# Patient Record
Sex: Male | Born: 1950 | Race: White | Hispanic: No | Smoking: Current every day smoker
Health system: Southern US, Community
[De-identification: ages and names within clinical notes are randomized; demographics above are authoritative.]

## PROBLEM LIST (undated history)

## (undated) DIAGNOSIS — D649 Anemia, unspecified: Secondary | ICD-10-CM

## (undated) DIAGNOSIS — J449 Chronic obstructive pulmonary disease, unspecified: Secondary | ICD-10-CM

## (undated) DIAGNOSIS — I313 Pericardial effusion (noninflammatory): Secondary | ICD-10-CM

## (undated) DIAGNOSIS — Z950 Presence of cardiac pacemaker: Secondary | ICD-10-CM

## (undated) DIAGNOSIS — I10 Essential (primary) hypertension: Secondary | ICD-10-CM

## (undated) DIAGNOSIS — I441 Atrioventricular block, second degree: Secondary | ICD-10-CM

## (undated) DIAGNOSIS — F1090 Alcohol use, unspecified, uncomplicated: Secondary | ICD-10-CM

## (undated) DIAGNOSIS — Z7289 Other problems related to lifestyle: Secondary | ICD-10-CM

## (undated) DIAGNOSIS — I4819 Other persistent atrial fibrillation: Secondary | ICD-10-CM

## (undated) DIAGNOSIS — R0600 Dyspnea, unspecified: Secondary | ICD-10-CM

## (undated) DIAGNOSIS — N182 Chronic kidney disease, stage 2 (mild): Secondary | ICD-10-CM

## (undated) DIAGNOSIS — Z720 Tobacco use: Secondary | ICD-10-CM

## (undated) DIAGNOSIS — I4892 Unspecified atrial flutter: Secondary | ICD-10-CM

## (undated) DIAGNOSIS — I3139 Other pericardial effusion (noninflammatory): Secondary | ICD-10-CM

## (undated) DIAGNOSIS — I5042 Chronic combined systolic (congestive) and diastolic (congestive) heart failure: Secondary | ICD-10-CM

## (undated) DIAGNOSIS — K259 Gastric ulcer, unspecified as acute or chronic, without hemorrhage or perforation: Secondary | ICD-10-CM

## (undated) DIAGNOSIS — K922 Gastrointestinal hemorrhage, unspecified: Secondary | ICD-10-CM

## (undated) DIAGNOSIS — R911 Solitary pulmonary nodule: Secondary | ICD-10-CM

## (undated) DIAGNOSIS — C801 Malignant (primary) neoplasm, unspecified: Secondary | ICD-10-CM

## (undated) DIAGNOSIS — Z789 Other specified health status: Secondary | ICD-10-CM

## (undated) DIAGNOSIS — R319 Hematuria, unspecified: Secondary | ICD-10-CM

## (undated) DIAGNOSIS — I428 Other cardiomyopathies: Secondary | ICD-10-CM

## (undated) HISTORY — DX: Tobacco use: Z72.0

## (undated) HISTORY — DX: Other problems related to lifestyle: Z72.89

## (undated) HISTORY — DX: Other specified health status: Z78.9

## (undated) HISTORY — DX: Other cardiomyopathies: I42.8

## (undated) HISTORY — DX: Other persistent atrial fibrillation: I48.19

## (undated) HISTORY — DX: Chronic kidney disease, stage 2 (mild): N18.2

## (undated) HISTORY — DX: Gastric ulcer, unspecified as acute or chronic, without hemorrhage or perforation: K25.9

## (undated) HISTORY — DX: Gastrointestinal hemorrhage, unspecified: K92.2

## (undated) HISTORY — DX: Hematuria, unspecified: R31.9

## (undated) HISTORY — DX: Unspecified atrial flutter: I48.92

## (undated) HISTORY — DX: Alcohol use, unspecified, uncomplicated: F10.90

## (undated) HISTORY — DX: Chronic combined systolic (congestive) and diastolic (congestive) heart failure: I50.42

## (undated) HISTORY — PX: TONSILLECTOMY: SUR1361

---

## 2002-08-10 ENCOUNTER — Encounter: Payer: Self-pay | Admitting: Family Medicine

## 2002-08-10 ENCOUNTER — Ambulatory Visit (HOSPITAL_COMMUNITY): Admission: RE | Admit: 2002-08-10 | Discharge: 2002-08-10 | Payer: Self-pay | Admitting: Family Medicine

## 2004-02-18 ENCOUNTER — Ambulatory Visit (HOSPITAL_COMMUNITY): Admission: RE | Admit: 2004-02-18 | Discharge: 2004-02-18 | Payer: Self-pay | Admitting: General Surgery

## 2004-02-20 ENCOUNTER — Ambulatory Visit (HOSPITAL_COMMUNITY): Admission: RE | Admit: 2004-02-20 | Discharge: 2004-02-20 | Payer: Self-pay | Admitting: Family Medicine

## 2005-06-16 ENCOUNTER — Inpatient Hospital Stay (HOSPITAL_COMMUNITY): Admission: EM | Admit: 2005-06-16 | Discharge: 2005-06-18 | Payer: Self-pay | Admitting: Emergency Medicine

## 2006-05-25 ENCOUNTER — Ambulatory Visit (HOSPITAL_COMMUNITY): Admission: RE | Admit: 2006-05-25 | Discharge: 2006-05-25 | Payer: Self-pay | Admitting: Family Medicine

## 2006-09-26 ENCOUNTER — Ambulatory Visit (HOSPITAL_COMMUNITY): Admission: RE | Admit: 2006-09-26 | Discharge: 2006-09-26 | Payer: Self-pay | Admitting: Family Medicine

## 2007-03-15 ENCOUNTER — Ambulatory Visit (HOSPITAL_COMMUNITY): Admission: RE | Admit: 2007-03-15 | Discharge: 2007-03-15 | Payer: Self-pay | Admitting: Family Medicine

## 2008-03-14 ENCOUNTER — Ambulatory Visit (HOSPITAL_COMMUNITY): Admission: RE | Admit: 2008-03-14 | Discharge: 2008-03-14 | Payer: Self-pay | Admitting: Family Medicine

## 2008-04-26 ENCOUNTER — Ambulatory Visit (HOSPITAL_COMMUNITY): Admission: RE | Admit: 2008-04-26 | Discharge: 2008-04-26 | Payer: Self-pay | Admitting: Family Medicine

## 2008-05-09 ENCOUNTER — Encounter: Admission: RE | Admit: 2008-05-09 | Discharge: 2008-05-09 | Payer: Self-pay | Admitting: Family Medicine

## 2008-05-23 ENCOUNTER — Encounter: Admission: RE | Admit: 2008-05-23 | Discharge: 2008-05-23 | Payer: Self-pay | Admitting: Family Medicine

## 2011-02-04 ENCOUNTER — Other Ambulatory Visit (HOSPITAL_COMMUNITY): Payer: Self-pay | Admitting: Urology

## 2011-02-04 ENCOUNTER — Ambulatory Visit (HOSPITAL_COMMUNITY)
Admission: RE | Admit: 2011-02-04 | Discharge: 2011-02-04 | Disposition: A | Discharge status: Home / Self Care | Payer: Self-pay | Source: Ambulatory Visit | Attending: Urology | Admitting: Urology

## 2011-02-04 DIAGNOSIS — Z01818 Encounter for other preprocedural examination: Secondary | ICD-10-CM

## 2011-04-23 NOTE — Consult Note (Signed)
NAME:  Shaun May, Shaun May NO.:  [id number]   MEDICAL RECORD NO.:  [id number]          PATIENT TYPE:  INP   LOCATION:  A213                          FACILITY:  [facility]   PHYSICIAN:  Jorja Loa, M.D.DATE OF BIRTH:  Apr 24, 1951   DATE OF CONSULTATION:  06/17/2005  DATE OF DISCHARGE:                                   CONSULTATION   REASON FOR CONSULTATION:  Renal insufficiency.   HISTORY OF PRESENT ILLNESS:  Mr. Upshaw is a 60 year old, Caucasian male who  has a history of hemochromatosis, history of hypertension and history of  abdominal pain who presented with complaints of nausea, vomiting, right-  sided abdominal pain and diarrhea.  During the admission, the patient's BUN  and creatinine has been elevated, hence, consult is called.  The patient  denies any previous history of renal insufficiency.  No history of kidney  stones.  As stated above, the patient with history of nausea, vomiting,  decreased appetite, some abdominal pain and diarrhea.   PAST MEDICAL HISTORY:  1.  History of hemochromatosis.  2.  History of hypertension.  3.  History of anxiety disorder.  4.  History of enlarged prostate.   SOCIAL HISTORY:  The patient smokes about two packs per day.  No history of  alcohol use.   ALLERGIES:  No known drug allergies.   MEDICATIONS:  1.  Levaquin 500 mg IV q.24h.  2.  Ativan 2 mg p.o. four times a day.  3.  Protonix 40 mg IV q.12h.  4.  IV fluid at 200 cc per hour.  5.  Dilaudid.   REVIEW OF SYSTEMS:  GASTROINTESTINAL:  This morning she feels okay.  The  patient with history of nausea, vomiting, but feels better now.  He also  complains of right-sided abdominal pain.  He has some constipation, but  starting yesterday, had some diarrhea.  Today, he has some loose stools.   PHYSICAL EXAMINATION:  GENERAL:  Alert and in no apparent distress.  VITAL SIGNS:  Temperature 98.1, pulse 66, blood pressure 86/46.  HEENT:  No conjunctivae or pallor.   Nonicteric.  Oral mucosa seems to be  dry.  NECK:  Supple.  No JVD.  CHEST:  Decreased breath sounds, otherwise seems to be clear.  No rales or  rhonchi.  No egophony.  HEART:  Regular rate and rhythm, no murmur.  ABDOMEN:  Soft, positive bowel sounds.  EXTREMITIES:  No edema.   LABORATORY DATA AND X-RAY FINDINGS:  White blood cell count 18.2, hemoglobin  14.8, hematocrit 42.4, platelets of 195.  Sodium 137, potassium 3.7, BUN 40,  creatinine 3.7.  Total bilirubin is 2.6, bilirubin 0.4, indirect bilirubin  2.2, SGOT 44, SGPT 45, albumin 4.1, calcium 8.1.  UA with specific gravity  less than 1.01.  At this moment, everything is negative.  Culture seems to  be pending.  Blood culture from yesterday with no growth.   ASSESSMENT:  1.  Renal insufficiency.  At this moment, it seems to be acute.  Etiology      for renal insufficiency could be secondary to prerenal  syndrome,      however, acute tubular necrosis and other etiologies cannot be ruled      out.  2.  History of hemochromatosis.  He has had this problem for some time.      Hemoglobin and hematocrit seems to be reasonable.  3.  Elevated liver function tests probably associated with hemochromatosis,      i.e. secondary to iron load, but he does not have any diabetes or any      other signs.  Other etiologies are still possible.  4.  History of abdominal side, right-sided, may be related to his liver.  5.  Hypotension.  At this moment, probably in view of high white blood cell      count, the patient may have infection.  The source is not clear, but      gastrointestinal source needs to probably entertained.   RECOMMENDATIONS:  I agree with hydration in this woman.  Will continue with  present management.  If patient does not have any ultrasounds, we probably  will need to do an ultrasound of the kidneys to rule out obstruction.  Will  continue other medications as before and will follow patient.       BB/MEDQ  D:  06/17/2005   T:  06/17/2005  Job:  [id number]

## 2011-04-23 NOTE — H&P (Signed)
NAME:  Shaun May, Shaun May NO.:  [id number]   MEDICAL RECORD NO.:  [id number]                  PATIENT TYPE:   LOCATION:                                       FACILITY:   PHYSICIAN:  Dalia Heading, M.D.               DATE OF BIRTH:  Oct 27, 1951   DATE OF ADMISSION:  DATE OF DISCHARGE:                                HISTORY & PHYSICAL   CHIEF COMPLAINT:  Right-sided abdominal pain, need for screening  colonoscopy.   HISTORY OF PRESENT ILLNESS:  The patient is a 60 year old white male who is  referred for endoscopic evaluation.  He needs colonoscopy for screening and  for right-side abdominal pain.  No weight loss, nausea, vomiting, diarrhea,  constipation, melena, or hematochezia have been noted.  He has never had a  colonoscopy.  There is no family history of colon carcinoma.   PAST MEDICAL HISTORY:  Includes hypertension.   PAST SURGICAL HISTORY:  Tonsillectomy, prostate biopsy.   CURRENT MEDICATIONS:  Ziac, Advair.   ALLERGIES:  No known drug allergies.   REVIEW OF SYSTEMS:  The patient smokes two packs of cigarettes a day.  He  drinks alcohol daily.  No other cardiopulmonary difficulties are noted.   PHYSICAL EXAMINATION:  GENERAL:  The patient is a well-developed, well-  nourished white male in no acute distress.  VITAL SIGNS:  He is afebrile and vital signs are stable.  LUNGS:  Clear to auscultation with equal breath sounds bilaterally.  HEART:  Reveals a respiratory rate without S3, S4, or murmurs.  ABDOMEN:  Soft, nontender, nondistended.  No hepatosplenomegaly, masses, or  hernias are identified.  RECTAL:  Deferred to the procedure.   IMPRESSION:  Abdominal pain, need for screening colonoscopy.   PLAN:  The patient is scheduled for a colonoscopy on February 18, 2004.  The  risks and benefits of the procedure including bleeding and perforation were  fully explained to the patient who gave informed consent.      ___________________________________________                                         Dalia Heading, M.D.   MAJ/MEDQ  D:  02/06/2004  T:  02/06/2004  Job:  [id number]   cc:   Patrica Duel, M.D.  [address]  [place]  [place] [address]  Fax: [phone]

## 2011-04-23 NOTE — Discharge Summary (Signed)
NAME:  TEMPLE, SPORER NO.:  [id number]   MEDICAL RECORD NO.:  [id number]          PATIENT TYPE:  INP   LOCATION:  A213                          FACILITY:  [facility]   PHYSICIAN:  Madelin Rear. Sherwood Gambler, MD  DATE OF BIRTH:  1951/09/18   DATE OF ADMISSION:  06/16/2005  DATE OF DISCHARGE:  07/14/2006LH                                 DISCHARGE SUMMARY   DISCHARGE DIAGNOSES:  1.  Gastroenteritis.  2.  Dehydration.  3.  Hemochromatosis.  4.  Chronic renal insufficiency.   DISCHARGE MEDICATIONS:  1.  Lopressor 25 mg p.o. b.i.d.  2.  Protonix 40 mg p.o. b.i.d.  3.  Ativan p.r.n. 2 mg t.i.d.   HOSPITAL COURSE:  The patient was admitted with intractable vomiting,  hypotension and dehydration.  He responded well clinically and by physical  examination with IV hydration.  He was seen in consultation by  gastroenterology as well as renal for mild renal insufficiency.  Workup  included a CT scan which showed no evidence of obstruction or biliary  obstruction.  Discharged asymptomatic for followup in office.       LJF/MEDQ  D:  07/10/2005  T:  07/10/2005  Job:  [id number]

## 2011-04-23 NOTE — H&P (Signed)
NAME:  Shaun May, BACHA NO.:  [id number]   MEDICAL RECORD NO.:  [id number]          PATIENT TYPE:  INP   LOCATION:  A213                          FACILITY:  [facility]   PHYSICIAN:  Madelin Rear. Sherwood Gambler, MD  DATE OF BIRTH:  August 05, 1951   DATE OF ADMISSION:  06/16/2005  DATE OF DISCHARGE:  LH                                HISTORY & PHYSICAL   CHIEF COMPLAINT:  Abdominal pain, diarrhea.   HISTORY OF PRESENT ILLNESS:  The patient had several days of progressively  increasing diarrhea, nagging abdominal pain, although not severe, and  emesis.  He denied any hematemesis, hematochezia or melena.  He had no fever  or regular chills.  He has had episodes of some orthostatic dizziness, and  flushed sensation, but no rigors and no fever.   PAST MEDICAL HISTORY:  1.  Hemachromatosis, chronically phlebotomized every two months.  2.  Status post colonoscopy recently with Dr. Lovell Sheehan that he reports to me      as negative.  He had colonoscopy for screening purposes.  3.  Hypertension, approximately June 03, 2005, he was started on Ace      inhibitor therapy with Enalapril.  He may have had some GI intolerance      to this.   SOCIAL HISTORY:  Married, lives with wife.  No alcohol or other drug use.   FAMILY HISTORY:  Noncontributory.   REVIEW OF SYSTEMS:  Under HPI.  All else negative.   PHYSICAL EXAMINATION:  GENERAL APPEARANCE:  He is noted to be hypotensive  without tachycardia.  He appears conversant and well perfusing, however.  HEENT:  No JVD or adenopathy.  NECK:  Supple.  CHEST:  Clear.  CARDIAC:  Regular rate and rhythm without murmurs, gallops, rubs.  ABDOMEN:  Positive bowel sounds.  No organomegaly or masses.  He does have  subjective tenderness in the right upper quadrant, right mesogastric area  and right lower quadrant.  There is no guarding or rebound, however.  EXTREMITIES:  Without cyanosis, clubbing or edema.  NEUROLOGICAL:  Nonfocal.   LABORATORY DATA:   Obtained upon presentation to the emergency department and  revealed a 25.2 white count with positive left shift.  He was also  polycythemic, presumably secondary to contraction/dehydration of 18.9 g  percent.  Platelet count pumps were noted and counted.  Electrolytes  revealed mild hyponatremia at 131 and mildly elevated BUN at 26 and a new  onset of increased creatinine at 2.6.  His bilirubin was elevated at 2.6,  predominantly and direct.  Alk-phos normal, mild elevation of O2 and PT at  44 and 45.  Serum albumin was normal.  Calcium was normal.  Amylase and  lipase were normal.  Urinalysis was not obtained.  Radiographs, i.e. and  obstruction series, revealed nonspecific gas pattern with possible low grade  early small bowel obstruction not excluded.  No pneumoperitoneum.   IMPRESSION:  1.  Abdominal pain with diarrhea and multiple metabolic abnormalities.  He      is going to require workup of his gallbladder due to the abnormal LFT's.  He has an indirect hyperbilirubinemia that is not explained.  Hepatitis      serology will be obtained.  This may be secondary to his      hemachromatosis.  GI consultation will be sought.  Due to his elevated      creatinine a CT with contrast cannot be obtained, but a plain one will      be.  Renal function will be monitored off ACE inhibitor as a possible      contributing factor.  Will rule out obstructive uropathy as well.  Due      to his elevated white count suggesting pending sepsis, an abdominal pain      will be covered with quinolone and anaerobic coverage with Flagyl      dependent of anatomic studies.  2.  Hypertension.  Obviously not a problem with his blood pressure at      present.  He will be monitored and acutely intervened as necessary.  3.  Dehydration with contraction/secondary polycythemia.  Will obtain an      overnight oximetry, aggressive IV hydration to be pursued.       LJF/MEDQ  D:  06/17/2005  T:  06/17/2005   Job:  [id number]

## 2014-07-03 ENCOUNTER — Ambulatory Visit (HOSPITAL_COMMUNITY)
Admission: RE | Admit: 2014-07-03 | Discharge: 2014-07-03 | Disposition: A | Discharge status: Home / Self Care | Payer: BC Managed Care – PPO | Source: Ambulatory Visit | Attending: Pulmonary Disease | Admitting: Pulmonary Disease

## 2014-07-03 ENCOUNTER — Other Ambulatory Visit (HOSPITAL_COMMUNITY): Payer: Self-pay | Admitting: Pulmonary Disease

## 2014-07-03 DIAGNOSIS — R05 Cough: Secondary | ICD-10-CM

## 2014-07-03 DIAGNOSIS — R059 Cough, unspecified: Secondary | ICD-10-CM

## 2014-09-26 ENCOUNTER — Other Ambulatory Visit (HOSPITAL_COMMUNITY): Payer: Self-pay | Admitting: Pulmonary Disease

## 2014-09-26 DIAGNOSIS — R197 Diarrhea, unspecified: Secondary | ICD-10-CM

## 2014-09-26 DIAGNOSIS — R1011 Right upper quadrant pain: Secondary | ICD-10-CM

## 2014-09-26 DIAGNOSIS — R110 Nausea: Secondary | ICD-10-CM

## 2014-09-30 ENCOUNTER — Ambulatory Visit (HOSPITAL_COMMUNITY)
Admission: RE | Admit: 2014-09-30 | Discharge: 2014-09-30 | Disposition: A | Discharge status: Home / Self Care | Payer: BC Managed Care – PPO | Source: Ambulatory Visit | Attending: Pulmonary Disease | Admitting: Pulmonary Disease

## 2014-09-30 ENCOUNTER — Ambulatory Visit (HOSPITAL_COMMUNITY): Payer: BC Managed Care – PPO

## 2014-09-30 DIAGNOSIS — I700 Atherosclerosis of aorta: Secondary | ICD-10-CM | POA: Insufficient documentation

## 2014-09-30 DIAGNOSIS — R197 Diarrhea, unspecified: Secondary | ICD-10-CM | POA: Diagnosis not present

## 2014-09-30 DIAGNOSIS — G8929 Other chronic pain: Secondary | ICD-10-CM | POA: Diagnosis not present

## 2014-09-30 DIAGNOSIS — R1011 Right upper quadrant pain: Secondary | ICD-10-CM | POA: Insufficient documentation

## 2014-09-30 DIAGNOSIS — K760 Fatty (change of) liver, not elsewhere classified: Secondary | ICD-10-CM | POA: Insufficient documentation

## 2014-09-30 DIAGNOSIS — N281 Cyst of kidney, acquired: Secondary | ICD-10-CM | POA: Insufficient documentation

## 2014-09-30 DIAGNOSIS — R110 Nausea: Secondary | ICD-10-CM | POA: Insufficient documentation

## 2014-09-30 MED ORDER — IOHEXOL 300 MG/ML  SOLN
100.0000 mL | Freq: Once | INTRAMUSCULAR | Status: AC | PRN
  Administered 2014-09-30: 100 mL via INTRAVENOUS

## 2014-09-30 MED ORDER — SODIUM CHLORIDE 0.9 % IJ SOLN
INTRAMUSCULAR | Status: AC
  Filled 2014-09-30: qty 45

## 2014-09-30 MED ORDER — SODIUM CHLORIDE 0.9 % IJ SOLN
INTRAMUSCULAR | Status: AC
  Filled 2014-09-30: qty 500

## 2014-10-17 ENCOUNTER — Ambulatory Visit (INDEPENDENT_AMBULATORY_CARE_PROVIDER_SITE_OTHER): Payer: BC Managed Care – PPO | Admitting: Otolaryngology

## 2014-10-17 DIAGNOSIS — H903 Sensorineural hearing loss, bilateral: Secondary | ICD-10-CM

## 2014-10-17 DIAGNOSIS — H6123 Impacted cerumen, bilateral: Secondary | ICD-10-CM

## 2014-10-17 DIAGNOSIS — H9313 Tinnitus, bilateral: Secondary | ICD-10-CM

## 2014-11-18 ENCOUNTER — Other Ambulatory Visit (HOSPITAL_COMMUNITY): Payer: Self-pay | Admitting: Pulmonary Disease

## 2014-11-18 DIAGNOSIS — K760 Fatty (change of) liver, not elsewhere classified: Secondary | ICD-10-CM

## 2014-11-22 ENCOUNTER — Ambulatory Visit (HOSPITAL_COMMUNITY): Admission: RE | Admit: 2014-11-22 | Payer: BC Managed Care – PPO | Source: Ambulatory Visit

## 2015-10-23 ENCOUNTER — Ambulatory Visit (INDEPENDENT_AMBULATORY_CARE_PROVIDER_SITE_OTHER): Payer: Self-pay | Admitting: Otolaryngology

## 2015-10-25 ENCOUNTER — Other Ambulatory Visit: Payer: Self-pay | Admitting: Cardiology

## 2015-10-25 ENCOUNTER — Emergency Department (HOSPITAL_COMMUNITY): Payer: 59

## 2015-10-25 ENCOUNTER — Encounter (HOSPITAL_COMMUNITY): Payer: Self-pay | Admitting: Emergency Medicine

## 2015-10-25 ENCOUNTER — Inpatient Hospital Stay (HOSPITAL_COMMUNITY)
Admission: EM | Admit: 2015-10-25 | Discharge: 2015-10-29 | DRG: 244 | Disposition: A | Discharge status: Home / Self Care | Payer: 59 | Attending: Internal Medicine | Admitting: Internal Medicine

## 2015-10-25 DIAGNOSIS — Z720 Tobacco use: Secondary | ICD-10-CM

## 2015-10-25 DIAGNOSIS — F1020 Alcohol dependence, uncomplicated: Secondary | ICD-10-CM | POA: Diagnosis present

## 2015-10-25 DIAGNOSIS — F17210 Nicotine dependence, cigarettes, uncomplicated: Secondary | ICD-10-CM | POA: Diagnosis present

## 2015-10-25 DIAGNOSIS — I160 Hypertensive urgency: Secondary | ICD-10-CM | POA: Diagnosis not present

## 2015-10-25 DIAGNOSIS — F410 Panic disorder [episodic paroxysmal anxiety] without agoraphobia: Secondary | ICD-10-CM | POA: Diagnosis not present

## 2015-10-25 DIAGNOSIS — I452 Bifascicular block: Secondary | ICD-10-CM | POA: Diagnosis not present

## 2015-10-25 DIAGNOSIS — R001 Bradycardia, unspecified: Secondary | ICD-10-CM | POA: Diagnosis not present

## 2015-10-25 DIAGNOSIS — R42 Dizziness and giddiness: Secondary | ICD-10-CM

## 2015-10-25 DIAGNOSIS — I441 Atrioventricular block, second degree: Secondary | ICD-10-CM | POA: Diagnosis present

## 2015-10-25 DIAGNOSIS — R7989 Other specified abnormal findings of blood chemistry: Secondary | ICD-10-CM | POA: Diagnosis present

## 2015-10-25 DIAGNOSIS — I10 Essential (primary) hypertension: Secondary | ICD-10-CM | POA: Diagnosis not present

## 2015-10-25 DIAGNOSIS — Z950 Presence of cardiac pacemaker: Secondary | ICD-10-CM

## 2015-10-25 HISTORY — DX: Atrioventricular block, second degree: I44.1

## 2015-10-25 HISTORY — DX: Hemochromatosis, unspecified: E83.119

## 2015-10-25 HISTORY — DX: Essential (primary) hypertension: I10

## 2015-10-25 LAB — COMPREHENSIVE METABOLIC PANEL
ALT: 77 U/L — ABNORMAL HIGH (ref 17–63)
ANION GAP: 12 (ref 5–15)
AST: 74 U/L — ABNORMAL HIGH (ref 15–41)
Albumin: 4.3 g/dL (ref 3.5–5.0)
Alkaline Phosphatase: 50 U/L (ref 38–126)
BUN: 12 mg/dL (ref 6–20)
CALCIUM: 9.6 mg/dL (ref 8.9–10.3)
CHLORIDE: 100 mmol/L — AB (ref 101–111)
CO2: 28 mmol/L (ref 22–32)
Creatinine, Ser: 0.7 mg/dL (ref 0.61–1.24)
Glucose, Bld: 100 mg/dL — ABNORMAL HIGH (ref 65–99)
Potassium: 4.2 mmol/L (ref 3.5–5.1)
SODIUM: 140 mmol/L (ref 135–145)
Total Bilirubin: 1.5 mg/dL — ABNORMAL HIGH (ref 0.3–1.2)
Total Protein: 7.8 g/dL (ref 6.5–8.1)

## 2015-10-25 LAB — CBC WITH DIFFERENTIAL/PLATELET
Basophils Absolute: 0 10*3/uL (ref 0.0–0.1)
Basophils Relative: 0 %
EOS ABS: 0.1 10*3/uL (ref 0.0–0.7)
EOS PCT: 2 %
HCT: 46.1 % (ref 39.0–52.0)
Hemoglobin: 15.9 g/dL (ref 13.0–17.0)
LYMPHS ABS: 1.2 10*3/uL (ref 0.7–4.0)
Lymphocytes Relative: 17 %
MCH: 33.5 pg (ref 26.0–34.0)
MCHC: 34.5 g/dL (ref 30.0–36.0)
MCV: 97.1 fL (ref 78.0–100.0)
MONO ABS: 0.4 10*3/uL (ref 0.1–1.0)
MONOS PCT: 5 %
Neutro Abs: 5.4 10*3/uL (ref 1.7–7.7)
Neutrophils Relative %: 76 %
PLATELETS: 229 10*3/uL (ref 150–400)
RBC: 4.75 MIL/uL (ref 4.22–5.81)
RDW: 13 % (ref 11.5–15.5)
WBC: 7.2 10*3/uL (ref 4.0–10.5)

## 2015-10-25 LAB — TROPONIN I

## 2015-10-25 LAB — TSH: TSH: 0.599 u[IU]/mL (ref 0.350–4.500)

## 2015-10-25 LAB — BRAIN NATRIURETIC PEPTIDE: B Natriuretic Peptide: 95 pg/mL (ref 0.0–100.0)

## 2015-10-25 MED ORDER — PNEUMOCOCCAL VAC POLYVALENT 25 MCG/0.5ML IJ INJ
0.5000 mL | INJECTION | INTRAMUSCULAR | Status: DC
  Filled 2015-10-25 (×2): qty 0.5

## 2015-10-25 MED ORDER — NICOTINE 21 MG/24HR TD PT24
21.0000 mg | MEDICATED_PATCH | TRANSDERMAL | Status: DC
  Administered 2015-10-25 – 2015-10-28 (×4): 21 mg via TRANSDERMAL
  Filled 2015-10-25 (×4): qty 1

## 2015-10-25 MED ORDER — POTASSIUM CHLORIDE CRYS ER 20 MEQ PO TBCR
20.0000 meq | EXTENDED_RELEASE_TABLET | Freq: Every day | ORAL | Status: DC
  Administered 2015-10-26 – 2015-10-29 (×4): 20 meq via ORAL
  Filled 2015-10-25 (×4): qty 1

## 2015-10-25 MED ORDER — VITAMIN B-1 100 MG PO TABS
100.0000 mg | ORAL_TABLET | Freq: Every day | ORAL | Status: DC
  Administered 2015-10-25 – 2015-10-29 (×5): 100 mg via ORAL
  Filled 2015-10-25 (×5): qty 1

## 2015-10-25 MED ORDER — AMLODIPINE BESYLATE 10 MG PO TABS
10.0000 mg | ORAL_TABLET | Freq: Every day | ORAL | Status: DC
  Administered 2015-10-26: 10 mg via ORAL
  Filled 2015-10-25: qty 1

## 2015-10-25 MED ORDER — ENOXAPARIN SODIUM 40 MG/0.4ML ~~LOC~~ SOLN
40.0000 mg | SUBCUTANEOUS | Status: DC
  Administered 2015-10-25 – 2015-10-27 (×3): 40 mg via SUBCUTANEOUS
  Filled 2015-10-25 (×3): qty 0.4

## 2015-10-25 MED ORDER — DIPHENHYDRAMINE HCL 25 MG PO CAPS
25.0000 mg | ORAL_CAPSULE | Freq: Once | ORAL | Status: AC
  Administered 2015-10-25: 25 mg via ORAL
  Filled 2015-10-25: qty 1

## 2015-10-25 MED ORDER — ALBUTEROL SULFATE (2.5 MG/3ML) 0.083% IN NEBU: 2.5000 mg | INHALATION_SOLUTION | RESPIRATORY_TRACT | Status: DC | PRN

## 2015-10-25 MED ORDER — LORAZEPAM 1 MG PO TABS: 1.0000 mg | ORAL_TABLET | Freq: Four times a day (QID) | ORAL | Status: AC | PRN

## 2015-10-25 MED ORDER — LORAZEPAM 2 MG/ML IJ SOLN: 1.0000 mg | Freq: Four times a day (QID) | INTRAMUSCULAR | Status: AC | PRN

## 2015-10-25 MED ORDER — LISINOPRIL 40 MG PO TABS
40.0000 mg | ORAL_TABLET | Freq: Every day | ORAL | Status: DC
  Administered 2015-10-26 – 2015-10-29 (×4): 40 mg via ORAL
  Filled 2015-10-25 (×4): qty 1

## 2015-10-25 MED ORDER — SODIUM CHLORIDE 0.9 % IJ SOLN
3.0000 mL | Freq: Two times a day (BID) | INTRAMUSCULAR | Status: DC
  Administered 2015-10-25 – 2015-10-28 (×6): 3 mL via INTRAVENOUS
  Administered 2015-10-28: 10 mL via INTRAVENOUS
  Administered 2015-10-28: 3 mL via INTRAVENOUS

## 2015-10-25 MED ORDER — LORAZEPAM 1 MG PO TABS
2.0000 mg | ORAL_TABLET | Freq: Four times a day (QID) | ORAL | Status: DC
  Administered 2015-10-25 – 2015-10-29 (×13): 2 mg via ORAL
  Filled 2015-10-25 (×13): qty 2

## 2015-10-25 MED ORDER — HYDRALAZINE HCL 20 MG/ML IJ SOLN: 5.0000 mg | Freq: Four times a day (QID) | INTRAMUSCULAR | Status: DC | PRN

## 2015-10-25 MED ORDER — ACETAMINOPHEN 325 MG PO TABS: 650.0000 mg | ORAL_TABLET | Freq: Four times a day (QID) | ORAL | Status: DC | PRN

## 2015-10-25 MED ORDER — ADULT MULTIVITAMIN W/MINERALS CH
1.0000 | ORAL_TABLET | Freq: Every day | ORAL | Status: DC
Start: 2015-10-25 — End: 2015-10-29
  Administered 2015-10-25 – 2015-10-29 (×5): 1 via ORAL
  Filled 2015-10-25 (×5): qty 1

## 2015-10-25 MED ORDER — HYDRALAZINE HCL 25 MG PO TABS
25.0000 mg | ORAL_TABLET | Freq: Three times a day (TID) | ORAL | Status: DC
  Administered 2015-10-25 – 2015-10-28 (×10): 25 mg via ORAL
  Filled 2015-10-25 (×12): qty 1

## 2015-10-25 MED ORDER — ACETAMINOPHEN 650 MG RE SUPP: 650.0000 mg | Freq: Four times a day (QID) | RECTAL | Status: DC | PRN

## 2015-10-25 MED ORDER — THIAMINE HCL 100 MG/ML IJ SOLN: 100.0000 mg | Freq: Every day | INTRAMUSCULAR | Status: DC

## 2015-10-25 MED ORDER — HYDROCODONE-ACETAMINOPHEN 10-325 MG PO TABS
1.0000 | ORAL_TABLET | Freq: Four times a day (QID) | ORAL | Status: DC | PRN
  Administered 2015-10-29: 1 via ORAL
  Filled 2015-10-25: qty 1

## 2015-10-25 MED ORDER — FOLIC ACID 1 MG PO TABS
1.0000 mg | ORAL_TABLET | Freq: Every day | ORAL | Status: DC
  Administered 2015-10-25 – 2015-10-29 (×5): 1 mg via ORAL
  Filled 2015-10-25 (×5): qty 1

## 2015-10-25 NOTE — H&P (Addendum)
History and Physical  Shaun May HYW:[id number] DOB: May 27, 1951 DOA: 10/25/2015  Referring physician: Dr. Merrily Pew, EDP PCP: Alonza Bogus, MD  Outpatient Specialists:  1. None  Chief Complaint: High blood pressure, low heart rate and facial flushing.  HPI: Shaun May is a 64 y.o. male with history of HTN, hemochromatosis and frequent phlebotomies, anxiety, tobacco abuse and alcohol dependence, presented to the [facility] ED on 10/25/15 with above complaints. Approximately 3 weeks back when he had gone to [employer] to donate blood, they informed him that he had very high blood pressure. Since then he has followed up with his PCP who has been adjusting his medications. He claims compliance with his medications. Yesterday afternoon patient felt slightly dizzy but denied any other complaints. This was transient and resolved. He took his antihypertensives at approximately [facility]. He complained of some facial flushing and mild transient dizziness today. He checked his blood pressure and hour after taking his medications and it was 223/93 mmHg and a heart rate of 45/m. He was concerned and hence presented to the ED. He denies headache, sore throat, facial stuffiness, earache, chest pain, dyspnea, palpitations, nausea, vomiting, diarrhea, abdominal pain, tingling, numbness or asymmetrical weakness. In the ED, his initial blood pressure was 213/64 but came down to SBP of 150s without medications. Heart rate is consistently in the 30s on telemetry. EDP discussed patient's care with [facility] cardiologist on-call who recommended observation on telemetry. Hospitalist M.D. was called for admission. I discussed with the cardiologist on call at [facility] and plan to transfer patient to [facility] telemetry for close monitoring and evaluation by cardiology given 2:1 heart block on EKG and telemetry.   Review of Systems: All systems reviewed and apart from history of presenting illness, are negative. Patient's  girlfriend states that patient does have snoring spells at night along with breath-holding spells.  Past Medical History  Diagnosis Date  . Hypertension   . Hemochromatosis    History reviewed. No pertinent past surgical history. Social History:  reports that he has been smoking Cigarettes.  He has been smoking about 2.50 packs per day. He does not have any smokeless tobacco history on file. He reports that he drinks about 12.6 oz of alcohol per week. He reports that he does not use illicit drugs. Patient states that he has been smoking 2-1/2 pack of cigarettes per day since he was a teenager. Apparently drinks up to 3 mixed drinks which has scotch in it, every night. No drug abuse reported.  No Known Allergies  Family History  Problem Relation Age of Onset  . Diabetes Other     Prior to Admission medications   Medication Sig Start Date End Date Taking? Authorizing Provider  amLODipine (NORVASC) 10 MG tablet Take 1 tablet by mouth daily. 10/08/15  Yes Historical Provider, MD  HYDROcodone-acetaminophen (NORCO) 10-325 MG tablet Take 1 tablet by mouth every 6 (six) hours as needed for moderate pain.   Yes Historical Provider, MD  lisinopril (PRINIVIL,ZESTRIL) 20 MG tablet Take 2 tablets by mouth daily. 10/14/15  Yes Historical Provider, MD  LORazepam (ATIVAN) 2 MG tablet Take 1 tablet by mouth 4 (four) times daily.  10/01/15  Yes Historical Provider, MD  potassium chloride SA (K-DUR,KLOR-CON) 20 MEQ tablet Take 1 tablet by mouth daily. 10/14/15  Yes Historical Provider, MD   Physical Exam: Filed Vitals:   10/25/15 1530 10/25/15 1545 10/25/15 1600 10/25/15 1630  BP: 153/85  150/82 171/87  Pulse:  70 66  66  Temp:      TempSrc:      Resp: '20 14 15 19  '$ Height:      Weight:      SpO2:  97% 95% 95%   temperature: 98.74F.   General exam: Moderately built and nourished pleasant middle-aged male patient, lying comfortably supine on the gurney in no obvious distress.  Head, eyes and ENT:  Nontraumatic and normocephalic. Pupils equally reacting to light and accommodation. Oral mucosa moist.  Neck: Supple. No JVD, carotid bruit or thyromegaly.  Lymphatics: No lymphadenopathy.  Respiratory system: Clear to auscultation. No increased work of breathing. Spider nevi + on anterior chest wall.  Cardiovascular system: S1 and S2 heard, RRR. No JVD, murmurs, gallops, clicks or pedal edema. Telemetry: Heart rate consistently in the 30s with 2:1 block.  Gastrointestinal system: Abdomen is nondistended, soft and nontender. Normal bowel sounds heard. No organomegaly or masses appreciated.  Central nervous system: Alert and oriented. No focal neurological deficits.  Extremities: Symmetric 5 x 5 power. Peripheral pulses symmetrically felt.   Skin: No rashes or acute findings.  Musculoskeletal system: Negative exam.  Psychiatry: Pleasant and cooperative.   Labs on Admission:  Basic Metabolic Panel:  Recent Labs Lab 10/25/15 1418  NA 140  K 4.2  CL 100*  CO2 28  GLUCOSE 100*  BUN 12  CREATININE 0.70  CALCIUM 9.6   Liver Function Tests:  Recent Labs Lab 10/25/15 1418  AST 74*  ALT 77*  ALKPHOS 50  BILITOT 1.5*  PROT 7.8  ALBUMIN 4.3   No results for input(s): LIPASE, AMYLASE in the last 168 hours. No results for input(s): AMMONIA in the last 168 hours. CBC:  Recent Labs Lab 10/25/15 1418  WBC 7.2  NEUTROABS 5.4  HGB 15.9  HCT 46.1  MCV 97.1  PLT 229   Cardiac Enzymes:  Recent Labs Lab 10/25/15 1418  TROPONINI <0.03    BNP (last 3 results) No results for input(s): PROBNP in the last 8760 hours. CBG: No results for input(s): GLUCAP in the last 168 hours.  Radiological Exams on Admission: Dg Chest 2 View  10/25/2015  CLINICAL DATA:  Pt stated he felt flushed and his blood pressure was high today EXAM: CHEST  2 VIEW COMPARISON:  07/03/2014 FINDINGS: Lateral view degraded by patient arm position. Moderate thoracic spondylosis. Numerous leads and  wires project over the chest. Midline trachea. Borderline cardiomegaly. Atherosclerosis in the transverse aorta. No pleural effusion or pneumothorax. Clear lungs. IMPRESSION: No acute cardiopulmonary disease. Aortic atherosclerosis. Electronically Signed   By: Abigail Miyamoto M.D.   On: 10/25/2015 14:33   Ct Head Wo Contrast  10/25/2015  CLINICAL DATA:  Headache.  Elevated blood pressure. EXAM: CT HEAD WITHOUT CONTRAST TECHNIQUE: Contiguous axial images were obtained from the base of the skull through the vertex without intravenous contrast. COMPARISON:  None FINDINGS: Prominence of the sulci and ventricles identified. Mild nonspecific low attenuation within the subcortical and periventricular white matter identified compatible with chronic microvascular disease. No acute cortical infarct, hemorrhage, or mass lesion ispresent. Ventricles are of normal size. No significant extra-axial fluid collection is present. Moderate mucosal thickening involving the maxillaries sinuses, sphenoid sinus, ethmoid air cells noted. Mastoid air cells are clear. IMPRESSION: 1. No acute intracranial abnormalities. 2. Sinus opacification. Electronically Signed   By: Kerby Moors M.D.   On: 10/25/2015 14:48    EKG: Independently reviewed. 2:1 AV block with heart rate 36 bpm, RBBB, LAFB and QTC 408 ms.  Assessment/Plan Principal Problem:  Bradycardia Active Problems:   Heart block AV second degree   Accelerated hypertension   Hemochromatosis   Tobacco abuse   Alcohol dependence (HCC)   Second degree AV block   Bradycardia/second degree AV block - Unclear if his dizziness is related to this or due to hypertension. - Patient not on any negative chronotropic medications - States that he took NyQuil bedtime last night. - Transfer to [facility] telemetry for close monitoring and evaluation by cardiology - Check TSH and 2-D echo - Avoid rate limiting medications - Hemochromatosis may contribute.  Accelerated  hypertension - Continue lisinopril and amlodipine at home dose. - add oral hydralazine 25 MG 3 times a day and when necessary IV hydralazine  Dizziness - ? Related to hypertension versus bradycardia versus alcohol dependence - CT head negative. No focal deficits.  Anxiety - Patient on scheduled Ativan at home-unclear how long he has been on this.  Tobacco abuse - Cessation counseled. Patient requests a nicotine patch.  Alcohol dependence - CIWA protocol   Hemochromatosis - Patient undergoes periodic phlebotomies every 3 months. Need to check Serum Ferritin levels.  R/O OSA - h/o sleep snoring and apneic spells. Will need OP work up with sleep study.   DVT prophylaxis: Subcutaneous Lovenox Code Status: Full  Family Communication: Discussed with patient's girlfriend and patient's father at bedside.  Disposition Plan: Transfer to [facility]. Discussed with the accepting [facility] M.D.   Time spent: 67 minutes  Kaire Stary, MD, FACP, FHM. [facility] Pager [phone]  If 7PM-7AM, please contact night-coverage [email or web address] Password [email or web address] 10/25/2015, 5:16 PM

## 2015-10-25 NOTE — ED Notes (Signed)
Report given to [facility] on [place] [facility] at [facility].

## 2015-10-25 NOTE — ED Provider Notes (Signed)
CSN: [id number]     Arrival date & time 10/25/15  1324 History   First MD Initiated Contact with Patient 10/25/15 1354     Chief Complaint  Patient presents with  . Hypertension  . Bradycardia     (Consider location/radiation/quality/duration/timing/severity/associated sxs/prior Treatment) Patient is a 64 y.o. male presenting with hypertension.  Hypertension This is a chronic problem. The current episode started more than 1 week ago. The problem occurs constantly. Associated symptoms include headaches. Pertinent negatives include no chest pain, no abdominal pain and no shortness of breath. Nothing aggravates the symptoms. Nothing relieves the symptoms. He has tried nothing for the symptoms. The treatment provided no relief.    Past Medical History  Diagnosis Date  . Hypertension   . Hemochromatosis    History reviewed. No pertinent past surgical history. Family History  Problem Relation Age of Onset  . Diabetes Other    Social History  Substance Use Topics  . Smoking status: Current Every Day Smoker -- 2.50 packs/day    Types: Cigarettes  . Smokeless tobacco: None  . Alcohol Use: 12.6 oz/week    21 Shots of liquor per week     Comment: nightly    Review of Systems  Constitutional: Negative for fever and chills.  HENT: Negative for congestion and ear pain.   Eyes: Negative for pain.  Respiratory: Negative for choking, chest tightness and shortness of breath.   Cardiovascular: Negative for chest pain.  Gastrointestinal: Negative for abdominal pain.  Endocrine: Negative for polydipsia and polyuria.  Neurological: Positive for dizziness, weakness, light-headedness and headaches. Negative for seizures, syncope, speech difficulty and numbness.  All other systems reviewed and are negative.     Allergies  Review of patient's allergies indicates no known allergies.  Home Medications   Prior to Admission medications   Medication Sig Start Date End Date Taking?  Authorizing Provider  amLODipine (NORVASC) 10 MG tablet Take 1 tablet by mouth daily. 10/08/15  Yes Historical Provider, MD  HYDROcodone-acetaminophen (NORCO) 10-325 MG tablet Take 1 tablet by mouth every 6 (six) hours as needed for moderate pain.   Yes Historical Provider, MD  lisinopril (PRINIVIL,ZESTRIL) 20 MG tablet Take 2 tablets by mouth daily. 10/14/15  Yes Historical Provider, MD  LORazepam (ATIVAN) 2 MG tablet Take 1 tablet by mouth 4 (four) times daily.  10/01/15  Yes Historical Provider, MD  potassium chloride SA (K-DUR,KLOR-CON) 20 MEQ tablet Take 1 tablet by mouth daily. 10/14/15  Yes Historical Provider, MD   BP 167/69 mmHg  Pulse 82  Temp(Src) 98.5 F (36.9 C) (Oral)  Resp 16  Ht '5\' 10"'$  (1.778 m)  Wt 176 lb 8 oz (80.06 kg)  BMI 25.33 kg/m2  SpO2 98% Physical Exam  Constitutional: He is oriented to person, place, and time. He appears well-developed and well-nourished.  HENT:  Head: Normocephalic and atraumatic.  Eyes: Conjunctivae and EOM are normal. Pupils are equal, round, and reactive to light.  Neck: Normal range of motion. Neck supple.  Cardiovascular: Normal rate and regular rhythm.   Pulmonary/Chest: Effort normal. No respiratory distress. He has wheezes (slight in bases). He has no rales (slight in bases).  Abdominal: Soft. Bowel sounds are normal. He exhibits no distension. There is no tenderness. There is no rebound and no guarding.  Musculoskeletal: Normal range of motion. He exhibits no edema or tenderness.  Neurological: He is alert and oriented to person, place, and time.  No altered mental status, able to give full seemingly accurate history.  Face  is symmetric, EOM's intact, pupils equal and reactive, vision intact, tongue and uvula midline without deviation Upper and Lower extremity motor 5/5, intact pain perception in distal extremities, 2+ reflexes in biceps, patella and achilles tendons. Finger to nose normal, heel to shin normal.  Nursing note and vitals  reviewed.   ED Course  Procedures (including critical care time) Labs Review Labs Reviewed  COMPREHENSIVE METABOLIC PANEL - Abnormal; Notable for the following:    Chloride 100 (*)    Glucose, Bld 100 (*)    AST 74 (*)    ALT 77 (*)    Total Bilirubin 1.5 (*)    All other components within normal limits  CBC WITH DIFFERENTIAL/PLATELET  TROPONIN I  BRAIN NATRIURETIC PEPTIDE  TSH    Imaging Review Dg Chest 2 View  10/25/2015  CLINICAL DATA:  Pt stated he felt flushed and his blood pressure was high today EXAM: CHEST  2 VIEW COMPARISON:  07/03/2014 FINDINGS: Lateral view degraded by patient arm position. Moderate thoracic spondylosis. Numerous leads and wires project over the chest. Midline trachea. Borderline cardiomegaly. Atherosclerosis in the transverse aorta. No pleural effusion or pneumothorax. Clear lungs. IMPRESSION: No acute cardiopulmonary disease. Aortic atherosclerosis. Electronically Signed   By: Abigail Miyamoto M.D.   On: 10/25/2015 14:33   Ct Head Wo Contrast  10/25/2015  CLINICAL DATA:  Headache.  Elevated blood pressure. EXAM: CT HEAD WITHOUT CONTRAST TECHNIQUE: Contiguous axial images were obtained from the base of the skull through the vertex without intravenous contrast. COMPARISON:  None FINDINGS: Prominence of the sulci and ventricles identified. Mild nonspecific low attenuation within the subcortical and periventricular white matter identified compatible with chronic microvascular disease. No acute cortical infarct, hemorrhage, or mass lesion ispresent. Ventricles are of normal size. No significant extra-axial fluid collection is present. Moderate mucosal thickening involving the maxillaries sinuses, sphenoid sinus, ethmoid air cells noted. Mastoid air cells are clear. IMPRESSION: 1. No acute intracranial abnormalities. 2. Sinus opacification. Electronically Signed   By: Kerby Moors M.D.   On: 10/25/2015 14:48   I have personally reviewed and evaluated these images  and lab results as part of my medical decision-making.   EKG Interpretation   Date/Time:  Saturday October 25 2015 13:56:04 EST Ventricular Rate:  36 PR Interval:  175 QRS Duration: 156 QT Interval:  528 QTC Calculation: 408 R Axis:   -66 Text Interpretation:  Predominant 2:1 AV block with bradycardia RBBB and  LAFB Baseline wander in lead(s) III Confirmed by [facility] MD, Corene Cornea [phone])  on 10/25/2015 2:00:49 PM      MDM   Final diagnoses:  Essential hypertension  Bradycardia  Dizziness   Symptomatic bradycardia, also HBP. HA and dizziness as well. Has had bradycardia before and not symptomatic. Exam relatively benign here. Will check head ct, labs and likely admit to cardiology for further monitoring and management.   Discussed case with Dr. Aundra Dubin on the phone with cardiology. As the patient is not hypotensive and is no longer in bradycardic rhythm he feels that admission to our hospital for monitoring and management is appropriate. I discussed this with our hospitalist who felt uncomfortable with that plan and preferred to [facility] evaluation and management in case there was some need for cardiology intervention. Patient in normal sinus rhythm prior to transfer without any new symptoms or other evidence of instability.    Merrily Pew, MD 10/25/15 [phone]

## 2015-10-25 NOTE — ED Notes (Signed)
Pt states he checked his BP at home. Reports BP >220 and lower HR. Pt also reports HA.

## 2015-10-25 NOTE — Consult Note (Signed)
Admit date: 10/25/2015 Referring Physician  Dr. Dellia Nims Primary Physician  Dr. Sinda Du Primary Cardiologist  None Reason for Consultation  Heart block  HPI: Shaun May is a 64 y.o. male with history of HTN, hemochromatosis and frequent phlebotomies, anxiety, tobacco abuse and alcohol dependence, presented to the [facility] ED on 10/25/15 with complaints of facial flushing and high BP.  Approximately 3 weeks back when he had gone to [employer] to donate blood and they informed him that he had very high blood pressure. Since then he has followed up with his PCP who has been adjusting his medications. He claims compliance with his medications. Yesterday afternoon patient felt slightly dizzy but denied any other complaints. This was transient and resolved. He took his antihypertensives at approximately [facility]. He complained of some facial flushing and mild transient dizziness today. He checked his blood pressure an hour after taking his medications and it was 223/93 mmHg and a heart rate of 45bpm. He was concerned and hence presented to the ED. He denies headache, sore throat, facial stuffiness, earache, chest pain, dyspnea, palpitations, nausea, vomiting, diarrhea, abdominal pain, tingling, numbness or asymmetrical weakness. In the ED, his initial blood pressure was 213/64 but came down to SBP of 150s without medications. Heart rate is consistently in the 30s on telemetry.  EKG showed 2:1 heart block on EKG and telemetry.  He is now transferred to hospitalist service at [facility] and Cardiology is now asked to consult for further evaluation.  He denies any chest pain, palpitations or syncope.  He has noticed over the past few days increased fatigue.  He has chronic DOE secondary to smoking.  He has also noticed some LE edema over the past few days.       PMH:   Past Medical History  Diagnosis Date  . Hypertension   . Hemochromatosis      PSH:  History reviewed. No pertinent past  surgical history.  Allergies:  Review of patient's allergies indicates no known allergies. Prior to Admit Meds:   Prescriptions prior to admission  Medication Sig Dispense Refill Last Dose  . amLODipine (NORVASC) 10 MG tablet Take 1 tablet by mouth daily.   10/25/2015 at Unknown time  . HYDROcodone-acetaminophen (NORCO) 10-325 MG tablet Take 1 tablet by mouth every 6 (six) hours as needed for moderate pain.   10/25/2015 at Unknown time  . lisinopril (PRINIVIL,ZESTRIL) 20 MG tablet Take 2 tablets by mouth daily.   10/25/2015 at Unknown time  . LORazepam (ATIVAN) 2 MG tablet Take 1 tablet by mouth 4 (four) times daily.    10/25/2015 at Unknown time  . potassium chloride SA (K-DUR,KLOR-CON) 20 MEQ tablet Take 1 tablet by mouth daily.   10/25/2015 at Unknown time   Fam HX:    Family History  Problem Relation Age of Onset  . Diabetes Other    Social HX:    Social History   Social History  . Marital Status: Divorced    Spouse Name: N/A  . Number of Children: N/A  . Years of Education: N/A   Occupational History  . Not on file.   Social History Main Topics  . Smoking status: Current Every Day Smoker -- 2.50 packs/day    Types: Cigarettes  . Smokeless tobacco: Not on file  . Alcohol Use: 12.6 oz/week    21 Shots of liquor per week     Comment: nightly  . Drug Use: No  . Sexual Activity: Not on file  Other Topics Concern  . Not on file   Social History Narrative  . No narrative on file     ROS:  All 11 ROS were addressed and are negative except what is stated in the HPI  Physical Exam: Blood pressure 167/69, pulse 82, temperature 98.5 F (36.9 C), temperature source Oral, resp. rate 16, height '5\' 10"'$  (1.778 m), weight 80.06 kg (176 lb 8 oz), SpO2 98 %.    General: Well developed, well nourished, in no acute distress Head: Eyes PERRLA, No xanthomas.   Normal cephalic and atramatic  Lungs:   Clear bilaterally to auscultation and percussion. Heart:   HRRR S1 S2 Pulses are  2+ & equal.            No carotid bruit. No JVD.  No abdominal bruits. No femoral bruits. Abdomen: Bowel sounds are positive, abdomen soft and non-tender without masses Extremities:   No clubbing, cyanosis or edema.  DP +1 Neuro: Alert and oriented X 3. Psych:  Good affect, responds appropriately    Labs:   Lab Results  Component Value Date   WBC 7.2 10/25/2015   HGB 15.9 10/25/2015   HCT 46.1 10/25/2015   MCV 97.1 10/25/2015   PLT 229 10/25/2015     Recent Labs Lab 10/25/15 1418  NA 140  K 4.2  CL 100*  CO2 28  BUN 12  CREATININE 0.70  CALCIUM 9.6  PROT 7.8  BILITOT 1.5*  ALKPHOS 50  ALT 77*  AST 74*  GLUCOSE 100*   No results found for: PTT No results found for: INR, PROTIME Lab Results  Component Value Date   TROPONINI <0.03 10/25/2015    No results found for: CHOL No results found for: HDL No results found for: LDLCALC No results found for: TRIG No results found for: CHOLHDL No results found for: LDLDIRECT    Radiology:  Dg Chest 2 View  10/25/2015  CLINICAL DATA:  Pt stated he felt flushed and his blood pressure was high today EXAM: CHEST  2 VIEW COMPARISON:  07/03/2014 FINDINGS: Lateral view degraded by patient arm position. Moderate thoracic spondylosis. Numerous leads and wires project over the chest. Midline trachea. Borderline cardiomegaly. Atherosclerosis in the transverse aorta. No pleural effusion or pneumothorax. Clear lungs. IMPRESSION: No acute cardiopulmonary disease. Aortic atherosclerosis. Electronically Signed   By: Abigail Miyamoto M.D.   On: 10/25/2015 14:33   Ct Head Wo Contrast  10/25/2015  CLINICAL DATA:  Headache.  Elevated blood pressure. EXAM: CT HEAD WITHOUT CONTRAST TECHNIQUE: Contiguous axial images were obtained from the base of the skull through the vertex without intravenous contrast. COMPARISON:  None FINDINGS: Prominence of the sulci and ventricles identified. Mild nonspecific low attenuation within the subcortical and  periventricular white matter identified compatible with chronic microvascular disease. No acute cortical infarct, hemorrhage, or mass lesion ispresent. Ventricles are of normal size. No significant extra-axial fluid collection is present. Moderate mucosal thickening involving the maxillaries sinuses, sphenoid sinus, ethmoid air cells noted. Mastoid air cells are clear. IMPRESSION: 1. No acute intracranial abnormalities. 2. Sinus opacification. Electronically Signed   By: Kerby Moors M.D.   On: 10/25/2015 14:48    EKG:  NSR with 2:1 second degree AV block with RBBB and LAFB  ASSESSMENT/PLAN: 1.  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  The patient's  Symptoms include exertional fatigue and  dizziness.  He is not on any AV nodal blocking agents.  He is hemodynamically stable and actually hypertensive.  Will get  EP consult in am for PPM on Monday.  Check TSH.  Need to consider ischemia given history of poorly controlled HTN, tobacco abuse.  Check 2D echo in am to assess LVF.  Avoid AV nodal blocking agents for BP control until PPM placed.  Zoll pads. 2.  Hypertensive urgency.  BP improved in ER.  Continue ACE.  Would avoid amlodipine for now.  3.  Dizziness ? Related to bradycardia or extremely high BP.   4.  Hemachromatosis - check 2D echo to rule out cardiomyopathy 5.  Tobacco abuse - cessation counseling per nursing.    Sueanne Margarita, MD  10/25/2015  8:06 PM

## 2015-10-25 NOTE — Consult Note (Signed)
Admit date: (Not on file) Referring Physician  Dr. Dellia Nims Primary Physician  Dr. Sinda Du Primary Cardiologist  None Reason for Consultation  Heart block  HPI: Shaun May is a 64 y.o. male with history of HTN, hemochromatosis and frequent phlebotomies, anxiety, tobacco abuse and alcohol dependence, presented to the [facility] ED on 10/25/15 with complaints of facial flushing and high BP.  Approximately 3 weeks back when he had gone to [employer] to donate blood and they informed him that he had very high blood pressure. Since then he has followed up with his PCP who has been adjusting his medications. He claims compliance with his medications. Yesterday afternoon patient felt slightly dizzy but denied any other complaints. This was transient and resolved. He took his antihypertensives at approximately [facility]. He complained of some facial flushing and mild transient dizziness today. He checked his blood pressure an hour after taking his medications and it was 223/93 mmHg and a heart rate of 45bpm. He was concerned and hence presented to the ED. He denies headache, sore throat, facial stuffiness, earache, chest pain, dyspnea, palpitations, nausea, vomiting, diarrhea, abdominal pain, tingling, numbness or asymmetrical weakness. In the ED, his initial blood pressure was 213/64 but came down to SBP of 150s without medications. Heart rate is consistently in the 30s on telemetry.  EKG showed 2:1 heart block on EKG and telemetry.  He is now transferred to hospitalist service at [facility] and Cardiology is now asked to consult for further evaluation.     PMH:   Past Medical History  Diagnosis Date  . Hypertension   . Hemochromatosis      PSH:  No past surgical history on file.  Allergies:  Review of patient's allergies indicates no known allergies. Prior to Admit Meds:   (Not in a hospital admission) Fam HX:    Family History  Problem Relation Age of Onset  . Diabetes Other    Social  HX:    Social History   Social History  . Marital Status: Divorced    Spouse Name: N/A  . Number of Children: N/A  . Years of Education: N/A   Occupational History  . Not on file.   Social History Main Topics  . Smoking status: Current Every Day Smoker -- 2.50 packs/day    Types: Cigarettes  . Smokeless tobacco: Not on file  . Alcohol Use: 12.6 oz/week    21 Shots of liquor per week     Comment: nightly  . Drug Use: No  . Sexual Activity: Not on file   Other Topics Concern  . Not on file   Social History Narrative  . No narrative on file     ROS:  All 11 ROS were addressed and are negative except what is stated in the HPI  Physical Exam: There were no vitals taken for this visit.    General: Well developed, well nourished, in no acute distress Head: Eyes PERRLA, No xanthomas.   Normal cephalic and atramatic  Lungs:   Clear bilaterally to auscultation and percussion. Heart:   HRRR S1 S2 Pulses are 2+ & equal.            No carotid bruit. No JVD.  No abdominal bruits. No femoral bruits. Abdomen: Bowel sounds are positive, abdomen soft and non-tender without masses Extremities:   No clubbing, cyanosis or edema.  DP +1 Neuro: Alert and oriented X 3. Psych:  Good affect, responds appropriately    Labs:  Lab Results  Component Value Date   WBC 7.2 10/25/2015   HGB 15.9 10/25/2015   HCT 46.1 10/25/2015   MCV 97.1 10/25/2015   PLT 229 10/25/2015    Recent Labs Lab 10/25/15 1418  NA 140  K 4.2  CL 100*  CO2 28  BUN 12  CREATININE 0.70  CALCIUM 9.6  PROT 7.8  BILITOT 1.5*  ALKPHOS 50  ALT 77*  AST 74*  GLUCOSE 100*   No results found for: PTT No results found for: INR, PROTIME Lab Results  Component Value Date   TROPONINI <0.03 10/25/2015    No results found for: CHOL No results found for: HDL No results found for: LDLCALC No results found for: TRIG No results found for: CHOLHDL No results found for: LDLDIRECT    Radiology:  Dg Chest 2  View  10/25/2015  CLINICAL DATA:  Pt stated he felt flushed and his blood pressure was high today EXAM: CHEST  2 VIEW COMPARISON:  07/03/2014 FINDINGS: Lateral view degraded by patient arm position. Moderate thoracic spondylosis. Numerous leads and wires project over the chest. Midline trachea. Borderline cardiomegaly. Atherosclerosis in the transverse aorta. No pleural effusion or pneumothorax. Clear lungs. IMPRESSION: No acute cardiopulmonary disease. Aortic atherosclerosis. Electronically Signed   By: Abigail Miyamoto M.D.   On: 10/25/2015 14:33   Ct Head Wo Contrast  10/25/2015  CLINICAL DATA:  Headache.  Elevated blood pressure. EXAM: CT HEAD WITHOUT CONTRAST TECHNIQUE: Contiguous axial images were obtained from the base of the skull through the vertex without intravenous contrast. COMPARISON:  None FINDINGS: Prominence of the sulci and ventricles identified. Mild nonspecific low attenuation within the subcortical and periventricular white matter identified compatible with chronic microvascular disease. No acute cortical infarct, hemorrhage, or mass lesion ispresent. Ventricles are of normal size. No significant extra-axial fluid collection is present. Moderate mucosal thickening involving the maxillaries sinuses, sphenoid sinus, ethmoid air cells noted. Mastoid air cells are clear. IMPRESSION: 1. No acute intracranial abnormalities. 2. Sinus opacification. Electronically Signed   By: Kerby Moors M.D.   On: 10/25/2015 14:48    EKG:  NSR with 2:1 second degree AV block with RBBB and LAFB  ASSESSMENT/PLAN: 1.  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  The patient's only symptom is dizziness.  He is not on any AV nodal blocking agents.  He is hemodynamically stable and actually hypertensive.  Will get EP consult in am for PPM on Monday.  Check TSH.  Need to consider ischemia given history of poorly controlled HTN, tobacco abuse.  Check 2D echo in am to assess LVF.  Avoid AV nodal blocking agents for  BP control until PPM placed.  Zoll pads. 2.  Hypertensive urgency.  BP improved in ER.  Continue ACE.  Would avoid amlodipine for now.  3.  Dizziness ? Related to bradycardia or extremely high BP.   4.  Hemachromatosis - check 2D echo to rule out cardiomyopathy 5.  Tobacco abuse - cessation counseling per nursing.    Sueanne Margarita, MD  10/25/2015  6:15 PM

## 2015-10-26 ENCOUNTER — Observation Stay (HOSPITAL_COMMUNITY): Payer: 59

## 2015-10-26 DIAGNOSIS — I10 Essential (primary) hypertension: Secondary | ICD-10-CM | POA: Diagnosis not present

## 2015-10-26 DIAGNOSIS — F1020 Alcohol dependence, uncomplicated: Secondary | ICD-10-CM | POA: Diagnosis not present

## 2015-10-26 DIAGNOSIS — I441 Atrioventricular block, second degree: Secondary | ICD-10-CM | POA: Diagnosis not present

## 2015-10-26 LAB — FERRITIN: Ferritin: 275 ng/mL (ref 24–336)

## 2015-10-26 MED ORDER — HYDRALAZINE HCL 20 MG/ML IJ SOLN: 10.0000 mg | Freq: Four times a day (QID) | INTRAMUSCULAR | Status: DC | PRN

## 2015-10-26 MED ORDER — SALINE SPRAY 0.65 % NA SOLN
1.0000 | NASAL | Status: DC | PRN
  Filled 2015-10-26: qty 44

## 2015-10-26 NOTE — Progress Notes (Signed)
Subjective:  Pressure remains elevated.  Heart rate has improved overnight.  But still has some dropped beats with what appear to be secondarily heart block.  Difficult to tell if it is Wenckebach or not.  Objective:  Vital Signs in the last 24 hours: BP 169/89 mmHg  Pulse 62  Temp(Src) 98.6 F (37 C) (Oral)  Resp 12  Ht '5\' 10"'$  (1.778 m)  Wt 79.47 kg (175 lb 3.2 oz)  BMI 25.14 kg/m2  SpO2 97%  Physical Exam: Pleasant male in no acute distress mildly obese Lungs:  Clear Cardiac:  Regular rhythm, normal S1 and S2, no S3 Extremities:  No edema present  Intake/Output from previous day: 11/19 0701 - 11/20 0700 In: 600 [P.O.:600] Out: 825 [Urine:825]  Weight Filed Weights   10/25/15 1345 10/25/15 1835 10/26/15 0500  Weight: 84.369 kg (186 lb) 80.06 kg (176 lb 8 oz) 79.47 kg (175 lb 3.2 oz)    Lab Results: Basic Metabolic Panel:  Recent Labs  10/25/15 1418  NA 140  K 4.2  CL 100*  CO2 28  GLUCOSE 100*  BUN 12  CREATININE 0.70   CBC:  Recent Labs  10/25/15 1418  WBC 7.2  NEUTROABS 5.4  HGB 15.9  HCT 46.1  MCV 97.1  PLT 229   Cardiac Panel (last 3 results)  Recent Labs  10/25/15 1418  TROPONINI <0.03    Telemetry: Sinus rhythm with what appears to be prolonged first-degree AV block and what may be Wenckebach.  Heart rate is faster now and 2-1 block has diminished   Assessment/Plan:  1.  Accelerated hypertension which has improved 2.  Bifascicular block with previous 2-1 heart block that has improved 3.  Dizziness  RECOMMENDATIONS:  Await results of echocardiogram today.  EP consultation in the morning regarding potential need for pacemaker.     Kerry Hough  MD [facility]  10/26/2015, 8:24 AM

## 2015-10-26 NOTE — Progress Notes (Signed)
  Echocardiogram 2D Echocardiogram has been performed.  Shaun May 10/26/2015, 10:10 AM

## 2015-10-26 NOTE — Progress Notes (Addendum)
[facility] Progress Note    Patient: Shaun May    PFX:[id number]  DOB: Apr 18, 1951     DOA: 10/25/2015 Date of Service: the patient was seen and examined on 10/26/2015 Day  2 of admission.  Subjective: Patient denies any compressive dizziness or lightheadedness. No chest pain or shortness of breath. Nutrition: Able to tolerate oral diet. No nausea Activity: Ambulating in the room. Last BM: 10/26/2015  Assessment and Plan: 1. Heart block AV second degree Patient initially presented at any panel 10/25/2015 with flushing and high blood pressure. He was found to have 2:1 heart block on EKG. Cardiology is following up with the patient and EP will be consulted on Monday for pacemaker evaluation. Continue monitoring on telemetry. Avoid AV nodal blocking agents.  2. Accelerated hypertension Blood pressure elevated. Most likely compensated 3. Continue lisinopril and HOLD amlodipine. Continuing hydralazine ordered as well as when necessary hydralazine.  3  Hemochromatosis Serum ferritin 275. Stable. Continue monitoring.  4  Tobacco abuse Cessation consult. Continue nicotine patch.  5  Alcohol dependence (HCC) Multiple CIWA score 0.  Patient does not appear to be having any withdrawal at present. Continue telemetry monitoring as well as thiamine and folic acid.   DVT Prophylaxis: subcutaneous Heparin Nutrition: Cardiac diet   Advance goals of care discussion: Full code   Brief Summary of Hospitalization:  HPI:On 10/25/2015 the patient presented at an event hospital with complaints of high blood pressure. He also had some facial flushing. He was recently told that he continues to have high blood pressure 3 weeks ago as well. Patient was found to be having bradycardia with heart block and cardiology was consulted and the patient was transferred to [facility].  Daily update: 10/25/2015 cartilage was consulted. BP will be consulted on 10/27/2015. Consultants:  Cardiology, electrophysiology  Procedures: None   Family Communication: family was present at bedside, opportunity was given to ask question and all questions were answered satisfactorily at the time of interview.  Disposition:  Expected discharge date: 10/28/2015  Barriers to safe discharge:EP consultation    Intake/Output Summary (Last 24 hours) at 10/26/15 1228 Last data filed at 10/26/15 0013  Gross per 24 hour  Intake    600 ml  Output    825 ml  Net   -225 ml   Filed Weights   10/25/15 1345 10/25/15 1835 10/26/15 0500  Weight: 84.369 kg (186 lb) 80.06 kg (176 lb 8 oz) 79.47 kg (175 lb 3.2 oz)    Objective: Physical Exam: Filed Vitals:   10/25/15 1900 10/26/15 0012 10/26/15 0500 10/26/15 1034  BP: 167/69 191/73 169/89 167/90  Pulse:  65 62   Temp:  98.5 F (36.9 C) 98.6 F (37 C)   TempSrc:      Resp:  18 12   Height:      Weight:   79.47 kg (175 lb 3.2 oz)   SpO2:  96% 97%      General: Appear in mild distress, no Rash; Oral Mucosa moist. Cardiovascular: S1 and S2 Present, no Murmur, no JVD Respiratory: Bilateral Air entry present and Clear to Auscultation, no Crackles, no wheezes Abdomen: Bowel Sound present, Soft and no tenderness Extremities: no Pedal edema, no calf tenderness Neurology: Grossly no focal neuro deficit.  Data Reviewed: CBC:  Recent Labs Lab 10/25/15 1418  WBC 7.2  NEUTROABS 5.4  HGB 15.9  HCT 46.1  MCV 97.1  PLT 329   Basic Metabolic Panel:  Recent Labs Lab 10/25/15 1418  NA  140  K 4.2  CL 100*  CO2 28  GLUCOSE 100*  BUN 12  CREATININE 0.70  CALCIUM 9.6   Liver Function Tests:  Recent Labs Lab 10/25/15 1418  AST 74*  ALT 77*  ALKPHOS 50  BILITOT 1.5*  PROT 7.8  ALBUMIN 4.3   No results for input(s): LIPASE, AMYLASE in the last 168 hours. No results for input(s): AMMONIA in the last 168 hours.  Cardiac Enzymes:  Recent Labs Lab 10/25/15 1418  TROPONINI <0.03   BNP (last 3 results)  Recent Labs   10/25/15 1420  BNP 95.0    ProBNP (last 3 results) No results for input(s): PROBNP in the last 8760 hours.   CBG: No results for input(s): GLUCAP in the last 168 hours.  No results found for this or any previous visit (from the past 240 hour(s)).   Studies: Dg Chest 2 View  10/25/2015  CLINICAL DATA:  Pt stated he felt flushed and his blood pressure was high today EXAM: CHEST  2 VIEW COMPARISON:  07/03/2014 FINDINGS: Lateral view degraded by patient arm position. Moderate thoracic spondylosis. Numerous leads and wires project over the chest. Midline trachea. Borderline cardiomegaly. Atherosclerosis in the transverse aorta. No pleural effusion or pneumothorax. Clear lungs. IMPRESSION: No acute cardiopulmonary disease. Aortic atherosclerosis. Electronically Signed   By: Abigail Miyamoto M.D.   On: 10/25/2015 14:33   Ct Head Wo Contrast  10/25/2015  CLINICAL DATA:  Headache.  Elevated blood pressure. EXAM: CT HEAD WITHOUT CONTRAST TECHNIQUE: Contiguous axial images were obtained from the base of the skull through the vertex without intravenous contrast. COMPARISON:  None FINDINGS: Prominence of the sulci and ventricles identified. Mild nonspecific low attenuation within the subcortical and periventricular white matter identified compatible with chronic microvascular disease. No acute cortical infarct, hemorrhage, or mass lesion ispresent. Ventricles are of normal size. No significant extra-axial fluid collection is present. Moderate mucosal thickening involving the maxillaries sinuses, sphenoid sinus, ethmoid air cells noted. Mastoid air cells are clear. IMPRESSION: 1. No acute intracranial abnormalities. 2. Sinus opacification. Electronically Signed   By: Kerby Moors M.D.   On: 10/25/2015 14:48     Scheduled Meds: . enoxaparin (LOVENOX) injection  40 mg Subcutaneous Q24H  . folic acid  1 mg Oral Daily  . hydrALAZINE  25 mg Oral TID  . lisinopril  40 mg Oral Daily  . LORazepam  2 mg Oral QID   . multivitamin with minerals  1 tablet Oral Daily  . nicotine  21 mg Transdermal Q24H  . pneumococcal 23 valent vaccine  0.5 mL Intramuscular Tomorrow-1000  . potassium chloride SA  20 mEq Oral Daily  . sodium chloride  3 mL Intravenous Q12H  . thiamine  100 mg Oral Daily   Continuous Infusions:   Time spent: 30 minutes.  Author: Berle Mull, MD [facility] Pager: [phone] 10/26/2015 12:28 PM  If 7PM-7AM, please contact night-coverage at [email or web address], password [facility]

## 2015-10-27 DIAGNOSIS — I10 Essential (primary) hypertension: Secondary | ICD-10-CM | POA: Diagnosis present

## 2015-10-27 DIAGNOSIS — F1020 Alcohol dependence, uncomplicated: Secondary | ICD-10-CM | POA: Diagnosis not present

## 2015-10-27 DIAGNOSIS — I160 Hypertensive urgency: Secondary | ICD-10-CM | POA: Diagnosis present

## 2015-10-27 DIAGNOSIS — I441 Atrioventricular block, second degree: Secondary | ICD-10-CM | POA: Diagnosis present

## 2015-10-27 DIAGNOSIS — R001 Bradycardia, unspecified: Secondary | ICD-10-CM | POA: Diagnosis present

## 2015-10-27 DIAGNOSIS — I452 Bifascicular block: Secondary | ICD-10-CM | POA: Diagnosis present

## 2015-10-27 DIAGNOSIS — R7989 Other specified abnormal findings of blood chemistry: Secondary | ICD-10-CM | POA: Diagnosis present

## 2015-10-27 DIAGNOSIS — F410 Panic disorder [episodic paroxysmal anxiety] without agoraphobia: Secondary | ICD-10-CM | POA: Diagnosis present

## 2015-10-27 DIAGNOSIS — F17210 Nicotine dependence, cigarettes, uncomplicated: Secondary | ICD-10-CM | POA: Diagnosis present

## 2015-10-27 LAB — COMPREHENSIVE METABOLIC PANEL
ALBUMIN: 3.8 g/dL (ref 3.5–5.0)
ALT: 67 U/L — AB (ref 17–63)
AST: 64 U/L — AB (ref 15–41)
Alkaline Phosphatase: 51 U/L (ref 38–126)
Anion gap: 10 (ref 5–15)
BUN: 6 mg/dL (ref 6–20)
CHLORIDE: 100 mmol/L — AB (ref 101–111)
CO2: 28 mmol/L (ref 22–32)
CREATININE: 0.7 mg/dL (ref 0.61–1.24)
Calcium: 9.4 mg/dL (ref 8.9–10.3)
GFR calc non Af Amer: >60 mL/min (ref >60)
Glucose, Bld: 99 mg/dL (ref 65–99)
Potassium: 3.2 mmol/L — ABNORMAL LOW (ref 3.5–5.1)
SODIUM: 138 mmol/L (ref 135–145)
Total Bilirubin: 2.1 mg/dL — ABNORMAL HIGH (ref 0.3–1.2)
Total Protein: 7.1 g/dL (ref 6.5–8.1)

## 2015-10-27 LAB — CBC WITH DIFFERENTIAL/PLATELET
BASOS ABS: 0 10*3/uL (ref 0.0–0.1)
BASOS PCT: 0 %
EOS ABS: 0.1 10*3/uL (ref 0.0–0.7)
EOS PCT: 2 %
HCT: 43.7 % (ref 39.0–52.0)
Hemoglobin: 15 g/dL (ref 13.0–17.0)
Lymphocytes Relative: 24 %
Lymphs Abs: 1.8 10*3/uL (ref 0.7–4.0)
MCH: 33 pg (ref 26.0–34.0)
MCHC: 34.3 g/dL (ref 30.0–36.0)
MCV: 96 fL (ref 78.0–100.0)
Monocytes Absolute: 0.7 10*3/uL (ref 0.1–1.0)
Monocytes Relative: 9 %
Neutro Abs: 4.8 10*3/uL (ref 1.7–7.7)
Neutrophils Relative %: 65 %
PLATELETS: 215 10*3/uL (ref 150–400)
RBC: 4.55 MIL/uL (ref 4.22–5.81)
RDW: 12.9 % (ref 11.5–15.5)
WBC: 7.3 10*3/uL (ref 4.0–10.5)

## 2015-10-27 LAB — MAGNESIUM: Magnesium: 2 mg/dL (ref 1.7–2.4)

## 2015-10-27 MED ORDER — SODIUM CHLORIDE 0.9 % IV SOLN
INTRAVENOUS | Status: DC
  Administered 2015-10-28: 06:00:00 via INTRAVENOUS

## 2015-10-27 MED ORDER — CEFAZOLIN SODIUM-DEXTROSE 2-3 GM-% IV SOLR
2.0000 g | INTRAVENOUS | Status: DC
  Filled 2015-10-27: qty 50

## 2015-10-27 MED ORDER — SODIUM CHLORIDE 0.9 % IV SOLN: INTRAVENOUS | Status: DC

## 2015-10-27 MED ORDER — SODIUM CHLORIDE 0.9 % IR SOLN
80.0000 mg | Status: DC
  Filled 2015-10-27: qty 2

## 2015-10-27 MED ORDER — ENSURE ENLIVE PO LIQD
237.0000 mL | Freq: Two times a day (BID) | ORAL | Status: DC
  Administered 2015-10-27 – 2015-10-29 (×2): 237 mL via ORAL

## 2015-10-27 MED ORDER — DIPHENHYDRAMINE HCL 25 MG PO CAPS
25.0000 mg | ORAL_CAPSULE | Freq: Once | ORAL | Status: AC
  Administered 2015-10-27: 25 mg via ORAL
  Filled 2015-10-27: qty 1

## 2015-10-27 MED ORDER — CHLORHEXIDINE GLUCONATE 4 % EX LIQD
60.0000 mL | Freq: Once | CUTANEOUS | Status: AC
  Administered 2015-10-28: 4 via TOPICAL
  Filled 2015-10-27: qty 60

## 2015-10-27 MED ORDER — CHLORHEXIDINE GLUCONATE 4 % EX LIQD
60.0000 mL | Freq: Once | CUTANEOUS | Status: AC
  Administered 2015-10-27: 4 via TOPICAL
  Filled 2015-10-27: qty 15

## 2015-10-27 NOTE — Consult Note (Signed)
ELECTROPHYSIOLOGY CONSULT NOTE    Patient ID: Shaun May MRN: [id number], DOB/AGE: May 26, 1951 64 y.o.  Admit date: 10/25/2015 Date of Consult: 10/27/2015  Primary Physician: Alonza Bogus, MD Primary Cardiologist: (New) Dr. Radford Pax  Reason for Consultation: Bradycardia, heart block  HPI: Shaun May is a 64 y.o. male presented to [facility] hospital 10/15/15 with c/o an episode of flushing associated with SBP >200 at home.  He was observed to have significant HTN (213/64 that improved to SBP 150's without intervention) and bradycardia with 2:1 heart block in the 30's and it was decided to transfer him to [facility] for further evaluation and care. Prior to coming in, with his PMD he was being treated for high blood pressure that was originally noted while he was donating blood, in the couple weeks prior to coming hin his medicines have been adjusted.  He noted at home the day prior to coming an episode of transient dizziness, no near syncope or syncope.  HE reports about 3-4 weeks ago he wen to donate blod, which he does about every 6-8weeks secondary to hemochromatosis.  While there they did not let him donate he says they told him his BP was very high and his pulse low, but denies any particular symptoms.  He does admit though for probably a couple weeks prior to that feels like he hadn't had much energy which is unusal.  He saw his PMD who told him his BP medicine was probably causing his slow pulse and changed him to the current meds, he cannot recall that prior medicine, but had been off it about 3 weeks now.  His PMD has adjusted the dosing of his current medicines, but for 3 weeks, only the on the current list.  NO AVnodal or rate limiting medicines noted.  Since here he feels well.  He denies now or previously any kind of CP, palpitations or SOB.  He had only the one dizzy spell, no near syncope or syncope.  Past Medical History  Diagnosis Date  . Hypertension   . Hemochromatosis       Surgical History: History reviewed. No pertinent past surgical history.   Prescriptions prior to admission  Medication Sig Dispense Refill Last Dose  . amLODipine (NORVASC) 10 MG tablet Take 1 tablet by mouth daily.   10/25/2015 at Unknown time  . HYDROcodone-acetaminophen (NORCO) 10-325 MG tablet Take 1 tablet by mouth every 6 (six) hours as needed for moderate pain.   10/25/2015 at Unknown time  . lisinopril (PRINIVIL,ZESTRIL) 20 MG tablet Take 2 tablets by mouth daily.   10/25/2015 at Unknown time  . LORazepam (ATIVAN) 2 MG tablet Take 1 tablet by mouth 4 (four) times daily.    10/25/2015 at Unknown time  . potassium chloride SA (K-DUR,KLOR-CON) 20 MEQ tablet Take 1 tablet by mouth daily.   10/25/2015 at Unknown time    Inpatient Medications:  . enoxaparin (LOVENOX) injection  40 mg Subcutaneous Q24H  . feeding supplement (ENSURE ENLIVE)  237 mL Oral BID BM  . folic acid  1 mg Oral Daily  . hydrALAZINE  25 mg Oral TID  . lisinopril  40 mg Oral Daily  . LORazepam  2 mg Oral QID  . multivitamin with minerals  1 tablet Oral Daily  . nicotine  21 mg Transdermal Q24H  . pneumococcal 23 valent vaccine  0.5 mL Intramuscular Tomorrow-1000  . potassium chloride SA  20 mEq Oral Daily  . sodium chloride  3 mL Intravenous Q12H  .  thiamine  100 mg Oral Daily    Allergies: No Known Allergies  Social History   Social History  . Marital Status: Divorced    Spouse Name: N/A  . Number of Children: N/A  . Years of Education: N/A   Occupational History  . Not on file.   Social History Main Topics  . Smoking status: Current Every Day Smoker -- 2.50 packs/day    Types: Cigarettes  . Smokeless tobacco: Not on file  . Alcohol Use: 12.6 oz/week    21 Shots of liquor per week     Comment: nightly  . Drug Use: No  . Sexual Activity: Not on file   Other Topics Concern  . Not on file   Social History Narrative  . No narrative on file     Family History  Problem Relation Age of  Onset  . Diabetes Other      Review of Systems: All other systems reviewed and are otherwise negative except as noted above.  Physical Exam: Filed Vitals:   10/26/15 2100 10/27/15 0500 10/27/15 1023 10/27/15 1026  BP: 173/68 133/64 129/63 129/63  Pulse: 48 48    Temp: 98.4 F (36.9 C) 98.9 F (37.2 C)    TempSrc:      Resp: 17 16    Height:      Weight:  173 lb 12.8 oz (78.835 kg)    SpO2: 98% 98%     GEN- The patient is well appearing, alert and oriented x 3 today.   HEENT: normocephalic, atraumatic; sclera clear, conjunctiva pink; hearing intact; neck supple, no JVP Lymph- no cervical lymphadenopathy Lungs- Clear to ausculation bilaterally, normal work of breathing.  No wheezes, rales, rhonchi Heart- Regular rate and rhythm, no murmurs, rubs or gallops GI- soft, non-tender Extremities- no clubbing, cyanosis, or edema MS- no significant deformity or atrophy Skin- warm and dry, no rash or lesion Psych- euthymic mood, full affect Neuro- no gross deficits observed  Labs:   Lab Results  Component Value Date   WBC 7.3 10/27/2015   HGB 15.0 10/27/2015   HCT 43.7 10/27/2015   MCV 96.0 10/27/2015   PLT 215 10/27/2015    Recent Labs Lab 10/27/15 0448  NA 138  K 3.2*  CL 100*  CO2 28  BUN 6  CREATININE 0.70  CALCIUM 9.4  PROT 7.1  BILITOT 2.1*  ALKPHOS 51  ALT 67*  AST 64*  GLUCOSE 99    10/26/15: Echocardiogram Study Conclusions - Left ventricle: The cavity size was normal. Systolic function was normal. The estimated ejection fraction was in the range of 55% to 60%. Wall motion was normal; there were no regional wall motion abnormalities. Doppler parameters are consistent with abnormal left ventricular relaxation (grade 1 diastolic dysfunction). - Mitral valve: Mildly calcified annulus. - Left atrium: The atrium was mildly dilated.   Radiology/Studies:  Dg Chest 2 View 10/25/2015  CLINICAL DATA:  Pt stated he felt flushed and his blood  pressure was high today EXAM: CHEST  2 VIEW COMPARISON:  07/03/2014 FINDINGS: Lateral view degraded by patient arm position. Moderate thoracic spondylosis. Numerous leads and wires project over the chest. Midline trachea. Borderline cardiomegaly. Atherosclerosis in the transverse aorta. No pleural effusion or pneumothorax. Clear lungs. IMPRESSION: No acute cardiopulmonary disease. Aortic atherosclerosis. Electronically Signed   By: Abigail Miyamoto M.D.   On: 10/25/2015 14:33   Ct Head Wo Contrast 10/25/2015  CLINICAL DATA:  Headache.  Elevated blood pressure. EXAM: CT HEAD WITHOUT CONTRAST TECHNIQUE: Contiguous  axial images were obtained from the base of the skull through the vertex without intravenous contrast. COMPARISON:  None FINDINGS: Prominence of the sulci and ventricles identified. Mild nonspecific low attenuation within the subcortical and periventricular white matter identified compatible with chronic microvascular disease. No acute cortical infarct, hemorrhage, or mass lesion ispresent. Ventricles are of normal size. No significant extra-axial fluid collection is present. Moderate mucosal thickening involving the maxillaries sinuses, sphenoid sinus, ethmoid air cells noted. Mastoid air cells are clear. IMPRESSION: 1. No acute intracranial abnormalities. 2. Sinus opacification. Electronically Signed   By: Kerby Moors M.D.   On: 10/25/2015 14:48    EKG: 2:1 AVblock, RBBB, LAD, 36bpm TELEMETRY: Generally 2:1 AVblock, 30's-40s, at times appears to have SR with 1st degree AVblock, though continues with 2:1 AVblock for prolonged periods of time  Assessment and Plan:  1. 2:1 AVblock     He was on amlodipine, last dose yesterday AM, not certain this contributed     He may benefit from PPM implant     He ate breakfast and lunch today, we Kiaan Overholser schedule him for tomorrow and watch his rhythm today.  He is asymptomatic at this time in bed.      Risks benefits discussed with the patient by Dr. Curt Bears, he  is agreeable  2. HTN     Do not use nodal blocking drugs     Appears much improved today  3. Smoking     He is counselled  4. ABnormal LFTs     He drinks 4-6 liquor drinks most nights     counsed on ETOH      LFTs deferred to medicine group       Signed, Tommye Standard, PA-C 10/27/2015 11:58 AM  I have seen and examined this patient with Tommye Standard.  Agree with above, note added to reflect my findings.  On exam, regular rhythm, no murmurs, lungs clear.  Patient with 2:1 heart block and symptoms of weakness and fatigue for the last few months.  Discussed pacemaker with the patient including risks (bleeding, infection, tamponade, pneumothorax) and the patient has agreed to have this procedure done. Have made him NPO after MN tonight.    Vontrell Pullman M. Lenn Volker MD 10/27/2015 4:11 PM

## 2015-10-27 NOTE — Progress Notes (Signed)
Initial Nutrition Assessment  DOCUMENTATION CODES:   Not applicable  INTERVENTION:    Ensure Enlive PO BID, each supplement provides 350 kcal and 20 grams of protein  NUTRITION DIAGNOSIS:   Inadequate oral intake related to poor appetite as evidenced by per patient/family report.  GOAL:   Patient will meet greater than or equal to 90% of their needs  MONITOR:   PO intake, Supplement acceptance, Labs, Weight trends  REASON FOR ASSESSMENT:   Malnutrition Screening Tool    ASSESSMENT:   64 y.o. male with history of HTN, hemochromatosis and frequent phlebotomies, anxiety, tobacco abuse and alcohol dependence, presented to the [facility] ED on 10/25/15 with complaints of high blood pressure, low heart rate, and facial flushing.   Patient reports that he has been eating well at home, but doesn't eat a lot. He eats 2 meals per day usually, brunch and supper. He had a cook, but she has had some health issues and has not been able to cook for him much lately. Since admission, he has been eating poorly because he does not like the food. He eats a few bites of meals, then friend (? wife) in room has been eating his meals. Nutrition-Focused physical exam completed. Findings are no fat depletion, no muscle depletion, and no edema. Patient agreed to try Ensure supplements between meals.   Diet Order:  Diet Heart Room service appropriate?: Yes; Fluid consistency:: Thin  Skin:  Reviewed, no issues  Last BM:  11/20  Height:   Ht Readings from Last 1 Encounters:  10/25/15 '5\' 10"'$  (1.778 m)    Weight:   Wt Readings from Last 1 Encounters:  10/27/15 173 lb 12.8 oz (78.835 kg)    Ideal Body Weight:  75.5 kg  BMI:  Body mass index is 24.94 kg/(m^2).  Estimated Nutritional Needs:   Kcal:  2000-2200  Protein:  100-120 gm  Fluid:  2-2.2 L  EDUCATION NEEDS:   Education needs addressed  Molli Barrows, [place], [place], [place] Pager [phone] After Hours Pager [phone]

## 2015-10-27 NOTE — Progress Notes (Signed)
[facility] Progress Note    Patient: Shaun May    [name]  DOB: 1951-08-28     DOA: 10/25/2015 Date of Service: the patient was seen and examined on 10/27/2015 Day  2 of admission.  Subjective: No Altamease Oiler of chest pain or abdominal pain. No acute events overnight. Nutrition: Able to tolerate oral diet. No nausea Activity: Ambulating in the room. Last BM: 10/27/2015  Assessment and Plan: 1. Heart block AV second degree Patient initially presented 10/25/2015 with flushing and high blood pressure. He was found to have 2:1 heart block on EKG. Cardiology is following up with the patient and ED will consider for pacemaker placement on Tuesday. Continue monitoring on telemetry. Avoid AV nodal blocking agents.  2. Accelerated hypertension Blood pressure improving Continue lisinopril and HOLD amlodipine. Continuing hydralazine ordered as well as when necessary hydralazine.  3  Hemochromatosis Serum ferritin 275. Stable. Continue monitoring.  4  Tobacco abuse Cessation consult. Continue nicotine patch.  5  Alcohol dependence (HCC) Multiple CIWA score 0.  Patient does not appear to be having any withdrawal at present. Continue telemetry monitoring as well as thiamine and folic acid.   6. History of panic attacks. Patient takes 2 mg lorazepam scheduled 4 times a day at home for last 7-8 years. Currently continuing the same.   DVT Prophylaxis: subcutaneous Heparin Nutrition: Cardiac diet   Advance goals of care discussion: Full code   Brief Summary of Hospitalization:  HPI:On 10/25/2015 the patient presented at an event hospital with complaints of high blood pressure. He also had some facial flushing. He was recently told that he continues to have high blood pressure 3 weeks ago as well. Patient was found to be having bradycardia with heart block and cardiology was consulted and the patient was transferred to [facility].  Daily update: 10/25/2015 cartilage  was consulted. EP consulted on 10/27/2015. Consultants: Cardiology, electrophysiology  Procedures: None   Family Communication: family was present at bedside, opportunity was given to ask question and all questions were answered satisfactorily at the time of interview.  Disposition:  Expected discharge date: 10/29/2015  Barriers to safe discharge: Pacemaker implantation procedure   Intake/Output Summary (Last 24 hours) at 10/27/15 1433 Last data filed at 10/27/15 1300  Gross per 24 hour  Intake   1195 ml  Output    450 ml  Net    745 ml   Filed Weights   10/25/15 1835 10/26/15 0500 10/27/15 0500  Weight: 80.06 kg (176 lb 8 oz) 79.47 kg (175 lb 3.2 oz) 78.835 kg (173 lb 12.8 oz)    Objective: Physical Exam: Filed Vitals:   10/27/15 0500 10/27/15 1023 10/27/15 1026 10/27/15 1415  BP: 133/64 129/63 129/63 153/87  Pulse: 48   59  Temp: 98.9 F (37.2 C)   97.9 F (36.6 C)  TempSrc:    Oral  Resp: 16   18  Height:      Weight: 78.835 kg (173 lb 12.8 oz)     SpO2: 98%   100%   General: Appear in mild distress, no Rash; Oral Mucosa moist. Cardiovascular: S1 and S2 Present, no Murmur, no JVD Respiratory: Bilateral Air entry present and Clear to Auscultation, no Crackles, no wheezes Abdomen: Bowel Sound present, Soft and no tenderness Extremities: no Pedal edema, no calf tenderness  Data Reviewed: CBC:  Recent Labs Lab 10/25/15 1418 10/27/15 0448  WBC 7.2 7.3  NEUTROABS 5.4 4.8  HGB 15.9 15.0  HCT 46.1 43.7  MCV 97.1  96.0  PLT 229 366   Basic Metabolic Panel:  Recent Labs Lab 10/25/15 1418 10/27/15 0448  NA 140 138  K 4.2 3.2*  CL 100* 100*  CO2 28 28  GLUCOSE 100* 99  BUN 12 6  CREATININE 0.70 0.70  CALCIUM 9.6 9.4  MG  --  2.0   Liver Function Tests:  Recent Labs Lab 10/25/15 1418 10/27/15 0448  AST 74* 64*  ALT 77* 67*  ALKPHOS 50 51  BILITOT 1.5* 2.1*  PROT 7.8 7.1  ALBUMIN 4.3 3.8   No results for input(s): LIPASE, AMYLASE in the last  168 hours. No results for input(s): AMMONIA in the last 168 hours.  Cardiac Enzymes:  Recent Labs Lab 10/25/15 1418  TROPONINI <0.03   BNP (last 3 results)  Recent Labs  10/25/15 1420  BNP 95.0    ProBNP (last 3 results) No results for input(s): PROBNP in the last 8760 hours.   CBG: No results for input(s): GLUCAP in the last 168 hours.  No results found for this or any previous visit (from the past 240 hour(s)).   Studies: No results found.   Scheduled Meds: . enoxaparin (LOVENOX) injection  40 mg Subcutaneous Q24H  . feeding supplement (ENSURE ENLIVE)  237 mL Oral BID BM  . folic acid  1 mg Oral Daily  . hydrALAZINE  25 mg Oral TID  . lisinopril  40 mg Oral Daily  . LORazepam  2 mg Oral QID  . multivitamin with minerals  1 tablet Oral Daily  . nicotine  21 mg Transdermal Q24H  . pneumococcal 23 valent vaccine  0.5 mL Intramuscular Tomorrow-1000  . potassium chloride SA  20 mEq Oral Daily  . sodium chloride  3 mL Intravenous Q12H  . thiamine  100 mg Oral Daily   Continuous Infusions:   Time spent: 30 minutes.  Author: Berle Mull, MD [facility] Pager: [phone] 10/27/2015 2:33 PM  If 7PM-7AM, please contact night-coverage at [email or web address], password [facility]

## 2015-10-28 ENCOUNTER — Encounter (HOSPITAL_COMMUNITY)
Admission: EM | Disposition: A | Discharge status: Home / Self Care | Payer: 59 | Source: Home / Self Care | Attending: Internal Medicine

## 2015-10-28 HISTORY — PX: EP IMPLANTABLE DEVICE: SHX172B

## 2015-10-28 LAB — CBC
HCT: 43.3 % (ref 39.0–52.0)
Hemoglobin: 14.7 g/dL (ref 13.0–17.0)
MCH: 32.7 pg (ref 26.0–34.0)
MCHC: 33.9 g/dL (ref 30.0–36.0)
MCV: 96.4 fL (ref 78.0–100.0)
PLATELETS: 199 10*3/uL (ref 150–400)
RBC: 4.49 MIL/uL (ref 4.22–5.81)
RDW: 13.1 % (ref 11.5–15.5)
WBC: 7.8 10*3/uL (ref 4.0–10.5)

## 2015-10-28 LAB — BASIC METABOLIC PANEL
Anion gap: 9 (ref 5–15)
BUN: 7 mg/dL (ref 6–20)
CO2: 27 mmol/L (ref 22–32)
CREATININE: 0.66 mg/dL (ref 0.61–1.24)
Calcium: 9.1 mg/dL (ref 8.9–10.3)
Chloride: 100 mmol/L — ABNORMAL LOW (ref 101–111)
GFR calc Af Amer: >60 mL/min (ref >60)
Glucose, Bld: 96 mg/dL (ref 65–99)
Potassium: 3.2 mmol/L — ABNORMAL LOW (ref 3.5–5.1)
SODIUM: 136 mmol/L (ref 135–145)

## 2015-10-28 LAB — MAGNESIUM: Magnesium: 2 mg/dL (ref 1.7–2.4)

## 2015-10-28 SURGERY — PACEMAKER IMPLANT

## 2015-10-28 MED ORDER — CEFAZOLIN SODIUM-DEXTROSE 2-3 GM-% IV SOLR
INTRAVENOUS | Status: DC | PRN
  Administered 2015-10-28: 2 g via INTRAVENOUS

## 2015-10-28 MED ORDER — LIDOCAINE HCL (PF) 1 % IJ SOLN
INTRAMUSCULAR | Status: AC
  Filled 2015-10-28: qty 60

## 2015-10-28 MED ORDER — FENTANYL CITRATE (PF) 100 MCG/2ML IJ SOLN
INTRAMUSCULAR | Status: AC
  Filled 2015-10-28: qty 2

## 2015-10-28 MED ORDER — FENTANYL CITRATE (PF) 100 MCG/2ML IJ SOLN
INTRAMUSCULAR | Status: DC | PRN
  Administered 2015-10-28: 25 ug via INTRAVENOUS

## 2015-10-28 MED ORDER — CEFAZOLIN SODIUM-DEXTROSE 2-3 GM-% IV SOLR
INTRAVENOUS | Status: AC
  Filled 2015-10-28: qty 50

## 2015-10-28 MED ORDER — CEFAZOLIN SODIUM 1-5 GM-% IV SOLN
1.0000 g | Freq: Four times a day (QID) | INTRAVENOUS | Status: AC
  Administered 2015-10-28 – 2015-10-29 (×3): 1 g via INTRAVENOUS
  Filled 2015-10-28 (×3): qty 50

## 2015-10-28 MED ORDER — SODIUM CHLORIDE 0.9 % IR SOLN
Status: AC
  Filled 2015-10-28: qty 2

## 2015-10-28 MED ORDER — ONDANSETRON HCL 4 MG/2ML IJ SOLN: 4.0000 mg | Freq: Four times a day (QID) | INTRAMUSCULAR | Status: DC | PRN

## 2015-10-28 MED ORDER — MIDAZOLAM HCL 5 MG/5ML IJ SOLN
INTRAMUSCULAR | Status: AC
  Filled 2015-10-28: qty 5

## 2015-10-28 MED ORDER — ACETAMINOPHEN 325 MG PO TABS: 325.0000 mg | ORAL_TABLET | ORAL | Status: DC | PRN

## 2015-10-28 MED ORDER — MIDAZOLAM HCL 5 MG/5ML IJ SOLN
INTRAMUSCULAR | Status: DC | PRN
  Administered 2015-10-28 (×2): 1 mg via INTRAVENOUS

## 2015-10-28 SURGICAL SUPPLY — 8 items
CABLE SURGICAL S-101-97-12 (CABLE) ×3 IMPLANT
LEAD CAPSURE NOVUS 5076-52CM (Lead) ×3 IMPLANT
LEAD CAPSURE NOVUS 5076-58CM (Lead) ×3 IMPLANT
PACEMAKER ADAPTA DR ADDRL1 (Pacemaker) ×1 IMPLANT
PAD DEFIB LIFELINK (PAD) ×3 IMPLANT
PPM ADAPTA DR ADDRL1 (Pacemaker) ×3 IMPLANT
SHEATH CLASSIC 7F (SHEATH) ×6 IMPLANT
TRAY PACEMAKER INSERTION (PACKS) ×3 IMPLANT

## 2015-10-28 NOTE — Progress Notes (Signed)
[facility] Progress Note    Patient: Shaun May    QHU:[id number]  DOB: 1951-03-14     DOA: 10/25/2015 Date of Service: the patient was seen and examined on 10/28/2015 Day  2 of admission.  Subjective:patient denies having any malaise or shortness of breath nausea vomiting or abdominal pain. No dizziness or lightheadedness. And she has to get the procedure done today. Nutrition: Able to tolerate oral diet. Activity: Ambulating in the room. Last BM: 10/27/2015  Assessment and Plan: 1. Heart block AV second degree Patient initially presented 10/25/2015 with flushing and high blood pressure at [facility]. He was found to have 2:1 heart block on EKG. Patient underwent placement of permanent pacemaker today. Continue monitoring on telemetry. We will follow recommendations from cardiology for postprocedure care.  2. Accelerated hypertension Blood pressure remains elevated. Continue lisinopril and HOLD amlodipine. Continuing hydralazine ordered as well as when necessary hydralazine.  3  Hemochromatosis Serum ferritin 275. Stable. Continue monitoring.  4  Tobacco abuse Cessation consult. Continue nicotine patch.  5  Alcohol dependence (HCC) Multiple CIWA score 0.  Patient does not appear to be having any withdrawal at present. Continue telemetry monitoring as well as thiamine and folic acid.   6. History of panic attacks. Patient takes 2 mg lorazepam scheduled 4 times a day at home for last 7-8 years. Currently continuing the same.   DVT Prophylaxis: subcutaneous Heparin Nutrition: Cardiac diet   Advance goals of care discussion: Full code   Brief Summary of Hospitalization:  HPI:On 10/25/2015 the patient presented at an event hospital with complaints of high blood pressure. He also had some facial flushing. He was recently told that he continues to have high blood pressure 3 weeks ago as well. Patient was found to be having bradycardia with heart block and  cardiology was consulted and the patient was transferred to [facility].  Daily update: 10/25/2015 cardiology was consulted.  EP consulted on 10/27/2015. 10/28/2015 underwent permanent pacemaker placement. Consultants: Cardiology, electrophysiology  Procedures: placement of permanent pacemaker  Family Communication: family was present at bedside, opportunity was given to ask question and all questions were answered satisfactorily at the time of interview.  Disposition:  Expected discharge date: 10/29/2015  Barriers to safe discharge: postprocedure care.   Intake/Output Summary (Last 24 hours) at 10/28/15 1740 Last data filed at 10/27/15 1800  Gross per 24 hour  Intake    240 ml  Output      0 ml  Net    240 ml   Filed Weights   10/26/15 0500 10/27/15 0500 10/28/15 0400  Weight: 79.47 kg (175 lb 3.2 oz) 78.835 kg (173 lb 12.8 oz) 79.47 kg (175 lb 3.2 oz)    Objective: Physical Exam: Filed Vitals:   10/28/15 1017 10/28/15 1319 10/28/15 1406 10/28/15 1642  BP: 187/90 156/62  170/76  Pulse:  37    Temp:  99 F (37.2 C)    TempSrc:  Oral    Resp:      Height:      Weight:      SpO2:  98% 100%    General: Appear in mild distress, no Rash; Oral Mucosa moist. Cardiovascular: S1 and S2 Present, no Murmur, no JVD Respiratory: Bilateral Air entry present and Clear to Auscultation, no Crackles, no wheezes Extremities: no Pedal edema, no calf tenderness  Data Reviewed: CBC:  Recent Labs Lab 10/25/15 1418 10/27/15 0448 10/28/15 0420  WBC 7.2 7.3 7.8  NEUTROABS 5.4 4.8  --  HGB 15.9 15.0 14.7  HCT 46.1 43.7 43.3  MCV 97.1 96.0 96.4  PLT 229 215 292   Basic Metabolic Panel:  Recent Labs Lab 10/25/15 1418 10/27/15 0448 10/28/15 0420  NA 140 138 136  K 4.2 3.2* 3.2*  CL 100* 100* 100*  CO2 '28 28 27  '$ GLUCOSE 100* 99 96  BUN '12 6 7  '$ CREATININE 0.70 0.70 0.66  CALCIUM 9.6 9.4 9.1  MG  --  2.0 2.0   Liver Function Tests:  Recent Labs Lab  10/25/15 1418 10/27/15 0448  AST 74* 64*  ALT 77* 67*  ALKPHOS 50 51  BILITOT 1.5* 2.1*  PROT 7.8 7.1  ALBUMIN 4.3 3.8   No results for input(s): LIPASE, AMYLASE in the last 168 hours. No results for input(s): AMMONIA in the last 168 hours.  Cardiac Enzymes:  Recent Labs Lab 10/25/15 1418  TROPONINI <0.03   BNP (last 3 results)  Recent Labs  10/25/15 1420  BNP 95.0    ProBNP (last 3 results) No results for input(s): PROBNP in the last 8760 hours.   CBG: No results for input(s): GLUCAP in the last 168 hours.  No results found for this or any previous visit (from the past 240 hour(s)).   Studies: No results found.   Scheduled Meds: .  ceFAZolin (ANCEF) IV  1 g Intravenous Q6H  . feeding supplement (ENSURE ENLIVE)  237 mL Oral BID BM  . folic acid  1 mg Oral Daily  . hydrALAZINE  25 mg Oral TID  . lisinopril  40 mg Oral Daily  . LORazepam  2 mg Oral QID  . multivitamin with minerals  1 tablet Oral Daily  . nicotine  21 mg Transdermal Q24H  . pneumococcal 23 valent vaccine  0.5 mL Intramuscular Tomorrow-1000  . potassium chloride SA  20 mEq Oral Daily  . sodium chloride  3 mL Intravenous Q12H  . thiamine  100 mg Oral Daily   Continuous Infusions:   Time spent: 30 minutes.  Author: Berle Mull, MD [facility] Pager: [phone] 10/28/2015 5:40 PM  If 7PM-7AM, please contact night-coverage at [email or web address], password [facility]

## 2015-10-28 NOTE — Discharge Instructions (Signed)
° ° °  Supplemental Discharge Instructions for  Pacemaker/Defibrillator Patients  Activity No heavy lifting or vigorous activity with your left/right arm for 6 to 8 weeks.  Do not raise your left/right arm above your head for one week.  Gradually raise your affected arm as drawn below.           __      11-02-15               11-03-15                  11-04-15                 11-05-15  NO DRIVING for   1 week  ; you may begin driving on   70-01-74  .  WOUND CARE - Keep the wound area clean and dry.  Do not get this area wet for one week. No showers for one week; you may shower on  11-05-15   . - The tape/steri-strips on your wound will fall off; do not pull them off.  No bandage is needed on the site.  DO  NOT apply any creams, oils, or ointments to the wound area. - If you notice any drainage or discharge from the wound, any swelling or bruising at the site, or you develop a fever > 101? F after you are discharged home, call the office at once.  Special Instructions - You are still able to use cellular telephones; use the ear opposite the side where you have your pacemaker/defibrillator.  Avoid carrying your cellular phone near your device. - When traveling through airports, show security personnel your identification card to avoid being screened in the metal detectors.  Ask the security personnel to use the hand wand. - Avoid arc welding equipment, MRI testing (magnetic resonance imaging), TENS units (transcutaneous nerve stimulators).  Call the office for questions about other devices. - Avoid electrical appliances that are in poor condition or are not properly grounded. - Microwave ovens are safe to be near or to operate.

## 2015-10-28 NOTE — Progress Notes (Signed)
Patient ID: HAYZE GAZDA, male   DOB: [birth year], 64 y.o.   MRN: [id number]    Patient Name: Shaun May Date of Encounter: 10/28/2015     Principal Problem:   Heart block AV second degree Active Problems:   Accelerated hypertension   Hemochromatosis   Tobacco abuse   Alcohol dependence (HCC)   Second degree AV block    SUBJECTIVE  No chest pain. Still feels weak  CURRENT MEDS .  ceFAZolin (ANCEF) IV  2 g Intravenous On Call  . feeding supplement (ENSURE ENLIVE)  237 mL Oral BID BM  . folic acid  1 mg Oral Daily  . gentamicin irrigation  80 mg Irrigation On Call  . hydrALAZINE  25 mg Oral TID  . lisinopril  40 mg Oral Daily  . LORazepam  2 mg Oral QID  . multivitamin with minerals  1 tablet Oral Daily  . nicotine  21 mg Transdermal Q24H  . pneumococcal 23 valent vaccine  0.5 mL Intramuscular Tomorrow-1000  . potassium chloride SA  20 mEq Oral Daily  . sodium chloride  3 mL Intravenous Q12H  . thiamine  100 mg Oral Daily    OBJECTIVE  Filed Vitals:   10/27/15 2341 10/28/15 0400 10/28/15 0847 10/28/15 1017  BP: 157/64 134/47 161/62 187/90  Pulse: 39 34 34   Temp: 97.9 F (36.6 C) 98.3 F (36.8 C) 98.2 F (36.8 C)   TempSrc: Oral Oral Oral   Resp: 18 18    Height:      Weight:  175 lb 3.2 oz (79.47 kg)    SpO2: 97% 96% 99%     Intake/Output Summary (Last 24 hours) at 10/28/15 1220 Last data filed at 10/27/15 1800  Gross per 24 hour  Intake    360 ml  Output    350 ml  Net     10 ml   Filed Weights   10/26/15 0500 10/27/15 0500 10/28/15 0400  Weight: 175 lb 3.2 oz (79.47 kg) 173 lb 12.8 oz (78.835 kg) 175 lb 3.2 oz (79.47 kg)    PHYSICAL EXAM  General: Pleasant, NAD. Neuro: Alert and oriented X 3. Moves all extremities spontaneously. Psych: Normal affect. HEENT:  Normal  Neck: Supple without bruits or JVD. Lungs:  Resp regular and unlabored, CTA. Heart: Reg brady no s3, s4, or murmurs. Abdomen: Soft, non-tender, non-distended, BS + x 4.    Extremities: No clubbing, cyanosis or edema. DP/PT/Radials 2+ and equal bilaterally.  Accessory Clinical Findings  CBC  Recent Labs  10/25/15 1418 10/27/15 0448 10/28/15 0420  WBC 7.2 7.3 7.8  NEUTROABS 5.4 4.8  --   HGB 15.9 15.0 14.7  HCT 46.1 43.7 43.3  MCV 97.1 96.0 96.4  PLT 229 215 297   Basic Metabolic Panel  Recent Labs  10/27/15 0448 10/28/15 0420  NA 138 136  K 3.2* 3.2*  CL 100* 100*  CO2 28 27  GLUCOSE 99 96  BUN 6 7  CREATININE 0.70 0.66  CALCIUM 9.4 9.1  MG 2.0 2.0   Liver Function Tests  Recent Labs  10/25/15 1418 10/27/15 0448  AST 74* 64*  ALT 77* 67*  ALKPHOS 50 51  BILITOT 1.5* 2.1*  PROT 7.8 7.1  ALBUMIN 4.3 3.8   No results for input(s): LIPASE, AMYLASE in the last 72 hours. Cardiac Enzymes  Recent Labs  10/25/15 1418  TROPONINI <0.03   BNP Invalid input(s): POCBNP D-Dimer No results for input(s): DDIMER in the last 72 hours.  Hemoglobin A1C No results for input(s): HGBA1C in the last 72 hours. Fasting Lipid Panel No results for input(s): CHOL, HDL, LDLCALC, TRIG, CHOLHDL, LDLDIRECT in the last 72 hours. Thyroid Function Tests  Recent Labs  10/25/15 1955  TSH 0.599    TELE  NSR with 2:1 AV block  Radiology/Studies  Dg Chest 2 View  10/25/2015  CLINICAL DATA:  Pt stated he felt flushed and his blood pressure was high today EXAM: CHEST  2 VIEW COMPARISON:  07/03/2014 FINDINGS: Lateral view degraded by patient arm position. Moderate thoracic spondylosis. Numerous leads and wires project over the chest. Midline trachea. Borderline cardiomegaly. Atherosclerosis in the transverse aorta. No pleural effusion or pneumothorax. Clear lungs. IMPRESSION: No acute cardiopulmonary disease. Aortic atherosclerosis. Electronically Signed   By: Abigail Miyamoto M.D.   On: 10/25/2015 14:33   Ct Head Wo Contrast  10/25/2015  CLINICAL DATA:  Headache.  Elevated blood pressure. EXAM: CT HEAD WITHOUT CONTRAST TECHNIQUE: Contiguous axial  images were obtained from the base of the skull through the vertex without intravenous contrast. COMPARISON:  None FINDINGS: Prominence of the sulci and ventricles identified. Mild nonspecific low attenuation within the subcortical and periventricular white matter identified compatible with chronic microvascular disease. No acute cortical infarct, hemorrhage, or mass lesion ispresent. Ventricles are of normal size. No significant extra-axial fluid collection is present. Moderate mucosal thickening involving the maxillaries sinuses, sphenoid sinus, ethmoid air cells noted. Mastoid air cells are clear. IMPRESSION: 1. No acute intracranial abnormalities. 2. Sinus opacification. Electronically Signed   By: Kerby Moors M.D.   On: 10/25/2015 14:48    ASSESSMENT AND PLAN  1. Symptomatic 2:1 AV block 2. HTN 3. H/o trifascicular block Rec: I have discussed the indications for PPM with the patient and he is willing to proceed.  Gregg Taylor,M.D.  10/28/2015 12:20 PM

## 2015-10-29 ENCOUNTER — Inpatient Hospital Stay (HOSPITAL_COMMUNITY): Payer: 59

## 2015-10-29 ENCOUNTER — Encounter (HOSPITAL_COMMUNITY): Payer: Self-pay | Admitting: Physician Assistant

## 2015-10-29 LAB — BASIC METABOLIC PANEL
Anion gap: 8 (ref 5–15)
BUN: 7 mg/dL (ref 6–20)
CO2: 26 mmol/L (ref 22–32)
CREATININE: 0.65 mg/dL (ref 0.61–1.24)
Calcium: 8.9 mg/dL (ref 8.9–10.3)
Chloride: 102 mmol/L (ref 101–111)
GFR calc non Af Amer: >60 mL/min (ref >60)
Glucose, Bld: 96 mg/dL (ref 65–99)
Potassium: 3.5 mmol/L (ref 3.5–5.1)
SODIUM: 136 mmol/L (ref 135–145)

## 2015-10-29 LAB — CBC
HEMATOCRIT: 40.9 % (ref 39.0–52.0)
Hemoglobin: 13.8 g/dL (ref 13.0–17.0)
MCH: 32.7 pg (ref 26.0–34.0)
MCHC: 33.7 g/dL (ref 30.0–36.0)
MCV: 96.9 fL (ref 78.0–100.0)
PLATELETS: 173 10*3/uL (ref 150–400)
RBC: 4.22 MIL/uL (ref 4.22–5.81)
RDW: 12.8 % (ref 11.5–15.5)
WBC: 8.4 10*3/uL (ref 4.0–10.5)

## 2015-10-29 MED ORDER — NICOTINE 21 MG/24HR TD PT24: 21.0000 mg | MEDICATED_PATCH | TRANSDERMAL | Status: DC

## 2015-10-29 MED ORDER — HYDRALAZINE HCL 25 MG PO TABS: 25.0000 mg | ORAL_TABLET | Freq: Three times a day (TID) | ORAL | Status: DC

## 2015-10-29 MED ORDER — HYDRALAZINE HCL 50 MG PO TABS: 50.0000 mg | ORAL_TABLET | Freq: Three times a day (TID) | ORAL | Status: DC

## 2015-10-29 MED FILL — Lidocaine HCl Local Preservative Free (PF) Inj 1%: INTRAMUSCULAR | Qty: 30 | Status: AC

## 2015-10-29 MED FILL — Sodium Chloride Irrigation Soln 0.9%: Qty: 500 | Status: AC

## 2015-10-29 MED FILL — Gentamicin Sulfate Inj 40 MG/ML: INTRAMUSCULAR | Qty: 2 | Status: AC

## 2015-10-29 NOTE — Progress Notes (Addendum)
SUBJECTIVE: The patient is doing well today.  At this time, he denies chest pain, shortness of breath, or any new concerns. He is s/p PPM implant yesterday, he denies any significant pain at the site, no CP, palpitations or SOB.  Marland Kitchen  ceFAZolin (ANCEF) IV  1 g Intravenous Q6H  . feeding supplement (ENSURE ENLIVE)  237 mL Oral BID BM  . folic acid  1 mg Oral Daily  . hydrALAZINE  25 mg Oral TID  . lisinopril  40 mg Oral Daily  . LORazepam  2 mg Oral QID  . multivitamin with minerals  1 tablet Oral Daily  . nicotine  21 mg Transdermal Q24H  . pneumococcal 23 valent vaccine  0.5 mL Intramuscular Tomorrow-1000  . potassium chloride SA  20 mEq Oral Daily  . sodium chloride  3 mL Intravenous Q12H  . thiamine  100 mg Oral Daily      OBJECTIVE: Physical Exam: Filed Vitals:   10/28/15 2012 10/28/15 2052 10/29/15 0000 10/29/15 0600  BP: 142/85  142/70 140/84  Pulse: 73  80 76  Temp:  98.1 F (36.7 C)  97.8 F (36.6 C)  TempSrc:  Oral  Oral  Resp:      Height:      Weight:    172 lb 13.5 oz (78.4 kg)  SpO2: 97%   100%    Intake/Output Summary (Last 24 hours) at 10/29/15 0713 Last data filed at 10/29/15 8003  Gross per 24 hour  Intake   1475 ml  Output    375 ml  Net   1100 ml    Telemetry reveals SR, V paced  GEN- The patient is well appearing, alert and oriented x 3 today.   Head- normocephalic, atraumatic Eyes-  Sclera clear, conjunctiva pink Ears- hearing intact Neck- supple, no JVP Lungs- Clear to ausculation bilaterally, normal work of breathing Heart- Regular rate and rhythm, no significant murmurs, no rubs or gallops GI- soft, NT, ND, + BS Extremities- no clubbing, cyanosis, or edema Skin- no rash or lesion Psych- euthymic mood, full affect Neuro- no gross deficits appreciated PPM site is dry, no hematoma or ecchymosis  LABS: Basic Metabolic Panel:  Recent Labs  10/27/15 0448 10/28/15 0420 10/29/15 0325  NA 138 136 136  K 3.2* 3.2* 3.5  CL 100* 100*  102  CO2 '28 27 26  '$ GLUCOSE 99 96 96  BUN '6 7 7  '$ CREATININE 0.70 0.66 0.65  CALCIUM 9.4 9.1 8.9  MG 2.0 2.0  --    Liver Function Tests:  Recent Labs  10/27/15 0448  AST 64*  ALT 67*  ALKPHOS 51  BILITOT 2.1*  PROT 7.1  ALBUMIN 3.8   CBC:  Recent Labs  10/27/15 0448 10/28/15 0420 10/29/15 0325  WBC 7.3 7.8 8.4  NEUTROABS 4.8  --   --   HGB 15.0 14.7 13.8  HCT 43.7 43.3 40.9  MCV 96.0 96.4 96.9  PLT 215 199 173    Anemia Panel:  Recent Labs  10/26/15 0905  FERRITIN 275    RADIOLOGY: 10/29/15 CXR: IMPRESSION: 1. No acute cardiopulmonary abnormality. 2. No pneumothorax following pacer placement.  DEVICE information:  Medtronic (serial number [id number]) pacemaker, Medtronic [id number] (serial number [id number]) right atrial lead and a Medtronic [id number] (serial number [id number]) right ventricular lead     ASSESSMENT AND PLAN:  Principal Problem:   Heart block AV second degree Active Problems:   Accelerated hypertension   Hemochromatosis   Tobacco abuse  Alcohol dependence (HCC)   Second degree AV block  1. 2:1 Heart block     No reversible causes, s/p PPM implant yesterday with Dr. Lovena Le     Site is stable      Wound check and f/u appointments arranged for the patient     Wound care and arm instructions discussed with the patient     CXR post implant is without pneumothorax     Device check this morning with normal function   2. Hemocromatosis 3. HTN 4. Tobacco/ETOH abuse, abn LFTs      Pt has been counseled  Electrophysiology team to see as needed while here. Please call with questions.   Tommye Standard, PA-C 10/29/2015 7:13 AM   EP Attending  Patient seen and examined. Agree with the findings above. Ready for DC. Usual followup.  Mikle Bosworth.D.

## 2015-10-29 NOTE — Discharge Summary (Signed)
Physician Discharge Summary   Patient ID: Shaun May MRN: [id number] DOB/AGE: May 02, 1951 64 y.o.  Admit date: 10/25/2015 Discharge date: 10/29/2015  Primary Care Physician:  Alonza Bogus, MD  Discharge Diagnoses:    . Heart block AV second degree status post pacemaker  . Accelerated hypertension . Hemochromatosis . Tobacco abuse . Alcohol dependence ([place])   Consults: Cardiology, Dr. Crissie Sickles  Recommendations for Outpatient Follow-up:  1. Please repeat CBC/BMET at next visit    TESTS THAT NEED FOLLOW-UP CBC, BMET   DIET: Heart healthy diet    Allergies:  No Known Allergies   DISCHARGE MEDICATIONS: Current Discharge Medication List    START taking these medications   Details  hydrALAZINE (APRESOLINE) 50 MG tablet Take 1 tablet (50 mg total) by mouth 3 (three) times daily. Qty: 90 tablet, Refills: 3    nicotine (NICODERM CQ - DOSED IN MG/24 HOURS) 21 mg/24hr patch Place 1 patch (21 mg total) onto the skin daily. Qty: 28 patch, Refills: 2      CONTINUE these medications which have NOT CHANGED   Details  HYDROcodone-acetaminophen (NORCO) 10-325 MG tablet Take 1 tablet by mouth every 6 (six) hours as needed for moderate pain.    lisinopril (PRINIVIL,ZESTRIL) 20 MG tablet Take 2 tablets by mouth daily.    LORazepam (ATIVAN) 2 MG tablet Take 1 tablet by mouth 4 (four) times daily.     potassium chloride SA (K-DUR,KLOR-CON) 20 MEQ tablet Take 1 tablet by mouth daily.      STOP taking these medications     amLODipine (NORVASC) 10 MG tablet          Brief H and P: For complete details please refer to admission H and P, but in brief On 10/25/2015 the patient presented at an event hospital with complaints of high blood pressure. He also had some facial flushing. Patient had gone to [employer] to donate blood to 3 weeks ago and was told that he had very high blood pressure. At the time of admission, BP was 213/64, heart rate was consistently in 30s.  EKG showed 2:1 heart block. Patient was transferred to [facility] Course:  1. Heart block AV second degree Patient initially presented 10/25/2015 with flushing and high blood pressure at [facility] and was incidentally found to have bradycardia with heart rate in 30s, 2:1 heart block on EKG. patient was transferred to [facility]. Cardiology was consulted. Patient was seen by EP, Dr. Lovena Le and permanent pacemaker was placed on 11/22. Patient is now doing well, he is cleared from the EP/cardiology standpoint to be discharged home. Follow-up has been scheduled. 2-D echo showed EF of 37-10%, grade 1 diastolic dysfunction, normal wall motion.  2. Accelerated hypertension- now improving Blood pressure remained elevated, patient was continued on lisinopril, hydralazine was added.  3 Hemochromatosis Serum ferritin 275. Stable. Continue monitoring.  4 Tobacco abuse Cessation consult. Continue nicotine patch.  5 Alcohol dependence (HCC) Multiple CIWA score 0. Patient does not appear to be having any withdrawal at present.   6. History of panic attacks. Patient takes 2 mg lorazepam scheduled 4 times a day at home for last 7-8 years. Currently continuing the same.    Day of Discharge BP 140/84 mmHg  Pulse 76  Temp(Src) 97.8 F (36.6 C) (Oral)  Resp 18  Ht '5\' 10"'$  (1.778 m)  Wt 78.4 kg (172 lb 13.5 oz)  BMI 24.80 kg/m2  SpO2 100%  Physical Exam: General: Alert  and awake oriented x3 not in any acute distress. HEENT: anicteric sclera, pupils reactive to light and accommodation CVS: S1-S2 clear no murmur rubs or gallops Chest: clear to auscultation bilaterally, no wheezing rales or rhonchi Abdomen: soft nontender, nondistended, normal bowel sounds Extremities: no cyanosis, clubbing or edema noted bilaterally Neuro: Cranial nerves II-XII intact, no focal neurological deficits   The results of significant diagnostics from this hospitalization  (including imaging, microbiology, ancillary and laboratory) are listed below for reference.    LAB RESULTS: Basic Metabolic Panel:  Recent Labs Lab 10/28/15 0420 10/29/15 0325  NA 136 136  K 3.2* 3.5  CL 100* 102  CO2 27 26  GLUCOSE 96 96  BUN 7 7  CREATININE 0.66 0.65  CALCIUM 9.1 8.9  MG 2.0  --    Liver Function Tests:  Recent Labs Lab 10/25/15 1418 10/27/15 0448  AST 74* 64*  ALT 77* 67*  ALKPHOS 50 51  BILITOT 1.5* 2.1*  PROT 7.8 7.1  ALBUMIN 4.3 3.8   No results for input(s): LIPASE, AMYLASE in the last 168 hours. No results for input(s): AMMONIA in the last 168 hours. CBC:  Recent Labs Lab 10/27/15 0448 10/28/15 0420 10/29/15 0325  WBC 7.3 7.8 8.4  NEUTROABS 4.8  --   --   HGB 15.0 14.7 13.8  HCT 43.7 43.3 40.9  MCV 96.0 96.4 96.9  PLT 215 199 173   Cardiac Enzymes:  Recent Labs Lab 10/25/15 1418  TROPONINI <0.03   BNP: Invalid input(s): POCBNP CBG: No results for input(s): GLUCAP in the last 168 hours.  Significant Diagnostic Studies:  Dg Chest 2 View  10/25/2015  CLINICAL DATA:  Pt stated he felt flushed and his blood pressure was high today EXAM: CHEST  2 VIEW COMPARISON:  07/03/2014 FINDINGS: Lateral view degraded by patient arm position. Moderate thoracic spondylosis. Numerous leads and wires project over the chest. Midline trachea. Borderline cardiomegaly. Atherosclerosis in the transverse aorta. No pleural effusion or pneumothorax. Clear lungs. IMPRESSION: No acute cardiopulmonary disease. Aortic atherosclerosis. Electronically Signed   By: Abigail Miyamoto M.D.   On: 10/25/2015 14:33   Ct Head Wo Contrast  10/25/2015  CLINICAL DATA:  Headache.  Elevated blood pressure. EXAM: CT HEAD WITHOUT CONTRAST TECHNIQUE: Contiguous axial images were obtained from the base of the skull through the vertex without intravenous contrast. COMPARISON:  None FINDINGS: Prominence of the sulci and ventricles identified. Mild nonspecific low attenuation within  the subcortical and periventricular white matter identified compatible with chronic microvascular disease. No acute cortical infarct, hemorrhage, or mass lesion ispresent. Ventricles are of normal size. No significant extra-axial fluid collection is present. Moderate mucosal thickening involving the maxillaries sinuses, sphenoid sinus, ethmoid air cells noted. Mastoid air cells are clear. IMPRESSION: 1. No acute intracranial abnormalities. 2. Sinus opacification. Electronically Signed   By: Kerby Moors M.D.   On: 10/25/2015 14:48    2D ECHO: Study Conclusions  - Left ventricle: The cavity size was normal. Systolic function was normal. The estimated ejection fraction was in the range of 55% to 60%. Wall motion was normal; there were no regional wall motion abnormalities. Doppler parameters are consistent with abnormal left ventricular relaxation (grade 1 diastolic dysfunction). - Mitral valve: Mildly calcified annulus. - Left atrium: The atrium was mildly dilated.  Disposition and Follow-up: Discharge Instructions    Diet - low sodium heart healthy    Complete by:  As directed      Increase activity slowly    Complete by:  As directed             DISPOSITION: Home   DISCHARGE FOLLOW-UP Follow-up Information    Follow up with [facility] On 11/06/2015.   Specialty:  Cardiology   Why:  at 11AM for wound check    Contact information:   [address], [place] [phone]      Follow up with [name], MD On 02/13/2016.   Specialty:  Cardiology   Why:  at [place] information:   [place] [address] [phone]       Follow up with HAWKINS,EDWARD L, MD. Schedule an appointment as soon as possible for a visit in 2 weeks.   Specialty:  Pulmonary Disease   Why:  for hospital follow-up   Contact information:   [place]  [address] [phone]         Time spent on Discharge: 35 minutes  Signed:   RAI,RIPUDEEP M.D. [facility] 10/29/2015, 11:44 AM Pager: [phone]

## 2015-11-05 ENCOUNTER — Telehealth: Payer: Self-pay | Admitting: Internal Medicine

## 2015-11-05 NOTE — Telephone Encounter (Signed)
Returned patient's call.  Advised him that he can remove his outer bandage, but that he should keep the Steri-strips on.  Patient looked at the site while on the phone at my request and states that it appears more purple and possibly more swollen than it has been, his wife in the background agreed.  Advised him to keep his dressing in place and to avoid showering until his wound can be checked thoroughly tomorrow.  He verbalizes understanding of instructions.  He denies pain, fever, chills, drainage from site, or any other signs/symptoms of infection.  Patient is aware to proceed to the ED with worsening symptoms overnight and to keep his appointment tomorrow, 11/06/15 at 11:00am.  He is appreciative of return call and denies questions or concerns at this time.

## 2015-11-05 NOTE — Telephone Encounter (Signed)
New message      Pt is coming in tomorrow for a wound check.   Can he take a shower tonight?  He still has all of his bandages on.

## 2015-11-06 ENCOUNTER — Encounter: Payer: Self-pay | Admitting: Internal Medicine

## 2015-11-06 ENCOUNTER — Encounter: Payer: Self-pay | Admitting: *Deleted

## 2015-11-06 ENCOUNTER — Ambulatory Visit (INDEPENDENT_AMBULATORY_CARE_PROVIDER_SITE_OTHER): Payer: 59 | Admitting: *Deleted

## 2015-11-06 DIAGNOSIS — I441 Atrioventricular block, second degree: Secondary | ICD-10-CM

## 2015-11-06 DIAGNOSIS — R001 Bradycardia, unspecified: Secondary | ICD-10-CM

## 2015-11-06 LAB — CUP PACEART INCLINIC DEVICE CHECK
Battery Remaining Longevity: 121 mo
Battery Voltage: 2.79 V
Brady Statistic AP VP Percent: 2 %
Brady Statistic AS VP Percent: 98 %
Date Time Interrogation Session: 20161201140841
Implantable Lead Implant Date: 20161122
Implantable Lead Location: 753860
Implantable Lead Model: 5076
Lead Channel Impedance Value: 632 Ohm
Lead Channel Pacing Threshold Amplitude: 0.5 V
Lead Channel Pacing Threshold Pulse Width: 0.4 ms
Lead Channel Sensing Intrinsic Amplitude: 8 mV
Lead Channel Setting Pacing Amplitude: 3.5 V
Lead Channel Setting Sensing Sensitivity: 4 mV
MDC IDC LEAD IMPLANT DT: 20161122
MDC IDC LEAD LOCATION: 753859
MDC IDC MSMT BATTERY IMPEDANCE: 100 Ohm
MDC IDC MSMT LEADCHNL RA IMPEDANCE VALUE: 642 Ohm
MDC IDC MSMT LEADCHNL RA PACING THRESHOLD AMPLITUDE: 0.5 V
MDC IDC MSMT LEADCHNL RA PACING THRESHOLD PULSEWIDTH: 0.4 ms
MDC IDC MSMT LEADCHNL RA SENSING INTR AMPL: 4 mV
MDC IDC MSMT LEADCHNL RV PACING THRESHOLD AMPLITUDE: 0.5 V
MDC IDC MSMT LEADCHNL RV PACING THRESHOLD AMPLITUDE: 0.625 V
MDC IDC MSMT LEADCHNL RV PACING THRESHOLD PULSEWIDTH: 0.4 ms
MDC IDC MSMT LEADCHNL RV PACING THRESHOLD PULSEWIDTH: 0.4 ms
MDC IDC SET LEADCHNL RV PACING AMPLITUDE: 3.5 V
MDC IDC SET LEADCHNL RV PACING PULSEWIDTH: 0.4 ms
MDC IDC STAT BRADY AP VS PERCENT: 0 %
MDC IDC STAT BRADY AS VS PERCENT: 0 %

## 2015-11-06 NOTE — Progress Notes (Signed)
Wound check appointment. Steri-strips removed. Hematoma present. Patient states that is has gotten larger since discharge from hosp; patient does not take any anticoagulants. Dr.Taylor assessed the wound and explained to patient the risks and benefits of pocket evacuation versus watchful waiting. Patient voiced understanding and prefers to undergo procedure. Pocket evacuation scheduled for 12/2 '@2pm'$  w/GT. Patient to f/u w/ device clinic on 12/5 '@1100'$  for  pressure dressing removal. Instruction sheet and verbal instructions given to patient.  Pacemaker check in clinic. Normal device function. Thresholds, sensing, and impedances consistent with implant measurements. Device programmed at 3.5V for extra safety margin until 3 month visit. Histogram distribution appropriate for patient and level of activity. (12) mode switches (<0.1%)---all <30 sec, no EGMs. No high ventricular rates noted. Patient educated about wound care, arm mobility, lifting restrictions. Dr.Taylor advised patient to gently wash wound with warm soap and water tonight and tomorrow morning. Patient voiced understanding of instructions.

## 2015-11-07 ENCOUNTER — Encounter (HOSPITAL_COMMUNITY)
Admission: RE | Disposition: A | Discharge status: Home / Self Care | Payer: 59 | Source: Ambulatory Visit | Attending: Internal Medicine

## 2015-11-07 ENCOUNTER — Ambulatory Visit (HOSPITAL_COMMUNITY)
Admission: RE | Admit: 2015-11-07 | Discharge: 2015-11-07 | Disposition: A | Discharge status: Home / Self Care | Payer: 59 | Source: Ambulatory Visit | Attending: Internal Medicine | Admitting: Internal Medicine

## 2015-11-07 DIAGNOSIS — F17210 Nicotine dependence, cigarettes, uncomplicated: Secondary | ICD-10-CM | POA: Diagnosis not present

## 2015-11-07 DIAGNOSIS — R001 Bradycardia, unspecified: Secondary | ICD-10-CM | POA: Insufficient documentation

## 2015-11-07 DIAGNOSIS — Y713 Surgical instruments, materials and cardiovascular devices (including sutures) associated with adverse incidents: Secondary | ICD-10-CM | POA: Insufficient documentation

## 2015-11-07 DIAGNOSIS — T82837D Hemorrhage of cardiac prosthetic devices, implants and grafts, subsequent encounter: Secondary | ICD-10-CM | POA: Diagnosis not present

## 2015-11-07 DIAGNOSIS — I10 Essential (primary) hypertension: Secondary | ICD-10-CM | POA: Insufficient documentation

## 2015-11-07 DIAGNOSIS — I442 Atrioventricular block, complete: Secondary | ICD-10-CM | POA: Diagnosis not present

## 2015-11-07 DIAGNOSIS — I97638 Postprocedural hematoma of a circulatory system organ or structure following other circulatory system procedure: Secondary | ICD-10-CM | POA: Diagnosis not present

## 2015-11-07 HISTORY — PX: EP IMPLANTABLE DEVICE: SHX172B

## 2015-11-07 LAB — SURGICAL PCR SCREEN
MRSA, PCR: NEGATIVE
Staphylococcus aureus: NEGATIVE

## 2015-11-07 SURGERY — POCKET REVISION/RELOCATION
Anesthesia: LOCAL

## 2015-11-07 MED ORDER — SODIUM CHLORIDE 0.9 % IR SOLN
80.0000 mg | Status: DC
  Filled 2015-11-07: qty 2

## 2015-11-07 MED ORDER — SODIUM CHLORIDE 0.9 % IV SOLN
INTRAVENOUS | Status: DC
  Administered 2015-11-07: 13:00:00 via INTRAVENOUS

## 2015-11-07 MED ORDER — LIDOCAINE HCL (PF) 1 % IJ SOLN
INTRAMUSCULAR | Status: AC
  Filled 2015-11-07: qty 60

## 2015-11-07 MED ORDER — CEFAZOLIN SODIUM-DEXTROSE 2-3 GM-% IV SOLR: 2.0000 g | INTRAVENOUS | Status: DC

## 2015-11-07 MED ORDER — FENTANYL CITRATE (PF) 100 MCG/2ML IJ SOLN
INTRAMUSCULAR | Status: DC | PRN
  Administered 2015-11-07: 25 ug via INTRAVENOUS
  Administered 2015-11-07: 12.5 ug via INTRAVENOUS
  Administered 2015-11-07 (×2): 25 ug via INTRAVENOUS
  Administered 2015-11-07: 12.5 ug via INTRAVENOUS

## 2015-11-07 MED ORDER — FENTANYL CITRATE (PF) 100 MCG/2ML IJ SOLN
INTRAMUSCULAR | Status: AC
  Filled 2015-11-07: qty 2

## 2015-11-07 MED ORDER — CEFAZOLIN SODIUM-DEXTROSE 2-3 GM-% IV SOLR
INTRAVENOUS | Status: DC | PRN
  Administered 2015-11-07: 2 g via INTRAVENOUS

## 2015-11-07 MED ORDER — MIDAZOLAM HCL 5 MG/5ML IJ SOLN
INTRAMUSCULAR | Status: DC | PRN
  Administered 2015-11-07: 2 mg via INTRAVENOUS
  Administered 2015-11-07 (×2): 1 mg via INTRAVENOUS
  Administered 2015-11-07: 2 mg via INTRAVENOUS

## 2015-11-07 MED ORDER — MUPIROCIN 2 % EX OINT
TOPICAL_OINTMENT | CUTANEOUS | Status: AC
  Administered 2015-11-07: 1 via TOPICAL
  Filled 2015-11-07: qty 22

## 2015-11-07 MED ORDER — SODIUM CHLORIDE 0.9 % IR SOLN
Status: AC
  Filled 2015-11-07: qty 2

## 2015-11-07 MED ORDER — CHLORHEXIDINE GLUCONATE 4 % EX LIQD
60.0000 mL | Freq: Once | CUTANEOUS | Status: DC
  Filled 2015-11-07: qty 60

## 2015-11-07 MED ORDER — MIDAZOLAM HCL 5 MG/5ML IJ SOLN
INTRAMUSCULAR | Status: AC
  Filled 2015-11-07: qty 5

## 2015-11-07 MED ORDER — MUPIROCIN 2 % EX OINT
1.0000 "application " | TOPICAL_OINTMENT | Freq: Once | CUTANEOUS | Status: AC
  Administered 2015-11-07: 1 via TOPICAL
  Filled 2015-11-07: qty 22

## 2015-11-07 MED ORDER — CEFAZOLIN SODIUM-DEXTROSE 2-3 GM-% IV SOLR
INTRAVENOUS | Status: AC
  Filled 2015-11-07: qty 50

## 2015-11-07 SURGICAL SUPPLY — 3 items
CABLE SURGICAL S-101-97-12 (CABLE) IMPLANT
PAD DEFIB LIFELINK (PAD) ×2 IMPLANT
TRAY PACEMAKER INSERTION (PACKS) ×2 IMPLANT

## 2015-11-07 NOTE — H&P (Signed)
   Shaun May is an 64 y.o. male.   Chief Complaint: "I came in to have my pacemaker pocket drained" HPI: The patient is a very pleasant man with a history of complete heart block, who underwent permanent pacemaker insertion several weeks ago. He developed an increasingly large pacemaker pocket hematoma. He has had no fevers and chills or other infectious symptoms. He is admitted now to undergo pacemaker pocket revision with hematoma evacuation.  Past Medical History  Diagnosis Date  . Hypertension   . Hemochromatosis   . Mobitz type 2 second degree heart block     PPM MDT 10/28/15 Dr. Lovena Le    Past Surgical History  Procedure Laterality Date  . Ep implantable device N/A 10/28/2015    Procedure: Pacemaker Implant;  Surgeon: Evans Lance, MD;  Location: [facility];  Service: Cardiovascular;  Laterality: N/A;    Family History  Problem Relation Age of Onset  . Diabetes Other    Social History:  reports that he has been smoking Cigarettes.  He has been smoking about 2.50 packs per day. He does not have any smokeless tobacco history on file. He reports that he drinks about 12.6 oz of alcohol per week. He reports that he does not use illicit drugs.  Allergies: No Known Allergies  Medications Prior to Admission  Medication Sig Dispense Refill  . hydrALAZINE (APRESOLINE) 50 MG tablet Take 1 tablet (50 mg total) by mouth 3 (three) times daily. 90 tablet 3  . HYDROcodone-acetaminophen (NORCO) 10-325 MG tablet Take 1 tablet by mouth every 6 (six) hours as needed for moderate pain.    Marland Kitchen lisinopril (PRINIVIL,ZESTRIL) 40 MG tablet Take 40 mg by mouth daily.    Marland Kitchen LORazepam (ATIVAN) 2 MG tablet Take 1 tablet by mouth 4 (four) times daily.     . nicotine (NICODERM CQ - DOSED IN MG/24 HOURS) 21 mg/24hr patch Place 1 patch (21 mg total) onto the skin daily. (Patient not taking: Reported on 11/06/2015) 28 patch 2  . potassium chloride SA (K-DUR,KLOR-CON) 20 MEQ tablet Take 1 tablet by  mouth daily.      No results found for this or any previous visit (from the past 48 hour(s)). No results found.  ROS - all systems reviewed and negative except for discomfort and swelling at his pacemaker insertion site  Blood pressure 172/81, pulse 68, temperature 97.7 F (36.5 C), temperature source Oral, resp. rate 18, height '5\' 10"'$  (1.778 m), weight 175 lb (79.379 kg), SpO2 98 %. Physical Exam  Well appearing man, NAD HEENT: Unremarkable,Shaun May, AT Neck:  6 JVD, no thyromegally Back:  No CVA tenderness Lungs:  Clear with no wheezes, rales, or rhonchi HEART:  Regular rate rhythm, no murmurs, no rubs, no clicks Abd:  soft, positive bowel sounds, no organomegally, no rebound, no guarding Ext:  2 plus pulses, no edema, no cyanosis, no clubbing Skin:  No rashes no nodules Neuro:  CN II through XII intact, motor grossly intact EKG: normal EKG, normal sinus rhythm, unchanged from previous tracings.  Assessment/Plan 1. Pacemaker pocket hematoma 2. Symptomatic Mobitz 2 second-degree AV block, status post pacemaker insertion 3. Hypertension We'll plan to bring the patient in for pacemaker pocket evacuation. The risk, goals, benefits, and expectations of the procedure have been discussed with the patient and he wishes to proceed.  Shaun May 11/07/2015, 12:21 PM

## 2015-11-07 NOTE — Discharge Instructions (Signed)
Pacemaker Battery Change, Care After Refer to this sheet in the next few weeks. These instructions provide you with information on caring for yourself after your procedure. Your health care provider may also give you more specific instructions. Your treatment has been planned according to current medical practices, but problems sometimes occur. Call your health care provider if you have any problems or questions after your procedure. WHAT TO EXPECT AFTER THE PROCEDURE After your procedure, it is typical to have the following sensations:  Soreness at the pacemaker site. HOME CARE INSTRUCTIONS   Keep the incision clean and dry.  Unless advised otherwise, you may shower beginning 48 hours after your procedure.  For the first week after the replacement, avoid stretching motions that pull at the incision site, and avoid heavy exercise with the arm that is on the same side as the incision.  Take medicines only as directed by your health care provider.  Keep all follow-up visits as directed by your health care provider. SEEK MEDICAL CARE IF:   You have pain at the incision site that is not relieved by over-the-counter or prescription medicine.  There is drainage or pus from the incision site.  There is swelling larger than a lime at the incision site.  You develop red streaking that extends above or below the incision site.  You feel brief, intermittent palpitations, light-headedness, or any symptoms that you feel might be related to your heart. SEEK IMMEDIATE MEDICAL CARE IF:   You experience chest pain that is different than the pain at the pacemaker site.  You experience shortness of breath.  You have palpitations or irregular heartbeat.  You have light-headedness that does not go away quickly.  You faint.  You have pain that gets worse and is not relieved by medicine.   This information is not intended to replace advice given to you by your health care provider. Make sure you  discuss any questions you have with your health care provider.   Document Released: 09/12/2013 Document Revised: 12/13/2014 Document Reviewed: 09/12/2013 Elsevier Interactive Patient Education [employer].

## 2015-11-10 ENCOUNTER — Ambulatory Visit (INDEPENDENT_AMBULATORY_CARE_PROVIDER_SITE_OTHER): Payer: 59 | Admitting: *Deleted

## 2015-11-10 ENCOUNTER — Encounter (HOSPITAL_COMMUNITY): Payer: Self-pay | Admitting: Internal Medicine

## 2015-11-10 DIAGNOSIS — I441 Atrioventricular block, second degree: Secondary | ICD-10-CM

## 2015-11-10 NOTE — Progress Notes (Signed)
Patient presents to the office for pressure dressing removal (N/C). Minimal edema noted. Green/yellow ecchymosis also noted around device area. Patient to f/u with the device clinic on 12/9 @ 1100 to remove steri strips.

## 2015-11-12 MED FILL — Gentamicin Sulfate Inj 40 MG/ML: INTRAMUSCULAR | Qty: 2 | Status: AC

## 2015-11-12 MED FILL — Sodium Chloride Irrigation Soln 0.9%: Qty: 500 | Status: AC

## 2015-11-12 MED FILL — Lidocaine HCl Local Preservative Free (PF) Inj 1%: INTRAMUSCULAR | Qty: 60 | Status: AC

## 2015-11-14 ENCOUNTER — Ambulatory Visit (INDEPENDENT_AMBULATORY_CARE_PROVIDER_SITE_OTHER): Payer: 59 | Admitting: *Deleted

## 2015-11-14 ENCOUNTER — Encounter: Payer: Self-pay | Admitting: *Deleted

## 2015-11-14 VITALS — BP 193/83 | HR 77

## 2015-11-14 DIAGNOSIS — R001 Bradycardia, unspecified: Secondary | ICD-10-CM

## 2015-11-14 DIAGNOSIS — I441 Atrioventricular block, second degree: Secondary | ICD-10-CM

## 2015-11-14 MED ORDER — CARVEDILOL 6.25 MG PO TABS: 6.2500 mg | ORAL_TABLET | Freq: Two times a day (BID) | ORAL | Status: DC

## 2015-11-14 NOTE — Progress Notes (Signed)
Wound check in clinic s/p ppm pocket evacuation due to hematoma. Steri strips removed. Wound well healed without redness. Incision edges are approximated. Small hematoma noted with yellow/green ecchymosis roughly 2 inches below the incision. Wound assessed by Dr.Taylor-no further interventions needed.   Patient also presented with c/o of an elevated BP reading in the mornings. BP taken today during appt and read 193/83 HR: 77bpm. Patient denies any sx's with elevated reading.   Dr.Taylor started patient on Coreg 6.25BID (Rx sent to pharmacy). Dr.Taylor discussed potential adverse effects of medication, such as lethargy and dizziness, and the duration of time the adverse effects could potentially last. Patient encouraged not to abruptly d/c medication if adverse effects felt, but to call and notify MD. Patient voiced understanding.  Patient to f/u w/ Dr.Taylor in 3 months as scheduled.

## 2016-01-06 ENCOUNTER — Emergency Department (HOSPITAL_COMMUNITY)
Admission: EM | Admit: 2016-01-06 | Discharge: 2016-01-06 | Disposition: A | Discharge status: Home / Self Care | Payer: BLUE CROSS/BLUE SHIELD | Attending: Emergency Medicine | Admitting: Emergency Medicine

## 2016-01-06 ENCOUNTER — Encounter (HOSPITAL_COMMUNITY): Payer: Self-pay | Admitting: Emergency Medicine

## 2016-01-06 DIAGNOSIS — I10 Essential (primary) hypertension: Secondary | ICD-10-CM | POA: Insufficient documentation

## 2016-01-06 DIAGNOSIS — Z950 Presence of cardiac pacemaker: Secondary | ICD-10-CM | POA: Insufficient documentation

## 2016-01-06 DIAGNOSIS — M79652 Pain in left thigh: Secondary | ICD-10-CM | POA: Diagnosis present

## 2016-01-06 DIAGNOSIS — M5416 Radiculopathy, lumbar region: Secondary | ICD-10-CM | POA: Diagnosis not present

## 2016-01-06 DIAGNOSIS — Z8639 Personal history of other endocrine, nutritional and metabolic disease: Secondary | ICD-10-CM | POA: Insufficient documentation

## 2016-01-06 DIAGNOSIS — Z79899 Other long term (current) drug therapy: Secondary | ICD-10-CM | POA: Insufficient documentation

## 2016-01-06 DIAGNOSIS — F17210 Nicotine dependence, cigarettes, uncomplicated: Secondary | ICD-10-CM | POA: Insufficient documentation

## 2016-01-06 MED ORDER — DEXAMETHASONE SODIUM PHOSPHATE 10 MG/ML IJ SOLN
10.0000 mg | Freq: Once | INTRAMUSCULAR | Status: AC
  Administered 2016-01-06: 10 mg via INTRAMUSCULAR
  Filled 2016-01-06: qty 1

## 2016-01-06 MED ORDER — KETOROLAC TROMETHAMINE 30 MG/ML IJ SOLN
60.0000 mg | Freq: Once | INTRAMUSCULAR | Status: AC
  Administered 2016-01-06: 60 mg via INTRAMUSCULAR
  Filled 2016-01-06: qty 2

## 2016-01-06 MED ORDER — METHYLPREDNISOLONE 4 MG PO TBPK: ORAL_TABLET | ORAL | Status: DC

## 2016-01-06 MED ORDER — CYCLOBENZAPRINE HCL 10 MG PO TABS
10.0000 mg | ORAL_TABLET | Freq: Once | ORAL | Status: AC
  Administered 2016-01-06: 10 mg via ORAL
  Filled 2016-01-06: qty 1

## 2016-01-06 MED ORDER — CYCLOBENZAPRINE HCL 10 MG PO TABS: 10.0000 mg | ORAL_TABLET | Freq: Three times a day (TID) | ORAL | Status: DC | PRN

## 2016-01-06 NOTE — Discharge Instructions (Signed)
Use ice and heat to the painful area. Take the medrol dose pack as prescribed with the flexeril. You should have plenty of pain medications to take. Keep your appointment tomorrow with Dr Nevada Crane.    Lumbosacral Radiculopathy Lumbosacral radiculopathy is a condition that involves the spinal nerves and nerve roots in the low back and bottom of the spine. The condition develops when these nerves and nerve roots move out of place or become inflamed and cause symptoms. CAUSES This condition may be caused by:  Pressure from a disk that bulges out of place (herniated disk). A disk is a plate of cartilage that separates bones in the spine.  Disk degeneration.  A narrowing of the bones of the lower back (spinal stenosis).  A tumor.  An infection.  An injury that places sudden pressure on the disks that cushion the bones of your lower spine. RISK FACTORS This condition is more likely to develop in:  Males aged 30-50 years.  Females aged 40-60 years.  People who lift improperly.  People who are overweight or live a sedentary lifestyle.  People who smoke.  People who perform repetitive activities that strain the spine. SYMPTOMS Symptoms of this condition include:  Pain that goes down from the back into the legs (sciatica). This is the most common symptom. The pain may be worse with sitting, coughing, or sneezing.  Pain and numbness in the arms and legs.  Muscle weakness.  Tingling.  Loss of bladder control or bowel control. DIAGNOSIS This condition is diagnosed with a physical exam and medical history. If the pain is lasting, you may have tests, such as:  MRI scan.  X-ray.  CT scan.  Myelogram.  Nerve conduction study. TREATMENT This condition is often treated with:  Hot packs and ice applied to affected areas.  Stretches to improve flexibility.  Exercises to strengthen back muscles.  Physical therapy.  Pain medicine.  A steroid injection in the spine. In some  cases, no treatment is needed. If the condition is long-lasting (chronic), or if symptoms are severe, treatment may involve surgery or lifestyle changes, such as following a weight loss plan. HOME CARE INSTRUCTIONS Medicines  Take medicines only as directed by your health care provider.  Do not drive or operate heavy machinery while taking pain medicine. Injury Care  Apply a heat pack to the injured area as directed by your health care provider.  Apply ice to the affected area:  Put ice in a plastic bag.  Place a towel between your skin and the bag.  Leave the ice on for 20-30 minutes, every 2 hours while you are awake or as needed. Or, leave the ice on for as long as directed by your health care provider. Other Instructions  If you were shown how to do any exercises or stretches, do them as directed by your health care provider.  If your health care provider prescribed a diet or exercise program, follow it as directed.  Keep all follow-up visits as directed by your health care provider. This is important. SEEK MEDICAL CARE IF:  Your pain does not improve over time even when taking pain medicines. SEEK IMMEDIATE MEDICAL CARE IF:  Your develop severe pain.  Your pain suddenly gets worse.  You develop increasing weakness in your legs.  You lose the ability to control your bladder or bowel.  You have difficulty walking or balancing.  You have a fever.   This information is not intended to replace advice given to you by  your health care provider. Make sure you discuss any questions you have with your health care provider.   Document Released: 11/22/2005 Document Revised: 04/08/2015 Document Reviewed: 11/18/2014 Elsevier Interactive Patient Education [employer].

## 2016-01-06 NOTE — ED Notes (Signed)
Patient given discharge instruction, verbalized understand. Patient wheelchair out of the department.  

## 2016-01-06 NOTE — ED Notes (Signed)
Pt c/o left leg pain from hip to foot.

## 2016-01-06 NOTE — ED Provider Notes (Signed)
CSN: [id number]     Arrival date & time 01/06/16  0501 History   First MD Initiated Contact with Patient 01/06/16 [phone]   Chief Complaint  Patient presents with  . Leg Pain     (Consider location/radiation/quality/duration/timing/severity/associated sxs/prior Treatment) HPI patient reports he has been on hydrocodone for at least 10 years for chronic low back pain. He states that Saturday, January 28 he started having pain along his left lateral thigh that radiates down to his left mid lower leg. He denies any numbness or tingling in his toes. He denies any change in activity in and he states he's been doing less because he had a pacemaker inserted around Thanksgiving and he was told to not do any lifting. He states he's never had it radiate in his leg before. He denies having prior back surgery. He denies any urinary or rectal incontinence. He states it hurts to walk but he is not dragging his leg. He states he's never had to see an orthopedist or neurosurgeon about his back problems, it has always been managed by his primary care doctor. He states movement makes the pain worse, nothing makes it feel better.  PCP Dr Luan Pulling PCP Dr Merlyn Albert has first appt tomorrow  Past Medical History  Diagnosis Date  . Hypertension   . Hemochromatosis   . Mobitz type 2 second degree heart block     PPM MDT 10/28/15 Dr. Lovena Le   Past Surgical History  Procedure Laterality Date  . Ep implantable device N/A 10/28/2015    Procedure: Pacemaker Implant;  Surgeon: Evans Lance, MD;  Location: [facility];  Service: Cardiovascular;  Laterality: N/A;  . Ep implantable device N/A 11/07/2015    Procedure: Pocket Revision;  Surgeon: Evans Lance, MD;  Location: [facility];  Service: Cardiovascular;  Laterality: N/A;   Family History  Problem Relation Age of Onset  . Diabetes Other    Social History  Substance Use Topics  . Smoking status: Current Every Day Smoker -- 2.50 packs/day    Types:  Cigarettes  . Smokeless tobacco: None  . Alcohol Use: 12.6 oz/week    21 Shots of liquor per week     Comment: nightly  Drinks 3-4 "cups" of [name] nightly employed  Review of Systems  All other systems reviewed and are negative.     Allergies  Review of patient's allergies indicates no known allergies.  Home Medications   Prior to Admission medications   Medication Sig Start Date End Date Taking? Authorizing Provider  carvedilol (COREG) 6.25 MG tablet Take 1 tablet (25 mg total) by mouth 2 (two) times daily. 11/14/15  Yes Evans Lance, MD  hydrALAZINE (APRESOLINE) 50 MG tablet Take 1 tablet (100 mg total) by mouth 3 (three) times daily. 10/29/15  Yes Ripudeep Krystal Eaton, MD  HYDROcodone-acetaminophen (NORCO) 10-325 MG tablet Take 1 tablet by mouth every 6 (six) hours as needed for moderate pain.   Yes Historical Provider, MD  lisinopril (PRINIVIL,ZESTRIL) 40 MG tablet Take 40 mg by mouth daily.   Yes Historical Provider, MD  LORazepam (ATIVAN) 2 MG tablet Take 1 tablet by mouth every 6 (six) hours as needed for anxiety.  10/01/15  Yes Historical Provider, MD  potassium chloride SA (K-DUR,KLOR-CON) 20 MEQ tablet Take 1 tablet by mouth daily. 10/14/15  Yes Historical Provider, MD  nicotine (NICODERM CQ - DOSED IN MG/24 HOURS) 21 mg/24hr patch Place 1 patch (21 mg total) onto the skin daily. Patient  not taking: Reported on 11/06/2015 10/29/15   Ripudeep K Rai, MD   BP 157/74 mmHg  Pulse 60  Temp(Src) 97.7 F (36.5 C) (Oral)  Resp 18  SpO2 96%  Vital signs normal   Physical Exam  Constitutional: He is oriented to person, place, and time. He appears well-developed and well-nourished.  Non-toxic appearance. He does not appear ill. No distress.  HENT:  Head: Normocephalic and atraumatic.  Right Ear: External ear normal.  Left Ear: External ear normal.  Nose: Nose normal. No mucosal edema or rhinorrhea.  Mouth/Throat: Oropharynx is clear and moist and mucous membranes are  normal. No dental abscesses or uvula swelling.  Eyes: Conjunctivae and EOM are normal. Pupils are equal, round, and reactive to light.  Neck: Normal range of motion and full passive range of motion without pain. Neck supple.  Cardiovascular: Normal rate, regular rhythm and normal heart sounds.  Exam reveals no gallop and no friction rub.   No murmur heard. Pulmonary/Chest: Effort normal and breath sounds normal. No respiratory distress. He has no wheezes. He has no rhonchi. He has no rales. He exhibits no tenderness and no crepitus.  Pacemaker site looks well-healed without swelling or redness  Abdominal: Soft. Normal appearance and bowel sounds are normal. He exhibits no distension. There is no tenderness. There is no rebound and no guarding.  Musculoskeletal: Normal range of motion. He exhibits no edema or tenderness.  Moves all extremities well. Patient is nontender in his lumbar spine. He has some mild tenderness over the left SI joint. He does not have tenderness over the sciatic notch. He also does not have any tenderness of the true left hip joint. He indicates he has pain along the lateral aspect of his left thigh  Neurological: He is alert and oriented to person, place, and time. He has normal strength. No cranial nerve deficit.  Skin: Skin is warm, dry and intact. No rash noted. No erythema. No pallor.  Psychiatric: He has a normal mood and affect. His speech is normal and behavior is normal. His mood appears not anxious.  Nursing note and vitals reviewed.   ED Course  Procedures (including critical care time)  Medications  ketorolac (TORADOL) 30 MG/ML injection 60 mg (60 mg Intramuscular Given 01/06/16 0545)  dexamethasone (DECADRON) injection 10 mg (10 mg Intramuscular Given 01/06/16 0545)  cyclobenzaprine (FLEXERIL) tablet 10 mg (10 mg Oral Given 01/06/16 0544)    Patient was given Toradol IM, Decadron IM, and Flexeril for his radicular pain.  Review of the [facility] shows patient got #120 hydrocodone 10/325 filled on January 23, he had #120 lorazepam 2 mg tablets last filled on December 23. These are all prescribed by Dr. Luan Pulling.      EKG Interpretation  Date/Time:  Tuesday January 06 2016 05:20:04 EST Ventricular Rate:  60 PR Interval:  196 QRS Duration: 150 QT Interval:  451 QTC Calculation: 451 R Axis:   -130 Text Interpretation:  Atrial-ventricular dual-paced complexes No further analysis attempted due to paced rhythm No significant change since last tracing 29 Oct 2015 Confirmed by [facility]  MD-I, Cary Lothrop ([id number]) on 01/06/2016 5:37:10 AM        MDM   Final diagnoses:  Lumbar radiculopathy, acute    New Prescriptions   CYCLOBENZAPRINE (FLEXERIL) 10 MG TABLET    Take 1 tablet (10 mg total) by mouth 3 (three) times daily as needed (muscle pain).   METHYLPREDNISOLONE (MEDROL DOSEPAK) 4 MG TBPK TABLET    Take as  directed on box    Plan discharge  Rolland Porter, MD, Barbette Or, MD 01/06/16 [phone]

## 2016-01-06 NOTE — ED Notes (Signed)
Patient assisted with urinal, tolerated well

## 2016-01-20 ENCOUNTER — Emergency Department (HOSPITAL_COMMUNITY)
Admission: EM | Admit: 2016-01-20 | Discharge: 2016-01-20 | Disposition: A | Discharge status: Home Health | Payer: BLUE CROSS/BLUE SHIELD | Attending: Emergency Medicine | Admitting: Emergency Medicine

## 2016-01-20 ENCOUNTER — Encounter (HOSPITAL_COMMUNITY): Payer: Self-pay | Admitting: *Deleted

## 2016-01-20 DIAGNOSIS — M5416 Radiculopathy, lumbar region: Secondary | ICD-10-CM | POA: Diagnosis not present

## 2016-01-20 DIAGNOSIS — Z79899 Other long term (current) drug therapy: Secondary | ICD-10-CM | POA: Diagnosis not present

## 2016-01-20 DIAGNOSIS — F17210 Nicotine dependence, cigarettes, uncomplicated: Secondary | ICD-10-CM | POA: Diagnosis not present

## 2016-01-20 DIAGNOSIS — I10 Essential (primary) hypertension: Secondary | ICD-10-CM | POA: Diagnosis not present

## 2016-01-20 DIAGNOSIS — Z8639 Personal history of other endocrine, nutritional and metabolic disease: Secondary | ICD-10-CM | POA: Diagnosis not present

## 2016-01-20 DIAGNOSIS — M545 Low back pain: Secondary | ICD-10-CM | POA: Diagnosis present

## 2016-01-20 MED ORDER — PREDNISONE 10 MG (21) PO TBPK: 10.0000 mg | ORAL_TABLET | Freq: Every day | ORAL | Status: DC

## 2016-01-20 MED ORDER — OXYCODONE-ACETAMINOPHEN 5-325 MG PO TABS
2.0000 | ORAL_TABLET | Freq: Once | ORAL | Status: AC
  Administered 2016-01-20: 2 via ORAL
  Filled 2016-01-20: qty 2

## 2016-01-20 MED ORDER — METHYLPREDNISOLONE SODIUM SUCC 125 MG IJ SOLR
125.0000 mg | Freq: Once | INTRAMUSCULAR | Status: AC
  Administered 2016-01-20: 125 mg via INTRAMUSCULAR
  Filled 2016-01-20: qty 2

## 2016-01-20 MED ORDER — OXYCODONE-ACETAMINOPHEN 5-325 MG PO TABS: 1.0000 | ORAL_TABLET | Freq: Four times a day (QID) | ORAL | Status: DC | PRN

## 2016-01-20 NOTE — ED Provider Notes (Signed)
TIME SEEN: 4:20 AM  CHIEF COMPLAINT: Back pain  HPI: Pt is a 65 y.o. male with history of hypertension, second-degree heart block status post pacemaker who presents to the emergency department with complaints of lower back pain that radiates down the left posterior leg with associated tingling in the left posterior leg. No weakness. No history of injury to the back. No bowel or bladder incontinence. No urinary retention. No fever. Denies history of recent epidural injections or prior history of back surgery. No history of cancer. No weight loss or night sweats. Was seen in the emergency department over 2 weeks ago for the same. Was discharged with muscle relaxers and steroid taper. Reports that this improved his symptoms but symptoms have been worsening over the past several days. Was unable to sleep tonight secondary pain. Reports he chronically takes Vicodin. Has an appointment to see Dr. Nevada Crane at 2:00 today.  ROS: See HPI Constitutional: no fever  Eyes: no drainage  ENT: no runny nose   Cardiovascular:  no chest pain  Resp: no SOB  GI: no vomiting GU: no dysuria Integumentary: no rash  Allergy: no hives  Musculoskeletal: no leg swelling  Neurological: no slurred speech ROS otherwise negative  PAST MEDICAL HISTORY/PAST SURGICAL HISTORY:  Past Medical History  Diagnosis Date  . Hypertension   . Hemochromatosis   . Mobitz type 2 second degree heart block     PPM MDT 10/28/15 Dr. Lovena Le    MEDICATIONS:  Prior to Admission medications   Medication Sig Start Date End Date Taking? Authorizing Provider  carvedilol (COREG) 6.25 MG tablet Take 1 tablet (6.25 mg total) by mouth 2 (two) times daily. 11/14/15   Evans Lance, MD  cyclobenzaprine (FLEXERIL) 10 MG tablet Take 1 tablet (10 mg total) by mouth 3 (three) times daily as needed (muscle pain). 01/06/16   Rolland Porter, MD  hydrALAZINE (APRESOLINE) 50 MG tablet Take 1 tablet (50 mg total) by mouth 3 (three) times daily. 10/29/15   Ripudeep Krystal Eaton, MD  HYDROcodone-acetaminophen (NORCO) 10-325 MG tablet Take 1 tablet by mouth every 6 (six) hours as needed for moderate pain.    Historical Provider, MD  lisinopril (PRINIVIL,ZESTRIL) 40 MG tablet Take 40 mg by mouth daily.    Historical Provider, MD  LORazepam (ATIVAN) 2 MG tablet Take 1 tablet by mouth every 6 (six) hours as needed for anxiety.  10/01/15   Historical Provider, MD  methylPREDNISolone (MEDROL DOSEPAK) 4 MG TBPK tablet Take as directed on box 01/06/16   Rolland Porter, MD  nicotine (NICODERM CQ - DOSED IN MG/24 HOURS) 21 mg/24hr patch Place 1 patch (21 mg total) onto the skin daily. Patient not taking: Reported on 11/06/2015 10/29/15   Ripudeep Krystal Eaton, MD  potassium chloride SA (K-DUR,KLOR-CON) 20 MEQ tablet Take 1 tablet by mouth daily. 10/14/15   Historical Provider, MD    ALLERGIES:  No Known Allergies  SOCIAL HISTORY:  Social History  Substance Use Topics  . Smoking status: Current Every Day Smoker -- 2.50 packs/day    Types: Cigarettes  . Smokeless tobacco: Not on file  . Alcohol Use: 12.6 oz/week    21 Shots of liquor per week     Comment: nightly    FAMILY HISTORY: Family History  Problem Relation Age of Onset  . Diabetes Other     EXAM: BP 178/81 mmHg  Pulse 59  Temp(Src) 97.9 F (36.6 C) (Oral)  Resp 18  Ht '5\' 10"'$  (1.778 m)  Wt 189 lb (  85.73 kg)  BMI 27.12 kg/m2  SpO2 94% CONSTITUTIONAL: Alert and oriented and responds appropriately to questions. Well-appearing; well-nourished HEAD: Normocephalic EYES: Conjunctivae clear, PERRL ENT: normal nose; no rhinorrhea; moist mucous membranes; pharynx without lesions noted NECK: Supple, no meningismus, no LAD  CARD: RRR; S1 and S2 appreciated; no murmurs, no clicks, no rubs, no gallops RESP: Normal chest excursion without splinting or tachypnea; breath sounds clear and equal bilaterally; no wheezes, no rhonchi, no rales, no hypoxia or respiratory distress, speaking full sentences ABD/GI: Normal bowel sounds;  non-distended; soft, non-tender, no rebound, no guarding, no peritoneal signs BACK:  The back appears normal and is minimally tender over the left lumbar paraspinal musculature, no midline spinal tenderness or step-off or deformity, there is no CVA tenderness EXT: Normal ROM in all joints; non-tender to palpation; no edema; normal capillary refill; no cyanosis, no calf tenderness or swelling    SKIN: Normal color for age and race; warm NEURO: Moves all extremities equally, sensation to light touch intact diffusely, cranial nerves II through XII intact, no saddle anesthesia, 2+ deep tendon reflexes in bilateral lower extremities, no clonus, normal gait, strength 5/5 in all 4 extremities PSYCH: The patient's mood and manner are appropriate. Grooming and personal hygiene are appropriate.  MEDICAL DECISION MAKING: Patient here with symptoms consistent with lumbar radiculopathy. I am not concerned at this time for cauda equina, spinal stenosis, epidural abscess or hematoma, transverse myelitis, discitis, fracture. I recommend close outpatient follow-up with his PCP. He has an appointment today at 2 PM. I feel he will likely need an outpatient MRI. Do not feel he needs one emergently. We'll discharge patient home longer steroid taper and with Percocet. Have advised him not to take this medication along with his Vicodin. Have discussed with patient and his wife return precautions. They verbalize understanding and are comfortable with this plan.       [place], DO 01/20/16 [phone]

## 2016-01-20 NOTE — Discharge Instructions (Signed)
Lumbosacral Radiculopathy °Lumbosacral radiculopathy is a condition that involves the spinal nerves and nerve roots in the low back and bottom of the spine. The condition develops when these nerves and nerve roots move out of place or become inflamed and cause symptoms. °CAUSES °This condition may be caused by: °· Pressure from a disk that bulges out of place (herniated disk). A disk is a plate of cartilage that separates bones in the spine. °· Disk degeneration. °· A narrowing of the bones of the lower back (spinal stenosis). °· A tumor. °· An infection. °· An injury that places sudden pressure on the disks that cushion the bones of your lower spine. °RISK FACTORS °This condition is more likely to develop in: °· Males aged 30-50 years. °· Females aged 50-60 years. °· People who lift improperly. °· People who are overweight or live a sedentary lifestyle. °· People who smoke. °· People who perform repetitive activities that strain the spine. °SYMPTOMS °Symptoms of this condition include: °· Pain that goes down from the back into the legs (sciatica). This is the most common symptom. The pain may be worse with sitting, coughing, or sneezing. °· Pain and numbness in the arms and legs. °· Muscle weakness. °· Tingling. °· Loss of bladder control or bowel control. °DIAGNOSIS °This condition is diagnosed with a physical exam and medical history. If the pain is lasting, you may have tests, such as: °· MRI scan. °· X-ray. °· CT scan. °· Myelogram. °· Nerve conduction study. °TREATMENT °This condition is often treated with: °· Hot packs and ice applied to affected areas. °· Stretches to improve flexibility. °· Exercises to strengthen back muscles. °· Physical therapy. °· Pain medicine. °· A steroid injection in the spine. °In some cases, no treatment is needed. If the condition is long-lasting (chronic), or if symptoms are severe, treatment may involve surgery or lifestyle changes, such as following a weight loss plan. °HOME  CARE INSTRUCTIONS °Medicines °· Take medicines only as directed by your health care provider. °· Do not drive or operate heavy machinery while taking pain medicine. °Injury Care °· Apply a heat pack to the injured area as directed by your health care provider. °· Apply ice to the affected area: °¨ Put ice in a plastic bag. °¨ Place a towel between your skin and the bag. °¨ Leave the ice on for 20-30 minutes, every 2 hours while you are awake or as needed. Or, leave the ice on for as long as directed by your health care provider. °Other Instructions °· If you were shown how to do any exercises or stretches, do them as directed by your health care provider. °· If your health care provider prescribed a diet or exercise program, follow it as directed. °· Keep all follow-up visits as directed by your health care provider. This is important. °SEEK MEDICAL CARE IF: °· Your pain does not improve over time even when taking pain medicines. °SEEK IMMEDIATE MEDICAL CARE IF: °· Your develop severe pain. °· Your pain suddenly gets worse. °· You develop increasing weakness in your legs. °· You lose the ability to control your bladder or bowel. °· You have difficulty walking or balancing. °· You have a fever. °  °This information is not intended to replace advice given to you by your health care provider. Make sure you discuss any questions you have with your health care provider. °  °Document Released: 11/22/2005 Document Revised: 04/08/2015 Document Reviewed: 11/18/2014 °Elsevier Interactive Patient Education ©2016 [employer] ° °

## 2016-01-20 NOTE — ED Notes (Signed)
Pt c/o back pain that radiates down his left leg; pt states the pain got worse tonight

## 2016-02-13 ENCOUNTER — Encounter: Payer: Self-pay | Admitting: Internal Medicine

## 2016-02-13 ENCOUNTER — Ambulatory Visit (INDEPENDENT_AMBULATORY_CARE_PROVIDER_SITE_OTHER): Payer: BLUE CROSS/BLUE SHIELD | Admitting: Internal Medicine

## 2016-02-13 VITALS — BP 102/58 | HR 60 | Ht 71.0 in | Wt 180.0 lb

## 2016-02-13 DIAGNOSIS — I442 Atrioventricular block, complete: Secondary | ICD-10-CM

## 2016-02-13 NOTE — Progress Notes (Signed)
HPI Shaun May returns today for PPM followup. He is a 65 yo man with CHB, s/p PPM insertion, who then developed a pocket hematoma and underwent pocket revision. He has been bothered by back problems. No other complaints. No fever or chills. His BP has been well controlled.  No Known Allergies   Current Outpatient Prescriptions  Medication Sig Dispense Refill  . carvedilol (COREG) 25 MG tablet   11  . hydrALAZINE (APRESOLINE) 100 MG tablet   11  . HYDROcodone-acetaminophen (NORCO) 10-325 MG tablet Take 1 tablet by mouth every 6 (six) hours as needed for moderate pain.    Marland Kitchen lisinopril (PRINIVIL,ZESTRIL) 40 MG tablet Take 40 mg by mouth daily.    Marland Kitchen LORazepam (ATIVAN) 2 MG tablet Take 1 tablet by mouth every 6 (six) hours as needed for anxiety.     . methylPREDNISolone (MEDROL DOSEPAK) 4 MG TBPK tablet Take as directed on box 21 tablet 0  . oxyCODONE-acetaminophen (PERCOCET/ROXICET) 5-325 MG tablet Take 1-2 tablets by mouth every 6 (six) hours as needed. 15 tablet 0  . potassium chloride SA (K-DUR,KLOR-CON) 20 MEQ tablet Take 1 tablet by mouth daily.    . predniSONE (DELTASONE) 10 MG tablet   0   No current facility-administered medications for this visit.     Past Medical History  Diagnosis Date  . Hypertension   . Hemochromatosis   . Mobitz type 2 second degree heart block     PPM MDT 10/28/15 Dr. Lovena Le    ROS:   All systems reviewed and negative except as noted in the HPI.   Past Surgical History  Procedure Laterality Date  . Ep implantable device N/A 10/28/2015    Procedure: Pacemaker Implant;  Surgeon: Evans Lance, MD;  Location: [facility];  Service: Cardiovascular;  Laterality: N/A;  . Ep implantable device N/A 11/07/2015    Procedure: Pocket Revision;  Surgeon: Evans Lance, MD;  Location: [facility];  Service: Cardiovascular;  Laterality: N/A;     Family History  Problem Relation Age of Onset  . Diabetes Other      Social History    Social History  . Marital Status: Divorced    Spouse Name: N/A  . Number of Children: N/A  . Years of Education: N/A   Occupational History  . Not on file.   Social History Main Topics  . Smoking status: Current Every Day Smoker -- 2.50 packs/day    Types: Cigarettes  . Smokeless tobacco: Not on file  . Alcohol Use: 12.6 oz/week    21 Shots of liquor per week     Comment: nightly  . Drug Use: No  . Sexual Activity: Not on file   Other Topics Concern  . Not on file   Social History Narrative     BP 102/58 mmHg  Pulse 60  Ht '5\' 11"'$  (1.803 m)  Wt 180 lb (81.647 kg)  BMI 25.12 kg/m2  SpO2 95%  Physical Exam:  Well appearing 65 yo man, NAD HEENT: Unremarkable Neck:  No JVD, no thyromegally Lymphatics:  No adenopathy Back:  No CVA tenderness Lungs:  Clear with no wheezes, well healed PPM incision HEART:  Regular rate rhythm, no murmurs, no rubs, no clicks Abd:  soft, positive bowel sounds, no organomegally, no rebound, no guarding Ext:  2 plus pulses, no edema, no cyanosis, no clubbing Skin:  No rashes no nodules Neuro:  CN II through XII intact, motor grossly intact  DEVICE  Normal device function.  See PaceArt for details.   Assess/Plan: 1. CHB - he is s/p PPM doing well. He did have some conduction today and AV search hysteresis was turned on. 2. HTN - His blood pressure is well controlled. Will follow. 3. PPM - his Medtronic DDD PM is working normally. Will recheck in several months  Shaun May.D.

## 2016-02-13 NOTE — Patient Instructions (Addendum)
Your physician wants you to follow-up in: 9 Months with Dr. Lovena Le. You will receive a reminder letter in the mail two months in advance. If you don't receive a letter, please call our office to schedule the follow-up appointment.  Remote monitoring is used to monitor your Pacemaker of ICD from home. This monitoring reduces the number of office visits required to check your device to one time per year. It allows [place] to keep an eye on the functioning of your device to ensure it is working properly. You are scheduled for a device check from home on 05/17/16. You may send your transmission at any time that day. If you have a wireless device, the transmission will be sent automatically. After your physician reviews your transmission, you will receive a postcard with your next transmission date.  Your physician recommends that you continue on your current medications as directed. Please refer to the Current Medication list given to you today.  If you need a refill on your cardiac medications before your next appointment, please call your pharmacy.  Thank you for choosing [facility]!

## 2016-03-01 LAB — CUP PACEART INCLINIC DEVICE CHECK
Date Time Interrogation Session: 20170327142218
Implantable Lead Implant Date: 20161122
Implantable Lead Location: 753859
Implantable Lead Location: 753860
MDC IDC LEAD IMPLANT DT: 20161122

## 2016-03-27 ENCOUNTER — Emergency Department (HOSPITAL_COMMUNITY): Payer: BLUE CROSS/BLUE SHIELD

## 2016-03-27 ENCOUNTER — Inpatient Hospital Stay (HOSPITAL_COMMUNITY)
Admission: EM | Admit: 2016-03-27 | Discharge: 2016-03-28 | DRG: 192 | Disposition: A | Discharge status: Home / Self Care | Payer: BLUE CROSS/BLUE SHIELD | Attending: Internal Medicine | Admitting: Internal Medicine

## 2016-03-27 ENCOUNTER — Other Ambulatory Visit: Payer: Self-pay

## 2016-03-27 ENCOUNTER — Observation Stay (HOSPITAL_COMMUNITY): Payer: BLUE CROSS/BLUE SHIELD

## 2016-03-27 ENCOUNTER — Other Ambulatory Visit (HOSPITAL_COMMUNITY): Payer: Self-pay

## 2016-03-27 ENCOUNTER — Encounter (HOSPITAL_COMMUNITY): Payer: Self-pay | Admitting: *Deleted

## 2016-03-27 DIAGNOSIS — R0602 Shortness of breath: Secondary | ICD-10-CM

## 2016-03-27 DIAGNOSIS — Z833 Family history of diabetes mellitus: Secondary | ICD-10-CM

## 2016-03-27 DIAGNOSIS — R071 Chest pain on breathing: Secondary | ICD-10-CM | POA: Diagnosis not present

## 2016-03-27 DIAGNOSIS — I10 Essential (primary) hypertension: Secondary | ICD-10-CM | POA: Diagnosis present

## 2016-03-27 DIAGNOSIS — R079 Chest pain, unspecified: Secondary | ICD-10-CM | POA: Diagnosis present

## 2016-03-27 DIAGNOSIS — Z950 Presence of cardiac pacemaker: Secondary | ICD-10-CM

## 2016-03-27 DIAGNOSIS — F1020 Alcohol dependence, uncomplicated: Secondary | ICD-10-CM | POA: Diagnosis present

## 2016-03-27 DIAGNOSIS — J441 Chronic obstructive pulmonary disease with (acute) exacerbation: Principal | ICD-10-CM | POA: Diagnosis present

## 2016-03-27 DIAGNOSIS — A419 Sepsis, unspecified organism: Secondary | ICD-10-CM

## 2016-03-27 DIAGNOSIS — F17210 Nicotine dependence, cigarettes, uncomplicated: Secondary | ICD-10-CM | POA: Diagnosis present

## 2016-03-27 LAB — I-STAT TROPONIN, ED
TROPONIN I, POC: 0.01 ng/mL (ref 0.00–0.08)
TROPONIN I, POC: 0.01 ng/mL (ref 0.00–0.08)

## 2016-03-27 LAB — INFLUENZA PANEL BY PCR (TYPE A & B)
H1N1 flu by pcr: NOT DETECTED
INFLAPCR: NEGATIVE
INFLBPCR: NEGATIVE

## 2016-03-27 LAB — BASIC METABOLIC PANEL
ANION GAP: 10 (ref 5–15)
BUN: 25 mg/dL — AB (ref 6–20)
CALCIUM: 9.1 mg/dL (ref 8.9–10.3)
CO2: 25 mmol/L (ref 22–32)
CREATININE: 0.78 mg/dL (ref 0.61–1.24)
Chloride: 96 mmol/L — ABNORMAL LOW (ref 101–111)
GFR calc Af Amer: >60 mL/min (ref >60)
GLUCOSE: 153 mg/dL — AB (ref 65–99)
Potassium: 3.6 mmol/L (ref 3.5–5.1)
Sodium: 131 mmol/L — ABNORMAL LOW (ref 135–145)

## 2016-03-27 LAB — D-DIMER, QUANTITATIVE (NOT AT ARMC)

## 2016-03-27 LAB — LACTIC ACID, PLASMA: Lactic Acid, Venous: 1 mmol/L (ref 0.5–2.0)

## 2016-03-27 LAB — TROPONIN I: TROPONIN I: 0.03 ng/mL (ref <0.031)

## 2016-03-27 LAB — URINALYSIS, ROUTINE W REFLEX MICROSCOPIC
BILIRUBIN URINE: NEGATIVE
Glucose, UA: 100 mg/dL — AB
HGB URINE DIPSTICK: NEGATIVE
Ketones, ur: NEGATIVE mg/dL
Leukocytes, UA: NEGATIVE
Nitrite: NEGATIVE
PROTEIN: 30 mg/dL — AB
Specific Gravity, Urine: 1.025 (ref 1.005–1.030)
pH: 6 (ref 5.0–8.0)

## 2016-03-27 LAB — HEPATIC FUNCTION PANEL
ALBUMIN: 3.9 g/dL (ref 3.5–5.0)
ALT: 38 U/L (ref 17–63)
AST: 46 U/L — ABNORMAL HIGH (ref 15–41)
Alkaline Phosphatase: 51 U/L (ref 38–126)
BILIRUBIN INDIRECT: 1 mg/dL — AB (ref 0.3–0.9)
Bilirubin, Direct: 0.3 mg/dL (ref 0.1–0.5)
TOTAL PROTEIN: 7 g/dL (ref 6.5–8.1)
Total Bilirubin: 1.3 mg/dL — ABNORMAL HIGH (ref 0.3–1.2)

## 2016-03-27 LAB — CBC
HCT: 40.9 % (ref 39.0–52.0)
Hemoglobin: 14 g/dL (ref 13.0–17.0)
MCH: 32.2 pg (ref 26.0–34.0)
MCHC: 34.2 g/dL (ref 30.0–36.0)
MCV: 94 fL (ref 78.0–100.0)
PLATELETS: 270 10*3/uL (ref 150–400)
RBC: 4.35 MIL/uL (ref 4.22–5.81)
RDW: 14.4 % (ref 11.5–15.5)
WBC: 17.1 10*3/uL — ABNORMAL HIGH (ref 4.0–10.5)

## 2016-03-27 LAB — URINE MICROSCOPIC-ADD ON

## 2016-03-27 LAB — GLUCOSE, CAPILLARY: GLUCOSE-CAPILLARY: 146 mg/dL — AB (ref 65–99)

## 2016-03-27 LAB — BRAIN NATRIURETIC PEPTIDE: B Natriuretic Peptide: 138 pg/mL — ABNORMAL HIGH (ref 0.0–100.0)

## 2016-03-27 LAB — PROCALCITONIN: Procalcitonin: 0.49 ng/mL

## 2016-03-27 LAB — I-STAT CG4 LACTIC ACID, ED: Lactic Acid, Venous: 1.27 mmol/L (ref 0.5–2.0)

## 2016-03-27 MED ORDER — SODIUM CHLORIDE 0.9% FLUSH
3.0000 mL | Freq: Two times a day (BID) | INTRAVENOUS | Status: DC
  Administered 2016-03-27 – 2016-03-28 (×3): 3 mL via INTRAVENOUS

## 2016-03-27 MED ORDER — METHYLPREDNISOLONE SODIUM SUCC 125 MG IJ SOLR
80.0000 mg | Freq: Two times a day (BID) | INTRAMUSCULAR | Status: DC
Start: 2016-03-27 — End: 2016-03-28
  Administered 2016-03-27 – 2016-03-28 (×2): 80 mg via INTRAVENOUS
  Filled 2016-03-27 (×2): qty 2

## 2016-03-27 MED ORDER — ROPINIROLE HCL 0.25 MG PO TABS
0.2500 mg | ORAL_TABLET | Freq: Every evening | ORAL | Status: DC | PRN
  Filled 2016-03-27: qty 1

## 2016-03-27 MED ORDER — IPRATROPIUM-ALBUTEROL 0.5-2.5 (3) MG/3ML IN SOLN
3.0000 mL | Freq: Four times a day (QID) | RESPIRATORY_TRACT | Status: DC
  Administered 2016-03-27 (×3): 3 mL via RESPIRATORY_TRACT
  Filled 2016-03-27 (×3): qty 3

## 2016-03-27 MED ORDER — DEXTROSE 5 % IV SOLN
1.0000 g | INTRAVENOUS | Status: DC
  Administered 2016-03-28: 1 g via INTRAVENOUS
  Filled 2016-03-27 (×2): qty 10

## 2016-03-27 MED ORDER — ALBUTEROL SULFATE (2.5 MG/3ML) 0.083% IN NEBU: 2.5000 mg | INHALATION_SOLUTION | RESPIRATORY_TRACT | Status: DC | PRN

## 2016-03-27 MED ORDER — DEXTROSE 5 % IV SOLN
1.0000 g | Freq: Once | INTRAVENOUS | Status: AC
  Administered 2016-03-27: 1 g via INTRAVENOUS
  Filled 2016-03-27: qty 10

## 2016-03-27 MED ORDER — SODIUM CHLORIDE 0.9 % IV BOLUS (SEPSIS)
500.0000 mL | INTRAVENOUS | Status: AC
  Administered 2016-03-27: 500 mL via INTRAVENOUS

## 2016-03-27 MED ORDER — IPRATROPIUM-ALBUTEROL 0.5-2.5 (3) MG/3ML IN SOLN
3.0000 mL | Freq: Once | RESPIRATORY_TRACT | Status: AC
  Administered 2016-03-27: 3 mL via RESPIRATORY_TRACT
  Filled 2016-03-27: qty 3

## 2016-03-27 MED ORDER — SODIUM CHLORIDE 0.9 % IV BOLUS (SEPSIS)
1000.0000 mL | INTRAVENOUS | Status: AC
  Administered 2016-03-27: 500 mL via INTRAVENOUS
  Administered 2016-03-27: 1000 mL via INTRAVENOUS

## 2016-03-27 MED ORDER — OXYCODONE-ACETAMINOPHEN 5-325 MG PO TABS
1.0000 | ORAL_TABLET | Freq: Four times a day (QID) | ORAL | Status: DC | PRN
  Administered 2016-03-27 – 2016-03-28 (×4): 1 via ORAL
  Filled 2016-03-27 (×5): qty 1

## 2016-03-27 MED ORDER — SODIUM CHLORIDE 0.9% FLUSH: 3.0000 mL | INTRAVENOUS | Status: DC | PRN

## 2016-03-27 MED ORDER — SODIUM CHLORIDE 0.9 % IV SOLN
INTRAVENOUS | Status: DC
  Administered 2016-03-27 – 2016-03-28 (×2): via INTRAVENOUS

## 2016-03-27 MED ORDER — ONDANSETRON HCL 4 MG PO TABS: 4.0000 mg | ORAL_TABLET | Freq: Four times a day (QID) | ORAL | Status: DC | PRN

## 2016-03-27 MED ORDER — ONDANSETRON HCL 4 MG/2ML IJ SOLN: 4.0000 mg | Freq: Four times a day (QID) | INTRAMUSCULAR | Status: DC | PRN

## 2016-03-27 MED ORDER — ALBUTEROL SULFATE (2.5 MG/3ML) 0.083% IN NEBU: 2.5000 mg | INHALATION_SOLUTION | Freq: Four times a day (QID) | RESPIRATORY_TRACT | Status: DC

## 2016-03-27 MED ORDER — LORAZEPAM 1 MG PO TABS: 2.0000 mg | ORAL_TABLET | Freq: Four times a day (QID) | ORAL | Status: DC | PRN

## 2016-03-27 MED ORDER — IPRATROPIUM BROMIDE 0.02 % IN SOLN: 0.5000 mg | Freq: Four times a day (QID) | RESPIRATORY_TRACT | Status: DC

## 2016-03-27 MED ORDER — PREDNISONE 50 MG PO TABS
60.0000 mg | ORAL_TABLET | Freq: Once | ORAL | Status: AC
  Administered 2016-03-27: 60 mg via ORAL
  Filled 2016-03-27: qty 1

## 2016-03-27 MED ORDER — GUAIFENESIN ER 600 MG PO TB12
600.0000 mg | ORAL_TABLET | Freq: Two times a day (BID) | ORAL | Status: DC
  Administered 2016-03-27 – 2016-03-28 (×3): 600 mg via ORAL
  Filled 2016-03-27 (×3): qty 1

## 2016-03-27 MED ORDER — HYDRALAZINE HCL 25 MG PO TABS
50.0000 mg | ORAL_TABLET | Freq: Three times a day (TID) | ORAL | Status: DC
  Administered 2016-03-27 – 2016-03-28 (×2): 50 mg via ORAL
  Filled 2016-03-27 (×2): qty 2

## 2016-03-27 MED ORDER — DEXTROSE 5 % IV SOLN
500.0000 mg | Freq: Once | INTRAVENOUS | Status: AC
  Administered 2016-03-27: 500 mg via INTRAVENOUS
  Filled 2016-03-27: qty 500

## 2016-03-27 MED ORDER — SODIUM CHLORIDE 0.9 % IV SOLN: 250.0000 mL | INTRAVENOUS | Status: DC | PRN

## 2016-03-27 MED ORDER — ROPINIROLE HCL 1 MG PO TABS
0.5000 mg | ORAL_TABLET | ORAL | Status: AC
  Administered 2016-03-27: 0.5 mg via ORAL

## 2016-03-27 MED ORDER — HYDRALAZINE HCL 100 MG PO TABS: 50.0000 mg | ORAL_TABLET | Freq: Three times a day (TID) | ORAL | Status: DC

## 2016-03-27 MED ORDER — DEXTROSE 5 % IV SOLN
250.0000 mg | INTRAVENOUS | Status: DC
  Administered 2016-03-28: 250 mg via INTRAVENOUS
  Filled 2016-03-27 (×2): qty 250

## 2016-03-27 MED ORDER — IPRATROPIUM-ALBUTEROL 0.5-2.5 (3) MG/3ML IN SOLN: 3.0000 mL | Freq: Once | RESPIRATORY_TRACT | Status: DC

## 2016-03-27 MED ORDER — LISINOPRIL 10 MG PO TABS
40.0000 mg | ORAL_TABLET | Freq: Every day | ORAL | Status: DC
  Administered 2016-03-27 – 2016-03-28 (×2): 40 mg via ORAL
  Filled 2016-03-27 (×2): qty 4

## 2016-03-27 MED ORDER — IPRATROPIUM-ALBUTEROL 0.5-2.5 (3) MG/3ML IN SOLN
RESPIRATORY_TRACT | Status: AC
  Administered 2016-03-27: 3 mL
  Filled 2016-03-27: qty 3

## 2016-03-27 MED ORDER — NICOTINE 21 MG/24HR TD PT24
21.0000 mg | MEDICATED_PATCH | Freq: Every day | TRANSDERMAL | Status: DC
  Administered 2016-03-27 – 2016-03-28 (×2): 21 mg via TRANSDERMAL
  Filled 2016-03-27 (×2): qty 1

## 2016-03-27 NOTE — ED Notes (Signed)
Pt arrived by EMS from home. Pt reports CP & SOB that started about 6 hours ago.

## 2016-03-27 NOTE — ED Provider Notes (Signed)
CSN: [id number]     Arrival date & time 03/27/16  0128 History   First MD Initiated Contact with Patient 03/27/16 0151     Chief Complaint  Patient presents with  . Chest Pain     (Consider location/radiation/quality/duration/timing/severity/associated sxs/prior Treatment) HPI  This is a 55 are old male with a history of hypertension, pacemaker secondary to heart block who presents with shortness of breath and chest pain. Patient reports 2 to three-day history of worsening shortness of breath. Dyspnea on exertion. Also reports a nonproductive cough. He is in every day smoker. States that tonight however, he developed worsening chest pain. It started at 7 PM. It is sharp and nonradiating. Current pain is 8 out of 10. It does worsen with deep breaths. Patient denies history of coronary artery disease or heart failure. Does report some left lower extremity swelling which she attributes to "turning my ankle" 4 weeks ago. He denies fevers.  Past Medical History  Diagnosis Date  . Hypertension   . Hemochromatosis   . Mobitz type 2 second degree heart block     PPM MDT 10/28/15 Dr. Lovena Le   Past Surgical History  Procedure Laterality Date  . Ep implantable device N/A 10/28/2015    Procedure: Pacemaker Implant;  Surgeon: Evans Lance, MD;  Location: [place];  Service: Cardiovascular;  Laterality: N/A;  . Ep implantable device N/A 11/07/2015    Procedure: Pocket Revision;  Surgeon: Evans Lance, MD;  Location: [place] [facility];  Service: Cardiovascular;  Laterality: N/A;   Family History  Problem Relation Age of Onset  . Diabetes Other    Social History  Substance Use Topics  . Smoking status: Current Every Day Smoker -- 2.50 packs/day    Types: Cigarettes  . Smokeless tobacco: None  . Alcohol Use: 12.6 oz/week    21 Shots of liquor per week     Comment: nightly    Review of Systems  Constitutional: Positive for fever. Negative for chills.  Respiratory: Positive for  cough and shortness of breath.   Cardiovascular: Positive for chest pain and leg swelling.  Gastrointestinal: Negative for nausea, vomiting and abdominal pain.  Genitourinary: Negative for dysuria.  All other systems reviewed and are negative.     Allergies  Review of patient's allergies indicates no known allergies.  Home Medications   Prior to Admission medications   Medication Sig Start Date End Date Taking? Authorizing Provider  carvedilol (COREG) 25 MG tablet  01/23/16  Yes Historical Provider, MD  hydrALAZINE (APRESOLINE) 100 MG tablet  01/23/16  Yes Historical Provider, MD  lisinopril (PRINIVIL,ZESTRIL) 40 MG tablet Take 40 mg by mouth daily.   Yes Historical Provider, MD  LORazepam (ATIVAN) 2 MG tablet Take 1 tablet by mouth every 6 (six) hours as needed for anxiety.  10/01/15  Yes Historical Provider, MD  oxyCODONE-acetaminophen (PERCOCET/ROXICET) 5-325 MG tablet Take 1-2 tablets by mouth every 6 (six) hours as needed. 01/20/16  Yes Kristen N Ward, DO  potassium chloride SA (K-DUR,KLOR-CON) 20 MEQ tablet Take 1 tablet by mouth daily. 10/14/15  Yes Historical Provider, MD  HYDROcodone-acetaminophen (NORCO) 10-325 MG tablet Take 1 tablet by mouth every 6 (six) hours as needed for moderate pain.    Historical Provider, MD  methylPREDNISolone (MEDROL DOSEPAK) 4 MG TBPK tablet Take as directed on box 01/06/16   Rolland Porter, MD  predniSONE (DELTASONE) 10 MG tablet  01/20/16   Historical Provider, MD   BP 97/77 mmHg  Pulse 72  Temp(Src) 100.9 F (38.3 C) (Rectal)  Resp 19  Ht '5\' 10"'$  (1.778 m)  Wt 184 lb (83.462 kg)  BMI 26.40 kg/m2  SpO2 95% Physical Exam  Constitutional: He is oriented to person, place, and time. No distress.  Elderly, no acute distress  HENT:  Head: Normocephalic and atraumatic.  Mucous membranes dry  Eyes: Pupils are equal, round, and reactive to light.  Cardiovascular: Normal rate, regular rhythm and normal heart sounds.   No murmur heard. Pulmonary/Chest:  Effort normal. No respiratory distress. He has no wheezes. He has rales.  Fine crackles bilateral bases, coarse  Abdominal: Soft. Bowel sounds are normal. There is no tenderness. There is no rebound.  Musculoskeletal: He exhibits no edema.  Trace left greater than right lower extremity edema  Neurological: He is alert and oriented to person, place, and time.  Skin: Skin is warm and dry.  Psychiatric: He has a normal mood and affect.  Nursing note and vitals reviewed.   ED Course  Procedures (including critical care time) Labs Review Labs Reviewed  BASIC METABOLIC PANEL - Abnormal; Notable for the following:    Sodium 131 (*)    Chloride 96 (*)    Glucose, Bld 153 (*)    BUN 25 (*)    All other components within normal limits  CBC - Abnormal; Notable for the following:    WBC 17.1 (*)    All other components within normal limits  BRAIN NATRIURETIC PEPTIDE - Abnormal; Notable for the following:    B Natriuretic Peptide 138.0 (*)    All other components within normal limits  HEPATIC FUNCTION PANEL - Abnormal; Notable for the following:    AST 46 (*)    Total Bilirubin 1.3 (*)    Indirect Bilirubin 1.0 (*)    All other components within normal limits  CULTURE, BLOOD (ROUTINE X 2)  CULTURE, BLOOD (ROUTINE X 2)  URINE CULTURE  LACTIC ACID, PLASMA  D-DIMER, QUANTITATIVE (NOT AT [facility])  URINALYSIS, ROUTINE W REFLEX MICROSCOPIC (NOT AT [facility])  INFLUENZA PANEL BY PCR (TYPE A & B, H1N1)  PROCALCITONIN  I-STAT TROPOININ, ED  I-STAT CG4 LACTIC ACID, ED  I-STAT TROPOININ, ED  I-STAT CG4 LACTIC ACID, ED    Imaging Review Dg Chest 2 View  03/27/2016  CLINICAL DATA:  Chest pain and shortness of breath for 6 hours. EXAM: CHEST  2 VIEW COMPARISON:  10/29/2015 FINDINGS: Dual lead left-sided pacemaker in place. The ventricular lead is no slightly tortuous. There is cardiomegaly that is new from prior exam. No pulmonary edema. No confluent airspace disease. No pleural effusion or  pneumothorax. There is degenerative change in the spine. IMPRESSION: 1. Cardiomegaly, new from exam 5 months prior. The ventricular lead of the dual lead pacemaker is tortuous. 2. No pulmonary edema or localizing process. Electronically Signed   By: Jeb Levering M.D.   On: 03/27/2016 02:44   I have personally reviewed and evaluated these images and lab results as part of my medical decision-making.   EKG Interpretation   Date/Time:  Saturday March 27 2016 01:32:04 EDT Ventricular Rate:  81 PR Interval:  203 QRS Duration: 132 QT Interval:  392 QTC Calculation: 455 R Axis:   -79 Text Interpretation:  Atrial-sensed ventricular-paced complexes No further  analysis attempted due to paced rhythm No significant change since last  tracing Confirmed by Ahijah Devery  MD, Taniah Reinecke ([id number]) on 03/27/2016 1:50:59 AM     Medications  predniSONE (DELTASONE) tablet 60 mg (not administered)  sodium chloride  0.9 % bolus 1,000 mL (0 mLs Intravenous Return to [facility] 03/27/16 0519)    Followed by  sodium chloride 0.9 % bolus 500 mL (0 mLs Intravenous Stopped 03/27/16 0425)  cefTRIAXone (ROCEPHIN) 1 g in dextrose 5 % 50 mL IVPB (0 g Intravenous Stopped 03/27/16 0405)  azithromycin (ZITHROMAX) 500 mg in dextrose 5 % 250 mL IVPB (0 mg Intravenous Stopped 03/27/16 0514)  ipratropium-albuterol (DUONEB) 0.5-2.5 (3) MG/3ML nebulizer solution (3 mLs  Given 03/27/16 0347)  ipratropium-albuterol (DUONEB) 0.5-2.5 (3) MG/3ML nebulizer solution 3 mL (3 mLs Nebulization Given 03/27/16 0526)    MDM   Final diagnoses:  SOB (shortness of breath)  Sepsis, due to unspecified organism [facility])    Patient presents with shortness of breath and chest pain. Ongoing for the last several days. Atypical for ACS. EKG is paced. Troponin negative. Chest x-ray shows cardiomegaly without effusion. No obvious pneumonia but this could lag behind. He was noted to be febrile to 100.9. Sepsis workup initiated. Blood pressures are marginal. For this  reason he was given 30 mL/kg of fluid. Empirically treated for possible pneumonia. Lab work notable for leukocytosis to 17. Given history of smoking and reported history of COPD, patient was also given a DuoNeb and prednisone given his shortness of breath. D-dimer is negative. BNP 138.  Given borderline blood pressures and persistent shortness of breath, will admit for further management.  Discussed with Dr. Shanon Brow.    Merryl Hacker, MD 03/27/16 [phone]

## 2016-03-27 NOTE — H&P (Signed)
PCP:   Wende Neighbors, MD   Chief Complaint:  Chest pain  HPI: 65 yo male h/o copd, tobacco abuse, htn comes in with several days of worsening sob and cough.  Today he got really sob, and was having chest pain all over when he breathed.  He tried to use his albuterol inhaler but it did not help.  EMS was called, and told him he was wheezing.  He denies any fevers or chills at home.  Cough has been dry.  He denies any uri symptoms or general malaise.  No flu like symptoms.  No swelling in his legs, or pain in his legs.  He is feeling better since arrival to the ED with steroids, abx, nebs, ivf.  Pt denies any urinary symptoms or rashes.  Pt bp was borderline soft on arrival, given sepsis protocol bolus and referred for admission for possible early pna with possible early sepsis.    Review of Systems:  Positive and negative as per HPI otherwise all other systems are negative  Past Medical History: Past Medical History  Diagnosis Date  . Hypertension   . Hemochromatosis   . Mobitz type 2 second degree heart block     PPM MDT 10/28/15 Dr. Lovena Le   Past Surgical History  Procedure Laterality Date  . Ep implantable device N/A 10/28/2015    Procedure: Pacemaker Implant;  Surgeon: Evans Lance, MD;  Location: [facility];  Service: Cardiovascular;  Laterality: N/A;  . Ep implantable device N/A 11/07/2015    Procedure: Pocket Revision;  Surgeon: Evans Lance, MD;  Location: [place] [facility];  Service: Cardiovascular;  Laterality: N/A;    Medications: Prior to Admission medications   Medication Sig Start Date End Date Taking? Authorizing Provider  carvedilol (COREG) 25 MG tablet  01/23/16  Yes Historical Provider, MD  hydrALAZINE (APRESOLINE) 100 MG tablet  01/23/16  Yes Historical Provider, MD  lisinopril (PRINIVIL,ZESTRIL) 40 MG tablet Take 40 mg by mouth daily.   Yes Historical Provider, MD  LORazepam (ATIVAN) 2 MG tablet Take 1 tablet by mouth every 6 (six) hours as needed for  anxiety.  10/01/15  Yes Historical Provider, MD  oxyCODONE-acetaminophen (PERCOCET/ROXICET) 5-325 MG tablet Take 1-2 tablets by mouth every 6 (six) hours as needed. 01/20/16  Yes Kristen N Ward, DO  potassium chloride SA (K-DUR,KLOR-CON) 20 MEQ tablet Take 1 tablet by mouth daily. 10/14/15  Yes Historical Provider, MD  HYDROcodone-acetaminophen (NORCO) 10-325 MG tablet Take 1 tablet by mouth every 6 (six) hours as needed for moderate pain.    Historical Provider, MD  methylPREDNISolone (MEDROL DOSEPAK) 4 MG TBPK tablet Take as directed on box 01/06/16   Rolland Porter, MD  predniSONE (DELTASONE) 10 MG tablet  01/20/16   Historical Provider, MD    Allergies:  No Known Allergies  Social History:  reports that he has been smoking Cigarettes.  He has been smoking about 2.50 packs per day. He does not have any smokeless tobacco history on file. He reports that he drinks about 12.6 oz of alcohol per week. He reports that he does not use illicit drugs.  Family History: Family History  Problem Relation Age of Onset  . Diabetes Other     Physical Exam: Filed Vitals:   03/27/16 0425 03/27/16 0430 03/27/16 0500 03/27/16 0527  BP: 109/83 110/77 97/77   Pulse: 70 66 72   Temp:      TempSrc:      Resp: 21 19 19  Height:      Weight:      SpO2: 99% 99% 94% 95%   General appearance: alert, cooperative and no distress  Nontoxic, no resp distress Head: Normocephalic, without obvious abnormality, atraumatic Eyes: negative Nose: Nares normal. Septum midline. Mucosa normal. No drainage or sinus tenderness. Neck: no JVD and supple, symmetrical, trachea midline Lungs: diminished breath sounds bilaterally and wheezes bibasilar with fair air movement Heart: regular rate and rhythm, S1, S2 normal, no murmur, click, rub or gallop Abdomen: soft, non-tender; bowel sounds normal; no masses,  no organomegaly Extremities: extremities normal, atraumatic, no cyanosis or edema Pulses: 2+ and symmetric Skin: Skin  color, texture, turgor normal. No rashes or lesions Neurologic: Grossly normal    Labs on Admission:   Recent Labs  03/27/16 0145  NA 131*  K 3.6  CL 96*  CO2 25  GLUCOSE 153*  BUN 25*  CREATININE 0.78  CALCIUM 9.1    Recent Labs  03/27/16 0257  AST 46*  ALT 38  ALKPHOS 51  BILITOT 1.3*  PROT 7.0  ALBUMIN 3.9    Recent Labs  03/27/16 0145  WBC 17.1*  HGB 14.0  HCT 40.9  MCV 94.0  PLT 270   Radiological Exams on Admission: Dg Chest 2 View  03/27/2016  CLINICAL DATA:  Chest pain and shortness of breath for 6 hours. EXAM: CHEST  2 VIEW COMPARISON:  10/29/2015 FINDINGS: Dual lead left-sided pacemaker in place. The ventricular lead is no slightly tortuous. There is cardiomegaly that is new from prior exam. No pulmonary edema. No confluent airspace disease. No pleural effusion or pneumothorax. There is degenerative change in the spine. IMPRESSION: 1. Cardiomegaly, new from exam 5 months prior. The ventricular lead of the dual lead pacemaker is tortuous. 2. No pulmonary edema or localizing process. Electronically Signed   By: Jeb Levering M.D.   On: 03/27/2016 02:44   cxr reviwed no edema or infiltrate Case discussed with dr Dina Rich  Assessment/Plan  65 yo male with acute copde  Principal Problem:   COPD exacerbation ([place])- with likely early pna.  Place on iv solumedrol.  Give rocephin and azithro.  cxr is neg.  Pt afebrile with  Normal lactic acid level.  Doubt septic, will add on procalcitonin level.  freq nebs and mucinex also added.  Will repeat cxr tomorrow am to see if he develops infiltrate after ivf bolus.    Active Problems:   Alcohol dependence ([place])- noted, monitor for signs of withdrawal,  None at this time   Hypertension- borderline soft bp, will hold his hydralazine, ace and coreg.     Chest pain, pleuritic in nature - ddimer is normal.  Likely due to developing pna  Obs on medical bed.  Full code.  Aubrie Lucien A 03/27/2016, 5:37 AM

## 2016-03-27 NOTE — Progress Notes (Signed)
Patient admitted to the hospital earlier this morning by Dr. Shanon Brow.  Patient seen and examined. He feels that his breathing is improving, although he still gets short of breath on exertion. Lungs are clear to auscultation bilaterally.  He was admitted to the hospital with COPD exacerbation. Started on nebulizer treatments and steroids. Concern for developing pneumonia since he is mildly febrile. Will repeat chest x-ray in a.m . chest x-ray is unremarkable and he has no further fevers, despite discharge home tomorrow. Blood pressures are trending up, will restart ACE inhibitor and hydralazine.  Zahira Brummond

## 2016-03-27 NOTE — ED Notes (Signed)
Belva Chimes, RN  house supervisor at bedside to assist with sepsis protocol.

## 2016-03-28 ENCOUNTER — Inpatient Hospital Stay (HOSPITAL_COMMUNITY): Payer: BLUE CROSS/BLUE SHIELD

## 2016-03-28 LAB — BASIC METABOLIC PANEL
ANION GAP: 8 (ref 5–15)
BUN: 16 mg/dL (ref 6–20)
CHLORIDE: 100 mmol/L — AB (ref 101–111)
CO2: 27 mmol/L (ref 22–32)
Calcium: 8.9 mg/dL (ref 8.9–10.3)
Creatinine, Ser: 0.63 mg/dL (ref 0.61–1.24)
GFR calc Af Amer: >60 mL/min (ref >60)
Glucose, Bld: 138 mg/dL — ABNORMAL HIGH (ref 65–99)
POTASSIUM: 4 mmol/L (ref 3.5–5.1)
SODIUM: 135 mmol/L (ref 135–145)

## 2016-03-28 LAB — CBC
HEMATOCRIT: 35.3 % — AB (ref 39.0–52.0)
HEMOGLOBIN: 11.9 g/dL — AB (ref 13.0–17.0)
MCH: 31.8 pg (ref 26.0–34.0)
MCHC: 33.7 g/dL (ref 30.0–36.0)
MCV: 94.4 fL (ref 78.0–100.0)
Platelets: 221 10*3/uL (ref 150–400)
RBC: 3.74 MIL/uL — ABNORMAL LOW (ref 4.22–5.81)
RDW: 14.5 % (ref 11.5–15.5)
WBC: 11.8 10*3/uL — AB (ref 4.0–10.5)

## 2016-03-28 MED ORDER — AZITHROMYCIN 250 MG PO TABS: 250.0000 mg | ORAL_TABLET | Freq: Every day | ORAL | Status: DC

## 2016-03-28 MED ORDER — IPRATROPIUM-ALBUTEROL 0.5-2.5 (3) MG/3ML IN SOLN
3.0000 mL | Freq: Four times a day (QID) | RESPIRATORY_TRACT | Status: DC
  Administered 2016-03-28 (×2): 3 mL via RESPIRATORY_TRACT
  Filled 2016-03-28 (×2): qty 3

## 2016-03-28 MED ORDER — PREDNISONE 10 MG PO TABS: ORAL_TABLET | ORAL | Status: DC

## 2016-03-28 MED ORDER — DEXTROSE 5 % IV SOLN
INTRAVENOUS | Status: AC
  Filled 2016-03-28: qty 10

## 2016-03-28 MED ORDER — DEXTROSE 5 % IV SOLN
INTRAVENOUS | Status: AC
  Filled 2016-03-28: qty 500

## 2016-03-28 MED ORDER — AZITHROMYCIN 250 MG PO TABS
250.0000 mg | ORAL_TABLET | Freq: Every day | ORAL | Status: DC
  Filled 2016-03-28: qty 1

## 2016-03-28 MED ORDER — NICOTINE 21 MG/24HR TD PT24: 21.0000 mg | MEDICATED_PATCH | Freq: Every day | TRANSDERMAL | Status: DC

## 2016-03-28 MED ORDER — PROAIR HFA 108 (90 BASE) MCG/ACT IN AERS: 2.0000 | INHALATION_SPRAY | RESPIRATORY_TRACT | Status: DC | PRN

## 2016-03-28 MED ORDER — GUAIFENESIN ER 600 MG PO TB12: 600.0000 mg | ORAL_TABLET | Freq: Two times a day (BID) | ORAL | Status: DC

## 2016-03-28 NOTE — Progress Notes (Signed)
Discharge instructions given on medications,and follow up visits, patient verbalized understanding. Prescriptions sent to Pharmacy of choice documented on AVS. Vital signs stable. Accompanied by staff to an awaiting vehicle.

## 2016-03-28 NOTE — Discharge Summary (Signed)
Physician Discharge Summary  Shaun May JYN:[id number] DOB: 04/09/1951 DOA: 03/27/2016  PCP: Shaun Neighbors, MD  Admit date: 03/27/2016 Discharge date: 03/28/2016  Time spent: 35 minutes  Recommendations for Outpatient Follow-up:  1. Follow up with PCP in 1-2 weeks.  Discharge Diagnoses:  Principal Problem:   COPD exacerbation (Shaun May) Active Problems:   Alcohol dependence ([place])   Hypertension   Chest pain   SOB (shortness of breath)   Discharge Condition: Improved   Diet recommendation: heart healthy   Filed Weights   03/27/16 0133 03/28/16 0530  Weight: 83.462 kg (184 lb) 84.868 kg (187 lb 1.6 oz)    History of present illness:  23 yom with history of tobacco abuse and HTN presented with complaints of worsening SOB and cough.Reported using his albuterol inhaler, without relief. In the ED noted to have wheezing and soft BP. He was referred for admission for possible pna with early sepsis.  Hospital Course: [name] was admitted for his COPD exacerbation. Initially thought to have early pna, but this was ruled out with unremarkable CXR and improvement in symptoms with current treatment.  Wheezing improved. Patient started on broad spectrum abx, IV solumedrol, and IVF. On discharge transitioned to oral abx, steroid taper, and albuterol inhaler. He is breathing comfortably on room air. Sepsis was ruled out.  1. Hypertension, borderline soft bp on admission has resolved. Resume hydralazine, ace and coreg. 2. Chest pain, pleuritic.likley developing pna. Follow up pna. 3. Tobacco use. Counseled on cessation.   Procedures:  None  Consultations:  None  Discharge Exam: Filed Vitals:   03/28/16 0530 03/28/16 1355  BP: 155/82 157/78  Pulse: 78 79  Temp: 98.9 F (37.2 C) 98.7 F (37.1 C)  Resp: 20 20    General: NAD, looks comfortable Cardiovascular: RRR, S1, S2  Respiratory: clear bilaterally, No wheezing, rales or rhonchi Abdomen: soft, non tender, no distention , bowel  sounds normal Musculoskeletal: No edema b/l  Discharge Instructions   Discharge Instructions    Diet - low sodium heart healthy    Complete by:  As directed      Increase activity slowly    Complete by:  As directed           Current Discharge Medication List    START taking these medications   Details  azithromycin (ZITHROMAX) 250 MG tablet Take 1 tablet (250 mg total) by mouth daily. Qty: 4 each, Refills: 0    guaiFENesin (MUCINEX) 600 MG 12 hr tablet Take 1 tablet (600 mg total) by mouth 2 (two) times daily. Qty: 30 tablet, Refills: 0    nicotine (NICODERM CQ - DOSED IN MG/24 HOURS) 21 mg/24hr patch Place 1 patch (21 mg total) onto the skin daily. Qty: 28 patch, Refills: 0    predniSONE (DELTASONE) 10 MG tablet Take '40mg'$  po daily for 2 days then '30mg'$  daily for 2 days then '20mg'$  daily for 2 days then '10mg'$  daily for 2 days then stop Qty: 20 tablet, Refills: 0      CONTINUE these medications which have CHANGED   Details  PROAIR HFA 108 (90 Base) MCG/ACT inhaler Inhale 2 puffs into the lungs every 4 (four) hours as needed for wheezing or shortness of breath. Qty: 1 Inhaler, Refills: 0      CONTINUE these medications which have NOT CHANGED   Details  carvedilol (COREG) 25 MG tablet Take 25 mg by mouth 2 (two) times daily with a meal.  Refills: 11    hydrALAZINE (APRESOLINE) 100  MG tablet Take 50 mg by mouth 3 (three) times daily.  Refills: 11    lisinopril (PRINIVIL,ZESTRIL) 40 MG tablet Take 40 mg by mouth daily.    LORazepam (ATIVAN) 2 MG tablet Take 1 tablet by mouth every 6 (six) hours as needed for anxiety.     oxyCODONE-acetaminophen (PERCOCET/ROXICET) 5-325 MG tablet Take 1-2 tablets by mouth every 6 (six) hours as needed. Qty: 15 tablet, Refills: 0    potassium chloride SA (K-DUR,KLOR-CON) 20 MEQ tablet Take 1 tablet by mouth daily.    rOPINIRole (REQUIP) 0.25 MG tablet Take 1-2 tablets by mouth at bedtime as needed (restless leg).  Refills: 1      STOP  taking these medications     methylPREDNISolone (MEDROL DOSEPAK) 4 MG TBPK tablet        No Known Allergies    The results of significant diagnostics from this hospitalization (including imaging, microbiology, ancillary and laboratory) are listed below for reference.    Significant Diagnostic Studies: Dg Chest 2 View  03/28/2016  CLINICAL DATA:  Shortness of breath EXAM: CHEST  2 VIEW COMPARISON:  03/27/2016 FINDINGS: Lungs are clear. Small bilateral pleural effusions. No pneumothorax. Stable cardiomegaly.  Left subclavian pacemaker. Degenerative changes of the visualized thoracolumbar spine. IMPRESSION: Small bilateral pleural effusions. Electronically Signed   By: Shaun May M.D.   On: 03/28/2016 11:11   Dg Chest 2 View  03/27/2016  CLINICAL DATA:  Chest pain and shortness of breath for 6 hours. EXAM: CHEST  2 VIEW COMPARISON:  10/29/2015 FINDINGS: Dual lead left-sided pacemaker in place. The ventricular lead is no slightly tortuous. There is cardiomegaly that is new from prior exam. No pulmonary edema. No confluent airspace disease. No pleural effusion or pneumothorax. There is degenerative change in the spine. IMPRESSION: 1. Cardiomegaly, new from exam 5 months prior. The ventricular lead of the dual lead pacemaker is tortuous. 2. No pulmonary edema or localizing process. Electronically Signed   By: Shaun May M.D.   On: 03/27/2016 02:44    Microbiology: Recent Results (from the past 240 hour(s))  Blood Culture (routine x 2)     Status: None (Preliminary result)   Collection Time: 03/27/16  2:57 AM  Result Value Ref Range Status   Specimen Description LEFT ANTECUBITAL  Final   Special Requests BOTTLES DRAWN AEROBIC ONLY 8CC  Final   Culture PENDING  Incomplete   Report Status PENDING  Incomplete  Blood Culture (routine x 2)     Status: None (Preliminary result)   Collection Time: 03/27/16  3:10 AM  Result Value Ref Range Status   Specimen Description BLOOD RIGHT ARM   Final   Special Requests   Final    BOTTLES DRAWN AEROBIC AND ANAEROBIC 6CC DRAWN BY RN   Culture PENDING  Incomplete   Report Status PENDING  Incomplete     Labs: Basic Metabolic Panel:  Recent Labs Lab 03/27/16 0145 03/28/16 0619  NA 131* 135  K 3.6 4.0  CL 96* 100*  CO2 25 27  GLUCOSE 153* 138*  BUN 25* 16  CREATININE 0.78 0.63  CALCIUM 9.1 8.9   Liver Function Tests:  Recent Labs Lab 03/27/16 0257  AST 46*  ALT 38  ALKPHOS 51  BILITOT 1.3*  PROT 7.0  ALBUMIN 3.9   No results for input(s): LIPASE, AMYLASE in the last 168 hours. No results for input(s): AMMONIA in the last 168 hours. CBC:  Recent Labs Lab 03/27/16 0145 03/28/16 0619  WBC 17.1* 11.8*  HGB 14.0 11.9*  HCT 40.9 35.3*  MCV 94.0 94.4  PLT 270 221   Cardiac Enzymes:  Recent Labs Lab 03/27/16 0640 03/27/16 1259 03/27/16 1926  TROPONINI 0.03 <0.03 <0.03   BNP: BNP (last 3 results)  Recent Labs  10/25/15 1420 03/27/16 0145  BNP 95.0 138.0*   CBG:  Recent Labs Lab 03/27/16 4163  GLUCAP 146*       Signed:  Kathie Dike, MD   [facility] 03/28/2016, 3:02 PM     By signing my name below, I, Rennis Harding, attest that this documentation has been prepared under the direction and in the presence of Kathie Dike, MD. Electronically signed: Rennis Harding, Scribe. 03/28/2016 2:45pm   I, Dr. Kathie Dike, personally performed the services described in this documentaiton. All medical record entries made by the scribe were at my direction and in my presence. I have reviewed the chart and agree that the record reflects my personal performance and is accurate and complete  Kathie Dike, MD, 03/28/2016 3:02 PM

## 2016-03-28 NOTE — Progress Notes (Signed)
PHARMACIST - PHYSICIAN COMMUNICATION DR:   Roderic Palau CONCERNING: Antibiotic IV to Oral Route Change Policy  RECOMMENDATION: This patient is receiving Zithromax by the intravenous route.  Based on criteria approved by the Pharmacy and Therapeutics Committee, the antibiotic(s) is/are being converted to the equivalent oral dose form(s).   DESCRIPTION: These criteria include:  Patient being treated for a respiratory tract infection, urinary tract infection, cellulitis or clostridium difficile associated diarrhea if on metronidazole  The patient is not neutropenic and does not exhibit a GI malabsorption state  The patient is eating (either orally or via tube) and/or has been taking other orally administered medications for a least 24 hours  The patient is improving clinically and has a Tmax < 100.5  If you have questions about this conversion, please contact the Pharmacy Department  '[x]'$   [phone] )  Shaun May '[]'$   [phone] )  Shaun May '[]'$   [phone] )  Shaun May '[]'$   [phone] )  [facility] '[]'$   [phone] )  [place], PharmD Clinical Pharmacist

## 2016-03-29 ENCOUNTER — Emergency Department (HOSPITAL_COMMUNITY): Payer: BLUE CROSS/BLUE SHIELD

## 2016-03-29 ENCOUNTER — Encounter (HOSPITAL_COMMUNITY): Payer: Self-pay | Admitting: *Deleted

## 2016-03-29 ENCOUNTER — Inpatient Hospital Stay (HOSPITAL_COMMUNITY)
Admission: EM | Admit: 2016-03-29 | Discharge: 2016-04-01 | DRG: 287 | Disposition: A | Discharge status: Home / Self Care | Payer: BLUE CROSS/BLUE SHIELD | Attending: Internal Medicine | Admitting: Internal Medicine

## 2016-03-29 DIAGNOSIS — I110 Hypertensive heart disease with heart failure: Secondary | ICD-10-CM | POA: Diagnosis present

## 2016-03-29 DIAGNOSIS — I214 Non-ST elevation (NSTEMI) myocardial infarction: Secondary | ICD-10-CM

## 2016-03-29 DIAGNOSIS — G2581 Restless legs syndrome: Secondary | ICD-10-CM | POA: Diagnosis present

## 2016-03-29 DIAGNOSIS — I5041 Acute combined systolic (congestive) and diastolic (congestive) heart failure: Secondary | ICD-10-CM | POA: Diagnosis not present

## 2016-03-29 DIAGNOSIS — I509 Heart failure, unspecified: Secondary | ICD-10-CM | POA: Diagnosis not present

## 2016-03-29 DIAGNOSIS — I3139 Other pericardial effusion (noninflammatory): Secondary | ICD-10-CM | POA: Insufficient documentation

## 2016-03-29 DIAGNOSIS — J441 Chronic obstructive pulmonary disease with (acute) exacerbation: Secondary | ICD-10-CM

## 2016-03-29 DIAGNOSIS — Z950 Presence of cardiac pacemaker: Secondary | ICD-10-CM | POA: Diagnosis not present

## 2016-03-29 DIAGNOSIS — E876 Hypokalemia: Secondary | ICD-10-CM | POA: Diagnosis present

## 2016-03-29 DIAGNOSIS — C3411 Malignant neoplasm of upper lobe, right bronchus or lung: Secondary | ICD-10-CM | POA: Diagnosis present

## 2016-03-29 DIAGNOSIS — R0602 Shortness of breath: Secondary | ICD-10-CM

## 2016-03-29 DIAGNOSIS — I248 Other forms of acute ischemic heart disease: Secondary | ICD-10-CM | POA: Diagnosis not present

## 2016-03-29 DIAGNOSIS — R911 Solitary pulmonary nodule: Secondary | ICD-10-CM | POA: Diagnosis present

## 2016-03-29 DIAGNOSIS — Z79899 Other long term (current) drug therapy: Secondary | ICD-10-CM

## 2016-03-29 DIAGNOSIS — D649 Anemia, unspecified: Secondary | ICD-10-CM | POA: Diagnosis present

## 2016-03-29 DIAGNOSIS — F1010 Alcohol abuse, uncomplicated: Secondary | ICD-10-CM | POA: Diagnosis present

## 2016-03-29 DIAGNOSIS — I4892 Unspecified atrial flutter: Secondary | ICD-10-CM | POA: Insufficient documentation

## 2016-03-29 DIAGNOSIS — J439 Emphysema, unspecified: Secondary | ICD-10-CM

## 2016-03-29 DIAGNOSIS — I483 Typical atrial flutter: Secondary | ICD-10-CM | POA: Diagnosis not present

## 2016-03-29 DIAGNOSIS — I10 Essential (primary) hypertension: Secondary | ICD-10-CM | POA: Diagnosis not present

## 2016-03-29 DIAGNOSIS — T380X5A Adverse effect of glucocorticoids and synthetic analogues, initial encounter: Secondary | ICD-10-CM | POA: Diagnosis present

## 2016-03-29 DIAGNOSIS — I5023 Acute on chronic systolic (congestive) heart failure: Secondary | ICD-10-CM | POA: Diagnosis present

## 2016-03-29 DIAGNOSIS — J449 Chronic obstructive pulmonary disease, unspecified: Secondary | ICD-10-CM | POA: Diagnosis present

## 2016-03-29 DIAGNOSIS — F17210 Nicotine dependence, cigarettes, uncomplicated: Secondary | ICD-10-CM | POA: Diagnosis present

## 2016-03-29 DIAGNOSIS — I313 Pericardial effusion (noninflammatory): Secondary | ICD-10-CM | POA: Diagnosis present

## 2016-03-29 DIAGNOSIS — K5900 Constipation, unspecified: Secondary | ICD-10-CM | POA: Diagnosis not present

## 2016-03-29 DIAGNOSIS — R778 Other specified abnormalities of plasma proteins: Secondary | ICD-10-CM | POA: Diagnosis present

## 2016-03-29 DIAGNOSIS — R7989 Other specified abnormal findings of blood chemistry: Secondary | ICD-10-CM

## 2016-03-29 LAB — COMPREHENSIVE METABOLIC PANEL
ALBUMIN: 3.8 g/dL (ref 3.5–5.0)
ALK PHOS: 73 U/L (ref 38–126)
ALT: 105 U/L — ABNORMAL HIGH (ref 17–63)
AST: 132 U/L — AB (ref 15–41)
Anion gap: 10 (ref 5–15)
BILIRUBIN TOTAL: 1.5 mg/dL — AB (ref 0.3–1.2)
BUN: 20 mg/dL (ref 6–20)
CALCIUM: 9.1 mg/dL (ref 8.9–10.3)
CHLORIDE: 99 mmol/L — AB (ref 101–111)
CO2: 27 mmol/L (ref 22–32)
CREATININE: 0.76 mg/dL (ref 0.61–1.24)
Glucose, Bld: 117 mg/dL — ABNORMAL HIGH (ref 65–99)
Potassium: 3.8 mmol/L (ref 3.5–5.1)
Sodium: 136 mmol/L (ref 135–145)
TOTAL PROTEIN: 7.6 g/dL (ref 6.5–8.1)

## 2016-03-29 LAB — HEPARIN LEVEL (UNFRACTIONATED): Heparin Unfractionated: <0.1 IU/mL — ABNORMAL LOW (ref 0.30–0.70)

## 2016-03-29 LAB — CBC WITH DIFFERENTIAL/PLATELET
BASOS ABS: 0 10*3/uL (ref 0.0–0.1)
BASOS PCT: 0 %
EOS ABS: 0 10*3/uL (ref 0.0–0.7)
EOS PCT: 0 %
HCT: 40.2 % (ref 39.0–52.0)
Hemoglobin: 13.3 g/dL (ref 13.0–17.0)
LYMPHS ABS: 1.4 10*3/uL (ref 0.7–4.0)
Lymphocytes Relative: 7 %
MCH: 31.5 pg (ref 26.0–34.0)
MCHC: 33.1 g/dL (ref 30.0–36.0)
MCV: 95.3 fL (ref 78.0–100.0)
Monocytes Absolute: 0.8 10*3/uL (ref 0.1–1.0)
Monocytes Relative: 4 %
NEUTROS PCT: 89 %
Neutro Abs: 17.4 10*3/uL — ABNORMAL HIGH (ref 1.7–7.7)
PLATELETS: 269 10*3/uL (ref 150–400)
RBC: 4.22 MIL/uL (ref 4.22–5.81)
RDW: 14.6 % (ref 11.5–15.5)
WBC: 19.6 10*3/uL — AB (ref 4.0–10.5)

## 2016-03-29 LAB — I-STAT TROPONIN, ED: TROPONIN I, POC: 0.17 ng/mL — AB (ref 0.00–0.08)

## 2016-03-29 LAB — URINE CULTURE

## 2016-03-29 LAB — TROPONIN I: TROPONIN I: 0.69 ng/mL — AB (ref <0.031)

## 2016-03-29 LAB — TSH: TSH: 0.514 u[IU]/mL (ref 0.350–4.500)

## 2016-03-29 LAB — BRAIN NATRIURETIC PEPTIDE: B NATRIURETIC PEPTIDE 5: 488 pg/mL — AB (ref 0.0–100.0)

## 2016-03-29 LAB — D-DIMER, QUANTITATIVE: D-Dimer, Quant: 1.19 ug/mL-FEU — ABNORMAL HIGH (ref 0.00–0.50)

## 2016-03-29 MED ORDER — HEPARIN BOLUS VIA INFUSION
2500.0000 [IU] | Freq: Once | INTRAVENOUS | Status: AC
  Administered 2016-03-29: 2500 [IU] via INTRAVENOUS
  Filled 2016-03-29: qty 2500

## 2016-03-29 MED ORDER — ACETAMINOPHEN 325 MG PO TABS: 650.0000 mg | ORAL_TABLET | ORAL | Status: DC | PRN

## 2016-03-29 MED ORDER — POTASSIUM CHLORIDE CRYS ER 20 MEQ PO TBCR
20.0000 meq | EXTENDED_RELEASE_TABLET | Freq: Every day | ORAL | Status: DC
  Administered 2016-03-29 – 2016-04-01 (×4): 20 meq via ORAL
  Filled 2016-03-29 (×4): qty 1

## 2016-03-29 MED ORDER — HYDRALAZINE HCL 25 MG PO TABS
50.0000 mg | ORAL_TABLET | Freq: Three times a day (TID) | ORAL | Status: DC
Start: 2016-03-29 — End: 2016-03-31
  Administered 2016-03-29 – 2016-03-31 (×6): 50 mg via ORAL
  Filled 2016-03-29 (×7): qty 2

## 2016-03-29 MED ORDER — SODIUM CHLORIDE 0.9 % IV SOLN
INTRAVENOUS | Status: DC
  Administered 2016-03-29: 08:00:00 via INTRAVENOUS

## 2016-03-29 MED ORDER — FUROSEMIDE 10 MG/ML IJ SOLN
40.0000 mg | Freq: Once | INTRAMUSCULAR | Status: AC
  Administered 2016-03-29: 40 mg via INTRAVENOUS
  Filled 2016-03-29: qty 4

## 2016-03-29 MED ORDER — HEPARIN BOLUS VIA INFUSION
4000.0000 [IU] | Freq: Once | INTRAVENOUS | Status: AC
  Administered 2016-03-29: 4000 [IU] via INTRAVENOUS

## 2016-03-29 MED ORDER — SODIUM CHLORIDE 0.9% FLUSH
3.0000 mL | Freq: Two times a day (BID) | INTRAVENOUS | Status: DC
  Administered 2016-03-29 – 2016-03-31 (×4): 3 mL via INTRAVENOUS

## 2016-03-29 MED ORDER — ROPINIROLE HCL 0.25 MG PO TABS
0.2500 mg | ORAL_TABLET | Freq: Every evening | ORAL | Status: DC | PRN
  Filled 2016-03-29 (×2): qty 2

## 2016-03-29 MED ORDER — SODIUM CHLORIDE 0.9 % IV SOLN: 250.0000 mL | INTRAVENOUS | Status: DC | PRN

## 2016-03-29 MED ORDER — HEPARIN (PORCINE) IN NACL 100-0.45 UNIT/ML-% IJ SOLN: 14.0000 [IU]/kg/h | Freq: Once | INTRAMUSCULAR | Status: DC

## 2016-03-29 MED ORDER — ONDANSETRON HCL 4 MG/2ML IJ SOLN: 4.0000 mg | Freq: Four times a day (QID) | INTRAMUSCULAR | Status: DC | PRN

## 2016-03-29 MED ORDER — HEPARIN (PORCINE) IN NACL 100-0.45 UNIT/ML-% IJ SOLN
1350.0000 [IU]/h | INTRAMUSCULAR | Status: DC
  Administered 2016-03-29: 1050 [IU]/h via INTRAVENOUS
  Administered 2016-03-30: 1350 [IU]/h via INTRAVENOUS
  Filled 2016-03-29 (×2): qty 250

## 2016-03-29 MED ORDER — SODIUM CHLORIDE 0.9 % IV SOLN: INTRAVENOUS | Status: DC

## 2016-03-29 MED ORDER — OXYCODONE-ACETAMINOPHEN 5-325 MG PO TABS
1.0000 | ORAL_TABLET | Freq: Four times a day (QID) | ORAL | Status: DC | PRN
  Administered 2016-03-29: 2 via ORAL
  Administered 2016-03-31: 23:00:00 1 via ORAL
  Filled 2016-03-29: qty 1
  Filled 2016-03-29: qty 2

## 2016-03-29 MED ORDER — GUAIFENESIN ER 600 MG PO TB12
600.0000 mg | ORAL_TABLET | Freq: Two times a day (BID) | ORAL | Status: DC
  Administered 2016-03-29 – 2016-04-01 (×6): 600 mg via ORAL
  Filled 2016-03-29 (×6): qty 1

## 2016-03-29 MED ORDER — HEPARIN SODIUM (PORCINE) 5000 UNIT/ML IJ SOLN: 4000.0000 [IU] | Freq: Once | INTRAMUSCULAR | Status: DC

## 2016-03-29 MED ORDER — FUROSEMIDE 10 MG/ML IJ SOLN
40.0000 mg | Freq: Two times a day (BID) | INTRAMUSCULAR | Status: DC
  Administered 2016-03-29 – 2016-03-30 (×3): 40 mg via INTRAVENOUS
  Filled 2016-03-29 (×4): qty 4

## 2016-03-29 MED ORDER — CARVEDILOL 12.5 MG PO TABS
25.0000 mg | ORAL_TABLET | Freq: Two times a day (BID) | ORAL | Status: DC
  Administered 2016-03-30 – 2016-04-01 (×6): 25 mg via ORAL
  Filled 2016-03-29 (×6): qty 2

## 2016-03-29 MED ORDER — NICOTINE 21 MG/24HR TD PT24
21.0000 mg | MEDICATED_PATCH | Freq: Every day | TRANSDERMAL | Status: DC
  Administered 2016-03-29 – 2016-04-01 (×4): 21 mg via TRANSDERMAL
  Filled 2016-03-29 (×4): qty 1

## 2016-03-29 MED ORDER — LORAZEPAM 0.5 MG PO TABS
1.0000 mg | ORAL_TABLET | Freq: Three times a day (TID) | ORAL | Status: DC | PRN
  Administered 2016-03-29 – 2016-03-31 (×3): 1 mg via ORAL
  Filled 2016-03-29 (×2): qty 2
  Filled 2016-03-29: qty 1

## 2016-03-29 MED ORDER — IOHEXOL 350 MG/ML SOLN
100.0000 mL | Freq: Once | INTRAVENOUS | Status: AC | PRN
  Administered 2016-03-29: 100 mL via INTRAVENOUS

## 2016-03-29 MED ORDER — LISINOPRIL 40 MG PO TABS
40.0000 mg | ORAL_TABLET | Freq: Every day | ORAL | Status: DC
  Administered 2016-03-30 – 2016-04-01 (×3): 40 mg via ORAL
  Filled 2016-03-29: qty 1
  Filled 2016-03-29: qty 4
  Filled 2016-03-29: qty 1

## 2016-03-29 MED ORDER — ALBUTEROL SULFATE (2.5 MG/3ML) 0.083% IN NEBU: 2.5000 mg | INHALATION_SOLUTION | RESPIRATORY_TRACT | Status: DC | PRN

## 2016-03-29 MED ORDER — ASPIRIN 81 MG PO CHEW
324.0000 mg | CHEWABLE_TABLET | Freq: Once | ORAL | Status: AC
  Administered 2016-03-29: 324 mg via ORAL
  Filled 2016-03-29: qty 4

## 2016-03-29 MED ORDER — ASPIRIN EC 81 MG PO TBEC
81.0000 mg | DELAYED_RELEASE_TABLET | Freq: Every day | ORAL | Status: DC
  Administered 2016-03-29 – 2016-04-01 (×3): 81 mg via ORAL
  Filled 2016-03-29 (×4): qty 1

## 2016-03-29 MED ORDER — SODIUM CHLORIDE 0.9% FLUSH: 3.0000 mL | INTRAVENOUS | Status: DC | PRN

## 2016-03-29 MED ORDER — PREDNISONE 20 MG PO TABS
20.0000 mg | ORAL_TABLET | Freq: Every day | ORAL | Status: DC
  Administered 2016-03-30 – 2016-04-01 (×3): 20 mg via ORAL
  Filled 2016-03-29 (×3): qty 1

## 2016-03-29 NOTE — ED Notes (Signed)
[facility] called out for sob. Pt was discharged yesterday from here on April 22 nd for COPD exacerbation. Pt reports feeling sob since being discharged but pt got worse during the night tonight. 3 breathing treatments en route and 125 mg solumedrol. [facility] attempted to place pt on cpap but pt is claustrophobic and pt could not tolerate.Denies chest pain or any other pain. Pt is on zithromax

## 2016-03-29 NOTE — ED Notes (Signed)
Patient ambulatory to bathroom without assistance. Patient's o2 sat without oxygen 95-97% Patient was very anxious about walking

## 2016-03-29 NOTE — Progress Notes (Signed)
ANTICOAGULATION CONSULT NOTE - follow up  Pharmacy Consult for HEPARIN Indication: chest pain/ACS  No Known Allergies  Patient Measurements: Height: '5\' 10"'$  (177.8 cm) Weight: 189 lb (85.73 kg) IBW/kg (Calculated) : 73 HEPARIN DW (KG): 85.7  Vital Signs: Temp: 98.2 F (36.8 C) (04/24 2100) Temp Source: Oral (04/24 2100) BP: 133/79 mmHg (04/24 2100) Pulse Rate: 71 (04/24 2100)  Labs:  Recent Labs  03/27/16 0145  03/27/16 1259 03/27/16 1926 03/28/16 0619 03/29/16 0807 03/29/16 2020  HGB 14.0  --   --   --  11.9* 13.3  --   HCT 40.9  --   --   --  35.3* 40.2  --   PLT 270  --   --   --  221 269  --   HEPARINUNFRC  --   --   --   --   --   --  <0.10*  CREATININE 0.78  --   --   --  0.63 0.76  --   TROPONINI  --   < > <0.03 <0.03  --   --  0.69*  < > = values in this interval not displayed. Estimated Creatinine Clearance: 96.3 mL/min (by C-G formula based on Cr of 0.76).  Medical History: Past Medical History  Diagnosis Date  . Hypertension   . Hemochromatosis   . Mobitz type 2 second degree heart block     PPM MDT 10/28/15 Dr. Lovena Le   Medications:  Prescriptions prior to admission  Medication Sig Dispense Refill Last Dose  . azithromycin (ZITHROMAX) 250 MG tablet Take 1 tablet (250 mg total) by mouth daily. 4 each 0 unknown  . carvedilol (COREG) 25 MG tablet Take 25 mg by mouth 2 (two) times daily with a meal.   11 03/29/2016 at 1600  . guaiFENesin (MUCINEX) 600 MG 12 hr tablet Take 1 tablet (600 mg total) by mouth 2 (two) times daily. 30 tablet 0 unknown  . hydrALAZINE (APRESOLINE) 100 MG tablet Take 50 mg by mouth 3 (three) times daily.   11 03/29/2016 at Unknown time  . lisinopril (PRINIVIL,ZESTRIL) 40 MG tablet Take 40 mg by mouth daily.   03/29/2016 at Unknown time  . LORazepam (ATIVAN) 2 MG tablet Take 1 tablet by mouth every 6 (six) hours as needed for anxiety.    03/29/2016 at Unknown time  . nicotine (NICODERM CQ - DOSED IN MG/24 HOURS) 21 mg/24hr patch Place  1 patch (21 mg total) onto the skin daily. 28 patch 0 03/29/2016 at Unknown time  . oxyCODONE-acetaminophen (PERCOCET/ROXICET) 5-325 MG tablet Take 1-2 tablets by mouth every 6 (six) hours as needed. (Patient taking differently: Take 1-2 tablets by mouth every 6 (six) hours as needed for moderate pain. ) 15 tablet 0 03/29/2016 at Unknown time  . potassium chloride SA (K-DUR,KLOR-CON) 20 MEQ tablet Take 1 tablet by mouth daily.   03/28/2016 at Unknown time  . predniSONE (DELTASONE) 10 MG tablet Take '40mg'$  po daily for 2 days then '30mg'$  daily for 2 days then '20mg'$  daily for 2 days then '10mg'$  daily for 2 days then stop 20 tablet 0 03/28/2016 at Unknown time  . PROAIR HFA 108 (90 Base) MCG/ACT inhaler Inhale 2 puffs into the lungs every 4 (four) hours as needed for wheezing or shortness of breath. 1 Inhaler 0 03/28/2016 at Unknown time  . rOPINIRole (REQUIP) 0.25 MG tablet Take 1-2 tablets by mouth at bedtime as needed (restless leg).   1 03/28/2016 at Unknown time    Assessment:  65yo male recently d/c'd from hospital due to COPD exacerbation.  Pt now c/o SOB.  Asked to initiate Heparin for ACS. Heparin level is <0.1, subtherapeutic.  Goal of Therapy:  Heparin level 0.3-0.7 units/ml Monitor platelets by anticoagulation protocol: Yes   Plan:  Heparin 2500 unit bolus now x 1 Increase Heparin infusion to 1350 units/hr Heparin level in 6-8 hrs Heparin level and CBC daily while on IV Heparin  Isac Sarna, BS [place], BCPS Clinical Pharmacist Pager [phone] 03/29/2016,9:36 PM

## 2016-03-29 NOTE — Plan of Care (Signed)
RN paged elevated troponin of .69 to NP. Pt is NOT having active CP and is already on Heparin drip. Cardiology has been consulted by admitting MD. Will continue to trend enzymes. Continue Heparin. Should pt develop CP, will call cardiology on call.  [facility] NP [facility]

## 2016-03-29 NOTE — H&P (Signed)
History and Physical    Shaun May MWU:[id number] DOB: November 04, 1951 DOA: 03/29/2016  Referring MD/NP/PA: Dr. Rogene Houston PCP: Wende Neighbors, MD Outpatient Specialists:  Electrophysiology, Dr. Lovena Le Patient coming from: Home via EMS  Chief Complaint: shortness of breath  HPI: Shaun May is a 65 y.o. male with medical history significant of second-degree heart block status post pacemaker, hypertension, chronic tobacco use and probable COPD. Patient was just discharged from hospital yesterday after being treated for a COPD exacerbation/bronchitis. He was treated with steroids, antibiotics and nebulizer treatments. At the time of discharge reports that her shortness of breath had improved and was breathing comfortably on room air. He returned home and reports that the same night, he developed progressive shortness of breath. He describes shortness of breath on exertion also orthopnea. Denies any chest pain. This morning he noticed that he was wheezing. He also had a cough. EMS was called and patient received several nebulizer treatments which she reported some improvement. By the time he arrived to the emergency room, reportedly his wheezing had resolved. He was placed on oxygen which she feels improved his symptoms. Workup in the emergency room indicated evidence of pleural effusion as well as pericardial effusion. CT scan of the chest was negative for pulmonary embolus. Troponin was noted to be mildly elevated at 0.17. He has been referred for admission.  Review of Systems: pertinent positives as per HPI, otherwise negative  Past Medical History  Diagnosis Date  . Hypertension   . Hemochromatosis   . Mobitz type 2 second degree heart block     PPM MDT 10/28/15 Dr. Lovena Le    Past Surgical History  Procedure Laterality Date  . Ep implantable device N/A 10/28/2015    Procedure: Pacemaker Implant;  Surgeon: Evans Lance, MD;  Location: [facility];  Service: Cardiovascular;  Laterality:  N/A;  . Ep implantable device N/A 11/07/2015    Procedure: Pocket Revision;  Surgeon: Evans Lance, MD;  Location: [facility];  Service: Cardiovascular;  Laterality: N/A;     reports that he has been smoking Cigarettes.  He has been smoking about 2.50 packs per day. He does not have any smokeless tobacco history on file. He reports that he drinks about 12.6 oz of alcohol per week. He reports that he does not use illicit drugs.  No Known Allergies  Family History  Problem Relation Age of Onset  . Diabetes Other      Prior to Admission medications   Medication Sig Start Date End Date Taking? Authorizing Provider  azithromycin (ZITHROMAX) 250 MG tablet Take 1 tablet (250 mg total) by mouth daily. 03/28/16  Yes Kathie Dike, MD  carvedilol (COREG) 25 MG tablet Take 25 mg by mouth 2 (two) times daily with a meal.  01/23/16  Yes Historical Provider, MD  guaiFENesin (MUCINEX) 600 MG 12 hr tablet Take 1 tablet (600 mg total) by mouth 2 (two) times daily. 03/28/16  Yes Kathie Dike, MD  hydrALAZINE (APRESOLINE) 100 MG tablet Take 50 mg by mouth 3 (three) times daily.  01/23/16  Yes Historical Provider, MD  lisinopril (PRINIVIL,ZESTRIL) 40 MG tablet Take 40 mg by mouth daily.   Yes Historical Provider, MD  LORazepam (ATIVAN) 2 MG tablet Take 1 tablet by mouth every 6 (six) hours as needed for anxiety.  10/01/15  Yes Historical Provider, MD  nicotine (NICODERM CQ - DOSED IN MG/24 HOURS) 21 mg/24hr patch Place 1 patch (21 mg total) onto the skin daily. 03/28/16  Yes Kathie Dike, MD  oxyCODONE-acetaminophen (PERCOCET/ROXICET) 5-325 MG tablet Take 1-2 tablets by mouth every 6 (six) hours as needed. Patient taking differently: Take 1-2 tablets by mouth every 6 (six) hours as needed for moderate pain.  01/20/16  Yes Kristen N Ward, DO  potassium chloride SA (K-DUR,KLOR-CON) 20 MEQ tablet Take 1 tablet by mouth daily. 10/14/15  Yes Historical Provider, MD  predniSONE (DELTASONE) 10 MG tablet Take  '40mg'$  po daily for 2 days then '30mg'$  daily for 2 days then '20mg'$  daily for 2 days then '10mg'$  daily for 2 days then stop 03/28/16  Yes Kathie Dike, MD  PROAIR HFA 108 [phone] Base) MCG/ACT inhaler Inhale 2 puffs into the lungs every 4 (four) hours as needed for wheezing or shortness of breath. 03/28/16  Yes Kathie Dike, MD  rOPINIRole (REQUIP) 0.25 MG tablet Take 1-2 tablets by mouth at bedtime as needed (restless leg).  02/25/16  Yes Historical Provider, MD    Physical Exam: Filed Vitals:   03/29/16 1430 03/29/16 1500 03/29/16 1530 03/29/16 1553  BP: 173/98 171/92 179/108 176/99  Pulse: 73 73 78 86  Temp:    98 F (36.7 C)  TempSrc:    Oral  Resp: '24 23 24 24  '$ Height:      Weight:      SpO2: 95% 95% 99% 96%      Constitutional: NAD, calm, comfortable Filed Vitals:   03/29/16 1430 03/29/16 1500 03/29/16 1530 03/29/16 1553  BP: 173/98 171/92 179/108 176/99  Pulse: 73 73 78 86  Temp:    98 F (36.7 C)  TempSrc:    Oral  Resp: '24 23 24 24  '$ Height:      Weight:      SpO2: 95% 95% 99% 96%   Eyes: PERRL, lids and conjunctivae normal ENMT: Mucous membranes are moist. Posterior pharynx clear of any exudate or lesions.Normal dentition.  Neck: normal, supple, no masses, no thyromegaly Respiratory: diminished breath sounds at bases with some crackles. Normal respiratory effort. No accessory muscle use.  Cardiovascular: Regular rate and rhythm, no murmurs / rubs / gallops. 1+ extremity edema. 2+ pedal pulses. No carotid bruits.  Abdomen: no tenderness, no masses palpated. No hepatosplenomegaly. Bowel sounds positive.  Musculoskeletal: no clubbing / cyanosis. No joint deformity upper and lower extremities. Good ROM, no contractures. Normal muscle tone.  Skin: no rashes, lesions, ulcers. No induration Neurologic: CN 2-12 grossly intact. Sensation intact, DTR normal. Strength 5/5 in all 4.  Psychiatric: Normal judgment and insight. Alert and oriented x 3. Normal mood.   Labs on Admission: I  have personally reviewed following labs and imaging studies  CBC:  Recent Labs Lab 03/27/16 0145 03/28/16 0619 03/29/16 0807  WBC 17.1* 11.8* 19.6*  NEUTROABS  --   --  17.4*  HGB 14.0 11.9* 13.3  HCT 40.9 35.3* 40.2  MCV 94.0 94.4 95.3  PLT 270 221 962   Basic Metabolic Panel:  Recent Labs Lab 03/27/16 0145 03/28/16 0619 03/29/16 0807  NA 131* 135 136  K 3.6 4.0 3.8  CL 96* 100* 99*  CO2 '25 27 27  '$ GLUCOSE 153* 138* 117*  BUN 25* 16 20  CREATININE 0.78 0.63 0.76  CALCIUM 9.1 8.9 9.1   GFR: Estimated Creatinine Clearance: 96.3 mL/min (by C-G formula based on Cr of 0.76). Liver Function Tests:  Recent Labs Lab 03/27/16 0257 03/29/16 0807  AST 46* 132*  ALT 38 105*  ALKPHOS 51 73  BILITOT 1.3* 1.5*  PROT 7.0 7.6  ALBUMIN 3.9 3.8   No results for input(s): LIPASE, AMYLASE in the last 168 hours. No results for input(s): AMMONIA in the last 168 hours. Coagulation Profile: No results for input(s): INR, PROTIME in the last 168 hours. Cardiac Enzymes:  Recent Labs Lab 03/27/16 0640 03/27/16 1259 03/27/16 1926  TROPONINI 0.03 <0.03 <0.03   BNP (last 3 results) No results for input(s): PROBNP in the last 8760 hours. HbA1C: No results for input(s): HGBA1C in the last 72 hours. CBG:  Recent Labs Lab 03/27/16 0811  GLUCAP 146*   Lipid Profile: No results for input(s): CHOL, HDL, LDLCALC, TRIG, CHOLHDL, LDLDIRECT in the last 72 hours. Thyroid Function Tests: No results for input(s): TSH, T4TOTAL, FREET4, T3FREE, THYROIDAB in the last 72 hours. Anemia Panel: No results for input(s): VITAMINB12, FOLATE, FERRITIN, TIBC, IRON, RETICCTPCT in the last 72 hours. Urine analysis:    Component Value Date/Time   COLORURINE AMBER* 03/27/2016 0836   APPEARANCEUR HAZY* 03/27/2016 0836   LABSPEC 1.025 03/27/2016 0836   PHURINE 6.0 03/27/2016 0836   GLUCOSEU 100* 03/27/2016 0836   HGBUR NEGATIVE 03/27/2016 0836   BILIRUBINUR NEGATIVE 03/27/2016 0836   KETONESUR  NEGATIVE 03/27/2016 0836   PROTEINUR 30* 03/27/2016 0836   NITRITE NEGATIVE 03/27/2016 0836   LEUKOCYTESUR NEGATIVE 03/27/2016 0836   Sepsis Labs: '@LABRCNTIP'$ (procalcitonin:4,lacticidven:4) ) Recent Results (from the past 240 hour(s))  Blood Culture (routine x 2)     Status: None (Preliminary result)   Collection Time: 03/27/16  2:57 AM  Result Value Ref Range Status   Specimen Description LEFT ANTECUBITAL  Final   Special Requests BOTTLES DRAWN AEROBIC ONLY 8CC  Final   Culture NO GROWTH 2 DAYS  Final   Report Status PENDING  Incomplete  Blood Culture (routine x 2)     Status: None (Preliminary result)   Collection Time: 03/27/16  3:10 AM  Result Value Ref Range Status   Specimen Description BLOOD RIGHT ARM  Final   Special Requests   Final    BOTTLES DRAWN AEROBIC AND ANAEROBIC 6CC DRAWN BY RN   Culture NO GROWTH 2 DAYS  Final   Report Status PENDING  Incomplete  Culture, Urine     Status: Abnormal   Collection Time: 03/27/16  8:36 AM  Result Value Ref Range Status   Specimen Description URINE, CLEAN CATCH  Final   Special Requests NONE  Final   Culture (A)  Final    1,000 COLONIES/mL INSIGNIFICANT GROWTH Performed at [facility]    Report Status 03/29/2016 FINAL  Final     Radiological Exams on Admission: Dg Chest 2 View  03/29/2016  CLINICAL DATA:  Shortness of breath.  COPD exacerbation. EXAM: CHEST  2 VIEW COMPARISON:  03/28/2016 FINDINGS: mild hyperinflation. moderate thoracic spondylosis. Prominent upper thoracic osteophytes, causing increased density over the spine on the lateral view. Pacer with leads at right atrium and right ventricle. No lead discontinuity. Midline trachea. Mild cardiomegaly with transverse aortic atherosclerosis. Trace bilateral pleural effusions. Mild pulmonary venous congestion. No lobar consolidation. No pneumothorax. IMPRESSION: Cardiomegaly and mild pulmonary venous congestion. Probable trace bilateral pleural effusions. Electronically  Signed   By: Abigail Miyamoto M.D.   On: 03/29/2016 08:17   Dg Chest 2 View  03/28/2016  CLINICAL DATA:  Shortness of breath EXAM: CHEST  2 VIEW COMPARISON:  03/27/2016 FINDINGS: Lungs are clear. Small bilateral pleural effusions. No pneumothorax. Stable cardiomegaly.  Left subclavian pacemaker. Degenerative changes of the visualized thoracolumbar spine. IMPRESSION: Small bilateral pleural effusions. Electronically Signed  By: Julian Hy M.D.   On: 03/28/2016 11:11   Ct Angio Chest Pe W/cm &/or Wo Cm  03/29/2016  CLINICAL DATA:  [facility] called out for sob. Pt was discharged yesterday from here on April 22 nd for COPD exacerbation. Pt reports feeling sob since being discharged but pt got worse during the night tonight EXAM: CT ANGIOGRAPHY CHEST WITH CONTRAST TECHNIQUE: Multidetector CT imaging of the chest was performed using the standard protocol during bolus administration of intravenous contrast. Multiplanar CT image reconstructions and MIPs were obtained to evaluate the vascular anatomy. CONTRAST:  191m OMNIPAQUE IOHEXOL 350 MG/ML SOLN COMPARISON:  03/14/2008 FINDINGS: Vascular: Left subclavian pacemaker leads extend to the right atrium and right ventricular apex. Right arm IV contrast injection. The SVC is patent. Mild right atrial enlargement. The right ventricle is nondilated. Mild dilatation of central pulmonary arteries. Patent bilateral pulmonary veins drain into the left atrium. Scattered coronary calcifications. Adequate contrast opacification of the thoracic aorta with no evidence of dissection, aneurysm, or stenosis. There is classic 3-vessel brachiocephalic arch anatomy without proximal stenosis. Scattered calcifications in the arch and descending thoracic segment. Visualized suprarenal abdominal aorta shows minimal atheromatous plaque. Mediastinum/Lymph Nodes: No masses or pathologically enlarged lymph nodes identified. Moderate pericardial effusion. Lungs/Pleura: Bilateral small pleural  effusions. Pulmonary emphysema. 17 mm spiculated nodule in posterior segment right upper lobe image 37/5. Subsegmental dependent atelectasis posteriorly in both lower lobes. Upper abdomen: No acute findings. Musculoskeletal: Flowing osteophytes across multiple contiguous levels in the mid and lower thoracic spine. Sternum intact. Review of the MIP images confirms the above findings. IMPRESSION: 1. Negative for acute PE or thoracic aortic dissection. 2. Pericardial and bilateral pleural effusions. 3. 17 mm spiculated posterior right upper lobe nodule suggesting primary bronchogenic carcinoma. Consider thoracic surgical consultation. 4. Atherosclerosis, including aortic and coronary artery disease. Please note that although the presence of coronary artery calcium documents the presence of coronary artery disease, the severity of this disease and any potential stenosis cannot be assessed on this non-gated CT examination. Assessment for potential risk factor modification, dietary therapy or pharmacologic therapy may be warranted, if clinically indicated. Electronically Signed   By: DLucrezia EuropeM.D.   On: 03/29/2016 13:32    EKG: Independently reviewed. No change from prior EKG  Assessment/Plan Active Problems:   Essential hypertension   SOB (shortness of breath)   Acute CHF (congestive heart failure) (HCC)   COPD (chronic obstructive pulmonary disease) (HCC)   Elevated troponin   Restless leg syndrome    Acute CHF. Patient noted to have significant pleural effusion and pericardial effusion. He does have other evidence of volume overload including lower extremity edema. BNP is mildly elevated. We'll start the patient on intravenous Lasix. He is already on an ACE inhibitor and beta blocker. Check echocardiogram.  Elevated troponin. In the ER, he was felt to have a possible non-STEMI and he was started on heparin infusion. This may also be demand ischemia in the setting of congestive heart failure.  Cardiology consultation will be requested.  COPD. No evidence of wheezing at this time. Continue prednisone taper that was prescribed on last discharge. Continue nebulizer treatments.  Incidental finding of right upper lobe pulmonary nodule. Nodule spiculated concerning for primary bronchogenic carcinoma. He will need further evaluation with oncology/cardiothoracic surgery as an outpatient.  Hypertension. Continue outpatient regimen.    DVT prophylaxis: heparin infusion Code Status: full code Family Communication: discussed with patient and girlfriend at the bedside Disposition Plan: discharge home once improved Consults called:  cardiology Admission status: inpatient   [facility] MD [facility] Pager [phone]  If 7PM-7AM, please contact night-coverage [email or web address] Password [facility]  03/29/2016, 7:25 PM

## 2016-03-29 NOTE — Progress Notes (Signed)
ANTICOAGULATION CONSULT NOTE - Initial Consult  Pharmacy Consult for HEPARIN Indication: chest pain/ACS  No Known Allergies  Patient Measurements: Height: '5\' 10"'$  (177.8 cm) Weight: 189 lb (85.73 kg) IBW/kg (Calculated) : 73 HEPARIN DW (KG): 85.7  Vital Signs: Temp: 96.7 F (35.9 C) (04/24 0651) Temp Source: Oral (04/24 0651) BP: 173/102 mmHg (04/24 1400) Pulse Rate: 76 (04/24 1400)  Labs:  Recent Labs  03/27/16 0145 03/27/16 0640 03/27/16 1259 03/27/16 1926 03/28/16 0619 03/29/16 0807  HGB 14.0  --   --   --  11.9* 13.3  HCT 40.9  --   --   --  35.3* 40.2  PLT 270  --   --   --  221 269  CREATININE 0.78  --   --   --  0.63 0.76  TROPONINI  --  0.03 <0.03 <0.03  --   --    Estimated Creatinine Clearance: 96.3 mL/min (by C-G formula based on Cr of 0.76).  Medical History: Past Medical History  Diagnosis Date  . Hypertension   . Hemochromatosis   . Mobitz type 2 second degree heart block     PPM MDT 10/28/15 Dr. Lovena Le   Medications:   (Not in a hospital admission)  Assessment: 65yo male recently d/c'd from hospital due to COPD exacerbation.  Pt now c/o SOB.  Asked to initiate Heparin for ACS.  Goal of Therapy:  Heparin level 0.3-0.7 units/ml Monitor platelets by anticoagulation protocol: Yes   Plan:  Heparin 4000 unit bolus now x 1 Heparin infusion at 1050 units/hr Heparin level in 6-8 hrs Heparin level and CBC daily while on IV Heparin  [place] Crane, Kaid Seeberger A 03/29/2016,2:45 PM

## 2016-03-29 NOTE — ED Provider Notes (Signed)
CSN: [id number]     Arrival date & time 03/29/16  0645 History   First MD Initiated Contact with Patient 03/29/16 [phone]     Chief Complaint  Patient presents with  . Shortness of Breath     (Consider location/radiation/quality/duration/timing/severity/associated sxs/prior Treatment) Patient is a 65 y.o. male presenting with shortness of breath. The history is provided by the patient, the EMS personnel and the spouse.  Shortness of Breath Associated symptoms: wheezing   Associated symptoms: no abdominal pain, no chest pain, no fever, no headaches, no rash and no vomiting    patient brought in by EMS. At the house patient was in severe respiratory distress. Patient stated that he started to have a lot of breathing problems last evening got a lot worse during the night. EMS gave him 3 nebulizers & Solu Medrol. They called stating that he was in significant respiratory distress and we expect that he may require intubation or BiPAP. However patient arrived and everything had resolved.  Patient was just discharged from the hospital yesterday he was in for exacerbation of COPD. Also had a cardiac workup with negative troponins and negative d-dimer's. Patient agreed that he felt better when he first went home to start to feel having trouble breathing last evening.  Past Medical History  Diagnosis Date  . Hypertension   . Hemochromatosis   . Mobitz type 2 second degree heart block     PPM MDT 10/28/15 Dr. Lovena Le   Past Surgical History  Procedure Laterality Date  . Ep implantable device N/A 10/28/2015    Procedure: Pacemaker Implant;  Surgeon: Evans Lance, MD;  Location: [place] [facility];  Service: Cardiovascular;  Laterality: N/A;  . Ep implantable device N/A 11/07/2015    Procedure: Pocket Revision;  Surgeon: Evans Lance, MD;  Location: [place] [facility];  Service: Cardiovascular;  Laterality: N/A;   Family History  Problem Relation Age of Onset  . Diabetes Other    Social History   Substance Use Topics  . Smoking status: Current Every Day Smoker -- 2.50 packs/day    Types: Cigarettes  . Smokeless tobacco: None  . Alcohol Use: 12.6 oz/week    21 Shots of liquor per week     Comment: nightly    Review of Systems  Constitutional: Negative for fever.  HENT: Negative for congestion.   Eyes: Negative for visual disturbance.  Respiratory: Positive for shortness of breath and wheezing.   Cardiovascular: Negative for chest pain.  Gastrointestinal: Negative for nausea, vomiting, abdominal pain and diarrhea.  Genitourinary: Negative for dysuria.  Musculoskeletal: Negative for back pain.  Skin: Negative for rash.  Neurological: Negative for headaches.  Hematological: Does not bruise/bleed easily.  Psychiatric/Behavioral: Negative for confusion.      Allergies  Review of patient's allergies indicates no known allergies.  Home Medications   Prior to Admission medications   Medication Sig Start Date End Date Taking? Authorizing Provider  azithromycin (ZITHROMAX) 250 MG tablet Take 1 tablet (250 mg total) by mouth daily. 03/28/16  Yes Kathie Dike, MD  carvedilol (COREG) 25 MG tablet Take 25 mg by mouth 2 (two) times daily with a meal.  01/23/16  Yes Historical Provider, MD  guaiFENesin (MUCINEX) 600 MG 12 hr tablet Take 1 tablet (600 mg total) by mouth 2 (two) times daily. 03/28/16  Yes Kathie Dike, MD  hydrALAZINE (APRESOLINE) 100 MG tablet Take 50 mg by mouth 3 (three) times daily.  01/23/16  Yes Historical Provider, MD  lisinopril (PRINIVIL,ZESTRIL)  40 MG tablet Take 40 mg by mouth daily.   Yes Historical Provider, MD  LORazepam (ATIVAN) 2 MG tablet Take 1 tablet by mouth every 6 (six) hours as needed for anxiety.  10/01/15  Yes Historical Provider, MD  nicotine (NICODERM CQ - DOSED IN MG/24 HOURS) 21 mg/24hr patch Place 1 patch (21 mg total) onto the skin daily. 03/28/16  Yes Kathie Dike, MD  oxyCODONE-acetaminophen (PERCOCET/ROXICET) 5-325 MG tablet Take 1-2  tablets by mouth every 6 (six) hours as needed. Patient taking differently: Take 1-2 tablets by mouth every 6 (six) hours as needed for moderate pain.  01/20/16  Yes Kristen N Ward, DO  potassium chloride SA (K-DUR,KLOR-CON) 20 MEQ tablet Take 1 tablet by mouth daily. 10/14/15  Yes Historical Provider, MD  predniSONE (DELTASONE) 10 MG tablet Take '40mg'$  po daily for 2 days then '30mg'$  daily for 2 days then '20mg'$  daily for 2 days then '10mg'$  daily for 2 days then stop 03/28/16  Yes Kathie Dike, MD  PROAIR HFA 108 [phone] Base) MCG/ACT inhaler Inhale 2 puffs into the lungs every 4 (four) hours as needed for wheezing or shortness of breath. 03/28/16  Yes Kathie Dike, MD  rOPINIRole (REQUIP) 0.25 MG tablet Take 1-2 tablets by mouth at bedtime as needed (restless leg).  02/25/16  Yes Historical Provider, MD   BP 173/102 mmHg  Pulse 76  Temp(Src) 96.7 F (35.9 C) (Oral)  Resp 27  Ht '5\' 10"'$  (1.778 m)  Wt 85.73 kg  BMI 27.12 kg/m2  SpO2 94% Physical Exam  Constitutional: He is oriented to person, place, and time. He appears well-developed and well-nourished. No distress.  HENT:  Head: Normocephalic and atraumatic.  Mouth/Throat: Oropharynx is clear and moist.  Cardiovascular: Normal rate, regular rhythm and normal heart sounds.   No murmur heard. Pulmonary/Chest: Effort normal. No respiratory distress. He has no wheezes. He has no rales.  Abdominal: Soft. Bowel sounds are normal. There is no tenderness.  Musculoskeletal: Normal range of motion. He exhibits edema.   trace leg edema. Bilaterally.  Neurological: He is alert and oriented to person, place, and time. No cranial nerve deficit. He exhibits normal muscle tone. Coordination normal.  Skin: Skin is warm. No rash noted.  Nursing note and vitals reviewed.   ED Course  Procedures (including critical care time) Labs Review Labs Reviewed  COMPREHENSIVE METABOLIC PANEL - Abnormal; Notable for the following:    Chloride 99 (*)    Glucose, Bld 117  (*)    AST 132 (*)    ALT 105 (*)    Total Bilirubin 1.5 (*)    All other components within normal limits  CBC WITH DIFFERENTIAL/PLATELET - Abnormal; Notable for the following:    WBC 19.6 (*)    Neutro Abs 17.4 (*)    All other components within normal limits  BRAIN NATRIURETIC PEPTIDE - Abnormal; Notable for the following:    B Natriuretic Peptide 488.0 (*)    All other components within normal limits  D-DIMER, QUANTITATIVE (NOT AT [facility]) - Abnormal; Notable for the following:    D-Dimer, Quant 1.19 (*)    All other components within normal limits  I-STAT TROPOININ, ED - Abnormal; Notable for the following:    Troponin i, poc 0.17 (*)    All other components within normal limits   Results for orders placed or performed during the hospital encounter of 03/29/16  Comprehensive metabolic panel  Result Value Ref Range   Sodium 136 135 - 145 mmol/L  Potassium 3.8 3.5 - 5.1 mmol/L   Chloride 99 (L) 101 - 111 mmol/L   CO2 27 22 - 32 mmol/L   Glucose, Bld 117 (H) 65 - 99 mg/dL   BUN 20 6 - 20 mg/dL   Creatinine, Ser 0.76 0.61 - 1.24 mg/dL   Calcium 9.1 8.9 - 10.3 mg/dL   Total Protein 7.6 6.5 - 8.1 g/dL   Albumin 3.8 3.5 - 5.0 g/dL   AST 132 (H) 15 - 41 U/L   ALT 105 (H) 17 - 63 U/L   Alkaline Phosphatase 73 38 - 126 U/L   Total Bilirubin 1.5 (H) 0.3 - 1.2 mg/dL   GFR calc non Af Amer >60 >60 mL/min   GFR calc Af Amer >60 >60 mL/min   Anion gap 10 5 - 15  CBC with Differential/Platelet  Result Value Ref Range   WBC 19.6 (H) 4.0 - 10.5 K/uL   RBC 4.22 4.22 - 5.81 MIL/uL   Hemoglobin 13.3 13.0 - 17.0 g/dL   HCT 40.2 39.0 - 52.0 %   MCV 95.3 78.0 - 100.0 fL   MCH 31.5 26.0 - 34.0 pg   MCHC 33.1 30.0 - 36.0 g/dL   RDW 14.6 11.5 - 15.5 %   Platelets 269 150 - 400 K/uL   Neutrophils Relative % 89 %   Neutro Abs 17.4 (H) 1.7 - 7.7 K/uL   Lymphocytes Relative 7 %   Lymphs Abs 1.4 0.7 - 4.0 K/uL   Monocytes Relative 4 %   Monocytes Absolute 0.8 0.1 - 1.0 K/uL   Eosinophils  Relative 0 %   Eosinophils Absolute 0.0 0.0 - 0.7 K/uL   Basophils Relative 0 %   Basophils Absolute 0.0 0.0 - 0.1 K/uL   WBC Morphology ATYPICAL LYMPHOCYTES   Brain natriuretic peptide  Result Value Ref Range   B Natriuretic Peptide 488.0 (H) 0.0 - 100.0 pg/mL  D-dimer, quantitative (not at [facility])  Result Value Ref Range   D-Dimer, Quant 1.19 (H) 0.00 - 0.50 ug/mL-FEU  I-Stat Troponin, ED (not at [facility])  Result Value Ref Range   Troponin i, poc 0.17 (HH) 0.00 - 0.08 ng/mL   Comment NOTIFIED PHYSICIAN    Comment 3             Imaging Review Dg Chest 2 View  03/29/2016  CLINICAL DATA:  Shortness of breath.  COPD exacerbation. EXAM: CHEST  2 VIEW COMPARISON:  03/28/2016 FINDINGS: mild hyperinflation. moderate thoracic spondylosis. Prominent upper thoracic osteophytes, causing increased density over the spine on the lateral view. Pacer with leads at right atrium and right ventricle. No lead discontinuity. Midline trachea. Mild cardiomegaly with transverse aortic atherosclerosis. Trace bilateral pleural effusions. Mild pulmonary venous congestion. No lobar consolidation. No pneumothorax. IMPRESSION: Cardiomegaly and mild pulmonary venous congestion. Probable trace bilateral pleural effusions. Electronically Signed   By: Abigail Miyamoto M.D.   On: 03/29/2016 08:17   Dg Chest 2 View  03/28/2016  CLINICAL DATA:  Shortness of breath EXAM: CHEST  2 VIEW COMPARISON:  03/27/2016 FINDINGS: Lungs are clear. Small bilateral pleural effusions. No pneumothorax. Stable cardiomegaly.  Left subclavian pacemaker. Degenerative changes of the visualized thoracolumbar spine. IMPRESSION: Small bilateral pleural effusions. Electronically Signed   By: Julian Hy M.D.   On: 03/28/2016 11:11   Ct Angio Chest Pe W/cm &/or Wo Cm  03/29/2016  CLINICAL DATA:  [facility] called out for sob. Pt was discharged yesterday from here on April 22 nd for COPD exacerbation. Pt reports feeling sob  since being discharged but pt got  worse during the night tonight EXAM: CT ANGIOGRAPHY CHEST WITH CONTRAST TECHNIQUE: Multidetector CT imaging of the chest was performed using the standard protocol during bolus administration of intravenous contrast. Multiplanar CT image reconstructions and MIPs were obtained to evaluate the vascular anatomy. CONTRAST:  158m OMNIPAQUE IOHEXOL 350 MG/ML SOLN COMPARISON:  03/14/2008 FINDINGS: Vascular: Left subclavian pacemaker leads extend to the right atrium and right ventricular apex. Right arm IV contrast injection. The SVC is patent. Mild right atrial enlargement. The right ventricle is nondilated. Mild dilatation of central pulmonary arteries. Patent bilateral pulmonary veins drain into the left atrium. Scattered coronary calcifications. Adequate contrast opacification of the thoracic aorta with no evidence of dissection, aneurysm, or stenosis. There is classic 3-vessel brachiocephalic arch anatomy without proximal stenosis. Scattered calcifications in the arch and descending thoracic segment. Visualized suprarenal abdominal aorta shows minimal atheromatous plaque. Mediastinum/Lymph Nodes: No masses or pathologically enlarged lymph nodes identified. Moderate pericardial effusion. Lungs/Pleura: Bilateral small pleural effusions. Pulmonary emphysema. 17 mm spiculated nodule in posterior segment right upper lobe image 37/5. Subsegmental dependent atelectasis posteriorly in both lower lobes. Upper abdomen: No acute findings. Musculoskeletal: Flowing osteophytes across multiple contiguous levels in the mid and lower thoracic spine. Sternum intact. Review of the MIP images confirms the above findings. IMPRESSION: 1. Negative for acute PE or thoracic aortic dissection. 2. Pericardial and bilateral pleural effusions. 3. 17 mm spiculated posterior right upper lobe nodule suggesting primary bronchogenic carcinoma. Consider thoracic surgical consultation. 4. Atherosclerosis, including aortic and coronary artery disease.  Please note that although the presence of coronary artery calcium documents the presence of coronary artery disease, the severity of this disease and any potential stenosis cannot be assessed on this non-gated CT examination. Assessment for potential risk factor modification, dietary therapy or pharmacologic therapy may be warranted, if clinically indicated. Electronically Signed   By: DLucrezia EuropeM.D.   On: 03/29/2016 13:32   I have personally reviewed and evaluated these images and lab results as part of my medical decision-making.   EKG Interpretation   Date/Time:  Monday March 29 2016 07:12:45 EDT Ventricular Rate:  61 PR Interval:    QRS Duration: 179 QT Interval:  484 QTC Calculation: 488 R Axis:   -86 Text Interpretation:  Afib/flutter and ventricular-paced rhythm No further  analysis attempted due to paced rhythm Confirmed by Sabel Hornbeck  MD, Sondra Blixt  ([id number] on 03/29/2016 7:18:45 AM          CRITICAL CARE Performed by: ZFredia SorrowTotal critical care time: 30 minutes Critical care time was exclusive of separately billable procedures and treating other patients. Critical care was necessary to treat or prevent imminent or life-threatening deterioration. Critical care was time spent personally by me on the following activities: development of treatment plan with patient and/or surrogate as well as nursing, discussions with consultants, evaluation of patient's response to treatment, examination of patient, obtaining history from patient or surrogate, ordering and performing treatments and interventions, ordering and review of laboratory studies, ordering and review of radiographic studies, pulse oximetry and re-evaluation of patient's condition.  MDM   Final diagnoses:  SOB (shortness of breath)  COPD exacerbation (HCC)  Non-STEMI (non-ST elevated myocardial infarction) ([facility]   Patient with extensive workup here arrived by EMS with a report of severe respiratory distress but  was significantly improved upon arrival. Patient was treated with 3 nebulizer treatments inside Medrol prior arrival. Patient arrived with oxygen saturation is 95% or better on 2 L. Patient was anxious  but however had no wheezing.   Chest x-ray had no acute findings other than trace bilateral pleural effusions. Patient's oxygen saturations on room air were 95% however patient kept feeling short of breath so he was returned to the 2 L which gave him comfort. Patient Stating that he couldn't breathe well and that he needed admission. Patient was just discharged from the hospital yesterday stated he felt fine when he went home. Patient had negative d-dimer and negative troponins 1 in the hospital. Patient stated he had chest pain 2 days ago but has had none since and no chest discomfort at all.  Workup was expanded. Patient's EKG shows pacemaker no obvious changes compared to old other than some subtle changes inferiorly. Patient's troponin came back elevated at 0.17 and patient also had an elevated d-dimer. CT angiogram ruled out pulmonary embolus however raise concerns for possible bronchogenic carcinoma which would not explain his symptoms. Also showed pericardial and pleural effusions. Little bit of trace pulmonary edema.  Discussed with hospitalist here they will admit him here for a rule out will give Lasix patient given aspirin and patient will be started on heparin.  Still seems as if the initial complaint was COPD exacerbation which has happened to the patient in the past could've been an anxiety component on top of it. Lungs have remained clear.    Fredia Sorrow, MD 03/29/16 [phone]

## 2016-03-30 ENCOUNTER — Inpatient Hospital Stay (HOSPITAL_COMMUNITY): Payer: BLUE CROSS/BLUE SHIELD

## 2016-03-30 DIAGNOSIS — Z950 Presence of cardiac pacemaker: Secondary | ICD-10-CM

## 2016-03-30 DIAGNOSIS — I509 Heart failure, unspecified: Secondary | ICD-10-CM

## 2016-03-30 DIAGNOSIS — I4892 Unspecified atrial flutter: Secondary | ICD-10-CM

## 2016-03-30 DIAGNOSIS — I248 Other forms of acute ischemic heart disease: Secondary | ICD-10-CM

## 2016-03-30 DIAGNOSIS — I5041 Acute combined systolic (congestive) and diastolic (congestive) heart failure: Secondary | ICD-10-CM

## 2016-03-30 DIAGNOSIS — I10 Essential (primary) hypertension: Secondary | ICD-10-CM

## 2016-03-30 DIAGNOSIS — R911 Solitary pulmonary nodule: Secondary | ICD-10-CM

## 2016-03-30 LAB — PROTIME-INR
INR: 1.16 (ref 0.00–1.49)
PROTHROMBIN TIME: 14.9 s (ref 11.6–15.2)

## 2016-03-30 LAB — BASIC METABOLIC PANEL
ANION GAP: 8 (ref 5–15)
BUN: 30 mg/dL — ABNORMAL HIGH (ref 6–20)
CALCIUM: 8.8 mg/dL — AB (ref 8.9–10.3)
CO2: 30 mmol/L (ref 22–32)
Chloride: 98 mmol/L — ABNORMAL LOW (ref 101–111)
Creatinine, Ser: 0.75 mg/dL (ref 0.61–1.24)
Glucose, Bld: 115 mg/dL — ABNORMAL HIGH (ref 65–99)
Potassium: 3.6 mmol/L (ref 3.5–5.1)
SODIUM: 136 mmol/L (ref 135–145)

## 2016-03-30 LAB — ECHOCARDIOGRAM COMPLETE
Height: 70 in
WEIGHTICAEL: 3016 [oz_av]

## 2016-03-30 LAB — CBC
HCT: 34.2 % — ABNORMAL LOW (ref 39.0–52.0)
HEMOGLOBIN: 11.3 g/dL — AB (ref 13.0–17.0)
MCH: 31.5 pg (ref 26.0–34.0)
MCHC: 33 g/dL (ref 30.0–36.0)
MCV: 95.3 fL (ref 78.0–100.0)
PLATELETS: 257 10*3/uL (ref 150–400)
RBC: 3.59 MIL/uL — ABNORMAL LOW (ref 4.22–5.81)
RDW: 14.5 % (ref 11.5–15.5)
WBC: 13.6 10*3/uL — ABNORMAL HIGH (ref 4.0–10.5)

## 2016-03-30 LAB — TROPONIN I
TROPONIN I: 0.47 ng/mL — AB (ref <0.031)
Troponin I: 0.56 ng/mL (ref <0.031)

## 2016-03-30 LAB — HEPARIN LEVEL (UNFRACTIONATED): Heparin Unfractionated: 0.37 [IU]/mL (ref 0.30–0.70)

## 2016-03-30 MED ORDER — SODIUM CHLORIDE 0.9 % IV SOLN
250.0000 mL | INTRAVENOUS | Status: DC | PRN
Start: 2016-03-30 — End: 2016-03-31

## 2016-03-30 MED ORDER — SODIUM CHLORIDE 0.9% FLUSH: 3.0000 mL | INTRAVENOUS | Status: DC | PRN

## 2016-03-30 MED ORDER — NITROGLYCERIN 2 % TD OINT
1.0000 [in_us] | TOPICAL_OINTMENT | Freq: Four times a day (QID) | TRANSDERMAL | Status: DC
  Administered 2016-03-30 – 2016-04-01 (×9): 1 [in_us] via TOPICAL
  Filled 2016-03-30 (×13): qty 30
  Filled 2016-03-30: qty 1
  Filled 2016-03-30 (×5): qty 30
  Filled 2016-03-30: qty 1
  Filled 2016-03-30 (×11): qty 30

## 2016-03-30 MED ORDER — BISACODYL 5 MG PO TBEC
5.0000 mg | DELAYED_RELEASE_TABLET | Freq: Every day | ORAL | Status: DC | PRN
  Administered 2016-03-30: 5 mg via ORAL
  Filled 2016-03-30: qty 1

## 2016-03-30 MED ORDER — HEPARIN (PORCINE) IN NACL 100-0.45 UNIT/ML-% IJ SOLN
1350.0000 [IU]/h | INTRAMUSCULAR | Status: DC
  Administered 2016-03-30: 1350 [IU]/h via INTRAVENOUS
  Filled 2016-03-30: qty 250

## 2016-03-30 MED ORDER — SODIUM CHLORIDE 0.9 % IV SOLN
INTRAVENOUS | Status: DC
Start: 2016-03-31 — End: 2016-03-31
  Administered 2016-03-31: 07:00:00 via INTRAVENOUS

## 2016-03-30 MED ORDER — ATORVASTATIN CALCIUM 40 MG PO TABS
40.0000 mg | ORAL_TABLET | Freq: Every day | ORAL | Status: DC
  Administered 2016-03-30 – 2016-04-01 (×3): 40 mg via ORAL
  Filled 2016-03-30 (×3): qty 1

## 2016-03-30 MED ORDER — ASPIRIN 81 MG PO CHEW
81.0000 mg | CHEWABLE_TABLET | ORAL | Status: AC
  Administered 2016-03-31: 81 mg via ORAL
  Filled 2016-03-30: qty 1

## 2016-03-30 MED ORDER — CLOPIDOGREL BISULFATE 75 MG PO TABS: 75.0000 mg | ORAL_TABLET | Freq: Every day | ORAL | Status: DC

## 2016-03-30 MED ORDER — SODIUM CHLORIDE 0.9% FLUSH
3.0000 mL | Freq: Two times a day (BID) | INTRAVENOUS | Status: DC
  Administered 2016-03-30 – 2016-03-31 (×2): 3 mL via INTRAVENOUS

## 2016-03-30 NOTE — Care Management Note (Signed)
Case Management Note  Patient Details  Name: ROWEN WILMER MRN: [id number] Date of Birth: 04/22/1951  Subjective/Objective:                  Pt admitted with CHF. Pt is from home, lives with wife and is ind with ADL's. Pt has PCP, cardiologist and drives himself to appointments. Pt has no difficulty affording medications. Pt does not have neb or home O2 PTA. Pt plans to return home with self care at DC.   Action/Plan: No CM needs anticipated.   Expected Discharge Date:    04/01/2016              Expected Discharge Plan:  Home/Self Care  In-House Referral:  NA  Discharge planning Services  CM Consult  Post Acute Care Choice:  NA Choice offered to:  NA  DME Arranged:    DME Agency:     HH Arranged:    HH Agency:     Status of Service:  In process, will continue to follow  Medicare Important Message Given:    Date Medicare IM Given:    Medicare IM give by:    Date Additional Medicare IM Given:    Additional Medicare Important Message give by:     If discussed at [place] of Stay Meetings, dates discussed:    Additional Comments:  Sherald Barge, RN 03/30/2016, 9:25 AM

## 2016-03-30 NOTE — Progress Notes (Addendum)
[place] for HEPARIN Indication: chest pain/ACS  No Known Allergies  Patient Measurements: Height: '5\' 10"'$  (177.8 cm) Weight: 188 lb 8 oz (85.503 kg) IBW/kg (Calculated) : 73 HEPARIN DW (KG): 85.7  Vital Signs: Temp: 97.8 F (36.6 C) (04/25 0500) Temp Source: Oral (04/25 0500) BP: 157/80 mmHg (04/25 0500) Pulse Rate: 71 (04/25 0500)  Labs:  Recent Labs  03/28/16 0619 03/29/16 0807 03/29/16 2020 03/30/16 0149 03/30/16 0539  HGB 11.9* 13.3  --   --  11.3*  HCT 35.3* 40.2  --   --  34.2*  PLT 221 269  --   --  257  HEPARINUNFRC  --   --  <0.10*  --  0.37  CREATININE 0.63 0.76  --   --  0.75  TROPONINI  --   --  0.69* 0.56* 0.47*   Estimated Creatinine Clearance: 96.3 mL/min (by C-G formula based on Cr of 0.75).  Medical History: Past Medical History  Diagnosis Date  . Hypertension   . Hemochromatosis   . Mobitz type 2 second degree heart block     PPM MDT 10/28/15 Dr. Lovena Le   Medications:  Prescriptions prior to admission  Medication Sig Dispense Refill Last Dose  . azithromycin (ZITHROMAX) 250 MG tablet Take 1 tablet (250 mg total) by mouth daily. 4 each 0 unknown  . carvedilol (COREG) 25 MG tablet Take 25 mg by mouth 2 (two) times daily with a meal.   11 03/29/2016 at 1600  . guaiFENesin (MUCINEX) 600 MG 12 hr tablet Take 1 tablet (600 mg total) by mouth 2 (two) times daily. 30 tablet 0 unknown  . hydrALAZINE (APRESOLINE) 100 MG tablet Take 50 mg by mouth 3 (three) times daily.   11 03/29/2016 at Unknown time  . lisinopril (PRINIVIL,ZESTRIL) 40 MG tablet Take 40 mg by mouth daily.   03/29/2016 at Unknown time  . LORazepam (ATIVAN) 2 MG tablet Take 1 tablet by mouth every 6 (six) hours as needed for anxiety.    03/29/2016 at Unknown time  . nicotine (NICODERM CQ - DOSED IN MG/24 HOURS) 21 mg/24hr patch Place 1 patch (21 mg total) onto the skin daily. 28 patch 0 03/29/2016 at Unknown time  . oxyCODONE-acetaminophen  (PERCOCET/ROXICET) 5-325 MG tablet Take 1-2 tablets by mouth every 6 (six) hours as needed. (Patient taking differently: Take 1-2 tablets by mouth every 6 (six) hours as needed for moderate pain. ) 15 tablet 0 03/29/2016 at Unknown time  . potassium chloride SA (K-DUR,KLOR-CON) 20 MEQ tablet Take 1 tablet by mouth daily.   03/28/2016 at Unknown time  . predniSONE (DELTASONE) 10 MG tablet Take '40mg'$  po daily for 2 days then '30mg'$  daily for 2 days then '20mg'$  daily for 2 days then '10mg'$  daily for 2 days then stop 20 tablet 0 03/28/2016 at Unknown time  . PROAIR HFA 108 (90 Base) MCG/ACT inhaler Inhale 2 puffs into the lungs every 4 (four) hours as needed for wheezing or shortness of breath. 1 Inhaler 0 03/28/2016 at Unknown time  . rOPINIRole (REQUIP) 0.25 MG tablet Take 1-2 tablets by mouth at bedtime as needed (restless leg).   1 03/28/2016 at Unknown time    Assessment: 65yo male recently d/c'd from hospital due to COPD exacerbation.  Pt now c/o SOB.  Asked to initiate Heparin for ACS. Heparin level this am is therapeutic.  Goal of Therapy:  Heparin level 0.3-0.7 units/ml Monitor platelets by anticoagulation protocol: Yes   Plan:  Continue heparin at  1350 units/hr Heparin level and CBC daily while on IV Heparin  Caidyn Blossom Pharm D Clinical Pharmacist 03/30/2016,7:39 AM  Addum:  Heparin drip stopped but now to be restarted for ? Infarction LAD.  Will resume previous rate and follow up heparin level.

## 2016-03-30 NOTE — Progress Notes (Addendum)
Report called to [facility].  Transferred via [place] to [facility].

## 2016-03-30 NOTE — Progress Notes (Signed)
MD informed of Troponin level, orders given.

## 2016-03-30 NOTE — Consult Note (Addendum)
CARDIOLOGY CONSULT NOTE   Patient ID: Shaun May MRN: [id number] DOB/AGE: 64-Jun-1952 64 y.o.  Admit Date: 03/29/2016 Referring Physician: TRH-Memon Primary Physician: Wende Neighbors, MD Consulting Cardiologist: Kate Sable MD Primary Cardiologist: [name] Reason for Consultation: Dyspnea   Clinical Summary Mr. Ohm is a 65 y.o.male with known history of complete heart block, status post pacemaker insertion by Dr. Crissie Sickles on 10/28/2015, this is a [employer], hypertension,hemochromotosis, history of COPD, chronic tobacco abuse and ETOH abuse.. The patient was discharged from the hospital on 03/28/2016 after admission for COPD exacerbation, with worsening shortness of breath. He was set to have early pneumonia but this was ruled out with unremarkable chest x-ray and improvement in symptoms with nebulizer treatments. He was started on broad-spectrum antibiotics IV Solu-Medrol and IV fluids. After returning home his symptoms returned that same night,with recurrent and progressive shortness of breath. He had worsening wheezing, along with coughing. He called EMS and returned to the hospital.  On arrival to the emergency room the patient's blood pressure was 187/99 heart rate 65 O2 sat 100% respirations 34 he was afebrile. His troponin was noted to be elevated at 0.17. Other pertinent labs revealed potassium is 3.8 chloride 99 glucose 117. He is noted to have elevated AST at 132 ALT at 105 with an elevated bilirubin at 1.5. White blood cells were elevated at 19.6. BNP 488. CT scan was negative for PE, he did have pericardial and bilateral pleural effusions. He also has speculated posterior right upper lobe nodule suggesting primary bronchogenic carcinoma. Chest x-ray revealed cardiomegaly with mild pulmonary venous congestion. EKG revealed baseline atrial flutter with ventricular pacing. He was treated with IV fluids, Lasix 40 mg IV, aspirin, started on a heparin drip for elevated  troponin, and admitted. We are asked for cardiology recommendations in the setting of elevated troponin. Subsequent troponin since admission, 0.69, 0.56, and 0.47 respectively. He is diuresed 1.8 L.  He is feeling much better on assessment and is complaining of mild constipation only. Breathing status has improved. His appetite is poor. He is currently on a heparin drip. No chest pain. Echo pending.   No Known Allergies  Medications Scheduled Medications: . aspirin EC  81 mg Oral Daily  . carvedilol  25 mg Oral BID WC  . furosemide  40 mg Intravenous BID  . guaiFENesin  600 mg Oral BID  . hydrALAZINE  50 mg Oral Q8H  . lisinopril  40 mg Oral Daily  . nicotine  21 mg Transdermal Daily  . nitroGLYCERIN  1 inch Topical Q6H  . potassium chloride SA  20 mEq Oral Daily  . predniSONE  20 mg Oral Q breakfast  . sodium chloride flush  3 mL Intravenous Q12H    Infusions: . heparin 1,350 Units/hr (03/30/16 0302)    PRN Medications: sodium chloride, acetaminophen, albuterol, LORazepam, ondansetron (ZOFRAN) IV, oxyCODONE-acetaminophen, rOPINIRole, sodium chloride flush   Past Medical History  Diagnosis Date  . Hypertension   . Hemochromatosis   . Mobitz type 2 second degree heart block     PPM MDT 10/28/15 Dr. Lovena Le    Past Surgical History  Procedure Laterality Date  . Ep implantable device N/A 10/28/2015    Procedure: Pacemaker Implant;  Surgeon: Evans Lance, MD;  Location: [facility];  Service: Cardiovascular;  Laterality: N/A;  . Ep implantable device N/A 11/07/2015    Procedure: Pocket Revision;  Surgeon: Evans Lance, MD;  Location: [facility];  Service: Cardiovascular;  Laterality: N/A;    Family History  Problem Relation Age of Onset  . Diabetes Other     Social History Mr. Siler reports that he has been smoking Cigarettes.  He has been smoking about 2.50 packs per day. He does not have any smokeless tobacco history on file. Mr. Walls reports that  he drinks about 12.6 oz of alcohol per week.  Review of Systems Complete review of systems are found to be negative unless outlined in H&P above.  Physical Examination Blood pressure 157/80, pulse 71, temperature 97.8 F (36.6 C), temperature source Oral, resp. rate 22, height '5\' 10"'$  (1.778 m), weight 188 lb 8 oz (85.503 kg), SpO2 95 %.  Intake/Output Summary (Last 24 hours) at 03/30/16 0808 Last data filed at 03/29/16 2300  Gross per 24 hour  Intake    240 ml  Output   1800 ml  Net  -1560 ml    Telemetry: Atrial flutter with paced rhythm.   GEN: No acute distress HEENT: Conjunctiva and lids normal, oropharynx clear with moist mucosa. Neck: Supple, no elevated JVP or carotid bruits, no thyromegaly. Lungs: Bibasilar crackles, no wheezing, no coughing. Cardiac: Regular rate and rhythm, no S3 or significant systolic murmur, no pericardial rub. Abdomen: Soft, nontender, no hepatomegaly, bowel sounds present, no guarding or rebound. Extremities: Non pitting edema, distal pulses 2+. Skin: Warm and dry. Musculoskeletal: No kyphosis. Neuropsychiatric: Alert and oriented x3, affect grossly appropriate.  Prior Cardiac Testing/Procedures 1.Echocardiogram Left ventricle: The cavity size was normal. Systolic function was  normal. The estimated ejection fraction was in the range of 55%  to 60%. Wall motion was normal; there were no regional wall  motion abnormalities. Doppler parameters are consistent with  abnormal left ventricular relaxation (grade 1 diastolic  dysfunction). - Mitral valve: Mildly calcified annulus. - Left atrium: The atrium was mildly dilated.  Lab Results  Basic Metabolic Panel:  Recent Labs Lab 03/27/16 0145 03/28/16 0619 03/29/16 0807 03/30/16 0539  NA 131* 135 136 136  K 3.6 4.0 3.8 3.6  CL 96* 100* 99* 98*  CO2 '25 27 27 30  '$ GLUCOSE 153* 138* 117* 115*  BUN 25* 16 20 30*  CREATININE 0.78 0.63 0.76 0.75  CALCIUM 9.1 8.9 9.1 8.8*    Liver  Function Tests:  Recent Labs Lab 03/27/16 0257 03/29/16 0807  AST 46* 132*  ALT 38 105*  ALKPHOS 51 73  BILITOT 1.3* 1.5*  PROT 7.0 7.6  ALBUMIN 3.9 3.8    CBC:  Recent Labs Lab 03/27/16 0145 03/28/16 0619 03/29/16 0807 03/30/16 0539  WBC 17.1* 11.8* 19.6* 13.6*  NEUTROABS  --   --  17.4*  --   HGB 14.0 11.9* 13.3 11.3*  HCT 40.9 35.3* 40.2 34.2*  MCV 94.0 94.4 95.3 95.3  PLT 270 221 269 257    Cardiac Enzymes:  Recent Labs Lab 03/27/16 1259 03/27/16 1926 03/29/16 2020 03/30/16 0149 03/30/16 0539  TROPONINI <0.03 <0.03 0.69* 0.56* 0.47*    Radiology: Dg Chest 2 View  03/29/2016  CLINICAL DATA:  Shortness of breath.  COPD exacerbation. EXAM: CHEST  2 VIEW COMPARISON:  03/28/2016 FINDINGS: mild hyperinflation. moderate thoracic spondylosis. Prominent upper thoracic osteophytes, causing increased density over the spine on the lateral view. Pacer with leads at right atrium and right ventricle. No lead discontinuity. Midline trachea. Mild cardiomegaly with transverse aortic atherosclerosis. Trace bilateral pleural effusions. Mild pulmonary venous congestion. No lobar consolidation. No pneumothorax. IMPRESSION: Cardiomegaly and mild pulmonary venous congestion. Probable trace bilateral pleural effusions.  Electronically Signed   By: Abigail Miyamoto M.D.   On: 03/29/2016 08:17   Dg Chest 2 View  03/28/2016  CLINICAL DATA:  Shortness of breath EXAM: CHEST  2 VIEW COMPARISON:  03/27/2016 FINDINGS: Lungs are clear. Small bilateral pleural effusions. No pneumothorax. Stable cardiomegaly.  Left subclavian pacemaker. Degenerative changes of the visualized thoracolumbar spine. IMPRESSION: Small bilateral pleural effusions. Electronically Signed   By: Julian Hy M.D.   On: 03/28/2016 11:11   Ct Angio Chest Pe W/cm &/or Wo Cm  03/29/2016  CLINICAL DATA:  [facility] called out for sob. Pt was discharged yesterday from here on April 22 nd for COPD exacerbation. Pt reports feeling sob  since being discharged but pt got worse during the night tonight EXAM: CT ANGIOGRAPHY CHEST WITH CONTRAST TECHNIQUE: Multidetector CT imaging of the chest was performed using the standard protocol during bolus administration of intravenous contrast. Multiplanar CT image reconstructions and MIPs were obtained to evaluate the vascular anatomy. CONTRAST:  136m OMNIPAQUE IOHEXOL 350 MG/ML SOLN COMPARISON:  03/14/2008 FINDINGS: Vascular: Left subclavian pacemaker leads extend to the right atrium and right ventricular apex. Right arm IV contrast injection. The SVC is patent. Mild right atrial enlargement. The right ventricle is nondilated. Mild dilatation of central pulmonary arteries. Patent bilateral pulmonary veins drain into the left atrium. Scattered coronary calcifications. Adequate contrast opacification of the thoracic aorta with no evidence of dissection, aneurysm, or stenosis. There is classic 3-vessel brachiocephalic arch anatomy without proximal stenosis. Scattered calcifications in the arch and descending thoracic segment. Visualized suprarenal abdominal aorta shows minimal atheromatous plaque. Mediastinum/Lymph Nodes: No masses or pathologically enlarged lymph nodes identified. Moderate pericardial effusion. Lungs/Pleura: Bilateral small pleural effusions. Pulmonary emphysema. 17 mm spiculated nodule in posterior segment right upper lobe image 37/5. Subsegmental dependent atelectasis posteriorly in both lower lobes. Upper abdomen: No acute findings. Musculoskeletal: Flowing osteophytes across multiple contiguous levels in the mid and lower thoracic spine. Sternum intact. Review of the MIP images confirms the above findings. IMPRESSION: 1. Negative for acute PE or thoracic aortic dissection. 2. Pericardial and bilateral pleural effusions. 3. 17 mm spiculated posterior right upper lobe nodule suggesting primary bronchogenic carcinoma. Consider thoracic surgical consultation. 4. Atherosclerosis, including  aortic and coronary artery disease. Please note that although the presence of coronary artery calcium documents the presence of coronary artery disease, the severity of this disease and any potential stenosis cannot be assessed on this non-gated CT examination. Assessment for potential risk factor modification, dietary therapy or pharmacologic therapy may be warranted, if clinically indicated. Electronically Signed   By: DLucrezia EuropeM.D.   On: 03/29/2016 13:32     ECG: Baseline atrial flutter with paced rhythm   Impression and Recommendations  1. Elevated troponin: Likely from demand ischemia in the setting of hypoxia, and mild CHF. Repeat echocardiogram is ordered for reevaluation of LV systolic function and comparison to prior echocardiogram completed in November 2016. This will assist in medical management. Would not pursue ischemia workup while hospitalized at this time unless LV systolic function is significantly reduced. Consider stopping heparin.  2.Atrial Flutter: Noted initially on EKG at baseline with paced rhythm. No documented history of this. Follow-up EKG revealed sinus rhythm ventricularly paced. Consider paroxysmal atrial flutter. CHADS VASC Score of 1.  3. Pacemaker in situ: Implantation per Dr. TLovena Le Most recent interrogation on 02/13/2016, found to be working normally. He was noted that he did have conduction and AV search history cyst was turned on. No medication changes on that office visit.  4. Hypertension: Continues elevated but improved since admission. Remains on carvedilol twice a day, on hydralazine 50 mg twice a day and lisinopril 40 mg daily. May need to increase hydralazine dose was completely diuresed if blood pressure remains elevated. We'll await echocardiogram before adjusting meds.  5. Acute Mixed CHF: He is diuresed approximately 1.8 L since admission, symptoms have improved, there is no evidence of lower extremity edema currently.Chest x-ray revealed mild  pulmonary venous congestion.  6. COPD: Patient is on nebulizer treatments. It is noted that his CT scan was abnormal with a 17 mm spiculated posterior right upper lobe nodule suggesting primary bronchogenic carcinoma. PCP recommends outpatient follow-up with oncology.   Signed: Phill Myron. Lawrence NP Rob[place]  03/30/2016, 8:08 AM   The patient was seen and examined, and I agree with the history, physical exam, assessment and plan as documented above which has been discussed with Arnold Long NP, with modifications as noted below. At the time of my exam, patient denies chest pain and says breathing has improved considerably. Has diuresed over 1.6 L. ECG shows ventricular paced rhythm with underlying atrial flutter. Troponins mildly elevated but trending down. CT angio showed coronary artery calcifications and RUL nodule suspicious for bronchogenic carcinoma. Will review echocardiogram results to assess for interval changes in LV function. I will stop heparin. Will not start anticoagulation as pt may need biopsy in near future.  Kate Sable, MD, [facility]  03/30/2016 9:38 AM   Co-Sign MD  ADDENDUM: Left ventricular systolic function has declined, EF 35-40%. He may have infarcted his LAD based on regional wall motion abnormalities. With downtrending troponins, will add statin to his regimen and resume IV heparin. Already on ASA, Coreg, and ACEI. Given that the LAD may be involved and may require revascularization, I will transfer to [facility] for coronary angiography.

## 2016-03-30 NOTE — Progress Notes (Signed)
PROGRESS NOTE    Shaun May  OIZ:[id number] DOB: 1951/05/29 DOA: 03/29/2016 PCP: Wende Neighbors, MD  Outpatient Specialists:   Brief Narrative: 60 yom with a hx of second-degree heart block s/p pacemaker, HTN, chronic tobacco use, and probably COPD. He was discharged from the hospital 4/23 after being treated with steroids, abx, and nebs for COPD exacerbation and bronchitis. Upon discharge, his SOB had improved and he was breathing comfortably on RA. Upon returning home, he developed SOB on exertion and orthopnea. He called EMS in the morning when he noticed wheezing and a cough. While in the ED, workup showed evidence of pleural effusion as well as a pericardial effusion. CT chest was negative for PE. Troponin mildly elevated. He was referred for admission.   Assessment & Plan:   Active Problems:   Essential hypertension   SOB (shortness of breath)   Acute CHF (congestive heart failure) (HCC)   COPD (chronic obstructive pulmonary disease) (HCC)   Elevated troponin   Restless leg syndrome   Pulmonary nodule, right   1. Acute CHF. Patient noted to have significant pleural effusion and pericardial effusion. He does have other evidence of volume overload including lower extremity edema. BNP is mildly elevated. Continue intravenous Lasix. He is already on an ACE inhibitor and beta blocker. ECHO ordered, will follow up.  2. Elevated troponin. In the ER, he was felt to have a possible non-STEMI and he was started on heparin infusion. This may also be demand ischemia in the setting of congestive heart failure. Troponin trending down. Cardiology has evaluated the patient and recommended discontinuing heparin.  3. COPD. No evidence of wheezing at this time. Continue prednisone taper that was prescribed on last discharge. Continue nebulizer treatments.  4. Incidental finding of right upper lobe pulmonary nodule. Nodule spiculated concerning for primary bronchogenic carcinoma. He will need further  evaluation with oncology/cardiothoracic surgery as an outpatient.  5. Hypertension. Continue outpatient regimen.   DVT prophylaxis: Heparin Code Status: Full Family Communication: Discussed with wife at bedside Disposition Plan: Discharge home in 1-2 days   Consultants:   Cardiology  Procedures:   ECHO 4/25 pending  Antimicrobials:    none   Subjective: Feels improved today. Has been urinating well. Lower extremity edema decreases when he keeps his leg propped up.  Objective: Filed Vitals:   03/29/16 1530 03/29/16 1553 03/29/16 2100 03/30/16 0500  BP: 179/108 176/99 133/79 157/80  Pulse: 78 86 71 71  Temp:  98 F (36.7 C) 98.2 F (36.8 C) 97.8 F (36.6 C)  TempSrc:  Oral Oral Oral  Resp: '24 24 24 22  '$ Height:      Weight:    85.503 kg (188 lb 8 oz)  SpO2: 99% 96% 95% 95%    Intake/Output Summary (Last 24 hours) at 03/30/16 0834 Last data filed at 03/30/16 0800  Gross per 24 hour  Intake    480 ml  Output   2100 ml  Net  -1620 ml   Filed Weights   03/29/16 0651 03/30/16 0500  Weight: 85.73 kg (189 lb) 85.503 kg (188 lb 8 oz)    Examination: General exam: Appears calm and comfortable  Respiratory system: Clear to auscultation. Respiratory effort normal. Cardiovascular system: S1 & S2 heard, RRR. No JVD, murmurs, rubs, gallops or clicks. 1+ edema bilaterally Gastrointestinal system: Abdomen is nondistended, soft and nontender. No organomegaly or masses felt. Normal bowel sounds heard. Central nervous system: Alert and oriented. No focal neurological deficits. Extremities: Symmetric 5 x 5 power.  Skin: No rashes, lesions or ulcers Psychiatry: Judgement and insight appear normal. Mood & affect appropriate.     Data Reviewed: I have personally reviewed following labs and imaging studies  CBC:  Recent Labs Lab 03/27/16 0145 03/28/16 0619 03/29/16 0807 03/30/16 0539  WBC 17.1* 11.8* 19.6* 13.6*  NEUTROABS  --   --  17.4*  --   HGB 14.0 11.9* 13.3  11.3*  HCT 40.9 35.3* 40.2 34.2*  MCV 94.0 94.4 95.3 95.3  PLT 270 221 269 967   Basic Metabolic Panel:  Recent Labs Lab 03/27/16 0145 03/28/16 0619 03/29/16 0807 03/30/16 0539  NA 131* 135 136 136  K 3.6 4.0 3.8 3.6  CL 96* 100* 99* 98*  CO2 '25 27 27 30  '$ GLUCOSE 153* 138* 117* 115*  BUN 25* 16 20 30*  CREATININE 0.78 0.63 0.76 0.75  CALCIUM 9.1 8.9 9.1 8.8*   GFR: Estimated Creatinine Clearance: 96.3 mL/min (by C-G formula based on Cr of 0.75). Liver Function Tests:  Recent Labs Lab 03/27/16 0257 03/29/16 0807  AST 46* 132*  ALT 38 105*  ALKPHOS 51 73  BILITOT 1.3* 1.5*  PROT 7.0 7.6  ALBUMIN 3.9 3.8   No results for input(s): LIPASE, AMYLASE in the last 168 hours. No results for input(s): AMMONIA in the last 168 hours. Coagulation Profile: No results for input(s): INR, PROTIME in the last 168 hours. Cardiac Enzymes:  Recent Labs Lab 03/27/16 1259 03/27/16 1926 03/29/16 2020 03/30/16 0149 03/30/16 0539  TROPONINI <0.03 <0.03 0.69* 0.56* 0.47*   BNP (last 3 results) No results for input(s): PROBNP in the last 8760 hours. HbA1C: No results for input(s): HGBA1C in the last 72 hours. CBG:  Recent Labs Lab 03/27/16 0811  GLUCAP 146*   Lipid Profile: No results for input(s): CHOL, HDL, LDLCALC, TRIG, CHOLHDL, LDLDIRECT in the last 72 hours. Thyroid Function Tests:  Recent Labs  03/29/16 0807  TSH 0.514   Anemia Panel: No results for input(s): VITAMINB12, FOLATE, FERRITIN, TIBC, IRON, RETICCTPCT in the last 72 hours. Urine analysis:    Component Value Date/Time   COLORURINE AMBER* 03/27/2016 0836   APPEARANCEUR HAZY* 03/27/2016 0836   LABSPEC 1.025 03/27/2016 0836   PHURINE 6.0 03/27/2016 0836   GLUCOSEU 100* 03/27/2016 0836   HGBUR NEGATIVE 03/27/2016 0836   BILIRUBINUR NEGATIVE 03/27/2016 0836   KETONESUR NEGATIVE 03/27/2016 0836   PROTEINUR 30* 03/27/2016 0836   NITRITE NEGATIVE 03/27/2016 0836   LEUKOCYTESUR NEGATIVE 03/27/2016 0836    Sepsis Labs: '@LABRCNTIP'$ (procalcitonin:4,lacticidven:4)  ) Recent Results (from the past 240 hour(s))  Blood Culture (routine x 2)     Status: None (Preliminary result)   Collection Time: 03/27/16  2:57 AM  Result Value Ref Range Status   Specimen Description LEFT ANTECUBITAL  Final   Special Requests BOTTLES DRAWN AEROBIC ONLY 8CC  Final   Culture NO GROWTH 2 DAYS  Final   Report Status PENDING  Incomplete  Blood Culture (routine x 2)     Status: None (Preliminary result)   Collection Time: 03/27/16  3:10 AM  Result Value Ref Range Status   Specimen Description BLOOD RIGHT ARM  Final   Special Requests   Final    BOTTLES DRAWN AEROBIC AND ANAEROBIC 6CC DRAWN BY RN   Culture NO GROWTH 2 DAYS  Final   Report Status PENDING  Incomplete  Culture, Urine     Status: Abnormal   Collection Time: 03/27/16  8:36 AM  Result Value Ref Range Status   Specimen  Description URINE, CLEAN CATCH  Final   Special Requests NONE  Final   Culture (A)  Final    1,000 COLONIES/mL INSIGNIFICANT GROWTH Performed at [facility]    Report Status 03/29/2016 FINAL  Final         Radiology Studies: Dg Chest 2 View  03/29/2016  CLINICAL DATA:  Shortness of breath.  COPD exacerbation. EXAM: CHEST  2 VIEW COMPARISON:  03/28/2016 FINDINGS: mild hyperinflation. moderate thoracic spondylosis. Prominent upper thoracic osteophytes, causing increased density over the spine on the lateral view. Pacer with leads at right atrium and right ventricle. No lead discontinuity. Midline trachea. Mild cardiomegaly with transverse aortic atherosclerosis. Trace bilateral pleural effusions. Mild pulmonary venous congestion. No lobar consolidation. No pneumothorax. IMPRESSION: Cardiomegaly and mild pulmonary venous congestion. Probable trace bilateral pleural effusions. Electronically Signed   By: Abigail Miyamoto M.D.   On: 03/29/2016 08:17   Dg Chest 2 View  03/28/2016  CLINICAL DATA:  Shortness of breath EXAM: CHEST  2  VIEW COMPARISON:  03/27/2016 FINDINGS: Lungs are clear. Small bilateral pleural effusions. No pneumothorax. Stable cardiomegaly.  Left subclavian pacemaker. Degenerative changes of the visualized thoracolumbar spine. IMPRESSION: Small bilateral pleural effusions. Electronically Signed   By: Julian Hy M.D.   On: 03/28/2016 11:11   Ct Angio Chest Pe W/cm &/or Wo Cm  03/29/2016  CLINICAL DATA:  [facility] called out for sob. Pt was discharged yesterday from here on April 22 nd for COPD exacerbation. Pt reports feeling sob since being discharged but pt got worse during the night tonight EXAM: CT ANGIOGRAPHY CHEST WITH CONTRAST TECHNIQUE: Multidetector CT imaging of the chest was performed using the standard protocol during bolus administration of intravenous contrast. Multiplanar CT image reconstructions and MIPs were obtained to evaluate the vascular anatomy. CONTRAST:  159m OMNIPAQUE IOHEXOL 350 MG/ML SOLN COMPARISON:  03/14/2008 FINDINGS: Vascular: Left subclavian pacemaker leads extend to the right atrium and right ventricular apex. Right arm IV contrast injection. The SVC is patent. Mild right atrial enlargement. The right ventricle is nondilated. Mild dilatation of central pulmonary arteries. Patent bilateral pulmonary veins drain into the left atrium. Scattered coronary calcifications. Adequate contrast opacification of the thoracic aorta with no evidence of dissection, aneurysm, or stenosis. There is classic 3-vessel brachiocephalic arch anatomy without proximal stenosis. Scattered calcifications in the arch and descending thoracic segment. Visualized suprarenal abdominal aorta shows minimal atheromatous plaque. Mediastinum/Lymph Nodes: No masses or pathologically enlarged lymph nodes identified. Moderate pericardial effusion. Lungs/Pleura: Bilateral small pleural effusions. Pulmonary emphysema. 17 mm spiculated nodule in posterior segment right upper lobe image 37/5. Subsegmental dependent atelectasis  posteriorly in both lower lobes. Upper abdomen: No acute findings. Musculoskeletal: Flowing osteophytes across multiple contiguous levels in the mid and lower thoracic spine. Sternum intact. Review of the MIP images confirms the above findings. IMPRESSION: 1. Negative for acute PE or thoracic aortic dissection. 2. Pericardial and bilateral pleural effusions. 3. 17 mm spiculated posterior right upper lobe nodule suggesting primary bronchogenic carcinoma. Consider thoracic surgical consultation. 4. Atherosclerosis, including aortic and coronary artery disease. Please note that although the presence of coronary artery calcium documents the presence of coronary artery disease, the severity of this disease and any potential stenosis cannot be assessed on this non-gated CT examination. Assessment for potential risk factor modification, dietary therapy or pharmacologic therapy may be warranted, if clinically indicated. Electronically Signed   By: DLucrezia EuropeM.D.   On: 03/29/2016 13:32        Scheduled Meds: . aspirin EC  81 mg Oral Daily  . carvedilol  25 mg Oral BID WC  . furosemide  40 mg Intravenous BID  . guaiFENesin  600 mg Oral BID  . hydrALAZINE  50 mg Oral Q8H  . lisinopril  40 mg Oral Daily  . nicotine  21 mg Transdermal Daily  . nitroGLYCERIN  1 inch Topical Q6H  . potassium chloride SA  20 mEq Oral Daily  . predniSONE  20 mg Oral Q breakfast  . sodium chloride flush  3 mL Intravenous Q12H   Continuous Infusions: . heparin 1,350 Units/hr (03/30/16 0302)     LOS: 1 day    Time spent: 25 minutes    Kathie Dike, MD [facility] Pager [phone]  If 7PM-7AM, please contact night-coverage [email or web address] Password [facility] 03/30/2016, 8:34 AM   By signing my name below, I, Delene Ruffini, attest that this documentation has been prepared under the direction and in the presence of Kathie Dike, MD. Electronically Signed: Delene Ruffini 03/30/2016 12:21pm  I, Dr.  Kathie Dike, personally performed the services described in this documentaiton. All medical record entries made by the scribe were at my direction and in my presence. I have reviewed the chart and agree that the record reflects my personal performance and is accurate and complete  Kathie Dike, MD, 03/30/2016 12:30 PM

## 2016-03-31 ENCOUNTER — Encounter (HOSPITAL_COMMUNITY)
Admission: EM | Disposition: A | Discharge status: Home / Self Care | Payer: Self-pay | Source: Home / Self Care | Attending: Internal Medicine

## 2016-03-31 DIAGNOSIS — I4892 Unspecified atrial flutter: Secondary | ICD-10-CM | POA: Insufficient documentation

## 2016-03-31 DIAGNOSIS — I3139 Other pericardial effusion (noninflammatory): Secondary | ICD-10-CM | POA: Insufficient documentation

## 2016-03-31 DIAGNOSIS — J441 Chronic obstructive pulmonary disease with (acute) exacerbation: Secondary | ICD-10-CM

## 2016-03-31 DIAGNOSIS — I214 Non-ST elevation (NSTEMI) myocardial infarction: Secondary | ICD-10-CM

## 2016-03-31 DIAGNOSIS — I319 Disease of pericardium, unspecified: Secondary | ICD-10-CM

## 2016-03-31 DIAGNOSIS — I509 Heart failure, unspecified: Secondary | ICD-10-CM

## 2016-03-31 DIAGNOSIS — I313 Pericardial effusion (noninflammatory): Secondary | ICD-10-CM | POA: Insufficient documentation

## 2016-03-31 DIAGNOSIS — R0602 Shortness of breath: Secondary | ICD-10-CM

## 2016-03-31 DIAGNOSIS — R7989 Other specified abnormal findings of blood chemistry: Secondary | ICD-10-CM

## 2016-03-31 HISTORY — PX: CARDIAC CATHETERIZATION: SHX172

## 2016-03-31 SURGERY — LEFT HEART CATH AND CORONARY ANGIOGRAPHY
Anesthesia: LOCAL

## 2016-03-31 MED ORDER — HEART ATTACK BOUNCING BOOK
Freq: Once | Status: AC
  Administered 2016-03-31: 23:00:00
  Filled 2016-03-31: qty 1

## 2016-03-31 MED ORDER — FUROSEMIDE 40 MG PO TABS
40.0000 mg | ORAL_TABLET | Freq: Every day | ORAL | Status: DC
  Administered 2016-04-01: 09:00:00 40 mg via ORAL
  Filled 2016-03-31: qty 1

## 2016-03-31 MED ORDER — VERAPAMIL HCL 2.5 MG/ML IV SOLN
INTRAVENOUS | Status: AC
  Filled 2016-03-31: qty 2

## 2016-03-31 MED ORDER — LIDOCAINE HCL (PF) 1 % IJ SOLN
INTRAMUSCULAR | Status: DC | PRN
  Administered 2016-03-31: 2 mL via SUBCUTANEOUS

## 2016-03-31 MED ORDER — SODIUM CHLORIDE 0.9 % IV SOLN
INTRAVENOUS | Status: AC
  Administered 2016-03-31: 17:00:00 via INTRAVENOUS

## 2016-03-31 MED ORDER — MIDAZOLAM HCL 2 MG/2ML IJ SOLN
INTRAMUSCULAR | Status: DC | PRN
  Administered 2016-03-31: 1 mg via INTRAVENOUS

## 2016-03-31 MED ORDER — LIDOCAINE HCL (PF) 1 % IJ SOLN
INTRAMUSCULAR | Status: AC
  Filled 2016-03-31: qty 30

## 2016-03-31 MED ORDER — HEPARIN (PORCINE) IN NACL 2-0.9 UNIT/ML-% IJ SOLN
INTRAMUSCULAR | Status: DC | PRN
  Administered 2016-03-31: 1000 mL

## 2016-03-31 MED ORDER — FENTANYL CITRATE (PF) 100 MCG/2ML IJ SOLN
INTRAMUSCULAR | Status: DC | PRN
  Administered 2016-03-31: 25 ug via INTRAVENOUS

## 2016-03-31 MED ORDER — HEPARIN SODIUM (PORCINE) 1000 UNIT/ML IJ SOLN
INTRAMUSCULAR | Status: DC | PRN
  Administered 2016-03-31: 4000 [IU] via INTRAVENOUS

## 2016-03-31 MED ORDER — VERAPAMIL HCL 2.5 MG/ML IV SOLN
INTRAVENOUS | Status: DC | PRN
  Administered 2016-03-31: 15:00:00 via INTRA_ARTERIAL

## 2016-03-31 MED ORDER — HEPARIN (PORCINE) IN NACL 100-0.45 UNIT/ML-% IJ SOLN
1350.0000 [IU]/h | INTRAMUSCULAR | Status: DC
  Administered 2016-04-01: 1350 [IU]/h via INTRAVENOUS
  Filled 2016-03-31: qty 250

## 2016-03-31 MED ORDER — YOU HAVE A PACEMAKER BOOK
Freq: Once | Status: AC
  Administered 2016-03-31: 23:00:00
  Filled 2016-03-31: qty 1

## 2016-03-31 MED ORDER — SODIUM CHLORIDE 0.9 % IV SOLN: 250.0000 mL | INTRAVENOUS | Status: DC | PRN

## 2016-03-31 MED ORDER — IOPAMIDOL (ISOVUE-370) INJECTION 76%
INTRAVENOUS | Status: AC
  Filled 2016-03-31: qty 100

## 2016-03-31 MED ORDER — MIDAZOLAM HCL 2 MG/2ML IJ SOLN
INTRAMUSCULAR | Status: AC
  Filled 2016-03-31: qty 2

## 2016-03-31 MED ORDER — SODIUM CHLORIDE 0.9% FLUSH: 3.0000 mL | Freq: Two times a day (BID) | INTRAVENOUS | Status: DC

## 2016-03-31 MED ORDER — IOPAMIDOL (ISOVUE-370) INJECTION 76%
INTRAVENOUS | Status: DC | PRN
  Administered 2016-03-31: 40 mL

## 2016-03-31 MED ORDER — HEPARIN SODIUM (PORCINE) 1000 UNIT/ML IJ SOLN
INTRAMUSCULAR | Status: AC
  Filled 2016-03-31: qty 1

## 2016-03-31 MED ORDER — SODIUM CHLORIDE 0.9% FLUSH: 3.0000 mL | INTRAVENOUS | Status: DC | PRN

## 2016-03-31 MED ORDER — FENTANYL CITRATE (PF) 100 MCG/2ML IJ SOLN
INTRAMUSCULAR | Status: AC
  Filled 2016-03-31: qty 2

## 2016-03-31 SURGICAL SUPPLY — 10 items
CATH INFINITI 5FR ANG PIGTAIL (CATHETERS) ×2 IMPLANT
CATH OPTITORQUE JACKY 4.0 5F (CATHETERS) ×2 IMPLANT
DEVICE RAD COMP TR BAND LRG (VASCULAR PRODUCTS) ×2 IMPLANT
GLIDESHEATH SLEND SS 6F .021 (SHEATH) ×2 IMPLANT
KIT HEART LEFT (KITS) ×2 IMPLANT
PACK CARDIAC CATHETERIZATION (CUSTOM PROCEDURE TRAY) ×2 IMPLANT
SYR MEDRAD MARK V 150ML (SYRINGE) ×2 IMPLANT
TRANSDUCER W/STOPCOCK (MISCELLANEOUS) ×2 IMPLANT
TUBING CIL FLEX 10 FLL-RA (TUBING) ×2 IMPLANT
WIRE SAFE-T 1.5MM-J .035X260CM (WIRE) ×2 IMPLANT

## 2016-03-31 NOTE — H&P (View-Only) (Signed)
Patient Name: Shaun May Date of Encounter: 03/31/2016     Active Problems:   Essential hypertension   SOB (shortness of breath)   Acute CHF (congestive heart failure) (HCC)   COPD (chronic obstructive pulmonary disease) (HCC)   Elevated troponin   Restless leg syndrome   Pulmonary nodule, right    SUBJECTIVE  Feeling much better. Urinating a lot. He feels fine to lay flat. No chest pain.  CURRENT MEDS . aspirin EC  81 mg Oral Daily  . atorvastatin  40 mg Oral q1800  . carvedilol  25 mg Oral BID WC  . furosemide  40 mg Intravenous BID  . guaiFENesin  600 mg Oral BID  . hydrALAZINE  50 mg Oral Q8H  . lisinopril  40 mg Oral Daily  . nicotine  21 mg Transdermal Daily  . nitroGLYCERIN  1 inch Topical Q6H  . potassium chloride SA  20 mEq Oral Daily  . predniSONE  20 mg Oral Q breakfast  . sodium chloride flush  3 mL Intravenous Q12H  . sodium chloride flush  3 mL Intravenous Q12H    OBJECTIVE  Filed Vitals:   03/30/16 2000 03/30/16 2100 03/31/16 0640 03/31/16 0744  BP: 127/64 140/71 138/73   Pulse: 67  74   Temp:   97.5 F (36.4 C) 97.9 F (36.6 C)  TempSrc:   Oral Oral  Resp: '26 28 15   '$ Height:      Weight:   177 lb 11.1 oz (80.6 kg)   SpO2: 96%  93%     Intake/Output Summary (Last 24 hours) at 03/31/16 0949 Last data filed at 03/31/16 0745  Gross per 24 hour  Intake    240 ml  Output    850 ml  Net   -610 ml   Filed Weights   03/30/16 0500 03/30/16 1732 03/31/16 0640  Weight: 188 lb 8 oz (85.503 kg) 177 lb 12.1 oz (80.63 kg) 177 lb 11.1 oz (80.6 kg)    PHYSICAL EXAM  General: Pleasant, NAD. Neuro: Alert and oriented X 3. Moves all extremities spontaneously. Psych: Normal affect. HEENT:  Normal  Neck: Supple without bruits or JVD. Lungs:  Resp regular and unlabored, CTA. Heart: RRR no s3, s4, or murmurs. Abdomen: Soft, non-tender, non-distended, BS + x 4.  Extremities: No clubbing, cyanosis or edema. DP/PT/Radials 2+ and equal  bilaterally.  Accessory Clinical Findings  CBC  Recent Labs  03/29/16 0807 03/30/16 0539  WBC 19.6* 13.6*  NEUTROABS 17.4*  --   HGB 13.3 11.3*  HCT 40.2 34.2*  MCV 95.3 95.3  PLT 269 700   Basic Metabolic Panel  Recent Labs  03/29/16 0807 03/30/16 0539  NA 136 136  K 3.8 3.6  CL 99* 98*  CO2 27 30  GLUCOSE 117* 115*  BUN 20 30*  CREATININE 0.76 0.75  CALCIUM 9.1 8.8*   Liver Function Tests  Recent Labs  03/29/16 0807  AST 132*  ALT 105*  ALKPHOS 73  BILITOT 1.5*  PROT 7.6  ALBUMIN 3.8   No results for input(s): LIPASE, AMYLASE in the last 72 hours. Cardiac Enzymes  Recent Labs  03/29/16 2020 03/30/16 0149 03/30/16 0539  TROPONINI 0.69* 0.56* 0.47*   BNP Invalid input(s): POCBNP D-Dimer  Recent Labs  03/29/16 0807  DDIMER 1.19*   Hemoglobin A1C No results for input(s): HGBA1C in the last 72 hours. Fasting Lipid Panel No results for input(s): CHOL, HDL, LDLCALC, TRIG, CHOLHDL, LDLDIRECT in the last 72 hours.  Thyroid Function Tests  Recent Labs  03/29/16 0807  TSH 0.514    TELE  paced  Radiology/Studies  Dg Chest 2 View  03/29/2016  CLINICAL DATA:  Shortness of breath.  COPD exacerbation. EXAM: CHEST  2 VIEW COMPARISON:  03/28/2016 FINDINGS: mild hyperinflation. moderate thoracic spondylosis. Prominent upper thoracic osteophytes, causing increased density over the spine on the lateral view. Pacer with leads at right atrium and right ventricle. No lead discontinuity. Midline trachea. Mild cardiomegaly with transverse aortic atherosclerosis. Trace bilateral pleural effusions. Mild pulmonary venous congestion. No lobar consolidation. No pneumothorax. IMPRESSION: Cardiomegaly and mild pulmonary venous congestion. Probable trace bilateral pleural effusions. Electronically Signed   By: Abigail Miyamoto M.D.   On: 03/29/2016 08:17   Dg Chest 2 View  03/28/2016  CLINICAL DATA:  Shortness of breath EXAM: CHEST  2 VIEW COMPARISON:  03/27/2016  FINDINGS: Lungs are clear. Small bilateral pleural effusions. No pneumothorax. Stable cardiomegaly.  Left subclavian pacemaker. Degenerative changes of the visualized thoracolumbar spine. IMPRESSION: Small bilateral pleural effusions. Electronically Signed   By: Julian Hy M.D.   On: 03/28/2016 11:11   Dg Chest 2 View  03/27/2016  CLINICAL DATA:  Chest pain and shortness of breath for 6 hours. EXAM: CHEST  2 VIEW COMPARISON:  10/29/2015 FINDINGS: Dual lead left-sided pacemaker in place. The ventricular lead is no slightly tortuous. There is cardiomegaly that is new from prior exam. No pulmonary edema. No confluent airspace disease. No pleural effusion or pneumothorax. There is degenerative change in the spine. IMPRESSION: 1. Cardiomegaly, new from exam 5 months prior. The ventricular lead of the dual lead pacemaker is tortuous. 2. No pulmonary edema or localizing process. Electronically Signed   By: Jeb Levering M.D.   On: 03/27/2016 02:44   Ct Angio Chest Pe W/cm &/or Wo Cm  03/29/2016  CLINICAL DATA:  [facility] called out for sob. Pt was discharged yesterday from here on April 22 nd for COPD exacerbation. Pt reports feeling sob since being discharged but pt got worse during the night tonight EXAM: CT ANGIOGRAPHY CHEST WITH CONTRAST TECHNIQUE: Multidetector CT imaging of the chest was performed using the standard protocol during bolus administration of intravenous contrast. Multiplanar CT image reconstructions and MIPs were obtained to evaluate the vascular anatomy. CONTRAST:  175m OMNIPAQUE IOHEXOL 350 MG/ML SOLN COMPARISON:  03/14/2008 FINDINGS: Vascular: Left subclavian pacemaker leads extend to the right atrium and right ventricular apex. Right arm IV contrast injection. The SVC is patent. Mild right atrial enlargement. The right ventricle is nondilated. Mild dilatation of central pulmonary arteries. Patent bilateral pulmonary veins drain into the left atrium. Scattered coronary calcifications.  Adequate contrast opacification of the thoracic aorta with no evidence of dissection, aneurysm, or stenosis. There is classic 3-vessel brachiocephalic arch anatomy without proximal stenosis. Scattered calcifications in the arch and descending thoracic segment. Visualized suprarenal abdominal aorta shows minimal atheromatous plaque. Mediastinum/Lymph Nodes: No masses or pathologically enlarged lymph nodes identified. Moderate pericardial effusion. Lungs/Pleura: Bilateral small pleural effusions. Pulmonary emphysema. 17 mm spiculated nodule in posterior segment right upper lobe image 37/5. Subsegmental dependent atelectasis posteriorly in both lower lobes. Upper abdomen: No acute findings. Musculoskeletal: Flowing osteophytes across multiple contiguous levels in the mid and lower thoracic spine. Sternum intact. Review of the MIP images confirms the above findings. IMPRESSION: 1. Negative for acute PE or thoracic aortic dissection. 2. Pericardial and bilateral pleural effusions. 3. 17 mm spiculated posterior right upper lobe nodule suggesting primary bronchogenic carcinoma. Consider thoracic surgical consultation. 4. Atherosclerosis, including aortic  and coronary artery disease. Please note that although the presence of coronary artery calcium documents the presence of coronary artery disease, the severity of this disease and any potential stenosis cannot be assessed on this non-gated CT examination. Assessment for potential risk factor modification, dietary therapy or pharmacologic therapy may be warranted, if clinically indicated. Electronically Signed   By: Lucrezia Europe M.D.   On: 03/29/2016 13:32    2D ECHO: 03/30/2016 LV EF: 35% - 40% Study Conclusions - Left ventricle: The cavity size was normal. Wall thickness was  increased in a pattern of mild LVH. Systolic function was  moderately reduced. The estimated ejection fraction was in the  range of 35% to 40%. Doppler parameters are consistent with   abnormal left ventricular relaxation (grade 1 diastolic  dysfunction). Doppler parameters are consistent with high  ventricular filling pressure. - Regional wall motion abnormality: Moderate hypokinesis of the  apical anterior, apical inferior, apical septal, apical lateral,  and apical myocardium; mild hypokinesis of the mid anteroseptal  myocardium. - Left atrium: The atrium was moderately dilated. - Right ventricle: Pacer wire or catheter noted in right ventricle. - Right atrium: Pacer wire or catheter noted in right atrium. - Inferior vena cava: The vessel was dilated. The respirophasic  diameter changes were normal. Estimated CVP 8 mmHg. - Pericardium, extracardiac: A small to moderate size pericardial  effusion was identified. While there was some mild right atrial  free wall undulation, there was no definitive tamponade  physiology.   ASSESSMENT AND PLAN Mr. Nordby is a 65 y.o.male with known history of CHB s/p [employer] PPM (10/2015), HTN, hemochromotosis, COPD, chronic tobacco abuse, ETOH abuse and recent admission for COPD exacerbation who presented back to [facility] with worsening shortness of breath. Troponin was elevated and 2D ECHO with new cardiomyopathy with WMA in LAD distribution. He was transferred to [facility] for coronary angiography.   Elevated troponin: initially felt to be demand ischemia in the setting of hypoxia and mild CHF. However, repeat 2D ECHO showed interval worsening of LV function (EF 35-40%) with WMA concerning for LAD infarct. He was placed on IV heparin and transferred to [facility] for [facility] angiography.   Elevated D-Dimer: CT angio negative for PE.  Incidental pulmonary nodule: seen on CT angio. RUL nodule suspicious for bronchogenic carcinoma. This will require further work up at some point.   Acute on chronic systolic CHF: EF previously normal. Now 35-40%. CXR and CTA with pulmonary vascular congestion. BNP elevated. Started on IV diuresis w/ Lasix '40mg'$   BID. Net neg 2.23L.  -- Continue Coreg and lisinopril   Pericardial effusion: small to moderate on 2D ECHO w/ no definitive tamponade physiology.  Atrial flutter: ECG on 03/29/16 noted to be in atrial flutter. This would be a new diagnosis. Now AV paced. CHADSVASC score of 2 (CHF and HTN.) Currently on IV heparin. Would need to discuss long term AC but will wait until after cath today to see if he requires stenting. May need to use plavix with possible concomitant NOAC use.   HTN: BP currently well controlled.  COPD: stable.    Tobacco abuse/alcohol abuse: he needs to quit smoking and drinkning. Continue nicotine patch   Leukocytosis: likely related to steroids for COPD flare  Signed, Eileen Stanford PA-C  Pager [phone]

## 2016-03-31 NOTE — Interval H&P Note (Signed)
History and Physical Interval Note:  03/31/2016 2:59 PM  Shaun May  has presented today for surgery, with the diagnosis of NSTEMI  The various methods of treatment have been discussed with the patient and family. After consideration of risks, benefits and other options for treatment, the patient has consented to  Procedure(s): Left Heart Cath and Coronary Angiography (N/A) as a surgical intervention .  The patient's history has been reviewed, patient examined, no change in status, stable for surgery.  I have reviewed the patient's chart and labs.  Questions were answered to the patient's satisfaction.     Kathlyn [place]

## 2016-03-31 NOTE — Progress Notes (Signed)
Patient Name: Shaun May Date of Encounter: 03/31/2016     Active Problems:   Essential hypertension   SOB (shortness of breath)   Acute CHF (congestive heart failure) (HCC)   COPD (chronic obstructive pulmonary disease) (HCC)   Elevated troponin   Restless leg syndrome   Pulmonary nodule, right    SUBJECTIVE  Feeling much better. Urinating a lot. He feels fine to lay flat. No chest pain.  CURRENT MEDS . aspirin EC  81 mg Oral Daily  . atorvastatin  40 mg Oral q1800  . carvedilol  25 mg Oral BID WC  . furosemide  40 mg Intravenous BID  . guaiFENesin  600 mg Oral BID  . hydrALAZINE  50 mg Oral Q8H  . lisinopril  40 mg Oral Daily  . nicotine  21 mg Transdermal Daily  . nitroGLYCERIN  1 inch Topical Q6H  . potassium chloride SA  20 mEq Oral Daily  . predniSONE  20 mg Oral Q breakfast  . sodium chloride flush  3 mL Intravenous Q12H  . sodium chloride flush  3 mL Intravenous Q12H    OBJECTIVE  Filed Vitals:   03/30/16 2000 03/30/16 2100 03/31/16 0640 03/31/16 0744  BP: 127/64 140/71 138/73   Pulse: 67  74   Temp:   97.5 F (36.4 C) 97.9 F (36.6 C)  TempSrc:   Oral Oral  Resp: '26 28 15   '$ Height:      Weight:   177 lb 11.1 oz (80.6 kg)   SpO2: 96%  93%     Intake/Output Summary (Last 24 hours) at 03/31/16 0949 Last data filed at 03/31/16 0745  Gross per 24 hour  Intake    240 ml  Output    850 ml  Net   -610 ml   Filed Weights   03/30/16 0500 03/30/16 1732 03/31/16 0640  Weight: 188 lb 8 oz (85.503 kg) 177 lb 12.1 oz (80.63 kg) 177 lb 11.1 oz (80.6 kg)    PHYSICAL EXAM  General: Pleasant, NAD. Neuro: Alert and oriented X 3. Moves all extremities spontaneously. Psych: Normal affect. HEENT:  Normal  Neck: Supple without bruits or JVD. Lungs:  Resp regular and unlabored, CTA. Heart: RRR no s3, s4, or murmurs. Abdomen: Soft, non-tender, non-distended, BS + x 4.  Extremities: No clubbing, cyanosis or edema. DP/PT/Radials 2+ and equal  bilaterally.  Accessory Clinical Findings  CBC  Recent Labs  03/29/16 0807 03/30/16 0539  WBC 19.6* 13.6*  NEUTROABS 17.4*  --   HGB 13.3 11.3*  HCT 40.2 34.2*  MCV 95.3 95.3  PLT 269 498   Basic Metabolic Panel  Recent Labs  03/29/16 0807 03/30/16 0539  NA 136 136  K 3.8 3.6  CL 99* 98*  CO2 27 30  GLUCOSE 117* 115*  BUN 20 30*  CREATININE 0.76 0.75  CALCIUM 9.1 8.8*   Liver Function Tests  Recent Labs  03/29/16 0807  AST 132*  ALT 105*  ALKPHOS 73  BILITOT 1.5*  PROT 7.6  ALBUMIN 3.8   No results for input(s): LIPASE, AMYLASE in the last 72 hours. Cardiac Enzymes  Recent Labs  03/29/16 2020 03/30/16 0149 03/30/16 0539  TROPONINI 0.69* 0.56* 0.47*   BNP Invalid input(s): POCBNP D-Dimer  Recent Labs  03/29/16 0807  DDIMER 1.19*   Hemoglobin A1C No results for input(s): HGBA1C in the last 72 hours. Fasting Lipid Panel No results for input(s): CHOL, HDL, LDLCALC, TRIG, CHOLHDL, LDLDIRECT in the last 72 hours.  Thyroid Function Tests  Recent Labs  03/29/16 0807  TSH 0.514    TELE  paced  Radiology/Studies  Dg Chest 2 View  03/29/2016  CLINICAL DATA:  Shortness of breath.  COPD exacerbation. EXAM: CHEST  2 VIEW COMPARISON:  03/28/2016 FINDINGS: mild hyperinflation. moderate thoracic spondylosis. Prominent upper thoracic osteophytes, causing increased density over the spine on the lateral view. Pacer with leads at right atrium and right ventricle. No lead discontinuity. Midline trachea. Mild cardiomegaly with transverse aortic atherosclerosis. Trace bilateral pleural effusions. Mild pulmonary venous congestion. No lobar consolidation. No pneumothorax. IMPRESSION: Cardiomegaly and mild pulmonary venous congestion. Probable trace bilateral pleural effusions. Electronically Signed   By: Abigail Miyamoto M.D.   On: 03/29/2016 08:17   Dg Chest 2 View  03/28/2016  CLINICAL DATA:  Shortness of breath EXAM: CHEST  2 VIEW COMPARISON:  03/27/2016  FINDINGS: Lungs are clear. Small bilateral pleural effusions. No pneumothorax. Stable cardiomegaly.  Left subclavian pacemaker. Degenerative changes of the visualized thoracolumbar spine. IMPRESSION: Small bilateral pleural effusions. Electronically Signed   By: Julian Hy M.D.   On: 03/28/2016 11:11   Dg Chest 2 View  03/27/2016  CLINICAL DATA:  Chest pain and shortness of breath for 6 hours. EXAM: CHEST  2 VIEW COMPARISON:  10/29/2015 FINDINGS: Dual lead left-sided pacemaker in place. The ventricular lead is no slightly tortuous. There is cardiomegaly that is new from prior exam. No pulmonary edema. No confluent airspace disease. No pleural effusion or pneumothorax. There is degenerative change in the spine. IMPRESSION: 1. Cardiomegaly, new from exam 5 months prior. The ventricular lead of the dual lead pacemaker is tortuous. 2. No pulmonary edema or localizing process. Electronically Signed   By: Jeb Levering M.D.   On: 03/27/2016 02:44   Ct Angio Chest Pe W/cm &/or Wo Cm  03/29/2016  CLINICAL DATA:  [facility] called out for sob. Pt was discharged yesterday from here on April 22 nd for COPD exacerbation. Pt reports feeling sob since being discharged but pt got worse during the night tonight EXAM: CT ANGIOGRAPHY CHEST WITH CONTRAST TECHNIQUE: Multidetector CT imaging of the chest was performed using the standard protocol during bolus administration of intravenous contrast. Multiplanar CT image reconstructions and MIPs were obtained to evaluate the vascular anatomy. CONTRAST:  179m OMNIPAQUE IOHEXOL 350 MG/ML SOLN COMPARISON:  03/14/2008 FINDINGS: Vascular: Left subclavian pacemaker leads extend to the right atrium and right ventricular apex. Right arm IV contrast injection. The SVC is patent. Mild right atrial enlargement. The right ventricle is nondilated. Mild dilatation of central pulmonary arteries. Patent bilateral pulmonary veins drain into the left atrium. Scattered coronary calcifications.  Adequate contrast opacification of the thoracic aorta with no evidence of dissection, aneurysm, or stenosis. There is classic 3-vessel brachiocephalic arch anatomy without proximal stenosis. Scattered calcifications in the arch and descending thoracic segment. Visualized suprarenal abdominal aorta shows minimal atheromatous plaque. Mediastinum/Lymph Nodes: No masses or pathologically enlarged lymph nodes identified. Moderate pericardial effusion. Lungs/Pleura: Bilateral small pleural effusions. Pulmonary emphysema. 17 mm spiculated nodule in posterior segment right upper lobe image 37/5. Subsegmental dependent atelectasis posteriorly in both lower lobes. Upper abdomen: No acute findings. Musculoskeletal: Flowing osteophytes across multiple contiguous levels in the mid and lower thoracic spine. Sternum intact. Review of the MIP images confirms the above findings. IMPRESSION: 1. Negative for acute PE or thoracic aortic dissection. 2. Pericardial and bilateral pleural effusions. 3. 17 mm spiculated posterior right upper lobe nodule suggesting primary bronchogenic carcinoma. Consider thoracic surgical consultation. 4. Atherosclerosis, including aortic  and coronary artery disease. Please note that although the presence of coronary artery calcium documents the presence of coronary artery disease, the severity of this disease and any potential stenosis cannot be assessed on this non-gated CT examination. Assessment for potential risk factor modification, dietary therapy or pharmacologic therapy may be warranted, if clinically indicated. Electronically Signed   By: Lucrezia Europe M.D.   On: 03/29/2016 13:32    2D ECHO: 03/30/2016 LV EF: 35% - 40% Study Conclusions - Left ventricle: The cavity size was normal. Wall thickness was  increased in a pattern of mild LVH. Systolic function was  moderately reduced. The estimated ejection fraction was in the  range of 35% to 40%. Doppler parameters are consistent with   abnormal left ventricular relaxation (grade 1 diastolic  dysfunction). Doppler parameters are consistent with high  ventricular filling pressure. - Regional wall motion abnormality: Moderate hypokinesis of the  apical anterior, apical inferior, apical septal, apical lateral,  and apical myocardium; mild hypokinesis of the mid anteroseptal  myocardium. - Left atrium: The atrium was moderately dilated. - Right ventricle: Pacer wire or catheter noted in right ventricle. - Right atrium: Pacer wire or catheter noted in right atrium. - Inferior vena cava: The vessel was dilated. The respirophasic  diameter changes were normal. Estimated CVP 8 mmHg. - Pericardium, extracardiac: A small to moderate size pericardial  effusion was identified. While there was some mild right atrial  free wall undulation, there was no definitive tamponade  physiology.   ASSESSMENT AND PLAN Mr. 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 Troponin was elevated and 2D ECHO with new cardiomyopathy with WMA in LAD distribution. He was transferred to [facility] for coronary angiography.   Elevated troponin: initially felt to be demand ischemia in the setting of hypoxia and mild CHF. However, repeat 2D ECHO showed interval worsening of LV function (EF 35-40%) with WMA concerning for LAD infarct. He was placed on IV heparin and transferred to [facility] for [facility] angiography.   Elevated D-Dimer: CT angio negative for PE.  Incidental pulmonary nodule: seen on CT angio. RUL nodule suspicious for bronchogenic carcinoma. This will require further work up at some point.   Acute on chronic systolic CHF: EF previously normal. Now 35-40%. CXR and CTA with pulmonary vascular congestion. BNP elevated. Started on IV diuresis w/ Lasix '40mg'$   BID. Net neg 2.23L.  -- Continue Coreg and lisinopril   Pericardial effusion: small to moderate on 2D ECHO w/ no definitive tamponade physiology.  Atrial flutter: ECG on 03/29/16 noted to be in atrial flutter. This would be a new diagnosis. Now AV paced. CHADSVASC score of 2 (CHF and HTN.) Currently on IV heparin. Would need to discuss long term AC but will wait until after cath today to see if he requires stenting. May need to use plavix with possible concomitant NOAC use.   HTN: BP currently well controlled.  COPD: stable.    Tobacco abuse/alcohol abuse: he needs to quit smoking and drinkning. Continue nicotine patch   Leukocytosis: likely related to steroids for COPD flare  Signed, Eileen Stanford PA-C  Pager [phone]

## 2016-03-31 NOTE — Progress Notes (Signed)
TR BAND REMOVAL  LOCATION:    right radial  DEFLATED PER PROTOCOL:    Yes.    TIME BAND OFF / DRESSING APPLIED:    1830   SITE UPON ARRIVAL:    Level 0  SITE AFTER BAND REMOVAL:    Level 0  CIRCULATION SENSATION AND MOVEMENT:    Within Normal Limits   Yes.    COMMENTS:   Dressing dry and intact and site unchanged when assessed at 1900

## 2016-03-31 NOTE — Progress Notes (Signed)
ANTICOAGULATION CONSULT NOTE - Initial Consult  Pharmacy Consult for heparin Indication: new onset atrial flutter  No Known Allergies  Patient Measurements: Height: '5\' 10"'$  (177.8 cm) Weight: 177 lb 11.1 oz (80.6 kg) IBW/kg (Calculated) : 73 Heparin Dosing Weight: 80 kg  Vital Signs: Temp: 98.1 F (36.7 C) (04/26 1304) Temp Source: Oral (04/26 1304) BP: 116/68 mmHg (04/26 1600) Pulse Rate: 73 (04/26 1600)  Labs:  Recent Labs  03/29/16 0807 03/29/16 2020 03/30/16 0149 03/30/16 0539 03/30/16 1805  HGB 13.3  --   --  11.3*  --   HCT 40.2  --   --  34.2*  --   PLT 269  --   --  257  --   LABPROT  --   --   --   --  14.9  INR  --   --   --   --  1.16  HEPARINUNFRC  --  <0.10*  --  0.37  --   CREATININE 0.76  --   --  0.75  --   TROPONINI  --  0.69* 0.56* 0.47*  --     Estimated Creatinine Clearance: 96.3 mL/min (by C-G formula based on Cr of 0.75).   Medical History: Past Medical History  Diagnosis Date  . Hypertension   . Hemochromatosis   . Mobitz type 2 second degree heart block     PPM MDT 10/28/15 Dr. Lovena Le    Assessment: 43 YOM with progressive dyspnea, s/p CATH, which revealed reduced LVEF (35-40%), and new onset atrial flutter, pharmacy is consulted to start heparin 8 hrs after sheath removal. May consider oral anticoagulation tomorrow. Baseline INR 1.16, previously therapeutic on 130 units/hr. TR band to be removed at 1830   Goal of Therapy:  Heparin level 0.3-0.7 units/ml Monitor platelets by anticoagulation protocol: Yes   Plan:  ReStart IV heparin infusion 1350 units/hr at 0230 6 hr Heparin level at [place], PharmD, BCPS  Clinical Pharmacist  Pager: [phone]   03/31/2016,4:25 PM

## 2016-04-01 ENCOUNTER — Encounter (HOSPITAL_COMMUNITY): Payer: Self-pay | Admitting: Cardiovascular Disease

## 2016-04-01 ENCOUNTER — Telehealth: Payer: Self-pay | Admitting: Cardiovascular Disease

## 2016-04-01 ENCOUNTER — Telehealth: Payer: Self-pay | Admitting: *Deleted

## 2016-04-01 DIAGNOSIS — I5181 Takotsubo syndrome: Secondary | ICD-10-CM

## 2016-04-01 DIAGNOSIS — I483 Typical atrial flutter: Secondary | ICD-10-CM

## 2016-04-01 DIAGNOSIS — R911 Solitary pulmonary nodule: Secondary | ICD-10-CM

## 2016-04-01 DIAGNOSIS — J449 Chronic obstructive pulmonary disease, unspecified: Secondary | ICD-10-CM

## 2016-04-01 LAB — BASIC METABOLIC PANEL
ANION GAP: 9 (ref 5–15)
BUN: 24 mg/dL — ABNORMAL HIGH (ref 6–20)
CHLORIDE: 96 mmol/L — AB (ref 101–111)
CO2: 31 mmol/L (ref 22–32)
Calcium: 8.4 mg/dL — ABNORMAL LOW (ref 8.9–10.3)
Creatinine, Ser: 0.72 mg/dL (ref 0.61–1.24)
GFR calc non Af Amer: >60 mL/min (ref >60)
Glucose, Bld: 97 mg/dL (ref 65–99)
POTASSIUM: 3.2 mmol/L — AB (ref 3.5–5.1)
SODIUM: 136 mmol/L (ref 135–145)

## 2016-04-01 LAB — CBC
HEMATOCRIT: 30 % — AB (ref 39.0–52.0)
HEMOGLOBIN: 9.8 g/dL — AB (ref 13.0–17.0)
MCH: 30.7 pg (ref 26.0–34.0)
MCHC: 32.7 g/dL (ref 30.0–36.0)
MCV: 94 fL (ref 78.0–100.0)
Platelets: 256 10*3/uL (ref 150–400)
RBC: 3.19 MIL/uL — AB (ref 4.22–5.81)
RDW: 14.1 % (ref 11.5–15.5)
WBC: 10.6 10*3/uL — AB (ref 4.0–10.5)

## 2016-04-01 LAB — CULTURE, BLOOD (ROUTINE X 2)
CULTURE: NO GROWTH
Culture: NO GROWTH

## 2016-04-01 MED ORDER — FUROSEMIDE 40 MG PO TABS: 40.0000 mg | ORAL_TABLET | Freq: Every day | ORAL | Status: DC

## 2016-04-01 MED ORDER — ALBUTEROL SULFATE (2.5 MG/3ML) 0.083% IN NEBU: 2.5000 mg | INHALATION_SOLUTION | RESPIRATORY_TRACT | Status: DC | PRN

## 2016-04-01 MED ORDER — ATORVASTATIN CALCIUM 40 MG PO TABS: 40.0000 mg | ORAL_TABLET | Freq: Every day | ORAL | Status: DC

## 2016-04-01 MED ORDER — ASPIRIN 81 MG PO TBEC: 81.0000 mg | DELAYED_RELEASE_TABLET | Freq: Every day | ORAL | Status: DC

## 2016-04-01 MED ORDER — RIVAROXABAN 20 MG PO TABS
20.0000 mg | ORAL_TABLET | Freq: Every day | ORAL | Status: DC
  Administered 2016-04-01: 20 mg via ORAL
  Filled 2016-04-01: qty 1

## 2016-04-01 MED ORDER — RIVAROXABAN 20 MG PO TABS: 20.0000 mg | ORAL_TABLET | Freq: Every day | ORAL | Status: DC

## 2016-04-01 MED ORDER — POTASSIUM CHLORIDE CRYS ER 20 MEQ PO TBCR
40.0000 meq | EXTENDED_RELEASE_TABLET | Freq: Once | ORAL | Status: AC
  Administered 2016-04-01: 14:00:00 40 meq via ORAL
  Filled 2016-04-01: qty 2

## 2016-04-01 NOTE — Telephone Encounter (Signed)
TCM f/u Friday 04/09/16 w/ Dr. Bronson Ing

## 2016-04-01 NOTE — Discharge Instructions (Addendum)
Information on my medicine - XARELTO (Rivaroxaban)  This medication education was reviewed with me or my healthcare representative as part of my discharge preparation.  The pharmacist that spoke with me during my hospital stay was:  Tad Moore, [facility]  Why was Xarelto prescribed for you? Xarelto was prescribed for you to reduce the risk of a blood clot forming that can cause a stroke if you have a medical condition called atrial fibrillation (a type of irregular heartbeat).  What do you need to know about xarelto ? Take your Xarelto ONCE DAILY at the same time every day with your evening meal. If you have difficulty swallowing the tablet whole, you may crush it and mix in applesauce just prior to taking your dose.  Take Xarelto exactly as prescribed by your doctor and DO NOT stop taking Xarelto without talking to the doctor who prescribed the medication.  Stopping without other stroke prevention medication to take the place of Xarelto may increase your risk of developing a clot that causes a stroke.  Refill your prescription before you run out.  After discharge, you should have regular check-up appointments with your healthcare provider that is prescribing your Xarelto.  In the future your dose may need to be changed if your kidney function or weight changes by a significant amount.  What do you do if you miss a dose? If you are taking Xarelto ONCE DAILY and you miss a dose, take it as soon as you remember on the same day then continue your regularly scheduled once daily regimen the next day. Do not take two doses of Xarelto at the same time or on the same day.   Important Safety Information A possible side effect of Xarelto is bleeding. You should call your healthcare provider right away if you experience any of the following: ? Bleeding from an injury or your nose that does not stop. ? Unusual colored urine (red or dark brown) or unusual colored stools (red or black). ? Unusual  bruising for unknown reasons. ? A serious fall or if you hit your head (even if there is no bleeding).  Some medicines may interact with Xarelto and might increase your risk of bleeding while on Xarelto. To help avoid this, consult your healthcare provider or pharmacist prior to using any new prescription or non-prescription medications, including herbals, vitamins, non-steroidal anti-inflammatory drugs (NSAIDs) and supplements.  This website has more information on Xarelto: [email or web address]   Radial Site Care Refer to this sheet in the next few weeks. These instructions provide you with information on caring for yourself after your procedure. Your caregiver may also give you more specific instructions. Your treatment has been planned according to current medical practices, but problems sometimes occur. Call your caregiver if you have any problems or questions after your procedure. HOME CARE INSTRUCTIONS  You may shower the day after the procedure.Remove the bandage (dressing) and gently wash the site with plain soap and water.Gently pat the site dry.   Do not apply powder or lotion to the site.   Do not submerge the affected site in water for 3 to 5 days.   Inspect the site at least twice daily.   Do not flex or bend the affected arm for 24 hours.   No lifting over 5 pounds (2.3 kg) for 5 days after your procedure.   Do not drive home if you are discharged the same day of the procedure. Have someone else drive you.   You may drive 24  hours after the procedure unless otherwise instructed by your caregiver.  What to expect:  Any bruising will usually fade within 1 to 2 weeks.   Blood that collects in the tissue (hematoma) may be painful to the touch. It should usually decrease in size and tenderness within 1 to 2 weeks.  SEEK IMMEDIATE MEDICAL CARE IF:  You have unusual pain at the radial site.   You have redness, warmth, swelling, or pain at the radial site.   You have  drainage (other than a small amount of blood on the dressing).   You have chills.   You have a fever or persistent symptoms for more than 72 hours.   You have a fever and your symptoms suddenly get worse.   Your arm becomes pale, cool, tingly, or numb.   You have heavy bleeding from the site. Hold pressure on the site.

## 2016-04-01 NOTE — Discharge Summary (Signed)
Discharge Summary    Patient ID: Shaun May,  MRN: [id number], DOB/AGE: 06-May-1951 65 y.o.  Admit date: 03/29/2016 Discharge date: 04/01/2016  Primary Care Provider: Wende Neighbors Primary Cardiologist: Dr. Bronson Ing Dr. Lovena Le   Discharge Diagnoses    Principal Problem:   Acute on chronic systolic (congestive) heart failure [facility]) Active Problems:   Elevated troponin   Atrial flutter ([place]), paroxysmal   Essential hypertension   COPD (chronic obstructive pulmonary disease) (HCC)   Restless leg syndrome   Pulmonary nodule, right   Pericardial effusion   Allergies No Known Allergies   History of Present Illness     Mr. 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 Troponin was elevated and 2D ECHO with new cardiomyopathy with WMA in LAD distribution. He was transferred to [facility] for coronary angiography.   The patient was discharged from the hospital on 03/28/2016 after admission for COPD exacerbation, with worsening shortness of breath. He was set to have early pneumonia but this was ruled out with unremarkable chest x-ray and improvement in symptoms with nebulizer treatments. He was started on broad-spectrum antibiotics IV Solu-Medrol and IV fluids. After returning home his symptoms returned that same night,with recurrent and progressive shortness of breath. He had worsening wheezing, along with coughing. He called EMS and returned to the hospital.  He was seen by Dr. Bronson Ing and 2D ECHO showed left ventricular systolic function had declined, EF 35-40%. He felt that he may have infarcted his LAD based on regional wall motion abnormalities. With downtrending troponins, he added a statin and restarted IV heparin. He felt he may need revascularization and was transferred to [facility]  for coronary angiography.    Hospital Course     Consultants: PCCM  Elevated troponin: cath 03/31/16 with non-obstructive disease. 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 EF appears to be improving, now at 45% with apical akinesis by ventriculogram. Will continue aspirin. Hold off on DAPT due to need for NOAC for paroxysmal atrial flutter.   Acute on chronic systolic CHF: EF previously normal. Now 35-40%. CXR and CTA with pulmonary vascular congestion. BNP elevated. Started on IV diuresis w/ Lasix '40mg'$  BID. Net neg 2.23L. Discharge weight 178 lbs.  Continue Coreg and lisinopril. Change lasix to 40 mg po daily today.  Elevated D-Dimer: CT angio negative for PE.  Incidental pulmonary nodule: seen on CT angio. RUL nodule suspicious for bronchogenic carcinoma. PCCM consulted during admission who felt pulmonary nodule was high risk for malignancy and recommended outpatient PET scanning. If he has good lung function, they will consider resection. Follow up appointment has been arranged.   Pericardial effusion: small to moderate on 2D ECHO w/ no definitive tamponade physiology. Will need repeat echo in 1 month for re-evaluation of effusion size.  Paroxysmal atrial flutter: ECG on 03/29/16 noted to be in atrial flutter. This is a new diagnosis. Now AV paced. CHADSVASC score of 2 (CHF and HTN.) Started on Xarelto 20 mg daily.  HTN: BP currently well controlled. Hydralazine discontinued due to hypotension.  COPD: stable.Follow up with PCCM.  Tobacco abuse/alcohol abuse: he needs to quit smoking and etoh use. Continue nicotine patch  Hypokalemia: 3.2 today. Discharged on supplementation. BMET at follow up  Anemia: H/H trending down. 14/40.9--> 9.8/30. Recheck CBC at follow up.   The patient has had an uncomplicated hospital  course and is recovering well. The radial catheter site is stable. He has been seen by Dr. Debara Pickett today and deemed ready for discharge home. All follow-up  appointments have been scheduled. Smoking cessation was disscussed in length. Discharge medications are listed below.  _____________  Discharge Vitals Blood pressure 156/75, pulse 64, temperature 97.9 F (36.6 C), temperature source Oral, resp. rate 22, height '5\' 10"'$  (1.778 m), weight 178 lb 2.1 oz (80.8 kg), SpO2 92 %.  Filed Weights   03/30/16 1732 03/31/16 0640 04/01/16 0429  Weight: 177 lb 12.1 oz (80.63 kg) 177 lb 11.1 oz (80.6 kg) 178 lb 2.1 oz (80.8 kg)    Labs & Radiologic Studies     CBC  Recent Labs  03/30/16 0539 04/01/16 0351  WBC 13.6* 10.6*  HGB 11.3* 9.8*  HCT 34.2* 30.0*  MCV 95.3 94.0  PLT 257 672   Basic Metabolic Panel  Recent Labs  03/30/16 0539 04/01/16 0351  NA 136 136  K 3.6 3.2*  CL 98* 96*  CO2 30 31  GLUCOSE 115* 97  BUN 30* 24*  CREATININE 0.75 0.72  CALCIUM 8.8* 8.4*   Cardiac Enzymes  Recent Labs  03/29/16 2020 03/30/16 0149 03/30/16 0539  TROPONINI 0.69* 0.56* 0.47*    Dg Chest 2 View  03/29/2016  CLINICAL DATA:  Shortness of breath.  COPD exacerbation. EXAM: CHEST  2 VIEW COMPARISON:  03/28/2016 FINDINGS: mild hyperinflation. moderate thoracic spondylosis. Prominent upper thoracic osteophytes, causing increased density over the spine on the lateral view. Pacer with leads at right atrium and right ventricle. No lead discontinuity. Midline trachea. Mild cardiomegaly with transverse aortic atherosclerosis. Trace bilateral pleural effusions. Mild pulmonary venous congestion. No lobar consolidation. No pneumothorax. IMPRESSION: Cardiomegaly and mild pulmonary venous congestion. Probable trace bilateral pleural effusions. Electronically Signed   By: Abigail Miyamoto M.D.   On: 03/29/2016 08:17   Dg Chest 2 View  03/28/2016  CLINICAL DATA:  Shortness of breath EXAM: CHEST  2 VIEW COMPARISON:  03/27/2016 FINDINGS: Lungs are clear. Small bilateral pleural effusions. No pneumothorax. Stable cardiomegaly.  Left subclavian pacemaker. Degenerative  changes of the visualized thoracolumbar spine. IMPRESSION: Small bilateral pleural effusions. Electronically Signed   By: Julian Hy M.D.   On: 03/28/2016 11:11   Dg Chest 2 View  03/27/2016  CLINICAL DATA:  Chest pain and shortness of breath for 6 hours. EXAM: CHEST  2 VIEW COMPARISON:  10/29/2015 FINDINGS: Dual lead left-sided pacemaker in place. The ventricular lead is no slightly tortuous. There is cardiomegaly that is new from prior exam. No pulmonary edema. No confluent airspace disease. No pleural effusion or pneumothorax. There is degenerative change in the spine. IMPRESSION: 1. Cardiomegaly, new from exam 5 months prior. The ventricular lead of the dual lead pacemaker is tortuous. 2. No pulmonary edema or localizing process. Electronically Signed   By: Jeb Levering M.D.   On: 03/27/2016 02:44   Ct Angio Chest Pe W/cm &/or Wo Cm  03/29/2016  CLINICAL DATA:  [facility] called out for sob. Pt was discharged yesterday from here on April 22 nd for COPD exacerbation. Pt reports feeling sob since being discharged but pt got worse during the night tonight EXAM: CT ANGIOGRAPHY CHEST WITH CONTRAST TECHNIQUE: Multidetector CT imaging of the chest was performed using the standard protocol during bolus administration of intravenous contrast. Multiplanar CT image reconstructions and MIPs were obtained to evaluate the vascular anatomy. CONTRAST:  194m OMNIPAQUE IOHEXOL 350 MG/ML SOLN COMPARISON:  03/14/2008 FINDINGS: Vascular: Left subclavian pacemaker  leads extend to the right atrium and right ventricular apex. Right arm IV contrast injection. The SVC is patent. Mild right atrial enlargement. The right ventricle is nondilated. Mild dilatation of central pulmonary arteries. Patent bilateral pulmonary veins drain into the left atrium. Scattered coronary calcifications. Adequate contrast opacification of the thoracic aorta with no evidence of dissection, aneurysm, or stenosis. There is classic 3-vessel  brachiocephalic arch anatomy without proximal stenosis. Scattered calcifications in the arch and descending thoracic segment. Visualized suprarenal abdominal aorta shows minimal atheromatous plaque. Mediastinum/Lymph Nodes: No masses or pathologically enlarged lymph nodes identified. Moderate pericardial effusion. Lungs/Pleura: Bilateral small pleural effusions. Pulmonary emphysema. 17 mm spiculated nodule in posterior segment right upper lobe image 37/5. Subsegmental dependent atelectasis posteriorly in both lower lobes. Upper abdomen: No acute findings. Musculoskeletal: Flowing osteophytes across multiple contiguous levels in the mid and lower thoracic spine. Sternum intact. Review of the MIP images confirms the above findings. IMPRESSION: 1. Negative for acute PE or thoracic aortic dissection. 2. Pericardial and bilateral pleural effusions. 3. 17 mm spiculated posterior right upper lobe nodule suggesting primary bronchogenic carcinoma. Consider thoracic surgical consultation. 4. Atherosclerosis, including aortic and coronary artery disease. Please note that although the presence of coronary artery calcium documents the presence of coronary artery disease, the severity of this disease and any potential stenosis cannot be assessed on this non-gated CT examination. Assessment for potential risk factor modification, dietary therapy or pharmacologic therapy may be warranted, if clinically indicated. Electronically Signed   By: Lucrezia Europe M.D.   On: 03/29/2016 13:32     Diagnostic Studies/Procedures  03/31/16 Procedures    Left Heart Cath and Coronary Angiography    Conclusion    1. There is mild left ventricular systolic dysfunction.  1.no evidence of obstructive coronary artery disease. Left dominant system with very short left main. 2. High normal left ventricular end-diastolic pressure. 3. Mildly reduced LV systolic function with an ejection fraction of 45% with apical  akinesis.  Recommendations: The patient's wall motion abnormality is suggestive of stress-induced cardiomyopathy. I do not see any obstructive disease affecting the LAD to explain his wall motion abnormality. i switched furosemide to oral. Heparin can be resumed 8 hours after sheath pull and oral anticoagulation can be started tomorrow for atrial flutter. His blood pressure was running relatively low and I discontinued hydralazine. He is already on carvedilol and lisinopril.      2D ECHO 03/30/2016 LV EF: 35% - 40% Study Conclusions - Left ventricle: The cavity size was normal. Wall thickness was  increased in a pattern of mild LVH. Systolic function was  moderately reduced. The estimated ejection fraction was in the  range of 35% to 40%. Doppler parameters are consistent with  abnormal left ventricular relaxation (grade 1 diastolic  dysfunction). Doppler parameters are consistent with high  ventricular filling pressure. - Regional wall motion abnormality: Moderate hypokinesis of the  apical anterior, apical inferior, apical septal, apical lateral,  and apical myocardium; mild hypokinesis of the mid anteroseptal  myocardium. - Left atrium: The atrium was moderately dilated. - Right ventricle: Pacer wire or catheter noted in right ventricle. - Right atrium: Pacer wire or catheter noted in right atrium. - Inferior vena cava: The vessel was dilated. The respirophasic  diameter changes were normal. Estimated CVP 8 mmHg. - Pericardium, extracardiac: A small to moderate size pericardial  effusion was identified. While there was some mild right atrial  free wall undulation, there was no definitive tamponade  physiology. _____________    Disposition  Pt is being discharged home today in good condition.  Follow-up Plans & Appointments    Follow-up Information    Follow up with Rigoberto Noel., MD On 04/22/2016.   Specialty:  Pulmonary Disease   Why:  0945am     Contact information:   [address] [address] [phone]       Follow up with Herminio Commons, MD On 04/09/2016.   Specialty:  Cardiology   Why:  @ 10am   Contact information:   [address] [place] [address] [phone]        Discharge Medications   Current Discharge Medication List    START taking these medications   Details  albuterol (PROVENTIL) (2.5 MG/3ML) 0.083% nebulizer solution Take 3 mLs (2.5 mg total) by nebulization every 4 (four) hours as needed for wheezing or shortness of breath. Qty: 75 mL, Refills: 12    aspirin EC 81 MG EC tablet Take 1 tablet (81 mg total) by mouth daily.    atorvastatin (LIPITOR) 40 MG tablet Take 1 tablet (40 mg total) by mouth daily at 6 PM. Qty: 30 tablet, Refills: 6    furosemide (LASIX) 40 MG tablet Take 1 tablet (40 mg total) by mouth daily. Qty: 30 tablet, Refills: 6    rivaroxaban (XARELTO) 20 MG TABS tablet Take 1 tablet (20 mg total) by mouth daily with supper. Qty: 30 tablet, Refills: 11      CONTINUE these medications which have NOT CHANGED   Details  carvedilol (COREG) 25 MG tablet Take 25 mg by mouth 2 (two) times daily with a meal.  Refills: 11    guaiFENesin (MUCINEX) 600 MG 12 hr tablet Take 1 tablet (600 mg total) by mouth 2 (two) times daily. Qty: 30 tablet, Refills: 0    lisinopril (PRINIVIL,ZESTRIL) 40 MG tablet Take 40 mg by mouth daily.    LORazepam (ATIVAN) 2 MG tablet Take 1 tablet by mouth every 6 (six) hours as needed for anxiety.     nicotine (NICODERM CQ - DOSED IN MG/24 HOURS) 21 mg/24hr patch Place 1 patch (21 mg total) onto the skin daily. Qty: 28 patch, Refills: 0    oxyCODONE-acetaminophen (PERCOCET/ROXICET) 5-325 MG tablet Take 1-2 tablets by mouth every 6 (six) hours as needed. Qty: 15 tablet, Refills: 0    potassium chloride SA (K-DUR,KLOR-CON) 20 MEQ tablet Take 1 tablet by mouth daily.    predniSONE (DELTASONE) 10 MG tablet Take '40mg'$  po daily for 2 days then  '30mg'$  daily for 2 days then '20mg'$  daily for 2 days then '10mg'$  daily for 2 days then stop Qty: 20 tablet, Refills: 0    PROAIR HFA 108 (90 Base) MCG/ACT inhaler Inhale 2 puffs into the lungs every 4 (four) hours as needed for wheezing or shortness of breath. Qty: 1 Inhaler, Refills: 0    rOPINIRole (REQUIP) 0.25 MG tablet Take 1-2 tablets by mouth at bedtime as needed (restless leg).  Refills: 1      STOP taking these medications     azithromycin (ZITHROMAX) 250 MG tablet      hydrALAZINE (APRESOLINE) 100 MG tablet          Aspirin prescribed at discharge?  YES High Intensity Statin Prescribed? YES Beta Blocker Prescribed? YES For EF 45% or less, Was ACEI/ARB Prescribed? YES ADP Receptor Inhibitor Prescribed? NO, needs a NOAC For EF <40%, Aldosterone Inhibitor Prescribed? NO, hypotension Was EF assessed during THIS hospitalization? YES Was Cardiac Rehab II ordered? (Included Medically  managed Patients): NO   Outstanding Labs/Studies   BMET and CBC (mildly hypokalemic and H/H trending down at discharge)  Duration of Discharge Encounter   Greater than 30 minutes including physician time.  Mable Fill R PA-C 04/01/2016, 3:17 PM

## 2016-04-01 NOTE — Telephone Encounter (Signed)
Patient still admitted, will contact patient once he has been released. Hold in box until completed

## 2016-04-01 NOTE — Progress Notes (Signed)
DAILY PROGRESS NOTE  Subjective:  Cath yesterday shows surprisingly no significant CAD. Suspect a Takasubo cardiomyopathy - EF is already somewhat improved to 45% with apical akinesis. Diuresed about 2L so far. Hypokalemic today.  Objective:  Temp:  [97.7 F (36.5 C)-98.9 F (37.2 C)] 97.8 F (36.6 C) (04/27 0751) Pulse Rate:  [0-237] 64 (04/27 0429) Resp:  [0-34] 17 (04/27 0429) BP: (94-128)/(49-75) 128/66 mmHg (04/27 0429) SpO2:  [0 %-98 %] 92 % (04/27 0429) Weight:  [178 lb 2.1 oz (80.8 kg)] 178 lb 2.1 oz (80.8 kg) (04/27 0429) Weight change: 6 oz (0.17 kg)  Intake/Output from previous day: 04/26 0701 - 04/27 0700 In: 786 [P.O.:480; I.V.:306] Out: 400 [Urine:400]  Intake/Output from this shift: Total I/O In: 120 [P.O.:120] Out: -   Medications: Current Facility-Administered Medications  Medication Dose Route Frequency Provider Last Rate Last Dose  . 0.9 %  sodium chloride infusion  250 mL Intravenous PRN Kathie Dike, MD      . 0.9 %  sodium chloride infusion  250 mL Intravenous PRN Wellington Hampshire, MD      . acetaminophen (TYLENOL) tablet 650 mg  650 mg Oral Q4H PRN Kathie Dike, MD      . albuterol (PROVENTIL) (2.5 MG/3ML) 0.083% nebulizer solution 2.5 mg  2.5 mg Nebulization Q4H PRN Kathie Dike, MD      . aspirin EC tablet 81 mg  81 mg Oral Daily Kathie Dike, MD   81 mg at 03/30/16 1004  . atorvastatin (LIPITOR) tablet 40 mg  40 mg Oral q1800 Herminio Commons, MD   40 mg at 03/31/16 1830  . bisacodyl (DULCOLAX) EC tablet 5 mg  5 mg Oral Daily PRN Lendon Colonel, NP   5 mg at 03/30/16 1004  . carvedilol (COREG) tablet 25 mg  25 mg Oral BID WC Kathie Dike, MD   25 mg at 03/31/16 1830  . furosemide (LASIX) tablet 40 mg  40 mg Oral Daily Wellington Hampshire, MD   40 mg at 03/31/16 1600  . guaiFENesin (MUCINEX) 12 hr tablet 600 mg  600 mg Oral BID Kathie Dike, MD   600 mg at 03/31/16 2244  . heparin ADULT infusion 100 units/mL (25000 units/250 mL)   1,350 Units/hr Intravenous Continuous Leodis Sias, RPH 13.5 mL/hr at 04/01/16 0635 1,350 Units/hr at 04/01/16 0635  . lisinopril (PRINIVIL,ZESTRIL) tablet 40 mg  40 mg Oral Daily Kathie Dike, MD   40 mg at 03/31/16 0852  . LORazepam (ATIVAN) tablet 1 mg  1 mg Oral TID PRN Kathie Dike, MD   1 mg at 03/31/16 2317  . nicotine (NICODERM CQ - dosed in mg/24 hours) patch 21 mg  21 mg Transdermal Daily Kathie Dike, MD   21 mg at 03/31/16 0853  . nitroGLYCERIN (NITROGLYN) 2 % ointment 1 inch  1 inch Topical Q6H Theressa Millard, MD   1 inch at 04/01/16 [phone]  . ondansetron (ZOFRAN) injection 4 mg  4 mg Intravenous Q6H PRN Kathie Dike, MD      . oxyCODONE-acetaminophen (PERCOCET/ROXICET) 5-325 MG per tablet 1-2 tablet  1-2 tablet Oral Q6H PRN Kathie Dike, MD   1 tablet at 03/31/16 2317  . potassium chloride SA (K-DUR,KLOR-CON) CR tablet 20 mEq  20 mEq Oral Daily Kathie Dike, MD   20 mEq at 03/31/16 0853  . predniSONE (DELTASONE) tablet 20 mg  20 mg Oral Q breakfast Kathie Dike, MD   20 mg at 03/31/16 0853  . rOPINIRole (  REQUIP) tablet 0.25-0.5 mg  0.25-0.5 mg Oral QHS PRN Kathie Dike, MD      . sodium chloride flush (NS) 0.9 % injection 3 mL  3 mL Intravenous Q12H Kathie Dike, MD   3 mL at 03/31/16 2245  . sodium chloride flush (NS) 0.9 % injection 3 mL  3 mL Intravenous PRN Kathie Dike, MD      . sodium chloride flush (NS) 0.9 % injection 3 mL  3 mL Intravenous Q12H Wellington Hampshire, MD   0 mL at 03/31/16 2229  . sodium chloride flush (NS) 0.9 % injection 3 mL  3 mL Intravenous PRN Wellington Hampshire, MD        Physical Exam: General appearance: alert and no distress Lungs: diminished breath sounds bilaterally and wheezes faint, bilateral expiratory Heart: irregularly irregular rhythm Extremities: extremities normal, atraumatic, no cyanosis or edema Neurologic: Grossly normal  Lab Results: Results for orders placed or performed during the hospital encounter of 03/29/16  (from the past 48 hour(s))  Protime-INR     Status: None   Collection Time: 03/30/16  6:05 PM  Result Value Ref Range   Prothrombin Time 14.9 11.6 - 15.2 seconds   INR 1.16 0.00 - 1.49  CBC     Status: Abnormal   Collection Time: 04/01/16  3:51 AM  Result Value Ref Range   WBC 10.6 (H) 4.0 - 10.5 K/uL   RBC 3.19 (L) 4.22 - 5.81 MIL/uL   Hemoglobin 9.8 (L) 13.0 - 17.0 g/dL   HCT 30.0 (L) 39.0 - 52.0 %   MCV 94.0 78.0 - 100.0 fL   MCH 30.7 26.0 - 34.0 pg   MCHC 32.7 30.0 - 36.0 g/dL   RDW 14.1 11.5 - 15.5 %   Platelets 256 150 - 400 K/uL  Basic metabolic panel     Status: Abnormal   Collection Time: 04/01/16  3:51 AM  Result Value Ref Range   Sodium 136 135 - 145 mmol/L   Potassium 3.2 (L) 3.5 - 5.1 mmol/L   Chloride 96 (L) 101 - 111 mmol/L   CO2 31 22 - 32 mmol/L   Glucose, Bld 97 65 - 99 mg/dL   BUN 24 (H) 6 - 20 mg/dL   Creatinine, Ser 0.72 0.61 - 1.24 mg/dL   Calcium 8.4 (L) 8.9 - 10.3 mg/dL   GFR calc non Af Amer >60 >60 mL/min   GFR calc Af Amer >60 >60 mL/min    Comment: (NOTE) The eGFR has been calculated using the CKD EPI equation. This calculation has not been validated in all clinical situations. eGFR's persistently <60 mL/min signify possible Chronic Kidney Disease.    Anion gap 9 5 - 15    Imaging: No results found.  Assessment:  1. Active Problems: 2.   Essential hypertension 3.   SOB (shortness of breath) 4.   Acute CHF (congestive heart failure) ([place]) 5.   COPD (chronic obstructive pulmonary disease) (HCC) 6.   Elevated troponin 7.   Restless leg syndrome 8.   Pulmonary nodule, right 9.   Non-STEMI (non-ST elevated myocardial infarction) ([place]) 10.   Atrial flutter ([place]) 11.   Pericardial effusion 12.   Plan:  1. Elevated troponin: cath yesterday with non-obstructive disease. 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 EF appears to be improving, now at 45% with apical akinesis by ventriculogram. Will continue aspirin. Hold off on DAPT  due to need for NOAC.  2. Elevated D-Dimer: CT angio negative for PE.  3. Incidental pulmonary nodule:  seen on CT angio. RUL nodule suspicious for bronchogenic carcinoma. This will require further work up at some point. Will consult pulmonary to establish care prior to discharge.  4. Acute on chronic systolic CHF: EF previously normal. Now 35-40%. CXR and CTA with pulmonary vascular congestion. BNP elevated. Started on IV diuresis w/ Lasix 28m BID. Net neg 2.23L. Continue Coreg and lisinopril. Change lasix to 40 mg po daily today.   5. Pericardial effusion: small to moderate on 2D ECHO w/ no definitive tamponade physiology. Will need repeat echo in 1 month for re-evaluation of effusion size.  6. Atrial flutter: ECG on 03/29/16 noted to be in atrial flutter. This would be a new diagnosis and is paroxysmal. Now AV paced. CHADSVASC score of 2 (CHF and HTN.) Currently on IV heparin. D/c heparin and switch to Xarelto 20 mg daily.  7. HTN: BP currently well controlled. Hydralazine discontinued due to hypotension.  8. COPD: stable.   9. Tobacco abuse/alcohol abuse: he needs to quit smoking and etoh use Continue nicotine patch  10. Leukocytosis: likely related to steroids for COPD flare.  Ok to d/c home today. Follow-up with Dr. KBronson Ingor Dr. GCristopher Peru  Time Spent Directly with Patient:  15 minutes  Length of Stay:  LOS: 3 days   KPixie Casino MD, FFourth Corner Neurosurgical Associates Inc Ps Dba Cascade Outpatient Spine CenterAttending Cardiologist CFair Play4/27/2017, 7:59 AM

## 2016-04-01 NOTE — Progress Notes (Signed)
Notified K. Grandville Silos PA about patients rhythm and am lab result of K of 3.2. Orders given for 40 meq po potassium to be given. Explained to patient need for replacement and medication given.

## 2016-04-01 NOTE — Telephone Encounter (Signed)
04/01/16 Line with fast busy signal-cc

## 2016-04-01 NOTE — Consult Note (Signed)
Name: Shaun May MRN: [id number] DOB: 02/13/1951    ADMISSION DATE:  03/29/2016 CONSULTATION DATE:  4/27  REFERRING MD :  Hilty  CHIEF COMPLAINT:  Lung nodule   BRIEF PATIENT DESCRIPTION: 65yo male smoker with hx HTN, complex heart block s/p pacemaker, COPD, ETOH abuse with recent admission (d/c 4/23) for AECOPD.  He returned 4/24 with SOB, cough and wheezing.  W/u revealed mildly elevated troponin and echo showed newly reduced LVEF to 35-40%.  He was admitted by cardiology and underwent L heart cath 4/26 which showed no significant CAD and working dx is currently Takasubo cardiomyopathy.  Course c/b new onset PAF, now on heparin gtt, transitioning to xarelto.  During initial eval,  CTA chest was neg for PE but did reveal incidental finding of RUL pulmonary nodule and PCCM consulted.   SIGNIFICANT EVENTS    STUDIES:  CTA chest 4/24>>> 17 mm spiculated posterior right upper lobe nodule L heart cath 4/26>>>1.no evidence of obstructive coronary artery disease. Left dominant system with very short left main. 2. High normal left ventricular end-diastolic pressure.   3. Mildly reduced LV systolic function with an ejection fraction of 45% with apical akinesis.  HISTORY OF PRESENT ILLNESS:  65yo male smoker with hx HTN, complex heart block s/p pacemaker, COPD, ETOH abuse with recent admission (d/c 4/23) for AECOPD.  He returned 4/24 with SOB, cough and wheezing.  W/u revealed mildly elevated troponin and echo showed newly reduced LVEF to 35-40%.  He was admitted by cardiology and underwent L heart cath 4/26 which showed no significant CAD and working dx is currently Takasubo cardiomyopathy.  During initial eval,  CTA chest was neg for PE but did reveal incidental finding of RUL pulmonary nodule and PCCM consulted.   Currently sitting up in room, comfortable.  Denies SOB, chest pain.   Denies cough currently, no hemoptysis, no purulent sputum, no BLE edema.  Denies recent weight loss.     Current smoker -- 94 pack year hx.  Began at age 8, approx 2 ppd since.     PAST MEDICAL HISTORY :   has a past medical history of Hypertension; Hemochromatosis; and Mobitz type 2 second degree heart block.  has past surgical history that includes Cardiac catheterization (N/A, 10/28/2015); Cardiac catheterization (N/A, 11/07/2015); and Cardiac catheterization (N/A, 03/31/2016). Prior to Admission medications   Medication Sig Start Date End Date Taking? Authorizing Provider  azithromycin (ZITHROMAX) 250 MG tablet Take 1 tablet (250 mg total) by mouth daily. 03/28/16  Yes Kathie Dike, MD  carvedilol (COREG) 25 MG tablet Take 25 mg by mouth 2 (two) times daily with a meal.  01/23/16  Yes Historical Provider, MD  guaiFENesin (MUCINEX) 600 MG 12 hr tablet Take 1 tablet (600 mg total) by mouth 2 (two) times daily. 03/28/16  Yes Kathie Dike, MD  hydrALAZINE (APRESOLINE) 100 MG tablet Take 50 mg by mouth 3 (three) times daily.  01/23/16  Yes Historical Provider, MD  lisinopril (PRINIVIL,ZESTRIL) 40 MG tablet Take 40 mg by mouth daily.   Yes Historical Provider, MD  LORazepam (ATIVAN) 2 MG tablet Take 1 tablet by mouth every 6 (six) hours as needed for anxiety.  10/01/15  Yes Historical Provider, MD  nicotine (NICODERM CQ - DOSED IN MG/24 HOURS) 21 mg/24hr patch Place 1 patch (21 mg total) onto the skin daily. 03/28/16  Yes Kathie Dike, MD  oxyCODONE-acetaminophen (PERCOCET/ROXICET) 5-325 MG tablet Take 1-2 tablets by mouth every 6 (six) hours as needed. Patient taking differently: Take 1-2  tablets by mouth every 6 (six) hours as needed for moderate pain.  01/20/16  Yes Kristen N Ward, DO  potassium chloride SA (K-DUR,KLOR-CON) 20 MEQ tablet Take 1 tablet by mouth daily. 10/14/15  Yes Historical Provider, MD  predniSONE (DELTASONE) 10 MG tablet Take '40mg'$  po daily for 2 days then '30mg'$  daily for 2 days then '20mg'$  daily for 2 days then '10mg'$  daily for 2 days then stop 03/28/16  Yes Kathie Dike, MD   PROAIR HFA 108 [phone] Base) MCG/ACT inhaler Inhale 2 puffs into the lungs every 4 (four) hours as needed for wheezing or shortness of breath. 03/28/16  Yes Kathie Dike, MD  rOPINIRole (REQUIP) 0.25 MG tablet Take 1-2 tablets by mouth at bedtime as needed (restless leg).  02/25/16  Yes Historical Provider, MD   No Known Allergies  FAMILY HISTORY:  family history includes Diabetes in his other. SOCIAL HISTORY:  reports that he has been smoking Cigarettes.  He has been smoking about 2.50 packs per day. He does not have any smokeless tobacco history on file. He reports that he drinks about 12.6 oz of alcohol per week. He reports that he does not use illicit drugs.  REVIEW OF SYSTEMS:   As per HPI - All other systems reviewed and were neg.    SUBJECTIVE:   VITAL SIGNS: Temp:  [97.7 F (36.5 C)-98.9 F (37.2 C)] 97.8 F (36.6 C) (04/27 0751) Pulse Rate:  [0-237] 64 (04/27 0429) Resp:  [0-34] 17 (04/27 0429) BP: (94-128)/(49-75) 128/66 mmHg (04/27 0429) SpO2:  [0 %-98 %] 92 % (04/27 0429) Weight:  [80.8 kg (178 lb 2.1 oz)] 80.8 kg (178 lb 2.1 oz) (04/27 0429)  PHYSICAL EXAMINATION: General:  Pleasant male, NAD  Neuro:  Awake, alert, appropriate, MAE  HEENT:  Mm moist, no JVD, no LA  Cardiovascular:  s1s2 rrr Lungs:  resps even non labored on RA, no audible wheeze  Abdomen:  Soft, non tender, +bs  Musculoskeletal:  Warm and dry, no edema    Recent Labs Lab 03/29/16 0807 03/30/16 0539 04/01/16 0351  NA 136 136 136  K 3.8 3.6 3.2*  CL 99* 98* 96*  CO2 '27 30 31  '$ BUN 20 30* 24*  CREATININE 0.76 0.75 0.72  GLUCOSE 117* 115* 97    Recent Labs Lab 03/29/16 0807 03/30/16 0539 04/01/16 0351  HGB 13.3 11.3* 9.8*  HCT 40.2 34.2* 30.0*  WBC 19.6* 13.6* 10.6*  PLT 269 257 256   No results found.  ASSESSMENT / PLAN:   Pulmonary nodule -- 10m RUL spiculated nodule  Tobacco abuse -- 94 pack year smoking hx  COPD  Takasubo cardiomyopathy  PAF - on xarelto   REC -  For  d/c home today per cards  Smoking cessation !! Counseled extensively.  PRN BD  Will schedule outpt pulm f/u -- will likely need ENB bx of RUL nodule once cardiac issues improved and ok with cards to hold anticoagulation.    Dr. AElsworth Sohoto follow.     KNickolas Madrid NP 04/01/2016  9:54 AM Pager: [phone]

## 2016-04-01 NOTE — Telephone Encounter (Signed)
See TE regarding contacting patient dated 04/01/16

## 2016-04-01 NOTE — Care Management Note (Signed)
Case Management Note  Patient Details  Name: Shaun May MRN: [id number] Date of Birth: 12-29-1950  Subjective/Objective:   Patient is from home, pt indep, will be on xarelto, co pay is $20, NCM gave patient 30 day saving card and $0 co pay card for 12 months. Patient goes to [facility], they do have Cataract in stock.  Patient for dc today.                 Action/Plan:   Expected Discharge Date:                  Expected Discharge Plan:  Home/Self Care  In-House Referral:  NA  Discharge planning Services  CM Consult  Post Acute Care Choice:  NA Choice offered to:  NA  DME Arranged:    DME Agency:     HH Arranged:    HH Agency:     Status of Service:  Completed, signed off  Medicare Important Message Given:    Date Medicare IM Given:    Medicare IM give by:    Date Additional Medicare IM Given:    Additional Medicare Important Message give by:     If discussed at [place] of Stay Meetings, dates discussed:    Additional Comments:  [name], RN 04/01/2016, 5:56 PM

## 2016-04-01 NOTE — Telephone Encounter (Signed)
Orders entered per Dr. Bari Mantis request. Patient still in hospital will have tests done upon release tomorrow. Nothing further needed.

## 2016-04-01 NOTE — Telephone Encounter (Signed)
-----   Message from Rigoberto Noel, MD sent at 04/01/2016  2:10 PM EDT ----- Pl contact ----- Message -----    From: Glean Hess, CMA    Sent: 04/01/2016   1:44 PM      To: Rigoberto Noel, MD  Orders have been entered.  Do I need to contact this patient or is he aware already that we are scheduling these tests?  Please advise. ----- Message -----    From: Rigoberto Noel, MD    Sent: 04/01/2016  12:26 PM      To: Glean Hess, CMA  Lisa placed orders for outpatient testing tomorrow He will get discharged today -PET scan -PFTs  RA

## 2016-04-01 NOTE — Telephone Encounter (Signed)
-----   Message from Rigoberto Noel, MD sent at 04/01/2016 12:26 PM EDT ----- Lattie Haw placed orders for outpatient testing tomorrow He will get discharged today -PET scan -PFTs  RA

## 2016-04-02 NOTE — Telephone Encounter (Signed)
Patient contacted regarding discharge from [facility] on 04/01/16.  Patient understands to follow up with provider Dr Bronson Ing on 04/09/16 at [facility] [place]. Patient understands discharge instructions? yes Patient understands medications and regiment? yes Patient understands to bring all medications to this visit? yes  Wife states patient is glad to be home .

## 2016-04-07 ENCOUNTER — Ambulatory Visit (HOSPITAL_COMMUNITY)
Admit: 2016-04-07 | Discharge: 2016-04-07 | Disposition: A | Discharge status: Home / Self Care | Payer: BLUE CROSS/BLUE SHIELD | Source: Ambulatory Visit | Attending: Pulmonary Disease | Admitting: Pulmonary Disease

## 2016-04-07 ENCOUNTER — Encounter (HOSPITAL_COMMUNITY)
Admit: 2016-04-07 | Discharge: 2016-04-07 | Disposition: A | Discharge status: Home / Self Care | Payer: BLUE CROSS/BLUE SHIELD | Attending: Pulmonary Disease | Admitting: Pulmonary Disease

## 2016-04-07 ENCOUNTER — Telehealth: Payer: Self-pay | Admitting: Cardiovascular Disease

## 2016-04-07 DIAGNOSIS — R911 Solitary pulmonary nodule: Secondary | ICD-10-CM

## 2016-04-07 DIAGNOSIS — I313 Pericardial effusion (noninflammatory): Secondary | ICD-10-CM | POA: Insufficient documentation

## 2016-04-07 DIAGNOSIS — J449 Chronic obstructive pulmonary disease, unspecified: Secondary | ICD-10-CM | POA: Insufficient documentation

## 2016-04-07 LAB — PULMONARY FUNCTION TEST
DL/VA % PRED: 58 %
DL/VA: 2.71 ml/min/mmHg/L
DLCO COR % PRED: 32 %
DLCO COR: 10.44 ml/min/mmHg
DLCO unc % pred: 26 %
DLCO unc: 8.68 ml/min/mmHg
FEF 25-75 Post: 0.46 L/sec
FEF 25-75 Pre: 0.32 L/sec
FEF2575-%CHANGE-POST: 43 %
FEF2575-%Pred-Post: 16 %
FEF2575-%Pred-Pre: 11 %
FEV1-%CHANGE-POST: 11 %
FEV1-%Pred-Post: 30 %
FEV1-%Pred-Pre: 27 %
FEV1-POST: 1.04 L
FEV1-Pre: 0.94 L
FEV1FVC-%CHANGE-POST: 0 %
FEV1FVC-%Pred-Pre: 58 %
FEV6-%Change-Post: 10 %
FEV6-%PRED-PRE: 42 %
FEV6-%Pred-Post: 47 %
FEV6-POST: 2.07 L
FEV6-PRE: 1.87 L
FEV6FVC-%Change-Post: -1 %
FEV6FVC-%PRED-POST: 91 %
FEV6FVC-%Pred-Pre: 92 %
FVC-%CHANGE-POST: 12 %
FVC-%PRED-POST: 51 %
FVC-%PRED-PRE: 45 %
FVC-POST: 2.39 L
FVC-PRE: 2.13 L
POST FEV6/FVC RATIO: 87 %
PRE FEV6/FVC RATIO: 88 %
Post FEV1/FVC ratio: 44 %
Pre FEV1/FVC ratio: 44 %
RV % pred: 168 %
RV: 3.93 L
TLC % PRED: 88 %
TLC: 6.23 L

## 2016-04-07 LAB — GLUCOSE, CAPILLARY: Glucose-Capillary: 107 mg/dL — ABNORMAL HIGH (ref 65–99)

## 2016-04-07 MED ORDER — ALBUTEROL SULFATE (2.5 MG/3ML) 0.083% IN NEBU
2.5000 mg | INHALATION_SOLUTION | Freq: Once | RESPIRATORY_TRACT | Status: AC
  Administered 2016-04-07: 2.5 mg via RESPIRATORY_TRACT

## 2016-04-07 MED ORDER — FLUDEOXYGLUCOSE F - 18 (FDG) INJECTION
9.8000 | Freq: Once | INTRAVENOUS | Status: AC | PRN
  Administered 2016-04-07: 9.8 via INTRAVENOUS

## 2016-04-07 NOTE — Telephone Encounter (Signed)
Patient is aware of his appointments and has already arrived today to have PFT done. Will have PET done at 2pm today. Nothing further needed.

## 2016-04-07 NOTE — Telephone Encounter (Signed)
Ok to do so

## 2016-04-07 NOTE — Telephone Encounter (Signed)
Will defer to Dr Bronson Ing

## 2016-04-07 NOTE — Telephone Encounter (Signed)
Pt was an in pt at [facility] and saw Nell Range, she wrote him a script for breathing treatments but did not write one for the Nebulizer, he's wondering if Dr. Bronson Ing would send the Rx to [facility]. He has all the medicine but can't use them

## 2016-04-08 ENCOUNTER — Ambulatory Visit (INDEPENDENT_AMBULATORY_CARE_PROVIDER_SITE_OTHER): Payer: BLUE CROSS/BLUE SHIELD | Admitting: Adult Health

## 2016-04-08 ENCOUNTER — Telehealth: Payer: Self-pay | Admitting: Cardiovascular Disease

## 2016-04-08 ENCOUNTER — Encounter: Payer: Self-pay | Admitting: Adult Health

## 2016-04-08 VITALS — BP 86/54 | HR 87 | Ht 70.0 in | Wt 176.0 lb

## 2016-04-08 DIAGNOSIS — Z79899 Other long term (current) drug therapy: Secondary | ICD-10-CM

## 2016-04-08 DIAGNOSIS — I952 Hypotension due to drugs: Secondary | ICD-10-CM | POA: Diagnosis not present

## 2016-04-08 NOTE — Progress Notes (Signed)
Name: Shaun May    DOB: 27-Aug-1951  Age: 65 y.o.  MR#: [id number]       PCP:  Wende Neighbors, MD      Insurance: Payor: [place] / Plan: BCBS OTHER / Product Type: *No Product type* /   CC:    Chief Complaint  Patient presents with  . Hypotension    VS Filed Vitals:   04/08/16 1506  BP: 86/54  Pulse: 87  Height: '5\' 10"'$  (1.778 m)  Weight: 176 lb (79.833 kg)  SpO2: 97%    Weights Current Weight  04/08/16 176 lb (79.833 kg)  04/01/16 178 lb 2.1 oz (80.8 kg)  03/28/16 187 lb 1.6 oz (84.868 kg)    Blood Pressure  BP Readings from Last 3 Encounters:  04/08/16 86/54  04/01/16 156/75  03/28/16 157/78     Admit date:  (Not on file) Last encounter with RMR:  Visit date not found   Allergy Review of patient's allergies indicates no known allergies.  Current Outpatient Prescriptions  Medication Sig Dispense Refill  . albuterol (PROVENTIL) (2.5 MG/3ML) 0.083% nebulizer solution Take 3 mLs (2.5 mg total) by nebulization every 4 (four) hours as needed for wheezing or shortness of breath. 75 mL 12  . aspirin EC 81 MG EC tablet Take 1 tablet (81 mg total) by mouth daily.    Marland Kitchen atorvastatin (LIPITOR) 40 MG tablet Take 1 tablet (40 mg total) by mouth daily at 6 PM. 30 tablet 6  . carvedilol (COREG) 25 MG tablet Take 25 mg by mouth 2 (two) times daily with a meal.   11  . furosemide (LASIX) 40 MG tablet Take 1 tablet (40 mg total) by mouth daily. 30 tablet 6  . guaiFENesin (MUCINEX) 600 MG 12 hr tablet Take 1 tablet (600 mg total) by mouth 2 (two) times daily. 30 tablet 0  . lisinopril (PRINIVIL,ZESTRIL) 40 MG tablet Take 40 mg by mouth daily.    Marland Kitchen LORazepam (ATIVAN) 2 MG tablet Take 1 tablet by mouth every 6 (six) hours as needed for anxiety.     . nicotine (NICODERM CQ - DOSED IN MG/24 HOURS) 21 mg/24hr patch Place 1 patch (21 mg total) onto the skin daily. 28 patch 0  . oxyCODONE-acetaminophen (PERCOCET/ROXICET) 5-325 MG tablet Take 1-2 tablets by mouth every 6 (six) hours  as needed. (Patient taking differently: Take 1-2 tablets by mouth every 6 (six) hours as needed for moderate pain. ) 15 tablet 0  . potassium chloride SA (K-DUR,KLOR-CON) 20 MEQ tablet Take 1 tablet by mouth daily.    . predniSONE (DELTASONE) 10 MG tablet Take '40mg'$  po daily for 2 days then '30mg'$  daily for 2 days then '20mg'$  daily for 2 days then '10mg'$  daily for 2 days then stop 20 tablet 0  . PROAIR HFA 108 (90 Base) MCG/ACT inhaler Inhale 2 puffs into the lungs every 4 (four) hours as needed for wheezing or shortness of breath. 1 Inhaler 0  . rivaroxaban (XARELTO) 20 MG TABS tablet Take 1 tablet (20 mg total) by mouth daily with supper. 30 tablet 11  . rOPINIRole (REQUIP) 0.25 MG tablet Take 1-2 tablets by mouth at bedtime as needed (restless leg).   1   No current facility-administered medications for this visit.    Discontinued Meds:   There are no discontinued medications.  Patient Active Problem List   Diagnosis Date Noted  . Atrial flutter ([place]), paroxysmal   . Pericardial effusion   . Acute on chronic  systolic (congestive) heart failure ([place]) 03/29/2016  . COPD (chronic obstructive pulmonary disease) ([place]) 03/29/2016  . Elevated troponin 03/29/2016  . Restless leg syndrome 03/29/2016  . Pulmonary nodule, right 03/29/2016  . Chest pain 03/27/2016  . COPD exacerbation ([place]) 03/27/2016  . Hypertension   . Essential hypertension   . Bradycardia 10/25/2015  . Heart block AV second degree 10/25/2015  . Accelerated hypertension 10/25/2015  . Hemochromatosis 10/25/2015  . Tobacco abuse 10/25/2015  . Alcohol dependence ([place]) 10/25/2015  . Second degree AV block 10/25/2015    LABS    Component Value Date/Time   NA 136 04/01/2016 0351   NA 136 03/30/2016 0539   NA 136 03/29/2016 0807   K 3.2* 04/01/2016 0351   K 3.6 03/30/2016 0539   K 3.8 03/29/2016 0807   CL 96* 04/01/2016 0351   CL 98* 03/30/2016 0539   CL 99* 03/29/2016 0807   CO2 31 04/01/2016 0351   CO2 30 03/30/2016  0539   CO2 27 03/29/2016 0807   GLUCOSE 97 04/01/2016 0351   GLUCOSE 115* 03/30/2016 0539   GLUCOSE 117* 03/29/2016 0807   BUN 24* 04/01/2016 0351   BUN 30* 03/30/2016 0539   BUN 20 03/29/2016 0807   CREATININE 0.72 04/01/2016 0351   CREATININE 0.75 03/30/2016 0539   CREATININE 0.76 03/29/2016 0807   CALCIUM 8.4* 04/01/2016 0351   CALCIUM 8.8* 03/30/2016 0539   CALCIUM 9.1 03/29/2016 0807   GFRNONAA >60 04/01/2016 0351   GFRNONAA >60 03/30/2016 0539   GFRNONAA >60 03/29/2016 0807   GFRAA >60 04/01/2016 0351   GFRAA >60 03/30/2016 0539   GFRAA >60 03/29/2016 0807   CMP     Component Value Date/Time   NA 136 04/01/2016 0351   K 3.2* 04/01/2016 0351   CL 96* 04/01/2016 0351   CO2 31 04/01/2016 0351   GLUCOSE 97 04/01/2016 0351   BUN 24* 04/01/2016 0351   CREATININE 0.72 04/01/2016 0351   CALCIUM 8.4* 04/01/2016 0351   PROT 7.6 03/29/2016 0807   ALBUMIN 3.8 03/29/2016 0807   AST 132* 03/29/2016 0807   ALT 105* 03/29/2016 0807   ALKPHOS 73 03/29/2016 0807   BILITOT 1.5* 03/29/2016 0807   GFRNONAA >60 04/01/2016 0351   GFRAA >60 04/01/2016 0351       Component Value Date/Time   WBC 10.6* 04/01/2016 0351   WBC 13.6* 03/30/2016 0539   WBC 19.6* 03/29/2016 0807   HGB 9.8* 04/01/2016 0351   HGB 11.3* 03/30/2016 0539   HGB 13.3 03/29/2016 0807   HCT 30.0* 04/01/2016 0351   HCT 34.2* 03/30/2016 0539   HCT 40.2 03/29/2016 0807   MCV 94.0 04/01/2016 0351   MCV 95.3 03/30/2016 0539   MCV 95.3 03/29/2016 0807    Lipid Panel  No results found for: CHOL, TRIG, HDL, CHOLHDL, VLDL, LDLCALC, LDLDIRECT  ABG No results found for: PHART, PCO2ART, PO2ART, HCO3, TCO2, ACIDBASEDEF, O2SAT   Lab Results  Component Value Date   TSH 0.514 03/29/2016   BNP (last 3 results)  Recent Labs  10/25/15 1420 03/27/16 0145 03/29/16 0807  BNP 95.0 138.0* 488.0*    ProBNP (last 3 results) No results for input(s): PROBNP in the last 8760 hours.  Cardiac Panel (last 3 results) No  results for input(s): CKTOTAL, CKMB, TROPONINI, RELINDX in the last 72 hours.  Iron/TIBC/Ferritin/ %Sat    Component Value Date/Time   FERRITIN 275 10/26/2015 0905     EKG Orders placed or performed during the hospital encounter of 03/29/16  .  ED EKG  . ED EKG  . EKG 12-Lead  . EKG 12-Lead     Prior Assessment and Plan Problem List as of 04/08/2016      Cardiovascular and Mediastinum   Heart block AV second degree   Accelerated hypertension   Second degree AV block   Hypertension   Essential hypertension   Acute on chronic systolic (congestive) heart failure (HCC)   Atrial flutter (HCC), paroxysmal   Pericardial effusion     Respiratory   COPD exacerbation (HCC)   COPD (chronic obstructive pulmonary disease) (HCC)     Other   Bradycardia   Hemochromatosis   Tobacco abuse   Alcohol dependence (HCC)   Chest pain   Elevated troponin   Restless leg syndrome   Pulmonary nodule, right       Imaging: Dg Chest 2 View  03/29/2016  CLINICAL DATA:  Shortness of breath.  COPD exacerbation. EXAM: CHEST  2 VIEW COMPARISON:  03/28/2016 FINDINGS: mild hyperinflation. moderate thoracic spondylosis. Prominent upper thoracic osteophytes, causing increased density over the spine on the lateral view. Pacer with leads at right atrium and right ventricle. No lead discontinuity. Midline trachea. Mild cardiomegaly with transverse aortic atherosclerosis. Trace bilateral pleural effusions. Mild pulmonary venous congestion. No lobar consolidation. No pneumothorax. IMPRESSION: Cardiomegaly and mild pulmonary venous congestion. Probable trace bilateral pleural effusions. Electronically Signed   By: Abigail Miyamoto M.D.   On: 03/29/2016 08:17   Dg Chest 2 View  03/28/2016  CLINICAL DATA:  Shortness of breath EXAM: CHEST  2 VIEW COMPARISON:  03/27/2016 FINDINGS: Lungs are clear. Small bilateral pleural effusions. No pneumothorax. Stable cardiomegaly.  Left subclavian pacemaker. Degenerative changes of  the visualized thoracolumbar spine. IMPRESSION: Small bilateral pleural effusions. Electronically Signed   By: Julian Hy M.D.   On: 03/28/2016 11:11   Dg Chest 2 View  03/27/2016  CLINICAL DATA:  Chest pain and shortness of breath for 6 hours. EXAM: CHEST  2 VIEW COMPARISON:  10/29/2015 FINDINGS: Dual lead left-sided pacemaker in place. The ventricular lead is no slightly tortuous. There is cardiomegaly that is new from prior exam. No pulmonary edema. No confluent airspace disease. No pleural effusion or pneumothorax. There is degenerative change in the spine. IMPRESSION: 1. Cardiomegaly, new from exam 5 months prior. The ventricular lead of the dual lead pacemaker is tortuous. 2. No pulmonary edema or localizing process. Electronically Signed   By: Jeb Levering M.D.   On: 03/27/2016 02:44   Ct Angio Chest Pe W/cm &/or Wo Cm  03/29/2016  CLINICAL DATA:  [facility] called out for sob. Pt was discharged yesterday from here on April 22 nd for COPD exacerbation. Pt reports feeling sob since being discharged but pt got worse during the night tonight EXAM: CT ANGIOGRAPHY CHEST WITH CONTRAST TECHNIQUE: Multidetector CT imaging of the chest was performed using the standard protocol during bolus administration of intravenous contrast. Multiplanar CT image reconstructions and MIPs were obtained to evaluate the vascular anatomy. CONTRAST:  118m OMNIPAQUE IOHEXOL 350 MG/ML SOLN COMPARISON:  03/14/2008 FINDINGS: Vascular: Left subclavian pacemaker leads extend to the right atrium and right ventricular apex. Right arm IV contrast injection. The SVC is patent. Mild right atrial enlargement. The right ventricle is nondilated. Mild dilatation of central pulmonary arteries. Patent bilateral pulmonary veins drain into the left atrium. Scattered coronary calcifications. Adequate contrast opacification of the thoracic aorta with no evidence of dissection, aneurysm, or stenosis. There is classic 3-vessel brachiocephalic  arch anatomy without proximal stenosis. Scattered calcifications in  the arch and descending thoracic segment. Visualized suprarenal abdominal aorta shows minimal atheromatous plaque. Mediastinum/Lymph Nodes: No masses or pathologically enlarged lymph nodes identified. Moderate pericardial effusion. Lungs/Pleura: Bilateral small pleural effusions. Pulmonary emphysema. 17 mm spiculated nodule in posterior segment right upper lobe image 37/5. Subsegmental dependent atelectasis posteriorly in both lower lobes. Upper abdomen: No acute findings. Musculoskeletal: Flowing osteophytes across multiple contiguous levels in the mid and lower thoracic spine. Sternum intact. Review of the MIP images confirms the above findings. IMPRESSION: 1. Negative for acute PE or thoracic aortic dissection. 2. Pericardial and bilateral pleural effusions. 3. 17 mm spiculated posterior right upper lobe nodule suggesting primary bronchogenic carcinoma. Consider thoracic surgical consultation. 4. Atherosclerosis, including aortic and coronary artery disease. Please note that although the presence of coronary artery calcium documents the presence of coronary artery disease, the severity of this disease and any potential stenosis cannot be assessed on this non-gated CT examination. Assessment for potential risk factor modification, dietary therapy or pharmacologic therapy may be warranted, if clinically indicated. Electronically Signed   By: Lucrezia Europe M.D.   On: 03/29/2016 13:32   Nm Pet Image Initial (pi) Skull Base To Thigh  04/07/2016  CLINICAL DATA:  Initial treatment strategy for pulmonary nodule . EXAM: NUCLEAR MEDICINE PET SKULL BASE TO THIGH TECHNIQUE: 9.8 mCi F-18 FDG was injected intravenously. Full-ring PET imaging was performed from the skull base to thigh after the radiotracer. CT data was obtained and used for attenuation correction and anatomic localization. FASTING BLOOD GLUCOSE:  Value: 107 mg/dl COMPARISON:  None. FINDINGS:  NECK No hypermetabolic lymph nodes in the neck. CHEST 14.5 mm right upper lobe pulmonary nodule adjacent to the major fissure is hypermetabolic with SUV max of 6.7. This is consistent with neoplasm. No enlarged or metabolically active mediastinal or hilar lymph nodes are identified. No other pulmonary lesions are seen. There are underlying emphysematous changes. Moderate-sized pericardial effusion. ABDOMEN/PELVIS No abnormal hypermetabolic activity within the liver, pancreas, adrenal glands, or spleen. No hypermetabolic lymph nodes in the abdomen or pelvis. Additional findings include advanced atherosclerotic calcifications involving the aorta and iliac arteries. There is also a separate. Right renal cyst. Small amount free pelvic fluid of uncertain significance or etiology. SKELETON Diffuse osseous uptake but no focal lesions. IMPRESSION: 1. 14.5 mm right upper lobe pulmonary nodule is hypermetabolic and consistent with neoplasm. No enlarged or metabolically active mediastinal or hilar lymph nodes and no findings for metastatic disease. 2. Moderate-sized pericardial effusion. 3. Diffuse osseous uptake without focal lesion. Electronically Signed   By: Marijo Sanes M.D.   On: 04/07/2016 14:52

## 2016-04-08 NOTE — Telephone Encounter (Signed)
Pt left office before I could

## 2016-04-08 NOTE — Patient Instructions (Signed)
Medication Instructions:  STOP LISINOPRIL STOP LASIX   Labwork: Your physician recommends that you return for lab work in: [place] Tuesday    Testing/Procedures: NONE  Follow-Up: Your physician recommends that you schedule a follow-up appointment in: Tuesday    Any Other Special Instructions Will Be Listed Below (If Applicable). Keep a blood pressure log and bring it back with you on Tuesday     If you need a refill on your cardiac medications before your next appointment, please call your pharmacy.

## 2016-04-08 NOTE — Addendum Note (Signed)
Addended by: Debbora Lacrosse R on: 04/08/2016 03:37 PM   Modules accepted: Orders

## 2016-04-08 NOTE — Telephone Encounter (Signed)
Pt came into the office stating his BP is running low.

## 2016-04-08 NOTE — Telephone Encounter (Signed)
Cont: talk to him,wife reports low BP and fatigue since hospital discharge,requests apt today

## 2016-04-08 NOTE — Progress Notes (Signed)
Cardiology Office Note   Date:  04/08/2016   ID:  Shaun May, DOB 1951/07/22, MRN [id number]  PCP:  Wende Neighbors, MD  Cardiologist: Woodroe Chen, NP   Chief Complaint  Patient presents with  . Hypotension      History of Present Illness: Shaun May is a 65 y.o. male who presents for ongoing assessment and management of complete heart block, status post [employer] pacemaker placed November 2016, hypertension, COPD, history of hemochromatosis, ETOH and  tobacco abuse. He was recently admitted to the hospital with COPD exacerbation and worsening shortness of breath. His troponin was elevated 2-D echo revealed a new cardiomyopathy with wall motion abnormalities, in LAD distribution. Sent was sent to [facility] hospital for catheterization.  Cardiac catheterization revealed no evidence of obstructive coronary artery disease. Left dominant system with very short left main. High normal LVEDP, mildly reduced LV systolic function with EF of 45% with apical akinesis. It was felt that the patient's wall motion abnormality with suggestive of stress-induced cardiomyopathy ([place]). Echocardiogram revealed an EF of 35-40%.   The patient is added onto my schedule today for complaints of hypotension.I discharged from the hospital the patient's blood pressure was 156/75, pulse 64 with a temperature of 97.9. The patient weighedall 178 pounds.  Is a listless, no energy, weight is 176 pounds. Blood pressure 86/54. This is been calibrated with his home blood pressure machine. He denies chest pain, he has been having some dizziness. He has not had any presyncope.   Past Medical History  Diagnosis Date  . Hypertension   . Hemochromatosis   . Mobitz type 2 second degree heart block     PPM MDT 10/28/15 Dr. Lovena Le    Past Surgical History  Procedure Laterality Date  . Ep implantable device N/A 10/28/2015    Procedure: Pacemaker Implant;  Surgeon: Evans Lance, MD;  Location: [facility];  Service: Cardiovascular;  Laterality: N/A;  . Ep implantable device N/A 11/07/2015    Procedure: Pocket Revision;  Surgeon: Evans Lance, MD;  Location: [facility];  Service: Cardiovascular;  Laterality: N/A;  . Cardiac catheterization N/A 03/31/2016    Procedure: Left Heart Cath and Coronary Angiography;  Surgeon: Wellington Hampshire, MD;  Location: [facility];  Service: Cardiovascular;  Laterality: N/A;     Current Outpatient Prescriptions  Medication Sig Dispense Refill  . albuterol (PROVENTIL) (2.5 MG/3ML) 0.083% nebulizer solution Take 3 mLs (2.5 mg total) by nebulization every 4 (four) hours as needed for wheezing or shortness of breath. 75 mL 12  . aspirin EC 81 MG EC tablet Take 1 tablet (81 mg total) by mouth daily.    Marland Kitchen atorvastatin (LIPITOR) 40 MG tablet Take 1 tablet (40 mg total) by mouth daily at 6 PM. 30 tablet 6  . carvedilol (COREG) 25 MG tablet Take 25 mg by mouth 2 (two) times daily with a meal.   11  . furosemide (LASIX) 40 MG tablet Take 1 tablet (40 mg total) by mouth daily. 30 tablet 6  . guaiFENesin (MUCINEX) 600 MG 12 hr tablet Take 1 tablet (600 mg total) by mouth 2 (two) times daily. 30 tablet 0  . lisinopril (PRINIVIL,ZESTRIL) 40 MG tablet Take 40 mg by mouth daily.    Marland Kitchen LORazepam (ATIVAN) 2 MG tablet Take 1 tablet by mouth every 6 (six) hours as needed for anxiety.     . nicotine (NICODERM CQ - DOSED IN MG/24 HOURS) 21 mg/24hr patch  Place 1 patch (21 mg total) onto the skin daily. 28 patch 0  . oxyCODONE-acetaminophen (PERCOCET/ROXICET) 5-325 MG tablet Take 1-2 tablets by mouth every 6 (six) hours as needed. (Patient taking differently: Take 1-2 tablets by mouth every 6 (six) hours as needed for moderate pain. ) 15 tablet 0  . potassium chloride SA (K-DUR,KLOR-CON) 20 MEQ tablet Take 1 tablet by mouth daily.    . predniSONE (DELTASONE) 10 MG tablet Take '40mg'$  po daily for 2 days then '30mg'$  daily for 2 days then '20mg'$  daily for 2 days then '10mg'$  daily  for 2 days then stop 20 tablet 0  . PROAIR HFA 108 (90 Base) MCG/ACT inhaler Inhale 2 puffs into the lungs every 4 (four) hours as needed for wheezing or shortness of breath. 1 Inhaler 0  . rivaroxaban (XARELTO) 20 MG TABS tablet Take 1 tablet (20 mg total) by mouth daily with supper. 30 tablet 11  . rOPINIRole (REQUIP) 0.25 MG tablet Take 1-2 tablets by mouth at bedtime as needed (restless leg).   1   No current facility-administered medications for this visit.    Allergies:   Review of patient's allergies indicates no known allergies.    Social History:  The patient  reports that he has been smoking Cigarettes.  He has been smoking about 2.50 packs per day. He does not have any smokeless tobacco history on file. He reports that he drinks about 12.6 oz of alcohol per week. He reports that he does not use illicit drugs.   Family History:  The patient's family history includes Diabetes in his other.    ROS: All other systems are reviewed and negative. Unless otherwise mentioned in H&P    PHYSICAL EXAM: VS:  There were no vitals taken for this visit. , BMI There is no weight on file to calculate BMI. GEN: Well nourished, well developed, in no acute distress HEENT: normal Neck: no JVD, carotid bruits, or masses Cardiac: RRR; no murmurs, rubs, or gallops,no edema  Respiratory:  Clear to auscultation bilaterally, normal work of breathing GI: soft, nontender, nondistended, + BS MS: no deformity or atrophy Skin: warm and dry, no rash Neuro:  Strength and sensation are intact Psych: euthymic mood, full affect   Recent Labs: 10/28/2015: Magnesium 2.0 03/29/2016: ALT 105*; B Natriuretic Peptide 488.0*; TSH 0.514 04/01/2016: BUN 24*; Creatinine, Ser 0.72; Hemoglobin 9.8*; Platelets 256; Potassium 3.2*; Sodium 136    Lipid Panel No results found for: CHOL, TRIG, HDL, CHOLHDL, VLDL, LDLCALC, LDLDIRECT    Wt Readings from Last 3 Encounters:  04/01/16 178 lb 2.1 oz (80.8 kg)  03/28/16 187  lb 1.6 oz (84.868 kg)  02/13/16 180 lb (81.647 kg)     ASSESSMENT AND PLAN:  1.  Hypotension:I think he is on too much medication. His blood pressure corresponds to his home blood pressure readings. For now I'm going to take him off of Lasix and lisinopril. I have given him a copy of the blood pressure recording sheet. On the recording sheet I have given him parameters of 130/60-140/84 his blood pressure range target.I will see him back in 4 days. He will have a BMET prior to next office visit.  2. Systolic dysfunction:echocardiogram revealed an EF of 45% with evidence of stress-induced cardiomyopathy. He continues on carvedilol 25 mg twice a day. This may be all that he needs with the exception of a small low dose of lisinopril possibly 2.5 mg. We'll reevaluate this on next office visit.  3. History of atrial  flutter: he is on XARELTO. We'll check a CBC to evaluate further to ascertain if bleeding is causing hypotension.   Current medicines are reviewed at length with the patient today.    Labs/ tests ordered today include: CBC and BMET No orders of the defined types were placed in this encounter.     Disposition:   FU with 4 days.  Signed, Jory Sims, NP  04/08/2016 3:06 PM    [facility] [address] [place], [place] [address] Phone: [phone]; Fax: [phone]

## 2016-04-08 NOTE — Telephone Encounter (Signed)
rx written for machine,placed at front desk for pt to pick up,wife made aware

## 2016-04-09 ENCOUNTER — Encounter: Payer: BLUE CROSS/BLUE SHIELD | Admitting: Cardiovascular Disease

## 2016-04-09 ENCOUNTER — Telehealth: Payer: Self-pay

## 2016-04-09 DIAGNOSIS — I3139 Other pericardial effusion (noninflammatory): Secondary | ICD-10-CM

## 2016-04-09 DIAGNOSIS — I313 Pericardial effusion (noninflammatory): Secondary | ICD-10-CM

## 2016-04-09 NOTE — Telephone Encounter (Signed)
STAT request for echo on Monday for pt prior to apt with NP made for echo

## 2016-04-09 NOTE — Telephone Encounter (Signed)
-----   Message from Herminio Commons, MD sent at 04/09/2016  1:33 PM EDT ----- Regarding: FW: Pericardial Effusion Please obtain a limited echo to reassess pericardial effusion prior to him seeing K. Lawrence. Thx.  ----- Message -----    From: Pixie Casino, MD    Sent: 04/09/2016  12:26 PM      To: Herminio Commons, MD, # Subject: RE: Pericardial Effusion                       Davonna Belling-  I reviewed his PET/CT images - there may be an increase in the size of his pericardial effusion. He is actually a patient of Dr. Bronson Ing, that I was seeing in the hospital. Follow-up is with his NP next week. I have included them in this message. They may wish to repeat the echo next week and consider diagnositic/therapeutic pericardiocentesis electively.  Thanks.  -Mali  ----- Message -----    From: Rigoberto Noel, MD    Sent: 04/08/2016   1:15 PM      To: Pixie Casino, MD Subject: RE:                                            Can you review his PET scan images? If necessary repeat echo sooner? If malignant effusion, pericardiocentesis may give a diagnosis   Rakesh ----- Message -----    From: Pixie Casino, MD    Sent: 04/08/2016  10:13 AM      To: Rigoberto Noel, MD Subject: RE:                                            Davonna Belling-  Was planning on re-imaging in a month .. if it is larger, he may be a candidate for needle pericardiocentesis. That would be less invasive than pericardial window.  Mali ----- Message -----    From: Rigoberto Noel, MD    Sent: 04/07/2016  11:23 PM      To: Pixie Casino, MD  Mali, What do you think about his pericardial effusion? He has extremely poor lung function-therefore likely, not a candidate for resection  Davonna Belling

## 2016-04-10 LAB — BASIC METABOLIC PANEL
BUN: 26 mg/dL — AB (ref 7–25)
CALCIUM: 8.5 mg/dL — AB (ref 8.6–10.3)
CO2: 30 mmol/L (ref 20–31)
CREATININE: 1.07 mg/dL (ref 0.70–1.25)
Chloride: 95 mmol/L — ABNORMAL LOW (ref 98–110)
Glucose, Bld: 97 mg/dL (ref 65–99)
Potassium: 3.7 mmol/L (ref 3.5–5.3)
Sodium: 132 mmol/L — ABNORMAL LOW (ref 135–146)

## 2016-04-12 ENCOUNTER — Ambulatory Visit (HOSPITAL_COMMUNITY)
Admission: RE | Admit: 2016-04-12 | Discharge: 2016-04-12 | Disposition: A | Discharge status: Home / Self Care | Payer: BLUE CROSS/BLUE SHIELD | Source: Ambulatory Visit | Attending: Cardiovascular Disease | Admitting: Cardiovascular Disease

## 2016-04-12 DIAGNOSIS — J449 Chronic obstructive pulmonary disease, unspecified: Secondary | ICD-10-CM | POA: Diagnosis not present

## 2016-04-12 DIAGNOSIS — I509 Heart failure, unspecified: Secondary | ICD-10-CM | POA: Insufficient documentation

## 2016-04-12 DIAGNOSIS — Z720 Tobacco use: Secondary | ICD-10-CM | POA: Insufficient documentation

## 2016-04-12 DIAGNOSIS — I313 Pericardial effusion (noninflammatory): Secondary | ICD-10-CM | POA: Diagnosis present

## 2016-04-12 DIAGNOSIS — I319 Disease of pericardium, unspecified: Secondary | ICD-10-CM | POA: Diagnosis not present

## 2016-04-12 DIAGNOSIS — I358 Other nonrheumatic aortic valve disorders: Secondary | ICD-10-CM | POA: Insufficient documentation

## 2016-04-12 DIAGNOSIS — I110 Hypertensive heart disease with heart failure: Secondary | ICD-10-CM | POA: Insufficient documentation

## 2016-04-12 DIAGNOSIS — I059 Rheumatic mitral valve disease, unspecified: Secondary | ICD-10-CM | POA: Insufficient documentation

## 2016-04-12 DIAGNOSIS — I3139 Other pericardial effusion (noninflammatory): Secondary | ICD-10-CM

## 2016-04-13 ENCOUNTER — Encounter: Payer: Self-pay | Admitting: Adult Health

## 2016-04-13 ENCOUNTER — Telehealth: Payer: Self-pay | Admitting: *Deleted

## 2016-04-13 ENCOUNTER — Other Ambulatory Visit (HOSPITAL_COMMUNITY)
Admission: RE | Admit: 2016-04-13 | Discharge: 2016-04-13 | Disposition: A | Discharge status: Home / Self Care | Payer: BLUE CROSS/BLUE SHIELD | Source: Ambulatory Visit | Attending: Adult Health | Admitting: Adult Health

## 2016-04-13 ENCOUNTER — Ambulatory Visit (INDEPENDENT_AMBULATORY_CARE_PROVIDER_SITE_OTHER): Payer: BLUE CROSS/BLUE SHIELD | Admitting: Adult Health

## 2016-04-13 VITALS — BP 122/78 | HR 82 | Ht 70.0 in | Wt 182.0 lb

## 2016-04-13 DIAGNOSIS — I319 Disease of pericardium, unspecified: Secondary | ICD-10-CM | POA: Diagnosis not present

## 2016-04-13 DIAGNOSIS — I3139 Other pericardial effusion (noninflammatory): Secondary | ICD-10-CM

## 2016-04-13 DIAGNOSIS — I10 Essential (primary) hypertension: Secondary | ICD-10-CM | POA: Diagnosis not present

## 2016-04-13 DIAGNOSIS — I313 Pericardial effusion (noninflammatory): Secondary | ICD-10-CM

## 2016-04-13 LAB — CBC WITH DIFFERENTIAL/PLATELET
Basophils Absolute: 0 10*3/uL (ref 0.0–0.1)
Basophils Relative: 0 %
Eosinophils Absolute: 0.2 10*3/uL (ref 0.0–0.7)
Eosinophils Relative: 2 %
HCT: 29.7 % — ABNORMAL LOW (ref 39.0–52.0)
HEMOGLOBIN: 9.9 g/dL — AB (ref 13.0–17.0)
LYMPHS ABS: 1.2 10*3/uL (ref 0.7–4.0)
LYMPHS PCT: 15 %
MCH: 31.5 pg (ref 26.0–34.0)
MCHC: 33.3 g/dL (ref 30.0–36.0)
MCV: 94.6 fL (ref 78.0–100.0)
Monocytes Absolute: 0.8 10*3/uL (ref 0.1–1.0)
Monocytes Relative: 10 %
NEUTROS ABS: 6 10*3/uL (ref 1.7–7.7)
NEUTROS PCT: 73 %
Platelets: 259 10*3/uL (ref 150–400)
RBC: 3.14 MIL/uL — AB (ref 4.22–5.81)
RDW: 14.6 % (ref 11.5–15.5)
WBC: 8.3 10*3/uL (ref 4.0–10.5)

## 2016-04-13 LAB — BASIC METABOLIC PANEL
Anion gap: 7 (ref 5–15)
BUN: 15 mg/dL (ref 6–20)
CHLORIDE: 96 mmol/L — AB (ref 101–111)
CO2: 30 mmol/L (ref 22–32)
Calcium: 8.2 mg/dL — ABNORMAL LOW (ref 8.9–10.3)
Creatinine, Ser: 1 mg/dL (ref 0.61–1.24)
GFR calc non Af Amer: >60 mL/min (ref >60)
Glucose, Bld: 94 mg/dL (ref 65–99)
POTASSIUM: 3.7 mmol/L (ref 3.5–5.1)
SODIUM: 133 mmol/L — AB (ref 135–145)

## 2016-04-13 NOTE — Progress Notes (Deleted)
Name: Shaun May    DOB: [birth year]  Age: 65 y.o.  MR#: [id number]       PCP:  Wende Neighbors, MD      Insurance: Payor: [employer] / Plan: BCBS OTHER / Product Type: *No Product type* /   CC:   No chief complaint on file.   VS Filed Vitals:   04/13/16 1406  BP: 122/78  Pulse: 82  Height: '5\' 10"'$  (1.778 m)  Weight: 182 lb (82.555 kg)  SpO2: 93%    Weights Current Weight  04/13/16 182 lb (82.555 kg)  04/08/16 176 lb (79.833 kg)  04/01/16 178 lb 2.1 oz (80.8 kg)    Blood Pressure  BP Readings from Last 3 Encounters:  04/13/16 122/78  04/08/16 86/54  04/01/16 156/75     Admit date:  (Not on file) Last encounter with RMR:  04/08/2016   Allergy Review of patient's allergies indicates no known allergies.  Current Outpatient Prescriptions  Medication Sig Dispense Refill  . albuterol (PROVENTIL) (2.5 MG/3ML) 0.083% nebulizer solution Take 3 mLs (2.5 mg total) by nebulization every 4 (four) hours as needed for wheezing or shortness of breath. 75 mL 12  . aspirin EC 81 MG EC tablet Take 1 tablet (81 mg total) by mouth daily.    Marland Kitchen atorvastatin (LIPITOR) 40 MG tablet Take 1 tablet (40 mg total) by mouth daily at 6 PM. 30 tablet 6  . carvedilol (COREG) 25 MG tablet Take 25 mg by mouth 2 (two) times daily with a meal.   11  . guaiFENesin (MUCINEX) 600 MG 12 hr tablet Take 1 tablet (600 mg total) by mouth 2 (two) times daily. 30 tablet 0  . LORazepam (ATIVAN) 2 MG tablet Take 1 tablet by mouth every 6 (six) hours as needed for anxiety.     . nicotine (NICODERM CQ - DOSED IN MG/24 HOURS) 21 mg/24hr patch Place 1 patch (21 mg total) onto the skin daily. 28 patch 0  . oxyCODONE-acetaminophen (PERCOCET/ROXICET) 5-325 MG tablet Take 1-2 tablets by mouth every 6 (six) hours as needed. (Patient taking differently: Take 1-2 tablets by mouth every 6 (six) hours as needed for moderate pain. ) 15 tablet 0  . potassium chloride SA (K-DUR,KLOR-CON) 20 MEQ tablet Take 1 tablet by mouth daily.     . predniSONE (DELTASONE) 10 MG tablet Take '40mg'$  po daily for 2 days then '30mg'$  daily for 2 days then '20mg'$  daily for 2 days then '10mg'$  daily for 2 days then stop 20 tablet 0  . PROAIR HFA 108 (90 Base) MCG/ACT inhaler Inhale 2 puffs into the lungs every 4 (four) hours as needed for wheezing or shortness of breath. 1 Inhaler 0  . rivaroxaban (XARELTO) 20 MG TABS tablet Take 1 tablet (20 mg total) by mouth daily with supper. 30 tablet 11  . rOPINIRole (REQUIP) 0.25 MG tablet Take 1-2 tablets by mouth at bedtime as needed (restless leg).   1   No current facility-administered medications for this visit.    Discontinued Meds:   There are no discontinued medications.  Patient Active Problem List   Diagnosis Date Noted  . Atrial flutter ([place]), paroxysmal   . Pericardial effusion   . Acute on chronic systolic (congestive) heart failure ([place]) 03/29/2016  . COPD (chronic obstructive pulmonary disease) ([place]) 03/29/2016  . Elevated troponin 03/29/2016  . Restless leg syndrome 03/29/2016  . Pulmonary nodule, right 03/29/2016  . Chest pain 03/27/2016  . COPD exacerbation ([place]) 03/27/2016  .  Hypertension   . Essential hypertension   . Bradycardia 10/25/2015  . Heart block AV second degree 10/25/2015  . Accelerated hypertension 10/25/2015  . Hemochromatosis 10/25/2015  . Tobacco abuse 10/25/2015  . Alcohol dependence ([place]) 10/25/2015  . Second degree AV block 10/25/2015    LABS    Component Value Date/Time   NA 132* 04/08/2016 1440   NA 136 04/01/2016 0351   NA 136 03/30/2016 0539   K 3.7 04/08/2016 1440   K 3.2* 04/01/2016 0351   K 3.6 03/30/2016 0539   CL 95* 04/08/2016 1440   CL 96* 04/01/2016 0351   CL 98* 03/30/2016 0539   CO2 30 04/08/2016 1440   CO2 31 04/01/2016 0351   CO2 30 03/30/2016 0539   GLUCOSE 97 04/08/2016 1440   GLUCOSE 97 04/01/2016 0351   GLUCOSE 115* 03/30/2016 0539   BUN 26* 04/08/2016 1440   BUN 24* 04/01/2016 0351   BUN 30* 03/30/2016 0539   CREATININE  1.07 04/08/2016 1440   CREATININE 0.72 04/01/2016 0351   CREATININE 0.75 03/30/2016 0539   CREATININE 0.76 03/29/2016 0807   CALCIUM 8.5* 04/08/2016 1440   CALCIUM 8.4* 04/01/2016 0351   CALCIUM 8.8* 03/30/2016 0539   GFRNONAA >60 04/01/2016 0351   GFRNONAA >60 03/30/2016 0539   GFRNONAA >60 03/29/2016 0807   GFRAA >60 04/01/2016 0351   GFRAA >60 03/30/2016 0539   GFRAA >60 03/29/2016 0807   CMP     Component Value Date/Time   NA 132* 04/08/2016 1440   K 3.7 04/08/2016 1440   CL 95* 04/08/2016 1440   CO2 30 04/08/2016 1440   GLUCOSE 97 04/08/2016 1440   BUN 26* 04/08/2016 1440   CREATININE 1.07 04/08/2016 1440   CREATININE 0.72 04/01/2016 0351   CALCIUM 8.5* 04/08/2016 1440   PROT 7.6 03/29/2016 0807   ALBUMIN 3.8 03/29/2016 0807   AST 132* 03/29/2016 0807   ALT 105* 03/29/2016 0807   ALKPHOS 73 03/29/2016 0807   BILITOT 1.5* 03/29/2016 0807   GFRNONAA >60 04/01/2016 0351   GFRAA >60 04/01/2016 0351       Component Value Date/Time   WBC 10.6* 04/01/2016 0351   WBC 13.6* 03/30/2016 0539   WBC 19.6* 03/29/2016 0807   HGB 9.8* 04/01/2016 0351   HGB 11.3* 03/30/2016 0539   HGB 13.3 03/29/2016 0807   HCT 30.0* 04/01/2016 0351   HCT 34.2* 03/30/2016 0539   HCT 40.2 03/29/2016 0807   MCV 94.0 04/01/2016 0351   MCV 95.3 03/30/2016 0539   MCV 95.3 03/29/2016 0807    Lipid Panel  No results found for: CHOL, TRIG, HDL, CHOLHDL, VLDL, LDLCALC, LDLDIRECT  ABG No results found for: PHART, PCO2ART, PO2ART, HCO3, TCO2, ACIDBASEDEF, O2SAT   Lab Results  Component Value Date   TSH 0.514 03/29/2016   BNP (last 3 results)  Recent Labs  10/25/15 1420 03/27/16 0145 03/29/16 0807  BNP 95.0 138.0* 488.0*    ProBNP (last 3 results) No results for input(s): PROBNP in the last 8760 hours.  Cardiac Panel (last 3 results) No results for input(s): CKTOTAL, CKMB, TROPONINI, RELINDX in the last 72 hours.  Iron/TIBC/Ferritin/ %Sat    Component Value Date/Time   FERRITIN  275 10/26/2015 0905     EKG Orders placed or performed during the hospital encounter of 03/29/16  . ED EKG  . ED EKG  . EKG 12-Lead  . EKG 12-Lead     Prior Assessment and Plan Problem List as of 04/13/2016  Cardiovascular and Mediastinum   Heart block AV second degree   Accelerated hypertension   Second degree AV block   Hypertension   Essential hypertension   Acute on chronic systolic (congestive) heart failure (HCC)   Atrial flutter (HCC), paroxysmal   Pericardial effusion     Respiratory   COPD exacerbation (HCC)   COPD (chronic obstructive pulmonary disease) (HCC)     Other   Bradycardia   Hemochromatosis   Tobacco abuse   Alcohol dependence (HCC)   Chest pain   Elevated troponin   Restless leg syndrome   Pulmonary nodule, right       Imaging: Dg Chest 2 View  03/29/2016  CLINICAL DATA:  Shortness of breath.  COPD exacerbation. EXAM: CHEST  2 VIEW COMPARISON:  03/28/2016 FINDINGS: mild hyperinflation. moderate thoracic spondylosis. Prominent upper thoracic osteophytes, causing increased density over the spine on the lateral view. Pacer with leads at right atrium and right ventricle. No lead discontinuity. Midline trachea. Mild cardiomegaly with transverse aortic atherosclerosis. Trace bilateral pleural effusions. Mild pulmonary venous congestion. No lobar consolidation. No pneumothorax. IMPRESSION: Cardiomegaly and mild pulmonary venous congestion. Probable trace bilateral pleural effusions. Electronically Signed   By: Abigail Miyamoto M.D.   On: 03/29/2016 08:17   Dg Chest 2 View  03/28/2016  CLINICAL DATA:  Shortness of breath EXAM: CHEST  2 VIEW COMPARISON:  03/27/2016 FINDINGS: Lungs are clear. Small bilateral pleural effusions. No pneumothorax. Stable cardiomegaly.  Left subclavian pacemaker. Degenerative changes of the visualized thoracolumbar spine. IMPRESSION: Small bilateral pleural effusions. Electronically Signed   By: Julian Hy M.D.   On: 03/28/2016  11:11   Dg Chest 2 View  03/27/2016  CLINICAL DATA:  Chest pain and shortness of breath for 6 hours. EXAM: CHEST  2 VIEW COMPARISON:  10/29/2015 FINDINGS: Dual lead left-sided pacemaker in place. The ventricular lead is no slightly tortuous. There is cardiomegaly that is new from prior exam. No pulmonary edema. No confluent airspace disease. No pleural effusion or pneumothorax. There is degenerative change in the spine. IMPRESSION: 1. Cardiomegaly, new from exam 5 months prior. The ventricular lead of the dual lead pacemaker is tortuous. 2. No pulmonary edema or localizing process. Electronically Signed   By: Jeb Levering M.D.   On: 03/27/2016 02:44   Ct Angio Chest Pe W/cm &/or Wo Cm  03/29/2016  CLINICAL DATA:  [facility] called out for sob. Pt was discharged yesterday from here on April 22 nd for COPD exacerbation. Pt reports feeling sob since being discharged but pt got worse during the night tonight EXAM: CT ANGIOGRAPHY CHEST WITH CONTRAST TECHNIQUE: Multidetector CT imaging of the chest was performed using the standard protocol during bolus administration of intravenous contrast. Multiplanar CT image reconstructions and MIPs were obtained to evaluate the vascular anatomy. CONTRAST:  162m OMNIPAQUE IOHEXOL 350 MG/ML SOLN COMPARISON:  03/14/2008 FINDINGS: Vascular: Left subclavian pacemaker leads extend to the right atrium and right ventricular apex. Right arm IV contrast injection. The SVC is patent. Mild right atrial enlargement. The right ventricle is nondilated. Mild dilatation of central pulmonary arteries. Patent bilateral pulmonary veins drain into the left atrium. Scattered coronary calcifications. Adequate contrast opacification of the thoracic aorta with no evidence of dissection, aneurysm, or stenosis. There is classic 3-vessel brachiocephalic arch anatomy without proximal stenosis. Scattered calcifications in the arch and descending thoracic segment. Visualized suprarenal abdominal aorta shows  minimal atheromatous plaque. Mediastinum/Lymph Nodes: No masses or pathologically enlarged lymph nodes identified. Moderate pericardial effusion. Lungs/Pleura: Bilateral small pleural effusions.  Pulmonary emphysema. 17 mm spiculated nodule in posterior segment right upper lobe image 37/5. Subsegmental dependent atelectasis posteriorly in both lower lobes. Upper abdomen: No acute findings. Musculoskeletal: Flowing osteophytes across multiple contiguous levels in the mid and lower thoracic spine. Sternum intact. Review of the MIP images confirms the above findings. IMPRESSION: 1. Negative for acute PE or thoracic aortic dissection. 2. Pericardial and bilateral pleural effusions. 3. 17 mm spiculated posterior right upper lobe nodule suggesting primary bronchogenic carcinoma. Consider thoracic surgical consultation. 4. Atherosclerosis, including aortic and coronary artery disease. Please note that although the presence of coronary artery calcium documents the presence of coronary artery disease, the severity of this disease and any potential stenosis cannot be assessed on this non-gated CT examination. Assessment for potential risk factor modification, dietary therapy or pharmacologic therapy may be warranted, if clinically indicated. Electronically Signed   By: Lucrezia Europe M.D.   On: 03/29/2016 13:32   Nm Pet Image Initial (pi) Skull Base To Thigh  04/07/2016  CLINICAL DATA:  Initial treatment strategy for pulmonary nodule . EXAM: NUCLEAR MEDICINE PET SKULL BASE TO THIGH TECHNIQUE: 9.8 mCi F-18 FDG was injected intravenously. Full-ring PET imaging was performed from the skull base to thigh after the radiotracer. CT data was obtained and used for attenuation correction and anatomic localization. FASTING BLOOD GLUCOSE:  Value: 107 mg/dl COMPARISON:  None. FINDINGS: NECK No hypermetabolic lymph nodes in the neck. CHEST 14.5 mm right upper lobe pulmonary nodule adjacent to the major fissure is hypermetabolic with SUV max  of 6.7. This is consistent with neoplasm. No enlarged or metabolically active mediastinal or hilar lymph nodes are identified. No other pulmonary lesions are seen. There are underlying emphysematous changes. Moderate-sized pericardial effusion. ABDOMEN/PELVIS No abnormal hypermetabolic activity within the liver, pancreas, adrenal glands, or spleen. No hypermetabolic lymph nodes in the abdomen or pelvis. Additional findings include advanced atherosclerotic calcifications involving the aorta and iliac arteries. There is also a separate. Right renal cyst. Small amount free pelvic fluid of uncertain significance or etiology. SKELETON Diffuse osseous uptake but no focal lesions. IMPRESSION: 1. 14.5 mm right upper lobe pulmonary nodule is hypermetabolic and consistent with neoplasm. No enlarged or metabolically active mediastinal or hilar lymph nodes and no findings for metastatic disease. 2. Moderate-sized pericardial effusion. 3. Diffuse osseous uptake without focal lesion. Electronically Signed   By: Marijo Sanes M.D.   On: 04/07/2016 14:52

## 2016-04-13 NOTE — Telephone Encounter (Signed)
-----   Message from Lendon Colonel, NP sent at 04/13/2016  4:28 PM EDT ----- Labs essentially unchanged. Continue current regimen. He is to have family member make sure he is taking his medications as directed.

## 2016-04-13 NOTE — Telephone Encounter (Signed)
Called patient with test results. No answer. Unable to leave message.  

## 2016-04-13 NOTE — Patient Instructions (Signed)
Your physician recommends that you schedule a follow-up appointment in: 1 Month with [place]  Your physician recommends that you have lab work today.  Your physician has requested that you have an echocardiogram. Echocardiography is a painless test that uses sound waves to create images of your heart. It provides your doctor with information about the size and shape of your heart and how well your heart's chambers and valves are working. This procedure takes approximately one hour. There are no restrictions for this procedure.  Your physician recommends that you continue on your current medications as directed. Please refer to the Current Medication list given to you today.  Thank you for choosing [facility]!

## 2016-04-13 NOTE — Progress Notes (Signed)
Cardiology Office Note   Date:  04/13/2016   ID:  Shaun May, DOB 1951/02/05, MRN [id number]  PCP:  Wende Neighbors, MD  Cardiologist: Woodroe Chen, NP   Chief Complaint  Patient presents with  . Pericardial Effusion  . Hypotension      History of Present Illness: Shaun May is a 65 y.o. male who presents for ongoing assessment and management of complete heart block, status post [employer] pacemaker placed November 2016, hypertension, COPD, history of hemochromatosis, ETOH and tobacco abuse. He was recently admitted to the hospital with COPD exacerbation and worsening shortness of breath.  On last office visit the patient was found to be hypotensive, medications were reduced, he was taken off of Lasix and lisinopril. He was advised to keep a record of his blood pressure and bring back results to me on next appointment. In the interim, the patient had an echocardiogram.  Left ventricle: The cavity size was normal. Wall thickness was  increased in a pattern of mild LVH. Systolic function was normal.  The estimated ejection fraction was in the range of 55% to 60%.  Wall motion was normal; there were no regional wall motion  abnormalities. Doppler parameters are consistent with abnormal  left ventricular relaxation (grade 1 diastolic dysfunction). - Aortic valve: Mildly calcified annulus. Trileaflet; normal  thickness leaflets. Valve area (VTI): 2.39 cm^2. Valve area  (Vmax): 2.76 cm^2. - Mitral valve: Mildly calcified annulus. Normal thickness leaflets  . - Left atrium: The atrium was moderately dilated. - Atrial septum: No defect or patent foramen ovale was identified. - Pulmonary arteries: Systolic pressure was mildly increased. PA  peak pressure: 37 mm Hg (S). - Inferior vena cava: The vessel was dilated. The respirophasic  diameter changes were blunted (< 50%), consistent with elevated  central venous pressure. - Pericardium, extracardiac: There is a  large sized circumferential  pericardial effusion measuring 2.5 cm adjacent to the LV in  diastole, appears most prominent toward the LV apex. There is no  significant evidence of cardiac tamponade physiology. Pericardial  effusion is sigicantly larger than 03/30/16 study, consider repeat  study in 3-4 weeks. The results of the echocardiogram were discussed with Dr. Bronson Ing his primary cardiologist.he recommended followup echocardiogram in 3 weeks.  Past Medical History  Diagnosis Date  . Hypertension   . Hemochromatosis   . Mobitz type 2 second degree heart block     PPM MDT 10/28/15 Dr. Lovena Le    Past Surgical History  Procedure Laterality Date  . Ep implantable device N/A 10/28/2015    Procedure: Pacemaker Implant;  Surgeon: Evans Lance, MD;  Location: [facility];  Service: Cardiovascular;  Laterality: N/A;  . Ep implantable device N/A 11/07/2015    Procedure: Pocket Revision;  Surgeon: Evans Lance, MD;  Location: [facility];  Service: Cardiovascular;  Laterality: N/A;  . Cardiac catheterization N/A 03/31/2016    Procedure: Left Heart Cath and Coronary Angiography;  Surgeon: Wellington Hampshire, MD;  Location: [facility];  Service: Cardiovascular;  Laterality: N/A;     Current Outpatient Prescriptions  Medication Sig Dispense Refill  . albuterol (PROVENTIL) (2.5 MG/3ML) 0.083% nebulizer solution Take 3 mLs (2.5 mg total) by nebulization every 4 (four) hours as needed for wheezing or shortness of breath. 75 mL 12  . aspirin EC 81 MG EC tablet Take 1 tablet (81 mg total) by mouth daily.    Marland Kitchen atorvastatin (LIPITOR) 40 MG tablet Take 1 tablet (40  mg total) by mouth daily at 6 PM. 30 tablet 6  . carvedilol (COREG) 25 MG tablet Take 25 mg by mouth 2 (two) times daily with a meal.   11  . guaiFENesin (MUCINEX) 600 MG 12 hr tablet Take 1 tablet (600 mg total) by mouth 2 (two) times daily. 30 tablet 0  . LORazepam (ATIVAN) 2 MG tablet Take 1 tablet by mouth  every 6 (six) hours as needed for anxiety.     . nicotine (NICODERM CQ - DOSED IN MG/24 HOURS) 21 mg/24hr patch Place 1 patch (21 mg total) onto the skin daily. 28 patch 0  . oxyCODONE-acetaminophen (PERCOCET/ROXICET) 5-325 MG tablet Take 1-2 tablets by mouth every 6 (six) hours as needed. (Patient taking differently: Take 1-2 tablets by mouth every 6 (six) hours as needed for moderate pain. ) 15 tablet 0  . potassium chloride SA (K-DUR,KLOR-CON) 20 MEQ tablet Take 1 tablet by mouth daily.    . predniSONE (DELTASONE) 10 MG tablet Take '40mg'$  po daily for 2 days then '30mg'$  daily for 2 days then '20mg'$  daily for 2 days then '10mg'$  daily for 2 days then stop 20 tablet 0  . PROAIR HFA 108 (90 Base) MCG/ACT inhaler Inhale 2 puffs into the lungs every 4 (four) hours as needed for wheezing or shortness of breath. 1 Inhaler 0  . rivaroxaban (XARELTO) 20 MG TABS tablet Take 1 tablet (20 mg total) by mouth daily with supper. 30 tablet 11  . rOPINIRole (REQUIP) 0.25 MG tablet Take 1-2 tablets by mouth at bedtime as needed (restless leg).   1   No current facility-administered medications for this visit.    Allergies:   Review of patient's allergies indicates no known allergies.    Social History:  The patient  reports that he has been smoking E-cigarettes.  He has been smoking about 0.00 packs per day. He does not have any smokeless tobacco history on file. He reports that he drinks about 12.6 oz of alcohol per week. He reports that he does not use illicit drugs.   Family History:  The patient's family history includes Diabetes in his other.    ROS: All other systems are reviewed and negative. Unless otherwise mentioned in H&P    PHYSICAL EXAM: VS:  BP 122/78 mmHg  Pulse 82  Ht '5\' 10"'$  (1.778 m)  Wt 182 lb (82.555 kg)  BMI 26.11 kg/m2  SpO2 93% , BMI Body mass index is 26.11 kg/(m^2). GEN: Well nourished, well developed, in no acute distress HEENT: normal Neck: no JVD, carotid bruits, or  masses Cardiac: RRR distant heart sounds,; no murmurs, rubs, or gallops mild nonpitting pretibial edema  Respiratory:  clear to auscultation bilaterally, normal work of breathing GI: soft, nontender, nondistended, + BS MS: no deformity or atrophy Skin: warm and dry, no rash Neuro:  Strength and sensation are intact Psych: euthymic mood, full affect   Recent Labs: 10/28/2015: Magnesium 2.0 03/29/2016: ALT 105*; B Natriuretic Peptide 488.0*; TSH 0.514 04/01/2016: Hemoglobin 9.8*; Platelets 256 04/08/2016: BUN 26*; Creat 1.07; Potassium 3.7; Sodium 132*    Lipid Panel No results found for: CHOL, TRIG, HDL, CHOLHDL, VLDL, LDLCALC, LDLDIRECT    Wt Readings from Last 3 Encounters:  04/13/16 182 lb (82.555 kg)  04/08/16 176 lb (79.833 kg)  04/01/16 178 lb 2.1 oz (80.8 kg)     ASSESSMENT AND PLAN:  1. Pericardial effusion: echocardiogram dated 04/12/2016 refill normal LV systolic function with an EF of 55-60% however the patient did have  a large size circumferential pericardial effusion measuring 2.5 cm adjacent to the LV in diastole and appears most prominent for the LV apex. There was no evidence of cardiac A non-physiology.  The patient stayed asymptomatic. He denies chest pain dyspnea or weakness. I've discussed with him the results of his echocardiogram and the need to be repeated in 2-3 weeks. He verbalizes understanding. In the interim we'll continue his current medication regimen. He noticed tocolysis she has worsening shortness of breath dizziness or chest pain.  2. Hypotension:I discontinued lisinopril Lasix and he is feeling much better. Blood pressure 122/78 compared to 86/54 on last office visit. He has some confusion about his other medications. The family member who is with him. I've asked the family member to please go over his medications and place them in a pill dispenser says he knows when to take them and so that he will not forget any doses. He has forgotten to take the  [facility] on occasion. I explained the importance of him being mindful of his medications and dosing. There are member will assist.  3. Paroxysmal atrial flutter: patient's heart rate is well controlled currently. He continues on XARELTO. No evidence of bleeding. Will repeat a CBC and a BMET.   Current medicines are reviewed at length with the patient today.    Labs/ tests ordered today include: CBC, BMET, echocardiogram.  Orders Placed This Encounter  Procedures  . Basic Metabolic Panel (BMET)  . CBC with Differential  . Echocardiogram     Disposition:   FU with 1 month with Dr. Bronson Ing. Signed, Jory Sims, NP  04/13/2016 3:32 PM    [facility] [address], [place],  [address] Phone: [phone]; Fax: [phone]

## 2016-04-16 ENCOUNTER — Other Ambulatory Visit: Payer: Self-pay | Admitting: Pulmonary Disease

## 2016-04-19 ENCOUNTER — Ambulatory Visit: Payer: BLUE CROSS/BLUE SHIELD | Admitting: Internal Medicine

## 2016-04-22 ENCOUNTER — Ambulatory Visit: Payer: BLUE CROSS/BLUE SHIELD | Admitting: Pulmonary Disease

## 2016-04-22 ENCOUNTER — Ambulatory Visit (INDEPENDENT_AMBULATORY_CARE_PROVIDER_SITE_OTHER): Payer: BLUE CROSS/BLUE SHIELD | Admitting: Pulmonary Disease

## 2016-04-22 ENCOUNTER — Encounter: Payer: Self-pay | Admitting: Pulmonary Disease

## 2016-04-22 VITALS — BP 136/74 | HR 76 | Ht 70.0 in | Wt 188.2 lb

## 2016-04-22 DIAGNOSIS — I3139 Other pericardial effusion (noninflammatory): Secondary | ICD-10-CM

## 2016-04-22 DIAGNOSIS — R911 Solitary pulmonary nodule: Secondary | ICD-10-CM | POA: Diagnosis not present

## 2016-04-22 DIAGNOSIS — I5023 Acute on chronic systolic (congestive) heart failure: Secondary | ICD-10-CM

## 2016-04-22 DIAGNOSIS — I313 Pericardial effusion (noninflammatory): Secondary | ICD-10-CM

## 2016-04-22 DIAGNOSIS — I319 Disease of pericardium, unspecified: Secondary | ICD-10-CM

## 2016-04-22 DIAGNOSIS — J439 Emphysema, unspecified: Secondary | ICD-10-CM

## 2016-04-22 NOTE — Progress Notes (Signed)
   Subjective:    Patient ID: Shaun May, male    DOB: 1951/07/12, 65 y.o.   MRN: [id number]  HPI  65 year old heavy smoker For follow-up of COPD and lung nodule He smoked about 2 packs per day, more than 100 pack years  03/2016  He presented with shortness of breath cough and wheezing, elevated troponin and new finding of cardiomyopathy on echo with an EF of 35%. He developed new onset atrial fibrillation requiring anticoagulation. Left heart cath showed no evidence of CAD CT angiogram showed incidental finding of 17 mm spiculated right upper lobe nodule     Chief Complaint  Patient presents with  . Follow-up    HFU, reports still having DOE, wheezing, tightness in chest, prod cough with brown mucus.  does also note some bilateral LE Edema with redness/pain in the left   Cards FU visit 04/12/16 >> BP was low hence lasix & lisinopril stopped  He is accompanied by his girlfriend and is requiring a wheelchair today Since his Lasix was stopped, he has developed increasing pedal edema over the last 2 weeks and increasing dyspnea 's blood pressure is better today and he denies fainting or dizziness His oxygen level is noted to be low at 87% at rest and improved with 2 L of oxygen  We reviewed his CT and PET scan images and respiratory function testing today  Significant tests/ events  CTA chest 4/24>>> 17 mm spiculated posterior right upper lobe nodule L heart cath 4/26>>>1.no evidence of obstructive coronary artery disease. Left dominant system with very short left main. 2. High normal left ventricular end-diastolic pressure. 3. Mildly reduced LV systolic function with an ejection fraction of 45% with apical akinesis.  PFTs >> Severe airway obstruction with FEV1 27%-0.94, no bronchodilator response, ratio 44, DLCO 26%  PET 04/2016 14.5 mm right upper lobe pulmonary nodule is hypermetabolic    Review of Systems neg for any significant sore throat, dysphagia, itching, sneezing, nasal  congestion or excess/ purulent secretions, fever, chills, sweats, unintended wt loss, pleuritic or exertional cp, hempoptysis, orthopnea pnd  Also denies presyncope, palpitations, heartburn, abdominal pain, nausea, vomiting, diarrhea or change in bowel or urinary habits, dysuria,hematuria, rash, arthralgias, visual complaints, headache, numbness weakness or ataxia.     Objective:   Physical Exam  Gen. Pleasant, well-nourished, in no distress, normal affect ENT - no lesions, no post nasal drip Neck: No JVD, no thyromegaly, no carotid bruits Lungs: no use of accessory muscles, no dullness to percussion, Decreased without rales or rhonchi  Cardiovascular: Rhythm regular, heart sounds  normal, no murmurs or gallops, 2+ peripheral edema Abdomen: soft and non-tender, no hepatosplenomegaly, BS normal. Musculoskeletal: No deformities, no cyanosis or clubbing Neuro:  alert, non focal       Assessment & Plan:

## 2016-04-22 NOTE — Patient Instructions (Addendum)
Get back on Lasix 40 mg twice daily Your blood pressure is doing better Contact cardiologist and make an appointment-I will discuss with them about drawing out fluid from around her heart.  We discussed about nodule in the right upper lung being possibly cancer based on PET scan. You may need a biopsy eventually-once her heart and fluid issues are better controlled You are not a candidate for lung surgery and will likely need radiation  Your oxygen level is running low today-we will start you on oxygen in the meantime

## 2016-04-23 NOTE — Assessment & Plan Note (Signed)
We discussed about nodule in the right upper lung being possibly cancer based on PET scan. You may need a biopsy eventually-once her heart and fluid issues are better controlled You are not a candidate for lung surgery and will likely need radiation

## 2016-04-23 NOTE — Assessment & Plan Note (Signed)
Get back on Lasix 40 mg twice daily Your blood pressure is doing better

## 2016-04-23 NOTE — Assessment & Plan Note (Signed)
Contact cardiologist and make an appointment-I will discuss with dr Bronson Ing about drawing out fluid from around her heart.

## 2016-04-23 NOTE — Assessment & Plan Note (Signed)
We will had laba/ lama in the future Your oxygen level is running low today-we will start you on oxygen in the meantime  Smoking cessation was again emphasized

## 2016-05-03 ENCOUNTER — Other Ambulatory Visit (HOSPITAL_COMMUNITY): Payer: BLUE CROSS/BLUE SHIELD

## 2016-05-04 ENCOUNTER — Ambulatory Visit (HOSPITAL_COMMUNITY)
Admission: RE | Admit: 2016-05-04 | Discharge: 2016-05-04 | Disposition: A | Discharge status: Home / Self Care | Payer: BLUE CROSS/BLUE SHIELD | Source: Ambulatory Visit | Attending: Adult Health | Admitting: Adult Health

## 2016-05-04 DIAGNOSIS — Z87891 Personal history of nicotine dependence: Secondary | ICD-10-CM | POA: Insufficient documentation

## 2016-05-04 DIAGNOSIS — I110 Hypertensive heart disease with heart failure: Secondary | ICD-10-CM | POA: Diagnosis not present

## 2016-05-04 DIAGNOSIS — I509 Heart failure, unspecified: Secondary | ICD-10-CM | POA: Diagnosis not present

## 2016-05-04 DIAGNOSIS — I319 Disease of pericardium, unspecified: Secondary | ICD-10-CM | POA: Diagnosis not present

## 2016-05-04 DIAGNOSIS — I059 Rheumatic mitral valve disease, unspecified: Secondary | ICD-10-CM | POA: Insufficient documentation

## 2016-05-04 DIAGNOSIS — I358 Other nonrheumatic aortic valve disorders: Secondary | ICD-10-CM | POA: Diagnosis not present

## 2016-05-04 DIAGNOSIS — I313 Pericardial effusion (noninflammatory): Secondary | ICD-10-CM | POA: Diagnosis present

## 2016-05-04 DIAGNOSIS — I3139 Other pericardial effusion (noninflammatory): Secondary | ICD-10-CM

## 2016-05-13 ENCOUNTER — Encounter: Payer: Self-pay | Admitting: Cardiovascular Disease

## 2016-05-13 ENCOUNTER — Ambulatory Visit (INDEPENDENT_AMBULATORY_CARE_PROVIDER_SITE_OTHER): Payer: BLUE CROSS/BLUE SHIELD | Admitting: Cardiovascular Disease

## 2016-05-13 VITALS — BP 120/60 | HR 68 | Ht 70.0 in | Wt 173.0 lb

## 2016-05-13 DIAGNOSIS — Z79899 Other long term (current) drug therapy: Secondary | ICD-10-CM

## 2016-05-13 DIAGNOSIS — I10 Essential (primary) hypertension: Secondary | ICD-10-CM | POA: Diagnosis not present

## 2016-05-13 DIAGNOSIS — I952 Hypotension due to drugs: Secondary | ICD-10-CM

## 2016-05-13 DIAGNOSIS — I4892 Unspecified atrial flutter: Secondary | ICD-10-CM

## 2016-05-13 DIAGNOSIS — R600 Localized edema: Secondary | ICD-10-CM

## 2016-05-13 DIAGNOSIS — I3139 Other pericardial effusion (noninflammatory): Secondary | ICD-10-CM

## 2016-05-13 DIAGNOSIS — I429 Cardiomyopathy, unspecified: Secondary | ICD-10-CM

## 2016-05-13 DIAGNOSIS — I313 Pericardial effusion (noninflammatory): Secondary | ICD-10-CM

## 2016-05-13 DIAGNOSIS — I319 Disease of pericardium, unspecified: Secondary | ICD-10-CM

## 2016-05-13 DIAGNOSIS — Z950 Presence of cardiac pacemaker: Secondary | ICD-10-CM

## 2016-05-13 NOTE — Progress Notes (Signed)
Patient ID: Shaun May, male   DOB: [birth year], 65 y.o.   MRN: [id number]      SUBJECTIVE: The patient presents for routine follow-up. He has a history of chronic systolic heart failure, nonischemic cardiomyopathy, pacemaker, COPD, alcohol and tobacco abuse, lung nodule, and pericardial effusion. I consulted on him while hospitalized on 03/30/16.  Most recent echocardiogram on 05/04/16 demonstrated normalization of LVEF to 60-65%, previously had been 35-40%. There was mild LVH, normal regional wall motion, severe left atrial dilatation, moderate right ventricular dilatation with mild to moderately reduced right ventricular systolic function, and a moderate circumferential pericardial effusion measuring 1.2 cm in diastole adjacent to the LV apex. There was no evidence of temporal not physiology and the fusion overall appeared smaller in size compared to study dated 04/12/16.  There was no evidence of obstructive coronary artery disease by coronary angiography 03/31/16.  He was seen by K. Lawrence NP on 04/08/16 and due to hypotension, Lasix and lisinopril were discontinued.  He was restarted on Lasix 40 mg twice daily by his pulmonologist for bilateral leg edema. He has stable exertional dyspnea. He has slept on 2 pillows for many years. He denies paroxysmal nocturnal dyspnea. He denies syncope.    Review of Systems: As per "subjective", otherwise negative.  No Known Allergies  Current Outpatient Prescriptions  Medication Sig Dispense Refill  . albuterol (PROVENTIL) (2.5 MG/3ML) 0.083% nebulizer solution Take 3 mLs (2.5 mg total) by nebulization every 4 (four) hours as needed for wheezing or shortness of breath. 75 mL 12  . aspirin EC 81 MG EC tablet Take 1 tablet (81 mg total) by mouth daily.    Marland Kitchen atorvastatin (LIPITOR) 40 MG tablet Take 1 tablet (40 mg total) by mouth daily at 6 PM. 30 tablet 6  . carvedilol (COREG) 25 MG tablet Take 25 mg by mouth 2 (two) times daily with a meal.   11  .  guaiFENesin (MUCINEX) 600 MG 12 hr tablet Take 1 tablet (600 mg total) by mouth 2 (two) times daily. 30 tablet 0  . LORazepam (ATIVAN) 2 MG tablet Take 1 tablet by mouth every 6 (six) hours as needed for anxiety.     Marland Kitchen oxyCODONE-acetaminophen (PERCOCET/ROXICET) 5-325 MG tablet Take 1-2 tablets by mouth every 6 (six) hours as needed. (Patient taking differently: Take 1-2 tablets by mouth every 6 (six) hours as needed for moderate pain. ) 15 tablet 0  . potassium chloride SA (K-DUR,KLOR-CON) 20 MEQ tablet Take 1 tablet by mouth daily.    . predniSONE (DELTASONE) 10 MG tablet Take '40mg'$  po daily for 2 days then '30mg'$  daily for 2 days then '20mg'$  daily for 2 days then '10mg'$  daily for 2 days then stop 20 tablet 0  . PROAIR HFA 108 (90 Base) MCG/ACT inhaler Inhale 2 puffs into the lungs every 4 (four) hours as needed for wheezing or shortness of breath. 1 Inhaler 0  . rivaroxaban (XARELTO) 20 MG TABS tablet Take 1 tablet (20 mg total) by mouth daily with supper. 30 tablet 11  . rOPINIRole (REQUIP) 0.25 MG tablet Take 1-2 tablets by mouth at bedtime as needed (restless leg).   1  . nicotine (NICODERM CQ - DOSED IN MG/24 HOURS) 21 mg/24hr patch Place 1 patch (21 mg total) onto the skin daily. (Patient not taking: Reported on 05/13/2016) 28 patch 0   No current facility-administered medications for this visit.    Past Medical History  Diagnosis Date  . Hypertension   . Hemochromatosis   .  Mobitz type 2 second degree heart block     PPM MDT 10/28/15 Dr. Lovena Le    Past Surgical History  Procedure Laterality Date  . Ep implantable device N/A 10/28/2015    Procedure: Pacemaker Implant;  Surgeon: Evans Lance, MD;  Location: [facility];  Service: Cardiovascular;  Laterality: N/A;  . Ep implantable device N/A 11/07/2015    Procedure: Pocket Revision;  Surgeon: Evans Lance, MD;  Location: [facility];  Service: Cardiovascular;  Laterality: N/A;  . Cardiac catheterization N/A 03/31/2016     Procedure: Left Heart Cath and Coronary Angiography;  Surgeon: Wellington Hampshire, MD;  Location: [facility];  Service: Cardiovascular;  Laterality: N/A;    Social History   Social History  . Marital Status: Divorced    Spouse Name: N/A  . Number of Children: N/A  . Years of Education: N/A   Occupational History  . Not on file.   Social History Main Topics  . Smoking status: Current Every Day Smoker -- 2.50 packs/day for 56 years    Types: E-cigarettes, Cigarettes  . Smokeless tobacco: Not on file     Comment: smoking 1/3ppd now (04/22/16)  . Alcohol Use: 12.6 oz/week    21 Shots of liquor per week     Comment: nightly  . Drug Use: No  . Sexual Activity: Not on file   Other Topics Concern  . Not on file   Social History Narrative     Filed Vitals:   05/13/16 0836  BP: 120/60  Pulse: 68  Height: '5\' 10"'$  (1.778 m)  Weight: 173 lb (78.472 kg)  SpO2: 91%    PHYSICAL EXAM General: NAD HEENT: Poor dentition. Neck: No JVD, no thyromegaly. Lungs: Poor air movement, no rales. CV: Nondisplaced PMI.  Regular rate and rhythm, normal S1/S2, no S3/S4, no murmur. 1+ pretibial edema b/l.     Abdomen: Soft, nontender, no distention.  Neurologic: Alert and oriented.  Psych: Normal affect. Skin: Normal. Musculoskeletal: No gross deformities.    ECG: Most recent ECG reviewed.      ASSESSMENT AND PLAN: 1. Pericardial effusion: Will repeat limited echo in three to four weeks to reassess size.  2. Cardiomyopathy: Normalization of LVEF as noted above. Continue Coreg.  3. Pacemaker: Followed by Dr. Lovena Le.  4. Paroxysmal atrial flutter: On Xarelto and Coreg.  5. Hypotension: Resolved off of ACEI.  6. Bilateral leg edema: Presumably due to diastolic dysfunction and RV dysfunction. Currently on Lasix 40 mg bid. No changes.  Dispo: fu 4 months.  Time spent: 40 minutes, of which greater than 50% was spent reviewing symptoms, relevant blood tests and studies, and  discussing management plan with the patient.   Kate Sable, M.D., F.A.C.C.

## 2016-05-13 NOTE — Patient Instructions (Signed)
Your physician recommends that you schedule a follow-up appointment in: 4 months    In first week of July, please get limited echocardiogram    Your physician recommends that you continue on your current medications as directed. Please refer to the Current Medication list given to you today.       Thank you for choosing [facility] !

## 2016-05-17 ENCOUNTER — Encounter: Payer: BLUE CROSS/BLUE SHIELD | Admitting: *Deleted

## 2016-05-17 ENCOUNTER — Telehealth: Payer: Self-pay | Admitting: Cardiology

## 2016-05-17 NOTE — Telephone Encounter (Signed)
LM w/ co - worker for pt to return call.

## 2016-05-17 NOTE — Telephone Encounter (Signed)
Spoke with pt and reminded pt of remote transmission that is due today. Pt verbalized understanding.   

## 2016-05-21 ENCOUNTER — Encounter: Payer: Self-pay | Admitting: Cardiology

## 2016-05-25 ENCOUNTER — Emergency Department (HOSPITAL_COMMUNITY): Payer: BLUE CROSS/BLUE SHIELD

## 2016-05-25 ENCOUNTER — Encounter (HOSPITAL_COMMUNITY): Payer: Self-pay | Admitting: Emergency Medicine

## 2016-05-25 ENCOUNTER — Inpatient Hospital Stay (HOSPITAL_COMMUNITY)
Admission: EM | Admit: 2016-05-25 | Discharge: 2016-05-28 | DRG: 812 | Disposition: A | Discharge status: Home / Self Care | Payer: BLUE CROSS/BLUE SHIELD | Attending: Internal Medicine | Admitting: Internal Medicine

## 2016-05-25 ENCOUNTER — Other Ambulatory Visit: Payer: Self-pay

## 2016-05-25 DIAGNOSIS — R0602 Shortness of breath: Secondary | ICD-10-CM | POA: Diagnosis present

## 2016-05-25 DIAGNOSIS — K2270 Barrett's esophagus without dysplasia: Secondary | ICD-10-CM | POA: Diagnosis present

## 2016-05-25 DIAGNOSIS — F1010 Alcohol abuse, uncomplicated: Secondary | ICD-10-CM | POA: Diagnosis present

## 2016-05-25 DIAGNOSIS — K228 Other specified diseases of esophagus: Secondary | ICD-10-CM | POA: Diagnosis not present

## 2016-05-25 DIAGNOSIS — E871 Hypo-osmolality and hyponatremia: Secondary | ICD-10-CM | POA: Diagnosis not present

## 2016-05-25 DIAGNOSIS — K921 Melena: Secondary | ICD-10-CM | POA: Diagnosis not present

## 2016-05-25 DIAGNOSIS — K259 Gastric ulcer, unspecified as acute or chronic, without hemorrhage or perforation: Secondary | ICD-10-CM | POA: Diagnosis present

## 2016-05-25 DIAGNOSIS — Z79899 Other long term (current) drug therapy: Secondary | ICD-10-CM

## 2016-05-25 DIAGNOSIS — I10 Essential (primary) hypertension: Secondary | ICD-10-CM | POA: Diagnosis present

## 2016-05-25 DIAGNOSIS — I442 Atrioventricular block, complete: Secondary | ICD-10-CM | POA: Diagnosis present

## 2016-05-25 DIAGNOSIS — I480 Paroxysmal atrial fibrillation: Secondary | ICD-10-CM | POA: Diagnosis present

## 2016-05-25 DIAGNOSIS — D500 Iron deficiency anemia secondary to blood loss (chronic): Secondary | ICD-10-CM | POA: Diagnosis not present

## 2016-05-25 DIAGNOSIS — D509 Iron deficiency anemia, unspecified: Secondary | ICD-10-CM | POA: Diagnosis present

## 2016-05-25 DIAGNOSIS — F17210 Nicotine dependence, cigarettes, uncomplicated: Secondary | ICD-10-CM | POA: Diagnosis present

## 2016-05-25 DIAGNOSIS — Z7901 Long term (current) use of anticoagulants: Secondary | ICD-10-CM | POA: Diagnosis not present

## 2016-05-25 DIAGNOSIS — D649 Anemia, unspecified: Secondary | ICD-10-CM | POA: Diagnosis not present

## 2016-05-25 DIAGNOSIS — Z7982 Long term (current) use of aspirin: Secondary | ICD-10-CM

## 2016-05-25 DIAGNOSIS — E876 Hypokalemia: Secondary | ICD-10-CM | POA: Diagnosis present

## 2016-05-25 DIAGNOSIS — N179 Acute kidney failure, unspecified: Secondary | ICD-10-CM | POA: Diagnosis present

## 2016-05-25 DIAGNOSIS — I959 Hypotension, unspecified: Secondary | ICD-10-CM | POA: Diagnosis present

## 2016-05-25 DIAGNOSIS — I4892 Unspecified atrial flutter: Secondary | ICD-10-CM | POA: Diagnosis present

## 2016-05-25 DIAGNOSIS — R911 Solitary pulmonary nodule: Secondary | ICD-10-CM | POA: Diagnosis present

## 2016-05-25 HISTORY — DX: Pericardial effusion (noninflammatory): I31.3

## 2016-05-25 HISTORY — DX: Other pericardial effusion (noninflammatory): I31.39

## 2016-05-25 HISTORY — DX: Solitary pulmonary nodule: R91.1

## 2016-05-25 LAB — CBC WITH DIFFERENTIAL/PLATELET
BASOS ABS: 0 10*3/uL (ref 0.0–0.1)
BASOS PCT: 0 %
Eosinophils Absolute: 0.1 10*3/uL (ref 0.0–0.7)
Eosinophils Relative: 1 %
HEMATOCRIT: 14.3 % — AB (ref 39.0–52.0)
HEMOGLOBIN: 4.2 g/dL — AB (ref 13.0–17.0)
LYMPHS PCT: 14 %
Lymphs Abs: 0.9 10*3/uL (ref 0.7–4.0)
MCH: 22.1 pg — ABNORMAL LOW (ref 26.0–34.0)
MCHC: 29.4 g/dL — ABNORMAL LOW (ref 30.0–36.0)
MCV: 75.3 fL — AB (ref 78.0–100.0)
MONOS PCT: 8 %
Monocytes Absolute: 0.6 10*3/uL (ref 0.1–1.0)
NEUTROS ABS: 5.2 10*3/uL (ref 1.7–7.7)
NEUTROS PCT: 77 %
Platelets: 244 10*3/uL (ref 150–400)
RBC: 1.9 MIL/uL — ABNORMAL LOW (ref 4.22–5.81)
RDW: 18.5 % — ABNORMAL HIGH (ref 11.5–15.5)
WBC: 6.8 10*3/uL (ref 4.0–10.5)

## 2016-05-25 LAB — COMPREHENSIVE METABOLIC PANEL
ALBUMIN: 3.2 g/dL — AB (ref 3.5–5.0)
ALT: 20 U/L (ref 17–63)
ANION GAP: 9 (ref 5–15)
AST: 29 U/L (ref 15–41)
Alkaline Phosphatase: 68 U/L (ref 38–126)
BUN: 43 mg/dL — ABNORMAL HIGH (ref 6–20)
CO2: 30 mmol/L (ref 22–32)
Calcium: 7.9 mg/dL — ABNORMAL LOW (ref 8.9–10.3)
Chloride: 88 mmol/L — ABNORMAL LOW (ref 101–111)
Creatinine, Ser: 1.39 mg/dL — ABNORMAL HIGH (ref 0.61–1.24)
GFR calc Af Amer: >60 mL/min (ref >60)
GFR calc non Af Amer: 52 mL/min — ABNORMAL LOW (ref >60)
GLUCOSE: 120 mg/dL — AB (ref 65–99)
POTASSIUM: 2.9 mmol/L — AB (ref 3.5–5.1)
SODIUM: 127 mmol/L — AB (ref 135–145)
TOTAL PROTEIN: 5.9 g/dL — AB (ref 6.5–8.1)
Total Bilirubin: 1.5 mg/dL — ABNORMAL HIGH (ref 0.3–1.2)

## 2016-05-25 LAB — PROTIME-INR
INR: 8.02 (ref 0.00–1.49)
Prothrombin Time: 64.1 seconds — ABNORMAL HIGH (ref 11.6–15.2)

## 2016-05-25 LAB — ABO/RH: ABO/RH(D): O POS

## 2016-05-25 LAB — PREPARE RBC (CROSSMATCH)

## 2016-05-25 LAB — POC OCCULT BLOOD, ED: FECAL OCCULT BLD: NEGATIVE

## 2016-05-25 LAB — TROPONIN I: Troponin I: 0.03 ng/mL (ref <0.031)

## 2016-05-25 MED ORDER — PANTOPRAZOLE SODIUM 40 MG IV SOLR
INTRAVENOUS | Status: AC
  Filled 2016-05-25: qty 160

## 2016-05-25 MED ORDER — SODIUM CHLORIDE 0.9 % IV SOLN
Freq: Once | INTRAVENOUS | Status: AC
  Administered 2016-05-25: via INTRAVENOUS

## 2016-05-25 MED ORDER — SODIUM CHLORIDE 0.9 % IV SOLN
INTRAVENOUS | Status: AC
  Administered 2016-05-26: 06:00:00 via INTRAVENOUS

## 2016-05-25 MED ORDER — SODIUM CHLORIDE 0.9 % IV SOLN
80.0000 mg | Freq: Once | INTRAVENOUS | Status: AC
  Administered 2016-05-26: 80 mg via INTRAVENOUS
  Filled 2016-05-25: qty 80

## 2016-05-25 MED ORDER — PANTOPRAZOLE SODIUM 40 MG IV SOLR: 40.0000 mg | Freq: Two times a day (BID) | INTRAVENOUS | Status: DC

## 2016-05-25 MED ORDER — SODIUM CHLORIDE 0.9 % IV SOLN
8.0000 mg/h | INTRAVENOUS | Status: DC
  Administered 2016-05-26 – 2016-05-27 (×4): 8 mg/h via INTRAVENOUS
  Filled 2016-05-25 (×8): qty 80

## 2016-05-25 MED ORDER — SODIUM CHLORIDE 0.9 % IV BOLUS (SEPSIS)
1000.0000 mL | Freq: Once | INTRAVENOUS | Status: AC
  Administered 2016-05-25: 1000 mL via INTRAVENOUS

## 2016-05-25 NOTE — ED Notes (Signed)
Pt noted to have O2 sat of less then 90- on room air- O2/2L with return of O2 sat of 96- IV established, monitor, pulse ox with labs drawn.

## 2016-05-25 NOTE — ED Notes (Signed)
Lab called: per Cheri Rous, pt hem is 4.2- EDP informed

## 2016-05-25 NOTE — ED Notes (Signed)
hospitalist at bedside. Second IV established

## 2016-05-25 NOTE — H&P (Signed)
TRH H&P   Patient Demographics:    Shaun May, is a 65 y.o. male  MRN: [id number]   DOB - 10-17-1951  Admit Date - 05/25/2016  Outpatient Primary MD for the patient is Wende Neighbors, MD  Referring MD/NP/PA:  Ezequiel Essex  Outpatient Specialists: Dr. Luan Pulling  Patient coming from: home  Chief Complaint  Patient presents with  . Abnormal Lab      HPI:    Shaun May  is a 65 y.o. male, w hx of hemochromatosis, Aflutter, 2nd degree AV block, ETOH abuse, Pulmonary nodule, (hypermetabolic on PET), apparently on xarelto, apparently felt sob, and lightheaded.  Pt states that he has been having symptoms for about 1-2 weeks.  Pt denies abd pain, heartburn, n/v, diarrhea, brbpr.  Might have had some black stool.  Pt was sent over by Delphina Cahill after labs showed severe anemia.   In ED,  Hgb 4.2,  No stool in vault.  Pt was also noted to have hyponatremia, hypokalemia, and mild renal insufficiency.  Pt will be admitted for severe anemia.     Review of systems:    In addition to the HPI above, No Fever-chills, No Headache, No changes with Vision or hearing, No problems swallowing food or Liquids, No Chest pain, Cough , + fatigue No Abdominal pain, No Nausea or Vommitting, Bowel movements are regular, No Blood in stool or Urine, No dysuria, No new skin rashes or bruises, No new joints pains-aches,  No new weakness, tingling, numbness in any extremity, No recent weight gain or loss, No polyuria, polydypsia or polyphagia, No significant Mental Stressors.  A full 10 point Review of Systems was done, except as stated above, all other Review of Systems were negative.   With Past History of the following :    Past Medical History  Diagnosis Date  . Hypertension   . Hemochromatosis   . Mobitz type 2 second degree heart block     PPM MDT 10/28/15 Dr. Lovena Le  . Atrial flutter  ([place])   . Pericardial effusion   . Lung nodule       Past Surgical History  Procedure Laterality Date  . Ep implantable device N/A 10/28/2015    Procedure: Pacemaker Implant;  Surgeon: Evans Lance, MD;  Location: [place] [facility];  Service: Cardiovascular;  Laterality: N/A;  . Ep implantable device N/A 11/07/2015    Procedure: Pocket Revision;  Surgeon: Evans Lance, MD;  Location: [place] [facility];  Service: Cardiovascular;  Laterality: N/A;  . Cardiac catheterization N/A 03/31/2016    Procedure: Left Heart Cath and Coronary Angiography;  Surgeon: Wellington Hampshire, MD;  Location: [place] [facility];  Service: Cardiovascular;  Laterality: N/A;  . Tonsillectomy    . Colonoscopy        Social History:     Social History  Substance Use Topics  . Smoking  status: Current Every Day Smoker -- 2.50 packs/day for 56 years    Types: E-cigarettes, Cigarettes  . Smokeless tobacco: Not on file     Comment: smoking 1/3ppd now (04/22/16)  . Alcohol Use: 12.6 oz/week    21 Shots of liquor per week     Comment: nightly     Lives - at home  Mobility - ambulates by self     Family History :     Family History  Problem Relation Age of Onset  . Diabetes Other   . Dementia Mother       Home Medications:   Prior to Admission medications   Medication Sig Start Date End Date Taking? Authorizing Provider  albuterol (PROVENTIL) (2.5 MG/3ML) 0.083% nebulizer solution Take 3 mLs (2.5 mg total) by nebulization every 4 (four) hours as needed for wheezing or shortness of breath. 04/01/16  Yes Eileen Stanford, PA-C  aspirin EC 81 MG EC tablet Take 1 tablet (81 mg total) by mouth daily. 04/01/16  Yes Eileen Stanford, PA-C  atorvastatin (LIPITOR) 40 MG tablet Take 1 tablet (40 mg total) by mouth daily at 6 PM. 04/01/16  Yes Eileen Stanford, PA-C  carvedilol (COREG) 25 MG tablet Take 12.5 mg by mouth 2 (two) times daily with a meal.  01/23/16  Yes Historical Provider, MD  furosemide  (LASIX) 40 MG tablet Take 40 mg by mouth daily.    Yes Historical Provider, MD  LORazepam (ATIVAN) 2 MG tablet Take 1 tablet by mouth 4 (four) times daily.  10/01/15  Yes Historical Provider, MD  oxyCODONE-acetaminophen (PERCOCET/ROXICET) 5-325 MG tablet Take 1-2 tablets by mouth every 6 (six) hours as needed. Patient taking differently: Take 1 tablet by mouth 4 (four) times daily.  01/20/16  Yes Kristen N Ward, DO  potassium chloride SA (K-DUR,KLOR-CON) 20 MEQ tablet Take 1 tablet by mouth daily. 10/14/15  Yes Historical Provider, MD  PROAIR HFA 108 (90 Base) MCG/ACT inhaler Inhale 2 puffs into the lungs every 4 (four) hours as needed for wheezing or shortness of breath. 03/28/16  Yes Kathie Dike, MD  rivaroxaban (XARELTO) 20 MG TABS tablet Take 1 tablet (20 mg total) by mouth daily with supper. 04/01/16  Yes Eileen Stanford, PA-C  rOPINIRole (REQUIP) 0.25 MG tablet Take 1-3 tablets by mouth at bedtime.  02/25/16  Yes Historical Provider, MD  guaiFENesin (MUCINEX) 600 MG 12 hr tablet Take 1 tablet (600 mg total) by mouth 2 (two) times daily. Patient not taking: Reported on 05/25/2016 03/28/16   Kathie Dike, MD  nicotine (NICODERM CQ - DOSED IN MG/24 HOURS) 21 mg/24hr patch Place 1 patch (21 mg total) onto the skin daily. Patient not taking: Reported on 05/13/2016 03/28/16   Kathie Dike, MD  predniSONE (DELTASONE) 10 MG tablet Take '40mg'$  po daily for 2 days then '30mg'$  daily for 2 days then '20mg'$  daily for 2 days then '10mg'$  daily for 2 days then stop Patient not taking: Reported on 05/25/2016 03/28/16   Kathie Dike, MD     Allergies:    No Known Allergies   Physical Exam:   Vitals  Blood pressure 101/60, pulse 62, temperature 97.9 F (36.6 C), temperature source Oral, resp. rate 15, height '5\' 10"'$  (1.778 m), weight 75.297 kg (166 lb), SpO2 100 %.   1. General  lying in bed in NAD,  pale  2. Normal affect and insight, Not Suicidal or Homicidal, Awake Alert, Oriented X 3.  3. No F.N  deficits, ALL C.Nerves Intact,  Strength 5/5 all 4 extremities, Sensation intact all 4 extremities, Plantars down going.  4. Ears and Eyes appear Normal, Conjunctivae clear, PERRLA. Moist Oral Mucosa.  5. Supple Neck, No JVD, No cervical lymphadenopathy appriciated, No Carotid Bruits.  6. Symmetrical Chest wall movement, Good air movement bilaterally, CTAB.  7. RRR, No Gallops, Rubs or Murmurs, No Parasternal Heave.  8. Positive Bowel Sounds, Abdomen Soft, No tenderness, No organomegaly appriciated,No rebound -guarding or rigidity.  9.  No Cyanosis, Normal Skin Turgor, No Skin Rash or Bruise.  1+ edema  10. Good muscle tone,  joints appear normal , no effusions, Normal ROM.  11. No Palpable Lymph Nodes in Neck or Axillae     Data Review:    CBC  Recent Labs Lab 05/25/16 2210  WBC 6.8  HGB 4.2*  HCT 14.3*  PLT 244  MCV 75.3*  MCH 22.1*  MCHC 29.4*  RDW 18.5*  LYMPHSABS 0.9  MONOABS 0.6  EOSABS 0.1  BASOSABS 0.0   ------------------------------------------------------------------------------------------------------------------  Chemistries   Recent Labs Lab 05/25/16 2210  NA 127*  K 2.9*  CL 88*  CO2 30  GLUCOSE 120*  BUN 43*  CREATININE 1.39*  CALCIUM 7.9*  AST 29  ALT 20  ALKPHOS 68  BILITOT 1.5*   ------------------------------------------------------------------------------------------------------------------ estimated creatinine clearance is 55.4 mL/min (by C-G formula based on Cr of 1.39). ------------------------------------------------------------------------------------------------------------------ No results for input(s): TSH, T4TOTAL, T3FREE, THYROIDAB in the last 72 hours.  Invalid input(s): FREET3  Coagulation profile  Recent Labs Lab 05/25/16 2210  INR 8.02*   ------------------------------------------------------------------------------------------------------------------- No results for input(s): DDIMER in the last 72  hours. -------------------------------------------------------------------------------------------------------------------  Cardiac Enzymes  Recent Labs Lab 05/25/16 2210  TROPONINI 0.03   ------------------------------------------------------------------------------------------------------------------    Component Value Date/Time   BNP 488.0* 03/29/2016 0807     ---------------------------------------------------------------------------------------------------------------  Urinalysis    Component Value Date/Time   COLORURINE AMBER* 03/27/2016 0836   APPEARANCEUR HAZY* 03/27/2016 0836   LABSPEC 1.025 03/27/2016 0836   PHURINE 6.0 03/27/2016 0836   GLUCOSEU 100* 03/27/2016 0836   HGBUR NEGATIVE 03/27/2016 0836   BILIRUBINUR NEGATIVE 03/27/2016 0836   KETONESUR NEGATIVE 03/27/2016 0836   PROTEINUR 30* 03/27/2016 0836   NITRITE NEGATIVE 03/27/2016 0836   LEUKOCYTESUR NEGATIVE 03/27/2016 0836    ----------------------------------------------------------------------------------------------------------------   Imaging Results:    Dg Chest Portable 1 View  05/25/2016  CLINICAL DATA:  Acute onset of dizziness and lightheadedness. Generalized weakness and fatigue. Bilateral leg swelling. Shortness of breath. Initial encounter. EXAM: PORTABLE CHEST 1 VIEW COMPARISON:  Chest radiograph and CTA of the chest performed 03/29/2016 FINDINGS: The lungs are well-aerated. Vascular congestion is noted. There is no evidence of focal opacification, pleural effusion or pneumothorax. The cardiomediastinal silhouette is within normal limits. A pacemaker is noted overlying the left chest wall, with leads ending overlying the right atrium and right ventricle. No acute osseous abnormalities are seen. IMPRESSION: Vascular congestion noted.  Lungs remain grossly clear. Electronically Signed   By: Garald Balding M.D.   On: 05/25/2016 23:16      Assessment & Plan:    Active Problems:   Symptomatic  anemia    1. Anemia hemeoccult stool, check ferritin, iron, tibc, folate, b12, spep, upep Type and cross 2 units already done, will probably need a total of about 3-4 units Transfuse, repeat cbc after transfusion of 2 units prbc.   2. Hypotension Hydration w ns iv, and transfusion Check trop i q6h x3 Check cortisol level  3. Hyponatremia,  Check serums osm, tsh,  cortisol, urine sodium , urine osm  4.  Renal insufficiency Hydrate with ns iv Check cmp in am   DVT Prophylaxis SCD  AM Labs Ordered, also please review Full Orders  Family Communication: Admission, patients condition and plan of care including tests being ordered have been discussed with the patient  who indicate understanding and agree with the plan and Code Status.  Code Status FULL CODE  Likely DC to  home  Condition GUARDED    Consults called:   Admission status: inpatient  Time spent in minutes : 58mnutes  JJani GravelM.D on 05/25/2016 at 11:32 PM  Between 7am to 7pm - Pager - [phone] After 7pm go to [email or web address] - password [facility] - Office  [phone]

## 2016-05-25 NOTE — ED Provider Notes (Signed)
CSN: [id number]     Arrival date & time 05/25/16  2127 History  By signing my name below, I, Nicole Kindred, attest that this documentation has been prepared under the direction and in the presence of Ezequiel Essex, MD.   Electronically Signed: Nicole Kindred, ED Scribe. 05/25/2016. 10:10 PM   Chief Complaint  Patient presents with  . Abnormal Lab    The history is provided by the patient. No language interpreter was used.   HPI Comments: Shaun May is a 65 y.o. male with PMHx of HTN, mobitz type 2 second degree heart block, and hemochromatosis who presents to the Emergency Department complaining of gradual onset, dizziness and light-headedness, ongoing for several days. Pt reports associated fatigue and generalized weakness. He also complains of bilateral leg swelling. Pt was seen by his PCP a few days ago and was told today that his hemoglobin was around 4. No other associated symptoms noted. No worsening or alleviating factors noted. Pt denies emesis, diarrhea, hematochezia, melena, chest pain, abdominal pain, shortness of breath, or any other pertinent symptoms. Pt intermittently uses 2L of oxygen at home. Pt smokes 2 packs/day. Pt is currently on xarelto.   Past Medical History  Diagnosis Date  . Hypertension   . Hemochromatosis   . Mobitz type 2 second degree heart block     PPM MDT 10/28/15 Dr. Lovena Le   Past Surgical History  Procedure Laterality Date  . Ep implantable device N/A 10/28/2015    Procedure: Pacemaker Implant;  Surgeon: Evans Lance, MD;  Location: [place] [facility];  Service: Cardiovascular;  Laterality: N/A;  . Ep implantable device N/A 11/07/2015    Procedure: Pocket Revision;  Surgeon: Evans Lance, MD;  Location: [place] [facility];  Service: Cardiovascular;  Laterality: N/A;  . Cardiac catheterization N/A 03/31/2016    Procedure: Left Heart Cath and Coronary Angiography;  Surgeon: Wellington Hampshire, MD;  Location: [place] [facility];  Service:  Cardiovascular;  Laterality: N/A;   Family History  Problem Relation Age of Onset  . Diabetes Other    Social History  Substance Use Topics  . Smoking status: Current Every Day Smoker -- 2.50 packs/day for 56 years    Types: E-cigarettes, Cigarettes  . Smokeless tobacco: None     Comment: smoking 1/3ppd now (04/22/16)  . Alcohol Use: 12.6 oz/week    21 Shots of liquor per week     Comment: nightly    Review of Systems A complete 10 system review of systems was obtained and all systems are negative except as noted in the HPI and PMH.    Allergies  Review of patient's allergies indicates no known allergies.  Home Medications   Prior to Admission medications   Medication Sig Start Date End Date Taking? Authorizing Provider  albuterol (PROVENTIL) (2.5 MG/3ML) 0.083% nebulizer solution Take 3 mLs (2.5 mg total) by nebulization every 4 (four) hours as needed for wheezing or shortness of breath. 04/01/16   Eileen Stanford, PA-C  aspirin EC 81 MG EC tablet Take 1 tablet (81 mg total) by mouth daily. 04/01/16   Eileen Stanford, PA-C  atorvastatin (LIPITOR) 40 MG tablet Take 1 tablet (40 mg total) by mouth daily at 6 PM. 04/01/16   Eileen Stanford, PA-C  carvedilol (COREG) 25 MG tablet Take 25 mg by mouth 2 (two) times daily with a meal.  01/23/16   Historical Provider, MD  furosemide (LASIX) 40 MG tablet Take 40 mg by mouth 2 (  two) times daily.     Historical Provider, MD  guaiFENesin (MUCINEX) 600 MG 12 hr tablet Take 1 tablet (600 mg total) by mouth 2 (two) times daily. 03/28/16   Kathie Dike, MD  LORazepam (ATIVAN) 2 MG tablet Take 1 tablet by mouth every 6 (six) hours as needed for anxiety.  10/01/15   Historical Provider, MD  nicotine (NICODERM CQ - DOSED IN MG/24 HOURS) 21 mg/24hr patch Place 1 patch (21 mg total) onto the skin daily. Patient not taking: Reported on 05/13/2016 03/28/16   Kathie Dike, MD  oxyCODONE-acetaminophen (PERCOCET/ROXICET) 5-325 MG tablet Take 1-2  tablets by mouth every 6 (six) hours as needed. Patient taking differently: Take 1-2 tablets by mouth every 6 (six) hours as needed for moderate pain.  01/20/16   Kristen N Ward, DO  potassium chloride SA (K-DUR,KLOR-CON) 20 MEQ tablet Take 1 tablet by mouth daily. 10/14/15   Historical Provider, MD  predniSONE (DELTASONE) 10 MG tablet Take '40mg'$  po daily for 2 days then '30mg'$  daily for 2 days then '20mg'$  daily for 2 days then '10mg'$  daily for 2 days then stop 03/28/16   Kathie Dike, MD  PROAIR HFA 108 [phone] Base) MCG/ACT inhaler Inhale 2 puffs into the lungs every 4 (four) hours as needed for wheezing or shortness of breath. 03/28/16   Kathie Dike, MD  rivaroxaban (XARELTO) 20 MG TABS tablet Take 1 tablet (20 mg total) by mouth daily with supper. 04/01/16   Eileen Stanford, PA-C  rOPINIRole (REQUIP) 0.25 MG tablet Take 1-2 tablets by mouth at bedtime as needed (restless leg).  02/25/16   Historical Provider, MD   BP 99/43 mmHg  Pulse 69  Temp(Src) 97.9 F (36.6 C) (Oral)  Resp 20  Ht '5\' 10"'$  (1.778 m)  Wt 166 lb (75.297 kg)  BMI 23.82 kg/m2  SpO2 95% Physical Exam  Constitutional: He is oriented to person, place, and time. He appears well-developed and well-nourished. No distress.  Ill-appearing.  HENT:  Head: Normocephalic and atraumatic.  Mouth/Throat: Oropharynx is clear and moist. No oropharyngeal exudate.  Eyes: Conjunctivae and EOM are normal. Pupils are equal, round, and reactive to light.  Neck: Normal range of motion. Neck supple.  No meningismus.  Cardiovascular: Normal rate, regular rhythm, normal heart sounds and intact distal pulses.   No murmur heard. Pulmonary/Chest: Effort normal and breath sounds normal. No respiratory distress.  Abdominal: Soft. There is no tenderness. There is no rebound and no guarding.  Genitourinary: Guaiac negative stool.  No blood noted on rectal examination. Minimal amount of stool obtained.  Musculoskeletal: Normal range of motion. He exhibits no  edema or tenderness.  Neurological: He is alert and oriented to person, place, and time. No cranial nerve deficit. He exhibits normal muscle tone. Coordination normal.  No ataxia on finger to nose bilaterally. No pronator drift. 5/5 strength throughout. CN 2-12 intact.Equal grip strength. Sensation intact.   Skin: Skin is warm. There is pallor.  Pale.  Psychiatric: He has a normal mood and affect. His behavior is normal.  Nursing note and vitals reviewed.   ED Course  Procedures (including critical care time) DIAGNOSTIC STUDIES: Oxygen Saturation is 95% on nasal canula, normal by my interpretation.    COORDINATION OF CARE: 10:22 PM Discussed treatment plan which includes EKG, CBC with differential/platelet, CMP, troponin I, and urinalysis with pt at bedside and pt agreed to plan.   Labs Review Labs Reviewed  CBC WITH DIFFERENTIAL/PLATELET - Abnormal; Notable for the following:    RBC  1.90 (*)    Hemoglobin 4.2 (*)    HCT 14.3 (*)    MCV 75.3 (*)    MCH 22.1 (*)    MCHC 29.4 (*)    RDW 18.5 (*)    All other components within normal limits  COMPREHENSIVE METABOLIC PANEL - Abnormal; Notable for the following:    Sodium 127 (*)    Potassium 2.9 (*)    Chloride 88 (*)    Glucose, Bld 120 (*)    BUN 43 (*)    Creatinine, Ser 1.39 (*)    Calcium 7.9 (*)    Total Protein 5.9 (*)    Albumin 3.2 (*)    Total Bilirubin 1.5 (*)    GFR calc non Af Amer 52 (*)    All other components within normal limits  PROTIME-INR - Abnormal; Notable for the following:    Prothrombin Time 64.1 (*)    INR 8.02 (*)    All other components within normal limits  URINALYSIS, ROUTINE W REFLEX MICROSCOPIC (NOT AT [facility]) - Abnormal; Notable for the following:    Specific Gravity, Urine <1.005 (*)    All other components within normal limits  MRSA PCR SCREENING  TROPONIN I  TSH  SODIUM, URINE, RANDOM  OSMOLALITY, URINE  CBC  COMPREHENSIVE METABOLIC PANEL  VITAMIN C94  FERRITIN  FOLATE RBC   OSMOLALITY  PROTEIN ELECTROPHORESIS, SERUM  POC OCCULT BLOOD, ED  TYPE AND SCREEN  PREPARE RBC (CROSSMATCH)  ABO/RH    Imaging Review Dg Chest Portable 1 View  05/25/2016  CLINICAL DATA:  Acute onset of dizziness and lightheadedness. Generalized weakness and fatigue. Bilateral leg swelling. Shortness of breath. Initial encounter. EXAM: PORTABLE CHEST 1 VIEW COMPARISON:  Chest radiograph and CTA of the chest performed 03/29/2016 FINDINGS: The lungs are well-aerated. Vascular congestion is noted. There is no evidence of focal opacification, pleural effusion or pneumothorax. The cardiomediastinal silhouette is within normal limits. A pacemaker is noted overlying the left chest wall, with leads ending overlying the right atrium and right ventricle. No acute osseous abnormalities are seen. IMPRESSION: Vascular congestion noted.  Lungs remain grossly clear. Electronically Signed   By: Garald Balding M.D.   On: 05/25/2016 23:16   I have personally reviewed and evaluated these images and lab results as part of my medical decision-making.   EKG Interpretation   Date/Time:  Tuesday May 25 2016 21:59:50 EDT Ventricular Rate:  67 PR Interval:    QRS Duration: 183 QT Interval:  491 QTC Calculation: 519 R Axis:   -156 Text Interpretation:  A-V dual-paced rhythm with some inhibition No  further analysis attempted due to paced rhythm No significant change was  found Confirmed by Wyvonnia Dusky  MD, [place] [id number]) on 05/25/2016 11:07:51 PM      MDM   Final diagnoses:  Symptomatic anemia  sent by PCP with anemia shortness of breath and generalized weakness. Denies any chest pain. Endorses some shortness of breath and dizziness. May have had black stool.  No stool in vault. FOBT negative. No significant pericardial effusion on [place].  Hb 4.  INR 8.  On xarelto but would not expect INR to be elevated to this degree.  BP soft but improved. Mentation normal.  No indication for emergent xarelto reversal.    Type and cross for 2 units.  BP 11/59  Admission d/w Dr. Maudie Mercury    EMERGENCY DEPARTMENT [place] CARDIAC EXAM "Study: Limited Ultrasound of the heart and pericardium"  INDICATIONS:Dyspnea Multiple views of the heart and  pericardium are obtained with a multi-frequency probe.  PERFORMED AN:VBTYOM  IMAGES ARCHIVED?: Yes  FINDINGS: Small effusion, Normal contractility and Tamponade physiology absent  LIMITATIONS:  Body habitus and Emergent procedure  VIEWS USED: Subcostal 4 chamber and Apical 4 chamber   INTERPRETATION: Cardiac activity present, Pericardial effusioin absent and Cardiac tamponade absent  COMMENT:    CRITICAL CARE Performed by: Ezequiel Essex Total critical care time: 35 minutes Critical care time was exclusive of separately billable procedures and treating other patients. Critical care was necessary to treat or prevent imminent or life-threatening deterioration. Critical care was time spent personally by me on the following activities: development of treatment plan with patient and/or surrogate as well as nursing, discussions with consultants, evaluation of patient's response to treatment, examination of patient, obtaining history from patient or surrogate, ordering and performing treatments and interventions, ordering and review of laboratory studies, ordering and review of radiographic studies, pulse oximetry and re-evaluation of patient's condition.  I personally performed the services described in this documentation, which was scribed in my presence. The recorded information has been reviewed and is accurate.    Ezequiel Essex, MD 05/26/16 [phone]

## 2016-05-25 NOTE — ED Notes (Signed)
Pt c/o dizziness and states he went to the doctor today due to being weak. Per doctor's office hemoglobin was around 4.

## 2016-05-26 ENCOUNTER — Encounter (HOSPITAL_COMMUNITY): Payer: Self-pay

## 2016-05-26 DIAGNOSIS — E876 Hypokalemia: Secondary | ICD-10-CM

## 2016-05-26 DIAGNOSIS — Z7901 Long term (current) use of anticoagulants: Secondary | ICD-10-CM

## 2016-05-26 DIAGNOSIS — D509 Iron deficiency anemia, unspecified: Secondary | ICD-10-CM

## 2016-05-26 DIAGNOSIS — D649 Anemia, unspecified: Secondary | ICD-10-CM

## 2016-05-26 DIAGNOSIS — K921 Melena: Secondary | ICD-10-CM

## 2016-05-26 LAB — HEMOGLOBIN AND HEMATOCRIT, BLOOD
HEMATOCRIT: 25.7 % — AB (ref 39.0–52.0)
Hemoglobin: 8.3 g/dL — ABNORMAL LOW (ref 13.0–17.0)

## 2016-05-26 LAB — PREPARE RBC (CROSSMATCH)

## 2016-05-26 LAB — CBC
HCT: 18.8 % — ABNORMAL LOW (ref 39.0–52.0)
HEMOGLOBIN: 5.7 g/dL — AB (ref 13.0–17.0)
MCH: 23.9 pg — ABNORMAL LOW (ref 26.0–34.0)
MCHC: 30.3 g/dL (ref 30.0–36.0)
MCV: 79 fL (ref 78.0–100.0)
PLATELETS: 228 10*3/uL (ref 150–400)
RBC: 2.38 MIL/uL — AB (ref 4.22–5.81)
RDW: 17.9 % — ABNORMAL HIGH (ref 11.5–15.5)
WBC: 6.5 10*3/uL (ref 4.0–10.5)

## 2016-05-26 LAB — OSMOLALITY, URINE: Osmolality, Ur: 390 mOsm/kg (ref 300–900)

## 2016-05-26 LAB — URINALYSIS, ROUTINE W REFLEX MICROSCOPIC
Bilirubin Urine: NEGATIVE
GLUCOSE, UA: NEGATIVE mg/dL
HGB URINE DIPSTICK: NEGATIVE
KETONES UR: NEGATIVE mg/dL
Leukocytes, UA: NEGATIVE
Nitrite: NEGATIVE
PH: 5.5 (ref 5.0–8.0)
PROTEIN: NEGATIVE mg/dL
Specific Gravity, Urine: <1.005 — ABNORMAL LOW (ref 1.005–1.030)

## 2016-05-26 LAB — COMPREHENSIVE METABOLIC PANEL
ALK PHOS: 57 U/L (ref 38–126)
ALT: 15 U/L — AB (ref 17–63)
AST: 25 U/L (ref 15–41)
Albumin: 2.9 g/dL — ABNORMAL LOW (ref 3.5–5.0)
Anion gap: 5 (ref 5–15)
BUN: 35 mg/dL — AB (ref 6–20)
CHLORIDE: 94 mmol/L — AB (ref 101–111)
CO2: 30 mmol/L (ref 22–32)
CREATININE: 1.01 mg/dL (ref 0.61–1.24)
Calcium: 7.6 mg/dL — ABNORMAL LOW (ref 8.9–10.3)
GFR calc Af Amer: >60 mL/min (ref >60)
GFR calc non Af Amer: >60 mL/min (ref >60)
GLUCOSE: 100 mg/dL — AB (ref 65–99)
Potassium: 3.4 mmol/L — ABNORMAL LOW (ref 3.5–5.1)
SODIUM: 129 mmol/L — AB (ref 135–145)
Total Bilirubin: 2.6 mg/dL — ABNORMAL HIGH (ref 0.3–1.2)
Total Protein: 5.3 g/dL — ABNORMAL LOW (ref 6.5–8.1)

## 2016-05-26 LAB — VITAMIN B12: Vitamin B-12: 756 pg/mL (ref 180–914)

## 2016-05-26 LAB — OSMOLALITY: OSMOLALITY: 281 mosm/kg (ref 275–295)

## 2016-05-26 LAB — MAGNESIUM: Magnesium: 1.7 mg/dL (ref 1.7–2.4)

## 2016-05-26 LAB — SODIUM, URINE, RANDOM

## 2016-05-26 LAB — MRSA PCR SCREENING: MRSA BY PCR: NEGATIVE

## 2016-05-26 LAB — TSH: TSH: 0.755 u[IU]/mL (ref 0.350–4.500)

## 2016-05-26 LAB — FERRITIN: Ferritin: 24 ng/mL (ref 24–336)

## 2016-05-26 MED ORDER — CHLORDIAZEPOXIDE HCL 25 MG PO CAPS
50.0000 mg | ORAL_CAPSULE | Freq: Once | ORAL | Status: AC
  Administered 2016-05-26: 50 mg via ORAL
  Filled 2016-05-26: qty 2

## 2016-05-26 MED ORDER — VITAMIN K1 10 MG/ML IJ SOLN
5.0000 mg | Freq: Once | INTRAMUSCULAR | Status: AC
  Administered 2016-05-26: 5 mg via SUBCUTANEOUS
  Filled 2016-05-26: qty 1

## 2016-05-26 MED ORDER — SODIUM CHLORIDE 0.9 % IV SOLN: INTRAVENOUS | Status: AC

## 2016-05-26 MED ORDER — PHYTONADIONE 5 MG PO TABS
5.0000 mg | ORAL_TABLET | Freq: Once | ORAL | Status: AC
  Administered 2016-05-26: 5 mg via ORAL
  Filled 2016-05-26: qty 1

## 2016-05-26 MED ORDER — PHYTONADIONE 5 MG PO TABS
ORAL_TABLET | ORAL | Status: AC
  Filled 2016-05-26: qty 1

## 2016-05-26 MED ORDER — NICOTINE 21 MG/24HR TD PT24
21.0000 mg | MEDICATED_PATCH | Freq: Every day | TRANSDERMAL | Status: DC
  Administered 2016-05-26 – 2016-05-28 (×3): 21 mg via TRANSDERMAL
  Filled 2016-05-26 (×3): qty 1

## 2016-05-26 MED ORDER — OXYCODONE HCL 5 MG PO TABS
5.0000 mg | ORAL_TABLET | Freq: Four times a day (QID) | ORAL | Status: DC | PRN
  Administered 2016-05-26 – 2016-05-27 (×2): 5 mg via ORAL
  Filled 2016-05-26 (×2): qty 1

## 2016-05-26 MED ORDER — SODIUM CHLORIDE 0.9 % IV SOLN
Freq: Once | INTRAVENOUS | Status: AC
  Administered 2016-05-26: 10:00:00 via INTRAVENOUS

## 2016-05-26 MED ORDER — LORAZEPAM 1 MG PO TABS
1.0000 mg | ORAL_TABLET | Freq: Three times a day (TID) | ORAL | Status: DC | PRN
  Administered 2016-05-26 – 2016-05-28 (×3): 1 mg via ORAL
  Filled 2016-05-26 (×3): qty 1

## 2016-05-26 MED ORDER — LORAZEPAM 2 MG/ML IJ SOLN: 1.0000 mg | INTRAMUSCULAR | Status: DC | PRN

## 2016-05-26 MED ORDER — SODIUM CHLORIDE 0.9% FLUSH
3.0000 mL | Freq: Two times a day (BID) | INTRAVENOUS | Status: DC
  Administered 2016-05-26 – 2016-05-28 (×2): 3 mL via INTRAVENOUS

## 2016-05-26 MED ORDER — ATORVASTATIN CALCIUM 40 MG PO TABS
40.0000 mg | ORAL_TABLET | Freq: Every day | ORAL | Status: DC
  Administered 2016-05-26 – 2016-05-27 (×2): 40 mg via ORAL
  Filled 2016-05-26 (×2): qty 1

## 2016-05-26 MED ORDER — ACETAMINOPHEN 325 MG PO TABS
650.0000 mg | ORAL_TABLET | Freq: Four times a day (QID) | ORAL | Status: DC | PRN
  Administered 2016-05-26: 650 mg via ORAL
  Filled 2016-05-26: qty 2

## 2016-05-26 MED ORDER — ALBUTEROL SULFATE (2.5 MG/3ML) 0.083% IN NEBU: 2.5000 mg | INHALATION_SOLUTION | RESPIRATORY_TRACT | Status: DC | PRN

## 2016-05-26 MED ORDER — SODIUM CHLORIDE 0.9 % IV SOLN: Freq: Once | INTRAVENOUS | Status: DC

## 2016-05-26 MED ORDER — ROPINIROLE HCL 0.25 MG PO TABS
0.2500 mg | ORAL_TABLET | Freq: Every day | ORAL | Status: DC
  Administered 2016-05-26: 0.75 mg via ORAL
  Administered 2016-05-27: 0.5 mg via ORAL
  Filled 2016-05-26 (×6): qty 3

## 2016-05-26 MED ORDER — ACETAMINOPHEN 650 MG RE SUPP: 650.0000 mg | Freq: Four times a day (QID) | RECTAL | Status: DC | PRN

## 2016-05-26 MED ORDER — POTASSIUM CHLORIDE CRYS ER 20 MEQ PO TBCR
20.0000 meq | EXTENDED_RELEASE_TABLET | Freq: Every day | ORAL | Status: DC
  Administered 2016-05-26 – 2016-05-28 (×3): 20 meq via ORAL
  Filled 2016-05-26 (×3): qty 1

## 2016-05-26 MED ORDER — POTASSIUM CHLORIDE 10 MEQ/100ML IV SOLN
10.0000 meq | INTRAVENOUS | Status: AC
  Administered 2016-05-26 (×2): 10 meq via INTRAVENOUS
  Filled 2016-05-26 (×2): qty 100

## 2016-05-26 NOTE — Progress Notes (Signed)
PROGRESS NOTE    Shaun May  QMV:[id number] DOB: 01-May-1951 DOA: 05/25/2016 PCP: Wende Neighbors, MD     Brief Narrative:  65 y/o man admitted on 6/20 from his PCPs office with a Hb of 4.2. Has PAF and has been on Xarelto.   Assessment & Plan:   Principal Problem:   Anemia Active Problems:   Symptomatic anemia   Hypokalemia   Hyponatremia   Anemia -Unfortunately anemia panel not drawn prior to transfusion. -With Hb of 9.9 on 04/13/16, most likely explanation for such a brisk decrease in Hb would be loss of blood, likely from the GI tract. -Patient denies frequent use of NSAIDs, no reflux symptoms. -He does state that about 2 weeks ago noticed a big dark stool but hasn't really been paying attention to the color of his stools since. -Has received 2 units of PRBCs so far, with Hb only increasing to 5.7. Will request additional 3 units to be transfused today. -GI consultation has been requested for consideration of endoscopic studies.  PAF -Currently paced. -Rate controlled. -Xarelto on hold.  Hypokalemia -Replete IV. -Check Mg level.  ARF -Resolved with IVF and PRBCs, likely pre-renal azotemia.   DVT prophylaxis: SCDs Code Status: Full code Family Communication: significant other at bedside updated on plan of care Disposition Plan: Keep in ICU today  Consultants:   GI  Procedures:   None  Antimicrobials:   None    Subjective: C/o dizziness and weakness  Objective: Filed Vitals:   05/26/16 0645 05/26/16 0700 05/26/16 0757 05/26/16 0800  BP: 109/65 112/61  116/57  Pulse: 59 59    Temp:   97.2 F (36.2 C)   TempSrc:   Oral   Resp: '21 21  20  '$ Height:      Weight:      SpO2: 100% 100%      Intake/Output Summary (Last 24 hours) at 05/26/16 1007 Last data filed at 05/26/16 0800  Gross per 24 hour  Intake 1405.84 ml  Output    700 ml  Net 705.84 ml   Filed Weights   05/25/16 2132 05/26/16 0118  Weight: 75.297 kg (166 lb) 76.2 kg (167 lb 15.9 oz)     Examination:  General exam: Alert, awake, oriented x 3, pale Respiratory system: Clear to auscultation. Respiratory effort normal. Cardiovascular system:RRR. No murmurs, rubs, gallops. Gastrointestinal system: Abdomen is nondistended, soft and nontender. No organomegaly or masses felt. Normal bowel sounds heard. Central nervous system: Alert and oriented. No focal neurological deficits. Extremities: No C/C/E, +pedal pulses Skin: No rashes, lesions or ulcers Psychiatry: Judgement and insight appear normal. Mood & affect appropriate.     Data Reviewed: I have personally reviewed following labs and imaging studies  CBC:  Recent Labs Lab 05/25/16 2210 05/26/16 0803  WBC 6.8 6.5  NEUTROABS 5.2  --   HGB 4.2* 5.7*  HCT 14.3* 18.8*  MCV 75.3* 79.0  PLT 244 284   Basic Metabolic Panel:  Recent Labs Lab 05/25/16 2210 05/26/16 0803  NA 127* 129*  K 2.9* 3.4*  CL 88* 94*  CO2 30 30  GLUCOSE 120* 100*  BUN 43* 35*  CREATININE 1.39* 1.01  CALCIUM 7.9* 7.6*   GFR: Estimated Creatinine Clearance: 76.3 mL/min (by C-G formula based on Cr of 1.01). Liver Function Tests:  Recent Labs Lab 05/25/16 2210 05/26/16 0803  AST 29 25  ALT 20 15*  ALKPHOS 68 57  BILITOT 1.5* 2.6*  PROT 5.9* 5.3*  ALBUMIN 3.2* 2.9*  No results for input(s): LIPASE, AMYLASE in the last 168 hours. No results for input(s): AMMONIA in the last 168 hours. Coagulation Profile:  Recent Labs Lab 05/25/16 2210  INR 8.02*   Cardiac Enzymes:  Recent Labs Lab 05/25/16 2210  TROPONINI 0.03   BNP (last 3 results) No results for input(s): PROBNP in the last 8760 hours. HbA1C: No results for input(s): HGBA1C in the last 72 hours. CBG: No results for input(s): GLUCAP in the last 168 hours. Lipid Profile: No results for input(s): CHOL, HDL, LDLCALC, TRIG, CHOLHDL, LDLDIRECT in the last 72 hours. Thyroid Function Tests:  Recent Labs  05/25/16 2210  TSH 0.755   Anemia Panel: No results  for input(s): VITAMINB12, FOLATE, FERRITIN, TIBC, IRON, RETICCTPCT in the last 72 hours. Urine analysis:    Component Value Date/Time   COLORURINE YELLOW 05/25/2016 2318   APPEARANCEUR CLEAR 05/25/2016 2318   LABSPEC <1.005* 05/25/2016 2318   PHURINE 5.5 05/25/2016 2318   GLUCOSEU NEGATIVE 05/25/2016 2318   HGBUR NEGATIVE 05/25/2016 2318   BILIRUBINUR NEGATIVE 05/25/2016 2318   KETONESUR NEGATIVE 05/25/2016 2318   PROTEINUR NEGATIVE 05/25/2016 2318   NITRITE NEGATIVE 05/25/2016 2318   LEUKOCYTESUR NEGATIVE 05/25/2016 2318   Sepsis Labs: '@LABRCNTIP'$ (procalcitonin:4,lacticidven:4)  ) Recent Results (from the past 240 hour(s))  MRSA PCR Screening     Status: None   Collection Time: 05/26/16 12:53 AM  Result Value Ref Range Status   MRSA by PCR NEGATIVE NEGATIVE Final    Comment:        The GeneXpert MRSA Assay (FDA approved for NASAL specimens only), is one component of a comprehensive MRSA colonization surveillance program. It is not intended to diagnose MRSA infection nor to guide or monitor treatment for MRSA infections.          Radiology Studies: Dg Chest Portable 1 View  05/25/2016  CLINICAL DATA:  Acute onset of dizziness and lightheadedness. Generalized weakness and fatigue. Bilateral leg swelling. Shortness of breath. Initial encounter. EXAM: PORTABLE CHEST 1 VIEW COMPARISON:  Chest radiograph and CTA of the chest performed 03/29/2016 FINDINGS: The lungs are well-aerated. Vascular congestion is noted. There is no evidence of focal opacification, pleural effusion or pneumothorax. The cardiomediastinal silhouette is within normal limits. A pacemaker is noted overlying the left chest wall, with leads ending overlying the right atrium and right ventricle. No acute osseous abnormalities are seen. IMPRESSION: Vascular congestion noted.  Lungs remain grossly clear. Electronically Signed   By: Garald Balding M.D.   On: 05/25/2016 23:16        Scheduled Meds: . sodium  chloride   Intravenous STAT  . sodium chloride   Intravenous Once  . atorvastatin  40 mg Oral q1800  . [START ON 05/29/2016] pantoprazole  40 mg Intravenous Q12H  . potassium chloride SA  20 mEq Oral Daily  . rOPINIRole  0.25-0.75 mg Oral QHS  . sodium chloride flush  3 mL Intravenous Q12H   Continuous Infusions: . sodium chloride    . pantoprozole (PROTONIX) infusion 8 mg/hr (05/26/16 0800)     LOS: 1 day    Time spent: 25 minutes. Greater than 50% of this time was spent in direct contact with the patient coordinating care.     Lelon Frohlich, MD [facility] Pager [phone]  If 7PM-7AM, please contact night-coverage [email or web address] Password [email or web address] 05/26/2016, 10:07 AM

## 2016-05-26 NOTE — Progress Notes (Signed)
PT CONTINUES TO CLIMB OOB DISPITE FREQUENTLY INSTRUCTION TO CALL FOR ASSIST AND BED ALARM BEING ON.FRIEND AT BEDSIDE WILL SOMETIMES CALL FOR ASSIST AFTER PT IS OOB.

## 2016-05-26 NOTE — Consult Note (Signed)
Reason for Consult:GI bleed Referring Physician: Hospitallist211  Shaun May is an 65 y.o. male.  HPI: Admitted thru the ED. Extremely weak. Could not walk. There was no pain.  He denies any rectal bleeding. No nausea, vomiting, diarrhea or BRRB per patient.  Noted to have hemoglobin of 4.2.  Has received 2 units of PRBCs. Hx of atrial flutter,  Complete heart block.,pacemaker and maintained on Xarelto. Has been on Xarelto for 5-6 weeks.  He says he occassioally has black stool but none recently.  PT/INR 64.1 and 8.02 Fecal occult blood negative in the ED.  03/29/2016 Hemoglobin was normal.  Takes ASA 47m daily.  Denies taking any other NSAIDs. ` Hx of etoh abuse and drinks 3-4 small cup liquor a night. Has cut back x 2 weeks   CBC    Component Value Date/Time   WBC 6.8 05/25/2016 2210   RBC 1.90* 05/25/2016 2210   HGB 4.2* 05/25/2016 2210   HCT 14.3* 05/25/2016 2210   PLT 244 05/25/2016 2210   MCV 75.3* 05/25/2016 2210   MCH 22.1* 05/25/2016 2210   MCHC 29.4* 05/25/2016 2210   RDW 18.5* 05/25/2016 2210   LYMPHSABS 0.9 05/25/2016 2210   MONOABS 0.6 05/25/2016 2210   EOSABS 0.1 05/25/2016 2210   BASOSABS 0.0 05/25/2016 2210      Past Medical History  Diagnosis Date  . Hypertension   . Hemochromatosis   . Mobitz type 2 second degree heart block     PPM MDT 10/28/15 Dr. TLovena Le . Atrial flutter ([place]   . Pericardial effusion   . Lung nodule     Past Surgical History  Procedure Laterality Date  . Ep implantable device N/A 10/28/2015    Procedure: Pacemaker Implant;  Surgeon: GEvans Lance MD;  [facility];  Service: Cardiovascular;  Laterality: N/A;  . Ep implantable device N/A 11/07/2015    Procedure: Pocket Revision;  Surgeon: GEvans Lance MD;  [facility];  Service: Cardiovascular;  Laterality: N/A;  . Cardiac catheterization N/A 03/31/2016    Procedure: Left Heart Cath and Coronary Angiography;  Surgeon: MWellington Hampshire  MD;  [facility];  Service: Cardiovascular;  Laterality: N/A;  . Tonsillectomy    . Colonoscopy      Family History  Problem Relation Age of Onset  . Diabetes Other   . Dementia Mother     Social History:  reports that he has been smoking E-cigarettes and Cigarettes.  He has a 140 pack-year smoking history. He does not have any smokeless tobacco history on file. He reports that he drinks about 12.6 oz of alcohol per week. He reports that he does not use illicit drugs.  Allergies: No Known Allergies  Medications: I have reviewed the patient's current medications.  Results for orders placed or performed during the hospital encounter of 05/25/16 (from the past 48 hour(s))  CBC with Differential/Platelet     Status: Abnormal   Collection Time: 05/25/16 10:10 PM  Result Value Ref Range   WBC 6.8 4.0 - 10.5 K/uL   RBC 1.90 (L) 4.22 - 5.81 MIL/uL   Hemoglobin 4.2 (LL) 13.0 - 17.0 g/dL    Comment: RESULT REPEATED AND VERIFIED CRITICAL RESULT CALLED TO, READ BACK BY AND VERIFIED WITH: TUTTLE,A AT 2230 ON 05/25/2016 BY ISLEY,B    HCT 14.3 (L) 39.0 - 52.0 %   MCV 75.3 (L) 78.0 - 100.0 fL   MCH 22.1 (L) 26.0 - 34.0  pg   MCHC 29.4 (L) 30.0 - 36.0 g/dL   RDW 18.5 (H) 11.5 - 15.5 %   Platelets 244 150 - 400 K/uL   Neutrophils Relative % 77 %   Neutro Abs 5.2 1.7 - 7.7 K/uL   Lymphocytes Relative 14 %   Lymphs Abs 0.9 0.7 - 4.0 K/uL   Monocytes Relative 8 %   Monocytes Absolute 0.6 0.1 - 1.0 K/uL   Eosinophils Relative 1 %   Eosinophils Absolute 0.1 0.0 - 0.7 K/uL   Basophils Relative 0 %   Basophils Absolute 0.0 0.0 - 0.1 K/uL  Comprehensive metabolic panel     Status: Abnormal   Collection Time: 05/25/16 10:10 PM  Result Value Ref Range   Sodium 127 (L) 135 - 145 mmol/L   Potassium 2.9 (L) 3.5 - 5.1 mmol/L   Chloride 88 (L) 101 - 111 mmol/L   CO2 30 22 - 32 mmol/L   Glucose, Bld 120 (H) 65 - 99 mg/dL   BUN 43 (H) 6 - 20 mg/dL   Creatinine, Ser 1.39 (H) 0.61 -  1.24 mg/dL   Calcium 7.9 (L) 8.9 - 10.3 mg/dL   Total Protein 5.9 (L) 6.5 - 8.1 g/dL   Albumin 3.2 (L) 3.5 - 5.0 g/dL   AST 29 15 - 41 U/L   ALT 20 17 - 63 U/L   Alkaline Phosphatase 68 38 - 126 U/L   Total Bilirubin 1.5 (H) 0.3 - 1.2 mg/dL   GFR calc non Af Amer 52 (L) >60 mL/min   GFR calc Af Amer >60 >60 mL/min    Comment: (NOTE) The eGFR has been calculated using the CKD EPI equation. This calculation has not been validated in all clinical situations. eGFR's persistently <60 mL/min signify possible Chronic Kidney Disease.    Anion gap 9 5 - 15  Protime-INR     Status: Abnormal   Collection Time: 05/25/16 10:10 PM  Result Value Ref Range   Prothrombin Time 64.1 (H) 11.6 - 15.2 seconds   INR 8.02 (HH) 0.00 - 1.49    Comment: CRITICAL RESULT CALLED TO, READ BACK BY AND VERIFIED WITH: TUTTLE,A AT 2255 ON 05/25/2016 BY ISLEY,B   Type and screen [facility]     Status: None (Preliminary result)   Collection Time: 05/25/16 10:10 PM  Result Value Ref Range   ABO/RH(D) O POS    Antibody Screen NEG    Sample Expiration 05/28/2016    Unit Number [id number]    Blood Component Type RED CELLS,LR    Unit division 00    Status of Unit ISSUED    Transfusion Status OK TO TRANSFUSE    Crossmatch Result Compatible    Unit Number [id number]    Blood Component Type RED CELLS,LR    Unit division 00    Status of Unit ISSUED    Transfusion Status OK TO TRANSFUSE    Crossmatch Result Compatible   Troponin I     Status: None   Collection Time: 05/25/16 10:10 PM  Result Value Ref Range   Troponin I 0.03 <0.031 ng/mL    Comment:        NO INDICATION OF MYOCARDIAL INJURY.   ABO/Rh     Status: None   Collection Time: 05/25/16 10:10 PM  Result Value Ref Range   ABO/RH(D) O POS   TSH     Status: None   Collection Time: 05/25/16 10:10 PM  Result Value Ref Range   TSH 0.755 0.350 -  4.500 uIU/mL  POC occult blood, ED Provider will collect     Status: None   Collection Time:  05/25/16 10:39 PM  Result Value Ref Range   Fecal Occult Bld NEGATIVE NEGATIVE  Prepare RBC     Status: None   Collection Time: 05/25/16 11:00 PM  Result Value Ref Range   Order Confirmation ORDER PROCESSED BY BLOOD BANK   Urinalysis, Routine w reflex microscopic (not at [facility])     Status: Abnormal   Collection Time: 05/25/16 11:18 PM  Result Value Ref Range   Color, Urine YELLOW YELLOW   APPearance CLEAR CLEAR   Specific Gravity, Urine <1.005 (L) 1.005 - 1.030   pH 5.5 5.0 - 8.0   Glucose, UA NEGATIVE NEGATIVE mg/dL   Hgb urine dipstick NEGATIVE NEGATIVE   Bilirubin Urine NEGATIVE NEGATIVE   Ketones, ur NEGATIVE NEGATIVE mg/dL   Protein, ur NEGATIVE NEGATIVE mg/dL   Nitrite NEGATIVE NEGATIVE   Leukocytes, UA NEGATIVE NEGATIVE    Comment: MICROSCOPIC NOT DONE ON URINES WITH NEGATIVE PROTEIN, BLOOD, LEUKOCYTES, NITRITE, OR GLUCOSE <1000 mg/dL.  Sodium, urine, random     Status: None   Collection Time: 05/25/16 11:18 PM  Result Value Ref Range   Sodium, Ur <10 mmol/L  MRSA PCR Screening     Status: None   Collection Time: 05/26/16 12:53 AM  Result Value Ref Range   MRSA by PCR NEGATIVE NEGATIVE    Comment:        The GeneXpert MRSA Assay (FDA approved for NASAL specimens only), is one component of a comprehensive MRSA colonization surveillance program. It is not intended to diagnose MRSA infection nor to guide or monitor treatment for MRSA infections.                                                                                                    Dg Chest Portable 1 View  05/25/2016  CLINICAL DATA:  Acute onset of dizziness and lightheadedness. Generalized weakness and fatigue. Bilateral leg swelling. Shortness of breath. Initial encounter. EXAM: PORTABLE CHEST 1 VIEW COMPARISON:  Chest radiograph and CTA of the chest performed 03/29/2016 FINDINGS: The lungs are well-aerated. Vascular congestion is noted. There is no evidence of focal opacification, pleural effusion or  pneumothorax. The cardiomediastinal silhouette is within normal limits. A pacemaker is noted overlying the left chest wall, with leads ending overlying the right atrium and right ventricle. No acute osseous abnormalities are seen. IMPRESSION: Vascular congestion noted.  Lungs remain grossly clear. Electronically Signed   By: Garald Balding M.D.   On: 05/25/2016 23:16    ROS Blood pressure 109/65, pulse 59, temperature 97.7 F (36.5 C), temperature source Oral, resp. rate 21, height _0  (1.778 m), weight 167 lb 15.9 oz (76.2 kg), SpO2 100 %. Physical Exam  Alert and oriented. Skin warm and dry. Oral mucosa is moist.   . Sclera anicteric, conjunctivae is pink. Thyroid not enlarged. No cervical lymphadenopathy. Bilateral wheezes.   cl Heart regular rate and rhythm.  Abdomen is soft. Bowel sounds are positive. No hepatomegaly. No abdominal masses felt. No tenderness.  2+ edema to lower extremities.  Assessment: Anemia. ? Etiology. Iron studies are pending. Last colonoscopy greater than 10 yrs ago.  Will discuss with Dr. Laural Golden.   SETZER,TERRI W 05/26/2016, 7:55 AM    GI attending note: Patient interviewed and examined. He presents with profound anemia with low serum ferritin concerning for iron deficiency anemia stool is guaiac negative. He could easily bled few days ago. Patient is on low-dose aspirin and Eliquis. He has received total of 5 units of PRBCs. Patient has hemochromatosis and has not had phlebotomy in about 8 months. He may well have chronic GI blood loss. Will need to correct coagulopathy before endoscopic evaluation undertaken. Differential diagnosis includes peptic ulcer disease GI angiodysplasia or neoplasm involving upper or lower GI tract. Will plan EGD as an as patient deemed to be stable. As discussed with Dr. Jerilee Hoh will give patient 5 mg of vitamin K subcutaneously.

## 2016-05-27 ENCOUNTER — Encounter (HOSPITAL_COMMUNITY)
Admission: EM | Disposition: A | Discharge status: Home / Self Care | Payer: Self-pay | Source: Home / Self Care | Attending: Internal Medicine

## 2016-05-27 ENCOUNTER — Encounter (HOSPITAL_COMMUNITY): Payer: Self-pay | Admitting: *Deleted

## 2016-05-27 DIAGNOSIS — D509 Iron deficiency anemia, unspecified: Secondary | ICD-10-CM

## 2016-05-27 DIAGNOSIS — E871 Hypo-osmolality and hyponatremia: Secondary | ICD-10-CM

## 2016-05-27 DIAGNOSIS — K228 Other specified diseases of esophagus: Secondary | ICD-10-CM

## 2016-05-27 DIAGNOSIS — Z7901 Long term (current) use of anticoagulants: Secondary | ICD-10-CM

## 2016-05-27 DIAGNOSIS — K921 Melena: Secondary | ICD-10-CM

## 2016-05-27 DIAGNOSIS — K259 Gastric ulcer, unspecified as acute or chronic, without hemorrhage or perforation: Secondary | ICD-10-CM

## 2016-05-27 HISTORY — PX: ESOPHAGOGASTRODUODENOSCOPY: SHX5428

## 2016-05-27 LAB — HEMOGLOBIN AND HEMATOCRIT, BLOOD
HCT: 26.9 % — ABNORMAL LOW (ref 39.0–52.0)
Hemoglobin: 8.4 g/dL — ABNORMAL LOW (ref 13.0–17.0)

## 2016-05-27 LAB — IRON AND TIBC
IRON: 44 ug/dL — AB (ref 45–182)
Saturation Ratios: 10 % — ABNORMAL LOW (ref 17.9–39.5)
TIBC: 435 ug/dL (ref 250–450)
UIBC: 391 ug/dL

## 2016-05-27 LAB — PROTIME-INR
INR: 1.85 — ABNORMAL HIGH (ref 0.00–1.49)
PROTHROMBIN TIME: 21.3 s — AB (ref 11.6–15.2)

## 2016-05-27 LAB — FOLATE RBC
FOLATE, HEMOLYSATE: 338.3 ng/mL
FOLATE, RBC: 1829 ng/mL (ref >498)
Hematocrit: 18.5 % — ABNORMAL LOW (ref 37.5–51.0)

## 2016-05-27 SURGERY — EGD (ESOPHAGOGASTRODUODENOSCOPY)
Anesthesia: Moderate Sedation

## 2016-05-27 MED ORDER — MEPERIDINE HCL 50 MG/ML IJ SOLN
INTRAMUSCULAR | Status: AC
  Filled 2016-05-27: qty 1

## 2016-05-27 MED ORDER — BUTAMBEN-TETRACAINE-BENZOCAINE 2-2-14 % EX AERO
INHALATION_SPRAY | CUTANEOUS | Status: DC | PRN
  Administered 2016-05-27: 2 via TOPICAL

## 2016-05-27 MED ORDER — PANTOPRAZOLE SODIUM 40 MG PO TBEC
40.0000 mg | DELAYED_RELEASE_TABLET | Freq: Two times a day (BID) | ORAL | Status: DC
  Administered 2016-05-27 – 2016-05-28 (×2): 40 mg via ORAL
  Filled 2016-05-27 (×2): qty 1

## 2016-05-27 MED ORDER — MEPERIDINE HCL 50 MG/ML IJ SOLN
INTRAMUSCULAR | Status: DC | PRN
  Administered 2016-05-27 (×2): 25 mg

## 2016-05-27 MED ORDER — MIDAZOLAM HCL 5 MG/5ML IJ SOLN
INTRAMUSCULAR | Status: AC
  Filled 2016-05-27: qty 10

## 2016-05-27 MED ORDER — MIDAZOLAM HCL 5 MG/5ML IJ SOLN
INTRAMUSCULAR | Status: DC | PRN
  Administered 2016-05-27 (×2): 2 mg via INTRAVENOUS

## 2016-05-27 MED ORDER — SODIUM CHLORIDE 0.9% FLUSH
INTRAVENOUS | Status: AC
  Filled 2016-05-27: qty 10

## 2016-05-27 NOTE — Progress Notes (Signed)
PT IS MORE COMP;AINT W/ CALLING FOR ASSISTANCE TO GET OOB.

## 2016-05-27 NOTE — Op Note (Addendum)
[facility] Patient Name: Shaun May Procedure Date: 05/27/2016 11:26 AM MRN: [id number] Date of Birth: Sep 30, 1951 Attending MD: Hildred Laser , MD CSN: [id number] Age: 65 Admit Type: Outpatient Procedure:                Upper GI endoscopy Indications:              Iron deficiency anemia, Melena two weeks ago. Providers:                Hildred Laser, MD, Janeece Riggers, RN, Georgeann Oppenheim,                            Technician Referring MD:             Rande Lawman- Osvaldo Human, MD Medicines:                Cetacaine spray, Meperidine 50 mg IV, Midazolam 4                            mg IV Complications:            No immediate complications. Estimated Blood Loss:     Estimated blood loss: none. Procedure:                Pre-Anesthesia Assessment:                           - Prior to the procedure, a History and Physical                            was performed, and patient medications and                            allergies were reviewed. The patient's tolerance of                            previous anesthesia was also reviewed. The risks                            and benefits of the procedure and the sedation                            options and risks were discussed with the patient.                            All questions were answered, and informed consent                            was obtained. Prior Anticoagulants: The patient                            last took Eliquis (apixaban) 2 days prior to the                            procedure. ASA Grade Assessment: III - A patient  with severe systemic disease. After reviewing the                            risks and benefits, the patient was deemed in                            satisfactory condition to undergo the procedure.                           After obtaining informed consent, the endoscope was                            passed under direct vision. Throughout the   procedure, the patient's blood pressure, pulse, and                            oxygen saturations were monitored continuously. The                            [id number] ([id number]) scope was introduced through the                            mouth, and advanced to the second part of duodenum.                            The upper GI endoscopy was accomplished without                            difficulty. The patient tolerated the procedure                            well. Scope In: 11:40:01 AM Scope Out: 11:45:58 AM Total Procedure Duration: 0 hours 5 minutes 57 seconds  Findings:      The upper third of the esophagus and middle third of the esophagus were       normal.      Three tongues of salmon-colored mucosa were present at 43 cm and three       tongues of salmon-colored mucosa were present from 44 to 45 cm. The       maximum longitudinal extent of these esophageal mucosal changes was 2 cm       in length. Biopsy is contraindicated because the patient is taking       anticoagulation medication.      Four non-bleeding cratered gastric ulcers with no stigmata of bleeding       were found in the gastric antrum. The largest lesion was 8 mm in largest       dimension.      The exam of the stomach was otherwise normal.      The duodenal bulb and second portion of the duodenum were normal. Impression:               - Normal upper third of esophagus and middle third                            of esophagus.                           -  Salmon-colored mucosa consistent with                            short-segment Barrett's esophagus. Biopsy is                            contraindicated.                           - Non-bleeding gastric ulcers with no stigmata of                            bleeding.                           - Normal duodenal bulb and second portion of the                            duodenum.                           - No specimens collected. Moderate Sedation:      Moderate  (conscious) sedation was administered by the endoscopy nurse       and supervised by the endoscopist. The following parameters were       monitored: oxygen saturation, heart rate, blood pressure, CO2       capnography and response to care. Total physician intraservice time was       10 minutes. Recommendation:           - Return patient to ICU for ongoing care.                           - Low sodium diet today.                           - Continue present medications.                           - Perform an H. pylori serology today.                           - Repeat upper endoscopy in 8 weeks to check                            healing.                           - colonoscopy at the time EGD in 8 weeks. Procedure Code(s):        --- Professional ---                           [phone], Esophagogastroduodenoscopy, flexible,                            transoral; diagnostic, including collection of  specimen(s) by brushing or washing, when performed                            (separate procedure)                           99152, Moderate sedation services provided by the                            same physician or other qualified health care                            professional performing the diagnostic or                            therapeutic service that the sedation supports,                            requiring the presence of an independent trained                            observer to assist in the monitoring of the                            patient's level of consciousness and physiological                            status; initial 15 minutes of intraservice time,                            patient age 29 years or older Diagnosis Code(s):        --- Professional ---                           K22.8, Other specified diseases of esophagus                           K25.9, Gastric ulcer, unspecified as acute or                            chronic, without  hemorrhage or perforation                           D50.9, Iron deficiency anemia, unspecified                           K92.1, Melena (includes Hematochezia) CPT copyright 2016 [employer]. All rights reserved. The codes documented in this report are preliminary and upon coder review may  be revised to meet current compliance requirements. Hildred Laser, MD Hildred Laser, MD 05/27/2016 11:58:05 AM This report has been signed electronically. Number of Addenda: 0

## 2016-05-27 NOTE — Progress Notes (Signed)
PROGRESS NOTE    Shaun May  ZOX:[id number] DOB: 12-10-1950 DOA: 05/25/2016 PCP: Wende Neighbors, MD     Brief Narrative:  65 y/o man admitted on 6/20 from his PCPs office with a Hb of 4.2. Has PAF and has been on Xarelto. Has received a total of 5 units of PRBCs  with resultant hemoglobin of around 8.4.  Assessment & Plan:   Principal Problem:   Anemia Active Problems:   Symptomatic anemia   Hypokalemia   Hyponatremia   Anemia -Unfortunately anemia panel not drawn prior to transfusion. -With Hb of 9.9 on 04/13/16, most likely explanation for such a brisk decrease in Hb would be loss of blood, likely from the GI tract. -Patient denies frequent use of NSAIDs, no reflux symptoms. -He does state that about 2 weeks ago noticed a big dark stool but hasn't really been paying attention to the color of his stools since. -Has received a total of 5 units of PRBCs so far this admission. -Ferritin is 10 which is indicative of iron deficiency. -For upper endoscopy today, appreciate GI input and recommendations.  PAF -Currently paced. -Rate controlled. -Xarelto on hold.  Hypokalemia - replaced, recheck levels in a.m.  ARF -Resolved with IVF and PRBCs, likely pre-renal azotemia.   DVT prophylaxis: SCDs Code Status: Full code Family Communication: significant other at bedside updated on plan of care Disposition Plan:  for endoscopy today, transfer to [facility].  Consultants:   GI  Procedures:   None  Antimicrobials:   None    Subjective:  groggy  Objective: Filed Vitals:   05/27/16 0500 05/27/16 0600 05/27/16 0700 05/27/16 0800  BP: 137/76 126/71 131/81 139/73  Pulse: 57 50 58 58  Temp:      TempSrc:      Resp: '15 26 18 20  '$ Height:      Weight: 81.9 kg (180 lb 8.9 oz)     SpO2: 100% 90% 100% 100%    Intake/Output Summary (Last 24 hours) at 05/27/16 1040 Last data filed at 05/27/16 0800  Gross per 24 hour  Intake 2671.67 ml  Output   1300 ml  Net 1371.67  ml   Filed Weights   05/25/16 2132 05/26/16 0118 05/27/16 0500  Weight: 75.297 kg (166 lb) 76.2 kg (167 lb 15.9 oz) 81.9 kg (180 lb 8.9 oz)    Examination:  General exam: Alert, awake, oriented x 3, pale Respiratory system: Clear to auscultation. Respiratory effort normal. Cardiovascular system:RRR. No murmurs, rubs, gallops. Gastrointestinal system: Abdomen is nondistended, soft and nontender. No organomegaly or masses felt. Normal bowel sounds heard. Central nervous system: Alert and oriented. No focal neurological deficits. Extremities: No C/C/E, +pedal pulses Skin: No rashes, lesions or ulcers Psychiatry: Judgement and insight appear normal. Mood & affect appropriate.     Data Reviewed: I have personally reviewed following labs and imaging studies  CBC:  Recent Labs Lab 05/25/16 2210 05/26/16 0803 05/26/16 1848 05/27/16 0356  WBC 6.8 6.5  --   --   NEUTROABS 5.2  --   --   --   HGB 4.2* 5.7* 8.3* 8.4*  HCT 14.3* 18.8* 25.7* 26.9*  MCV 75.3* 79.0  --   --   PLT 244 228  --   --    Basic Metabolic Panel:  Recent Labs Lab 05/25/16 2210 05/26/16 0803  NA 127* 129*  K 2.9* 3.4*  CL 88* 94*  CO2 30 30  GLUCOSE 120* 100*  BUN 43* 35*  CREATININE 1.39*  1.01  CALCIUM 7.9* 7.6*  MG  --  1.7   GFR: Estimated Creatinine Clearance: 76.3 mL/min (by C-G formula based on Cr of 1.01). Liver Function Tests:  Recent Labs Lab 05/25/16 2210 05/26/16 0803  AST 29 25  ALT 20 15*  ALKPHOS 68 57  BILITOT 1.5* 2.6*  PROT 5.9* 5.3*  ALBUMIN 3.2* 2.9*   No results for input(s): LIPASE, AMYLASE in the last 168 hours. No results for input(s): AMMONIA in the last 168 hours. Coagulation Profile:  Recent Labs Lab 05/25/16 2210 05/27/16 0356  INR 8.02* 1.85*   Cardiac Enzymes:  Recent Labs Lab 05/25/16 2210  TROPONINI 0.03   BNP (last 3 results) No results for input(s): PROBNP in the last 8760 hours. HbA1C: No results for input(s): HGBA1C in the last 72  hours. CBG: No results for input(s): GLUCAP in the last 168 hours. Lipid Profile: No results for input(s): CHOL, HDL, LDLCALC, TRIG, CHOLHDL, LDLDIRECT in the last 72 hours. Thyroid Function Tests:  Recent Labs  05/25/16 2210  TSH 0.755   Anemia Panel:  Recent Labs  05/26/16 0803 05/26/16 0814  VITAMINB12 756  --   FERRITIN 24  --   TIBC  --  435  IRON  --  44*   Urine analysis:    Component Value Date/Time   COLORURINE YELLOW 05/25/2016 2318   APPEARANCEUR CLEAR 05/25/2016 2318   LABSPEC <1.005* 05/25/2016 2318   PHURINE 5.5 05/25/2016 2318   GLUCOSEU NEGATIVE 05/25/2016 2318   HGBUR NEGATIVE 05/25/2016 2318   BILIRUBINUR NEGATIVE 05/25/2016 2318   KETONESUR NEGATIVE 05/25/2016 2318   PROTEINUR NEGATIVE 05/25/2016 2318   NITRITE NEGATIVE 05/25/2016 2318   LEUKOCYTESUR NEGATIVE 05/25/2016 2318   Sepsis Labs: '@LABRCNTIP'$ (procalcitonin:4,lacticidven:4)  ) Recent Results (from the past 240 hour(s))  MRSA PCR Screening     Status: None   Collection Time: 05/26/16 12:53 AM  Result Value Ref Range Status   MRSA by PCR NEGATIVE NEGATIVE Final    Comment:        The GeneXpert MRSA Assay (FDA approved for NASAL specimens only), is one component of a comprehensive MRSA colonization surveillance program. It is not intended to diagnose MRSA infection nor to guide or monitor treatment for MRSA infections.          Radiology Studies: Dg Chest Portable 1 View  05/25/2016  CLINICAL DATA:  Acute onset of dizziness and lightheadedness. Generalized weakness and fatigue. Bilateral leg swelling. Shortness of breath. Initial encounter. EXAM: PORTABLE CHEST 1 VIEW COMPARISON:  Chest radiograph and CTA of the chest performed 03/29/2016 FINDINGS: The lungs are well-aerated. Vascular congestion is noted. There is no evidence of focal opacification, pleural effusion or pneumothorax. The cardiomediastinal silhouette is within normal limits. A pacemaker is noted overlying the left  chest wall, with leads ending overlying the right atrium and right ventricle. No acute osseous abnormalities are seen. IMPRESSION: Vascular congestion noted.  Lungs remain grossly clear. Electronically Signed   By: Garald Balding M.D.   On: 05/25/2016 23:16        Scheduled Meds: . atorvastatin  40 mg Oral q1800  . nicotine  21 mg Transdermal Daily  . [START ON 05/29/2016] pantoprazole  40 mg Intravenous Q12H  . potassium chloride SA  20 mEq Oral Daily  . rOPINIRole  0.25-0.75 mg Oral QHS  . sodium chloride flush  3 mL Intravenous Q12H   Continuous Infusions: . pantoprozole (PROTONIX) infusion 8 mg/hr (05/27/16 0933)     LOS: 2 days  Time spent: 25 minutes. Greater than 50% of this time was spent in direct contact with the patient coordinating care.     Lelon Frohlich, MD [facility] Pager [phone]  If 7PM-7AM, please contact night-coverage [email or web address] Password [email or web address] 05/27/2016, 10:40 AM

## 2016-05-27 NOTE — Care Management Note (Signed)
Case Management Note  Patient Details  Name: Shaun May MRN: [id number] Date of Birth: 04/25/1951  Subjective/Objective:                  Pt admitted with anemia. Pt is from home, lives with spouse and is ind with ADL's. Pt has PCP and transportation to appointments. Anticipate pt will return home with self care.   Action/Plan: No CM needs anticipated.   Expected Discharge Date:  05/28/16               Expected Discharge Plan:  Home/Self Care  In-House Referral:  NA  Discharge planning Services  CM Consult  Post Acute Care Choice:  NA Choice offered to:  NA  DME Arranged:    DME Agency:     HH Arranged:    HH Agency:     Status of Service:  Completed, signed off  If discussed at [employer] of Stay Meetings, dates discussed:    Additional Comments:  Sherald Barge, RN 05/27/2016, 11:55 AM

## 2016-05-27 NOTE — Progress Notes (Signed)
Patient ID: Shaun May, male   DOB: 09/17/1951, 65 y.o.   MRN: [id number] Voices no complaints. Had small BM during the night. Blood pressure 139/73, pulse 58, temperature 97.7 F (36.5 C), temperature source Oral, resp. rate 20, height '5\' 10"'$  (1.778 m), weight 180 lb 8.9 oz (81.9 kg), SpO2 100 %. CBC    Component Value Date/Time   WBC 6.5 05/26/2016 0803   RBC 2.38* 05/26/2016 0803   HGB 8.4* 05/27/2016 0356   HCT 26.9* 05/27/2016 0356   PLT 228 05/26/2016 0803   MCV 79.0 05/26/2016 0803   MCH 23.9* 05/26/2016 0803   MCHC 30.3 05/26/2016 0803   RDW 17.9* 05/26/2016 0803   LYMPHSABS 0.9 05/25/2016 2210   MONOABS 0.6 05/25/2016 2210   EOSABS 0.1 05/25/2016 2210   BASOSABS 0.0 05/25/2016 2210   Assessment: GI bleed. EGD today to rule PUD.

## 2016-05-27 NOTE — Plan of Care (Signed)
Problem: Safety: Goal: Ability to remain free from injury will improve Outcome: Not Progressing Pt is frequently climbing oob despite instruction to call for assist, siderails up significant other w/ him and bed alarm on.  Problem: Health Behavior/Discharge Planning: Goal: Ability to manage health-related needs will improve Outcome: Not Progressing Pt is very forgetful at times  Problem: Pain Managment: Goal: General experience of comfort will improve Outcome: Progressing Pt states he feels much better than he did on admission  Problem: Physical Regulation: Goal: Ability to maintain clinical measurements within normal limits will improve Outcome: Progressing hgb up to 8.3

## 2016-05-28 DIAGNOSIS — K228 Other specified diseases of esophagus: Secondary | ICD-10-CM

## 2016-05-28 DIAGNOSIS — K259 Gastric ulcer, unspecified as acute or chronic, without hemorrhage or perforation: Secondary | ICD-10-CM

## 2016-05-28 DIAGNOSIS — D500 Iron deficiency anemia secondary to blood loss (chronic): Secondary | ICD-10-CM

## 2016-05-28 LAB — CBC
HCT: 27.8 % — ABNORMAL LOW (ref 39.0–52.0)
HEMOGLOBIN: 8.7 g/dL — AB (ref 13.0–17.0)
MCH: 25.6 pg — AB (ref 26.0–34.0)
MCHC: 31.3 g/dL (ref 30.0–36.0)
MCV: 81.8 fL (ref 78.0–100.0)
PLATELETS: 209 10*3/uL (ref 150–400)
RBC: 3.4 MIL/uL — AB (ref 4.22–5.81)
RDW: 16.9 % — ABNORMAL HIGH (ref 11.5–15.5)
WBC: 8.2 10*3/uL (ref 4.0–10.5)

## 2016-05-28 LAB — BASIC METABOLIC PANEL
ANION GAP: 5 (ref 5–15)
BUN: 10 mg/dL (ref 6–20)
CALCIUM: 7.9 mg/dL — AB (ref 8.9–10.3)
CO2: 28 mmol/L (ref 22–32)
CREATININE: 0.68 mg/dL (ref 0.61–1.24)
Chloride: 99 mmol/L — ABNORMAL LOW (ref 101–111)
Glucose, Bld: 87 mg/dL (ref 65–99)
Potassium: 3.4 mmol/L — ABNORMAL LOW (ref 3.5–5.1)
SODIUM: 132 mmol/L — AB (ref 135–145)

## 2016-05-28 LAB — H. PYLORI ANTIBODY, IGG: H Pylori IgG: 0.9 U/mL — ABNORMAL HIGH (ref 0.0–0.8)

## 2016-05-28 MED ORDER — PANTOPRAZOLE SODIUM 40 MG PO TBEC: 40.0000 mg | DELAYED_RELEASE_TABLET | Freq: Two times a day (BID) | ORAL | Status: DC

## 2016-05-28 NOTE — Discharge Instructions (Signed)
Resume use of Xarelto after 2 weeks on 06/11/2016.

## 2016-05-28 NOTE — Progress Notes (Signed)
Discharge instructions and prescriptions given, verbalized understanding, out in stable condition via w/c with staff. 

## 2016-05-28 NOTE — Progress Notes (Signed)
Patient ID: Shaun May, male   DOB: 20-Jul-1951, 65 y.o.   MRN: [id number] Sitting at side of bed. States he is worried about his weakness. Is able to ambulate to BR. Had one black stool during the night. 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 He underwent an EGD yesterday which revealed:Impression:- Normal upper third of esophagus and middle third   of esophagus.  - Salmon-colored mucosa consistent with   short-segment Barrett's esophagus. Biopsy is   contraindicated.  - Non-bleeding gastric ulcers with no stigmata of   bleeding.  - Normal duodenal bulb and second portion of the   duodenum.  - No specimens collected. CBC    Component Value Date/Time   WBC 8.2 05/28/2016 0455   RBC 3.40* 05/28/2016 0455   HGB 8.7* 05/28/2016 0455   HCT 27.8* 05/28/2016 0455   HCT 18.5* 05/26/2016 0803   PLT 209 05/28/2016 0455   MCV 81.8 05/28/2016 0455   MCH 25.6* 05/28/2016 0455   MCHC 31.3 05/28/2016 0455   RDW 16.9* 05/28/2016 0455   LYMPHSABS 0.9 05/25/2016 2210   MONOABS 0.6 05/25/2016 2210   EOSABS 0.1 05/25/2016 2210   BASOSABS 0.0 05/25/2016 2210    CBC Latest Ref Rng 05/28/2016 05/27/2016 05/26/2016  WBC 4.0 - 10.5 K/uL 8.2 - -  Hemoglobin 13.0 - 17.0 g/dL 8.7(L) 8.4(L) 8.3(L)  Hematocrit 39.0 - 52.0 % 27.8(L) 26.9(L) 25.7(L)  Platelets 150 - 400 K/uL 209 - -    Blood pressure 137/61, pulse 62, temperature 98.1 F (36.7 C), temperature source Oral, resp. rate 18, height '5\' 10"'$  (1.778 m), weight 180 lb 8.9 oz (81.9 kg), SpO2 96 %. Repeat colonoscopy in 8 week to document healing and also screening colonoscopy.

## 2016-05-28 NOTE — Discharge Summary (Signed)
Physician Discharge Summary  RAISTLIN GUM SNK:[id number] DOB: 05-28-1951 DOA: 05/25/2016  PCP: Wende Neighbors, MD  Admit date: 05/25/2016 Discharge date: 05/28/2016  Time spent: 45 minutes  Recommendations for Outpatient Follow-up:  -Will be discharged home today. -Advised to follow-up with primary care provider in 2 weeks and with Dr. Laural Golden in 8 weeks for repeat endoscopy and colonoscopy.   Discharge Diagnoses:  Principal Problem:   Anemia Active Problems:   Symptomatic anemia   Hypokalemia   Hyponatremia   Discharge Condition: Stable and improved  Filed Weights   05/25/16 2132 05/26/16 0118 05/27/16 0500  Weight: 75.297 kg (166 lb) 76.2 kg (167 lb 15.9 oz) 81.9 kg (180 lb 8.9 oz)    History of present illness:  As per Dr. Maudie Mercury on 6/20: Lean Fayson is a 65 y.o. male, w hx of hemochromatosis, Aflutter, 2nd degree AV block, ETOH abuse, Pulmonary nodule, (hypermetabolic on PET), apparently on xarelto, apparently felt sob, and lightheaded. Pt states that he has been having symptoms for about 1-2 weeks. Pt denies abd pain, heartburn, n/v, diarrhea, brbpr. Might have had some black stool. Pt was sent over by Delphina Cahill after labs showed severe anemia.   In ED, Hgb 4.2, No stool in vault. Pt was also noted to have hyponatremia, hypokalemia, and mild renal insufficiency. Pt will be admitted for severe anemia.   Hospital Course:   Anemia -Unfortunately anemia panel not drawn prior to transfusion. Hemoglobin was 4.2 on admission. -With Hb of 9.9 on 04/13/16, most likely explanation for such a brisk decrease in Hb would be loss of blood, likely from the GI tract. -Patient denies frequent use of NSAIDs, no reflux symptoms. -He does state that about 2 weeks ago noticed a big dark stool but hasn't really been paying attention to the color of his stools since. -Has received a total of 5 units of PRBCs so far this admission. -Ferritin is 10 which is indicative of iron  deficiency. -EGD with findings of gastric antral ulcers. -To continue Protonix twice daily as an outpatient.  PAF -Currently paced. -Rate controlled. -Xarelto on hold for the next 2 weeks and then may resume, is instructed to hold aspirin indefinitely.  Hypokalemia -Replaced.  ARF -Resolved with IVF and PRBCs, likely pre-renal azotemia.  Procedures: Upper endoscopy: :- Normal upper third of esophagus and middle third   of esophagus.  - Salmon-colored mucosa consistent with   short-segment Barrett's esophagus. Biopsy is   contraindicated.  - Non-bleeding gastric ulcers with no stigmata of   bleeding.  - Normal duodenal bulb and second portion of the   duodenum.   - No specimens collected.     Consultations:  GI  Discharge Instructions  Discharge Instructions    Diet - low sodium heart healthy    Complete by:  As directed      Increase activity slowly    Complete by:  As directed             Medication List    STOP taking these medications        aspirin 81 MG EC tablet     predniSONE 10 MG tablet  Commonly known as:  DELTASONE     rivaroxaban 20 MG Tabs tablet  Commonly known as:  XARELTO      TAKE these medications        atorvastatin 40 MG tablet  Commonly known as:  LIPITOR  Take 1 tablet (40 mg total) by mouth daily at 6 PM.  carvedilol 25 MG tablet  Commonly known as:  COREG  Take 12.5 mg by mouth 2 (two) times daily with a meal.     furosemide 40 MG tablet  Commonly known as:  LASIX  Take 40 mg by mouth daily.     guaiFENesin 600 MG 12 hr tablet  Commonly known as:  MUCINEX  Take 1 tablet (600 mg total) by mouth 2 (two) times daily.     LORazepam 2 MG tablet  Commonly known as:  ATIVAN  Take 1 tablet by  mouth 4 (four) times daily.     nicotine 21 mg/24hr patch  Commonly known as:  NICODERM CQ - dosed in mg/24 hours  Place 1 patch (21 mg total) onto the skin daily.     oxyCODONE-acetaminophen 5-325 MG tablet  Commonly known as:  PERCOCET/ROXICET  Take 1-2 tablets by mouth every 6 (six) hours as needed.     pantoprazole 40 MG tablet  Commonly known as:  PROTONIX  Take 1 tablet (40 mg total) by mouth 2 (two) times daily before a meal.     potassium chloride SA 20 MEQ tablet  Commonly known as:  K-DUR,KLOR-CON  Take 1 tablet by mouth daily.     PROAIR HFA 108 (90 Base) MCG/ACT inhaler  Generic drug:  albuterol  Inhale 2 puffs into the lungs every 4 (four) hours as needed for wheezing or shortness of breath.     albuterol (2.5 MG/3ML) 0.083% nebulizer solution  Commonly known as:  PROVENTIL  Take 3 mLs (2.5 mg total) by nebulization every 4 (four) hours as needed for wheezing or shortness of breath.     rOPINIRole 0.25 MG tablet  Commonly known as:  REQUIP  Take 1-3 tablets by mouth at bedtime.       No Known Allergies     Follow-up Information    Follow up with Hildred Laser, MD. Schedule an appointment as soon as possible for a visit in 6 weeks.   Specialty:  Gastroenterology   Contact information:   [place], [address] [place] [address] [phone]        The results of significant diagnostics from this hospitalization (including imaging, microbiology, ancillary and laboratory) are listed below for reference.    Significant Diagnostic Studies: Dg Chest Portable 1 View  05/25/2016  CLINICAL DATA:  Acute onset of dizziness and lightheadedness. Generalized weakness and fatigue. Bilateral leg swelling. Shortness of breath. Initial encounter. EXAM: PORTABLE CHEST 1 VIEW COMPARISON:  Chest radiograph and CTA of the chest performed 03/29/2016 FINDINGS: The lungs are well-aerated. Vascular congestion is noted. There is no evidence of focal opacification, pleural  effusion or pneumothorax. The cardiomediastinal silhouette is within normal limits. A pacemaker is noted overlying the left chest wall, with leads ending overlying the right atrium and right ventricle. No acute osseous abnormalities are seen. IMPRESSION: Vascular congestion noted.  Lungs remain grossly clear. Electronically Signed   By: Garald Balding M.D.   On: 05/25/2016 23:16    Microbiology: Recent Results (from the past 240 hour(s))  MRSA PCR Screening     Status: None   Collection Time: 05/26/16 12:53 AM  Result Value Ref Range Status   MRSA by PCR NEGATIVE NEGATIVE Final    Comment:        The GeneXpert MRSA Assay (FDA approved for NASAL specimens only), is one component of a comprehensive MRSA colonization surveillance program. It is not intended to diagnose MRSA infection nor to guide or monitor treatment  for MRSA infections.      Labs: Basic Metabolic Panel:  Recent Labs Lab 05/25/16 2210 05/26/16 0803 05/28/16 0455  NA 127* 129* 132*  K 2.9* 3.4* 3.4*  CL 88* 94* 99*  CO2 '30 30 28  '$ GLUCOSE 120* 100* 87  BUN 43* 35* 10  CREATININE 1.39* 1.01 0.68  CALCIUM 7.9* 7.6* 7.9*  MG  --  1.7  --    Liver Function Tests:  Recent Labs Lab 05/25/16 2210 05/26/16 0803  AST 29 25  ALT 20 15*  ALKPHOS 68 57  BILITOT 1.5* 2.6*  PROT 5.9* 5.3*  ALBUMIN 3.2* 2.9*   No results for input(s): LIPASE, AMYLASE in the last 168 hours. No results for input(s): AMMONIA in the last 168 hours. CBC:  Recent Labs Lab 05/25/16 2210 05/26/16 0803 05/26/16 1848 05/27/16 0356 05/28/16 0455  WBC 6.8 6.5  --   --  8.2  NEUTROABS 5.2  --   --   --   --   HGB 4.2* 5.7* 8.3* 8.4* 8.7*  HCT 14.3* 18.8*  18.5* 25.7* 26.9* 27.8*  MCV 75.3* 79.0  --   --  81.8  PLT 244 228  --   --  209   Cardiac Enzymes:  Recent Labs Lab 05/25/16 2210  TROPONINI 0.03   BNP: BNP (last 3 results)  Recent Labs  10/25/15 1420 03/27/16 0145 03/29/16 0807  BNP 95.0 138.0* 488.0*     ProBNP (last 3 results) No results for input(s): PROBNP in the last 8760 hours.  CBG: No results for input(s): GLUCAP in the last 168 hours.     SignedLelon Frohlich  [facility] Pager: [phone] 05/28/2016, 5:34 PM

## 2016-05-29 LAB — TYPE AND SCREEN
ABO/RH(D): O POS
Antibody Screen: NEGATIVE
UNIT DIVISION: 0
UNIT DIVISION: 0
UNIT DIVISION: 0
UNIT DIVISION: 0
Unit division: 0
Unit division: 0

## 2016-05-31 ENCOUNTER — Encounter (INDEPENDENT_AMBULATORY_CARE_PROVIDER_SITE_OTHER): Payer: Self-pay | Admitting: Internal Medicine

## 2016-05-31 ENCOUNTER — Encounter (HOSPITAL_COMMUNITY): Payer: Self-pay | Admitting: Internal Medicine

## 2016-05-31 ENCOUNTER — Ambulatory Visit (INDEPENDENT_AMBULATORY_CARE_PROVIDER_SITE_OTHER): Payer: BLUE CROSS/BLUE SHIELD | Admitting: *Deleted

## 2016-05-31 ENCOUNTER — Telehealth: Payer: Self-pay | Admitting: Internal Medicine

## 2016-05-31 DIAGNOSIS — I442 Atrioventricular block, complete: Secondary | ICD-10-CM | POA: Diagnosis not present

## 2016-05-31 NOTE — Telephone Encounter (Signed)
Informed patient that remote was received. Pt voiced understanding.

## 2016-05-31 NOTE — Telephone Encounter (Signed)
Verbal instructions given on how to send remote transmission. Patient voiced understanding.   Plan to call patient in 15 minutes to let him know if transmission was received.

## 2016-05-31 NOTE — Telephone Encounter (Signed)
New Message  Pt calling to speak w/ RN- needs assistance to send remote transmission. Please call back and discuss.

## 2016-06-01 LAB — CUP PACEART REMOTE DEVICE CHECK
Battery Remaining Longevity: 136 mo
Battery Voltage: 2.79 V
Date Time Interrogation Session: 20170626171802
Implantable Lead Implant Date: 20161122
Implantable Lead Location: 753860
Implantable Lead Model: 5076
Implantable Lead Model: 5076
Lead Channel Pacing Threshold Amplitude: 0.5 V
Lead Channel Pacing Threshold Amplitude: 0.625 V
Lead Channel Pacing Threshold Pulse Width: 0.4 ms
Lead Channel Setting Pacing Amplitude: 2.5 V
Lead Channel Setting Pacing Pulse Width: 0.4 ms
Lead Channel Setting Sensing Sensitivity: 4 mV
MDC IDC LEAD IMPLANT DT: 20161122
MDC IDC LEAD LOCATION: 753859
MDC IDC MSMT BATTERY IMPEDANCE: 100 Ohm
MDC IDC MSMT LEADCHNL RA IMPEDANCE VALUE: 510 Ohm
MDC IDC MSMT LEADCHNL RA PACING THRESHOLD PULSEWIDTH: 0.4 ms
MDC IDC MSMT LEADCHNL RA SENSING INTR AMPL: 2.8 mV
MDC IDC MSMT LEADCHNL RV IMPEDANCE VALUE: 461 Ohm
MDC IDC SET LEADCHNL RA PACING AMPLITUDE: 2 V
MDC IDC STAT BRADY AP VP PERCENT: 15 %
MDC IDC STAT BRADY AP VS PERCENT: 5 %
MDC IDC STAT BRADY AS VP PERCENT: 69 %
MDC IDC STAT BRADY AS VS PERCENT: 12 %

## 2016-06-01 NOTE — Progress Notes (Signed)
Remote pacemaker transmission.   

## 2016-06-02 ENCOUNTER — Telehealth (INDEPENDENT_AMBULATORY_CARE_PROVIDER_SITE_OTHER): Payer: Self-pay | Admitting: *Deleted

## 2016-06-02 ENCOUNTER — Encounter: Payer: Self-pay | Admitting: Cardiology

## 2016-06-02 DIAGNOSIS — D649 Anemia, unspecified: Secondary | ICD-10-CM

## 2016-06-02 NOTE — Telephone Encounter (Signed)
Per Dr.Rehman the patient will need to have labs drawn prior to OV with Terri.

## 2016-06-14 ENCOUNTER — Ambulatory Visit (HOSPITAL_COMMUNITY)
Admission: RE | Admit: 2016-06-14 | Discharge: 2016-06-14 | Disposition: A | Discharge status: Home / Self Care | Payer: BLUE CROSS/BLUE SHIELD | Source: Ambulatory Visit | Attending: Cardiovascular Disease | Admitting: Cardiovascular Disease

## 2016-06-14 ENCOUNTER — Other Ambulatory Visit: Payer: Self-pay | Admitting: Cardiovascular Disease

## 2016-06-14 DIAGNOSIS — I313 Pericardial effusion (noninflammatory): Secondary | ICD-10-CM | POA: Insufficient documentation

## 2016-06-14 DIAGNOSIS — J449 Chronic obstructive pulmonary disease, unspecified: Secondary | ICD-10-CM | POA: Insufficient documentation

## 2016-06-14 DIAGNOSIS — I3139 Other pericardial effusion (noninflammatory): Secondary | ICD-10-CM

## 2016-06-14 DIAGNOSIS — I119 Hypertensive heart disease without heart failure: Secondary | ICD-10-CM | POA: Insufficient documentation

## 2016-06-14 DIAGNOSIS — Z720 Tobacco use: Secondary | ICD-10-CM | POA: Diagnosis not present

## 2016-06-14 DIAGNOSIS — I319 Disease of pericardium, unspecified: Secondary | ICD-10-CM | POA: Insufficient documentation

## 2016-06-14 LAB — ECHOCARDIOGRAM LIMITED: RV TAPSE: 19.8 mm

## 2016-06-14 NOTE — Progress Notes (Signed)
*  PRELIMINARY RESULTS* Echocardiogram Limited 2-D Echocardiogram has been performed.  Shaun May 06/14/2016, 1:18 PM

## 2016-06-16 ENCOUNTER — Encounter: Payer: Self-pay | Admitting: Cardiology

## 2016-06-16 ENCOUNTER — Telehealth: Payer: Self-pay | Admitting: *Deleted

## 2016-06-16 NOTE — Telephone Encounter (Signed)
Patient would like to know when he can come off of Xarelto. Patient states that medication is making him bruise. Pt reports that today he had a coughing spell in which he coughed up blood. Pt states that it was bright red in color. When asked if his teeth bleed while brushing pt states that they do at times. When asked if they bleed while he was off Xarelto, pt stated "Yes". Pt states that his throat has been hurting and he has been eating ice for this. Pt reports that after about 5-6 mins of coughing the bleeding stopped. Patient reports that this coughing up blood has only happened today. Pt. Denies blood in urine and stool at this time.

## 2016-06-17 NOTE — Telephone Encounter (Signed)
Patient notified to stop Xarelto again,told other nurse L Pinnix, he went back on it 2 weeks ago BY GI .watch for signs of bleeding,f/u apt with Dr Bronson Ing for 07/01/16 at 4 pm   Of note,pt has not seen dentist in 10 yrs he reports

## 2016-06-17 NOTE — Telephone Encounter (Signed)
Have him stop his xarelto for now, we will need to monitor his blood counts and any recurrence of bleeding before restarting given his recent admission with severe anemia. He needs to f/u with Dr Raliegh Ip in 2 weeks to readdress anticoagulation.   Zandra Abts MD

## 2016-06-23 ENCOUNTER — Ambulatory Visit (INDEPENDENT_AMBULATORY_CARE_PROVIDER_SITE_OTHER): Payer: BLUE CROSS/BLUE SHIELD | Admitting: Internal Medicine

## 2016-06-23 ENCOUNTER — Telehealth (INDEPENDENT_AMBULATORY_CARE_PROVIDER_SITE_OTHER): Payer: Self-pay | Admitting: *Deleted

## 2016-06-23 ENCOUNTER — Other Ambulatory Visit (INDEPENDENT_AMBULATORY_CARE_PROVIDER_SITE_OTHER): Payer: Self-pay | Admitting: *Deleted

## 2016-06-23 ENCOUNTER — Other Ambulatory Visit (INDEPENDENT_AMBULATORY_CARE_PROVIDER_SITE_OTHER): Payer: Self-pay | Admitting: Internal Medicine

## 2016-06-23 ENCOUNTER — Encounter (INDEPENDENT_AMBULATORY_CARE_PROVIDER_SITE_OTHER): Payer: Self-pay | Admitting: *Deleted

## 2016-06-23 ENCOUNTER — Encounter (INDEPENDENT_AMBULATORY_CARE_PROVIDER_SITE_OTHER): Payer: Self-pay | Admitting: Internal Medicine

## 2016-06-23 VITALS — BP 150/68 | HR 60 | Temp 98.0°F | Ht 70.0 in | Wt 168.5 lb

## 2016-06-23 DIAGNOSIS — Z1211 Encounter for screening for malignant neoplasm of colon: Secondary | ICD-10-CM

## 2016-06-23 DIAGNOSIS — K921 Melena: Secondary | ICD-10-CM | POA: Diagnosis not present

## 2016-06-23 DIAGNOSIS — K279 Peptic ulcer, site unspecified, unspecified as acute or chronic, without hemorrhage or perforation: Secondary | ICD-10-CM

## 2016-06-23 LAB — CBC WITH DIFFERENTIAL/PLATELET
BASOS PCT: 0 %
Basophils Absolute: 0 cells/uL (ref 0–200)
EOS ABS: 41 {cells}/uL (ref 15–500)
Eosinophils Relative: 1 %
HEMATOCRIT: 29.1 % — AB (ref 38.5–50.0)
Hemoglobin: 8.9 g/dL — ABNORMAL LOW (ref 13.2–17.1)
LYMPHS ABS: 984 {cells}/uL (ref 850–3900)
LYMPHS PCT: 24 %
MCH: 23.5 pg — ABNORMAL LOW (ref 27.0–33.0)
MCHC: 30.6 g/dL — ABNORMAL LOW (ref 32.0–36.0)
MCV: 77 fL — AB (ref 80.0–100.0)
MONO ABS: 451 {cells}/uL (ref 200–950)
MPV: 8.6 fL (ref 7.5–12.5)
Monocytes Relative: 11 %
NEUTROS ABS: 2624 {cells}/uL (ref 1500–7800)
Neutrophils Relative %: 64 %
Platelets: 377 10*3/uL (ref 140–400)
RBC: 3.78 MIL/uL — AB (ref 4.20–5.80)
RDW: 18.7 % — AB (ref 11.0–15.0)
WBC: 4.1 10*3/uL (ref 3.8–10.8)

## 2016-06-23 MED ORDER — PEG 3350-KCL-NA BICARB-NACL 420 G PO SOLR: 4000.0000 mL | Freq: Once | ORAL | Status: DC

## 2016-06-23 NOTE — Telephone Encounter (Signed)
Patient scheduled for colonoscopy/endoscopy 08/11/16 and needs to stop Xarelto 2 days prior -- please advise

## 2016-06-23 NOTE — Patient Instructions (Signed)
EGD/Colonoscopy. The risks and benefits such as perforation, bleeding, and infection were reviewed with the patient and is agreeable. 

## 2016-06-23 NOTE — Telephone Encounter (Signed)
Not sure patient is taking Xarelto now per office note 06/16/16.  If he is taking, it is OK to hold Xarelto 48 hours prior to procedure.

## 2016-06-23 NOTE — Progress Notes (Signed)
Subjective:    Patient ID: Shaun May, male    DOB: 1951-01-09, 65 y.o.   MRN: [id number]  HPI Here today for f/u after recent admission to [facility] for anemia. Admitted for extreme weakness. No rectal bleeding. Noted to have hemoglobin  Of 4.2. 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 Maintained on Xarelto.  He had been having occasional black stools. His fecal occult blood card was negative in TED. 03/29/2016 Hemoglobin normal.  Takes ASA daily.  Hx of etoh abuse and drinks 3-4 small cups of liquor a night. He says he is doing fair.  Not quite up to par. No SOB with walking. Still has some weakness. Feels about 70% better. Back to work. He has not had any further black stools. Stools are nice and brown now. Appetite is okay.  CBC 05/28/2016 Hemoglobin 8.7 Last colonoscopy greater than 10 yrs ago.   CBC    Component Value Date/Time   WBC 8.2 05/28/2016 0455   RBC 3.40* 05/28/2016 0455   HGB 8.7* 05/28/2016 0455   HCT 27.8* 05/28/2016 0455   HCT 18.5* 05/26/2016 0803   PLT 209 05/28/2016 0455   MCV 81.8 05/28/2016 0455   MCH 25.6* 05/28/2016 0455   MCHC 31.3 05/28/2016 0455   RDW 16.9* 05/28/2016 0455   LYMPHSABS 0.9 05/25/2016 2210   MONOABS 0.6 05/25/2016 2210   EOSABS 0.1 05/25/2016 2210   BASOSABS 0.0 05/25/2016 2210   CBC Latest Ref Rng 05/28/2016 05/27/2016 05/26/2016  WBC 4.0 - 10.5 K/uL 8.2 - -  Hemoglobin 13.0 - 17.0 g/dL 8.7(L) 8.4(L) 8.3(L)  Hematocrit 39.0 - 52.0 % 27.8(L) 26.9(L) 25.7(L)  Platelets 150 - 400 K/uL 209 - -       05/27/2016 EGD:  Impression: - Normal upper third of esophagus and middle third   of esophagus.  - Salmon-colored mucosa consistent with   short-segment Barrett's esophagus. Biopsy is   contraindicated.  - Non-bleeding gastric ulcers with no stigmata of    bleeding.  - Normal duodenal bulb and second portion of the   duodenum.      Review of Systems Past Medical History  Diagnosis Date  . Hypertension   . Hemochromatosis   . Mobitz type 2 second degree heart block     PPM MDT 10/28/15 Dr. Lovena Le  . Atrial flutter ([place])   . Pericardial effusion   . Lung nodule     Past Surgical History  Procedure Laterality Date  . Ep implantable device N/A 10/28/2015    Procedure: Pacemaker Implant;  Surgeon: Evans Lance, MD;  Location: [facility];  Service: Cardiovascular;  Laterality: N/A;  . Ep implantable device N/A 11/07/2015    Procedure: Pocket Revision;  Surgeon: Evans Lance, MD;  Location: [facility];  Service: Cardiovascular;  Laterality: N/A;  . Cardiac catheterization N/A 03/31/2016    Procedure: Left Heart Cath and Coronary Angiography;  Surgeon: Wellington Hampshire, MD;  Location: [facility];  Service: Cardiovascular;  Laterality: N/A;  . Tonsillectomy    . Colonoscopy    . Esophagogastroduodenoscopy N/A 05/27/2016    Procedure: ESOPHAGOGASTRODUODENOSCOPY (EGD);  Surgeon: Rogene Houston, MD;  [facility]: [facility];  Service: Endoscopy;  Laterality: N/A;    No Known Allergies  Current Outpatient Prescriptions on File Prior to Visit  Medication Sig Dispense Refill  . albuterol (PROVENTIL) (2.5 MG/3ML) 0.083% nebulizer solution Take 3 mLs (2.5 mg total) by nebulization every 4 (four) hours as  needed for wheezing or shortness of breath. 75 mL 12  . atorvastatin (LIPITOR) 40 MG tablet Take 1 tablet (40 mg total) by mouth daily at 6 PM. 30 tablet 6  . carvedilol (COREG) 25 MG tablet Take 12.5 mg by mouth 2 (two) times daily with a meal.   11  . furosemide (LASIX) 40 MG tablet Take 40 mg by mouth daily.     Marland Kitchen LORazepam (ATIVAN) 2 MG tablet Take 1 tablet by mouth 4 (four) times daily.     Marland Kitchen oxyCODONE-acetaminophen (PERCOCET/ROXICET) 5-325 MG tablet Take 1-2  tablets by mouth every 6 (six) hours as needed. (Patient taking differently: Take 1 tablet by mouth 4 (four) times daily. ) 15 tablet 0  . pantoprazole (PROTONIX) 40 MG tablet Take 1 tablet (40 mg total) by mouth 2 (two) times daily before a meal. 60 tablet 2  . potassium chloride SA (K-DUR,KLOR-CON) 20 MEQ tablet Take 1 tablet by mouth daily.    Marland Kitchen PROAIR HFA 108 (90 Base) MCG/ACT inhaler Inhale 2 puffs into the lungs every 4 (four) hours as needed for wheezing or shortness of breath. 1 Inhaler 0  . rOPINIRole (REQUIP) 0.25 MG tablet Take 1-3 tablets by mouth at bedtime.   1  . guaiFENesin (MUCINEX) 600 MG 12 hr tablet Take 1 tablet (600 mg total) by mouth 2 (two) times daily. (Patient not taking: Reported on 05/25/2016) 30 tablet 0  . nicotine (NICODERM CQ - DOSED IN MG/24 HOURS) 21 mg/24hr patch Place 1 patch (21 mg total) onto the skin daily. (Patient not taking: Reported on 05/13/2016) 28 patch 0   No current facility-administered medications on file prior to visit.        Objective:   Physical ExamBlood pressure 150/68, pulse 60, temperature 98 F (36.7 C), height '5\' 10"'$  (1.778 m), weight 168 lb 8 oz (76.431 kg). Alert and oriented. Skin warm and dry. Oral mucosa is moist.   . Sclera anicteric, conjunctivae is pink. Thyroid not enlarged. No cervical lymphadenopathy. Lungs clear. Heart regular rate and rhythm.  Abdomen is soft. Bowel sounds are positive. No hepatomegaly. No abdominal masses felt. No tenderness. 2+  edema to lower extremities.          Assessment & Plan:  Melena. Stools are brown now. Hemoglobin 05/2016 8.4. Will repeat CBC toda.   Repeat EGD from 6/22 to document healing ulcers.  Screening colonoscopy.

## 2016-06-23 NOTE — Telephone Encounter (Signed)
Patient needs trilyte 

## 2016-06-24 ENCOUNTER — Telehealth: Payer: Self-pay | Admitting: Cardiovascular Disease

## 2016-06-24 ENCOUNTER — Telehealth (INDEPENDENT_AMBULATORY_CARE_PROVIDER_SITE_OTHER): Payer: Self-pay | Admitting: Internal Medicine

## 2016-06-24 NOTE — Telephone Encounter (Signed)
error 

## 2016-06-24 NOTE — Telephone Encounter (Signed)
To dr Bronson Ing

## 2016-06-24 NOTE — Telephone Encounter (Signed)
Pt.notified

## 2016-06-24 NOTE — Telephone Encounter (Signed)
No need to do so.

## 2016-06-24 NOTE — Telephone Encounter (Signed)
Patient needs to know if he needs to stop blood thinner prior to getting teeth cleaned / tg

## 2016-06-24 NOTE — Telephone Encounter (Signed)
Patient aware, ok to hold Xarelto 2 days prior to procedure

## 2016-06-25 ENCOUNTER — Other Ambulatory Visit (INDEPENDENT_AMBULATORY_CARE_PROVIDER_SITE_OTHER): Payer: Self-pay | Admitting: *Deleted

## 2016-06-25 DIAGNOSIS — K921 Melena: Secondary | ICD-10-CM

## 2016-06-28 ENCOUNTER — Ambulatory Visit (INDEPENDENT_AMBULATORY_CARE_PROVIDER_SITE_OTHER): Payer: BLUE CROSS/BLUE SHIELD | Admitting: Pulmonary Disease

## 2016-06-28 ENCOUNTER — Encounter: Payer: Self-pay | Admitting: Pulmonary Disease

## 2016-06-28 VITALS — BP 150/70 | HR 78 | Ht 70.0 in | Wt 166.8 lb

## 2016-06-28 DIAGNOSIS — R911 Solitary pulmonary nodule: Secondary | ICD-10-CM

## 2016-06-28 NOTE — Patient Instructions (Signed)
Sample of ANORO - once daily Use albuterol as needed for wheezing  We will arrange for CT scan in 1-2 weeks Based on this we will arrange for biopsy under general anesthesia You will have to stop blood thinner 4 days prior to biopsy  Keep taking fluid pills

## 2016-06-28 NOTE — Assessment & Plan Note (Signed)
Sample of ANORO - once daily Use albuterol as needed for wheezing  Keep taking fluid pills

## 2016-06-28 NOTE — Assessment & Plan Note (Signed)
We will arrange for CT scan in 1-2 weeks Based on this we will arrange for biopsy under general anesthesia You will have to stop blood thinner 4 days prior to biopsy  We'll reassess him in the office one week before biopsy scheduled to ensure that his heart failure is compensated

## 2016-06-28 NOTE — Progress Notes (Signed)
   Subjective:    Patient ID: Shaun May, male    DOB: 24-Jul-1951, 65 y.o.   MRN: [id number]  HPI  65 year old heavy smoker For follow-up of COPD and lung nodule He smoked about 2 packs per day, more than 100 pack years  03/2016  He presented with shortness of breath cough and wheezing, elevated troponin and new finding of cardiomyopathy on echo with an EF of 35%. He developed new onset atrial fibrillation requiring anticoagulation. Left heart cath showed no evidence of CAD CT angiogram showed incidental finding of 17 mm spiculated right upper lobe nodule   06/28/2016  Chief Complaint  Patient presents with  . Hospitalization Follow-up    patient was seen in the hospital, here for follow up per Dr. Elsworth Soho, pulmonary mass.    Cards FU visit 04/12/16 >> BP was low hence lasix & lisinopril stopped  On last office visit 04/2016 he had increasing pedal edema and dyspnea as well due to  acute systolic heart failure-he was started back on Lasix he was also started on oxygen & biopsy was deferred  He also developed upper GI bleed and was hospitalized 05/2016 and required 4 units PRBC EGD showed bleeding ulcer and Xarelto was held for one week  Follow-up echo showed that pericardial effusion is resolved  Significant tests/ events  CTA chest 4/24>>> 17 mm spiculated posterior right upper lobe nodule L heart cath 4/26>>>1.no evidence of obstructive coronary artery disease. Left dominant system with very short left main. 2. High normal left ventricular end-diastolic pressure. 3. Mildly reduced LV systolic function with an ejection fraction of 45% with apical akinesis.  PFTs >> Severe airway obstruction with FEV1 27%-0.94, no bronchodilator response, ratio 44, DLCO 26%  PET 04/2016 14.5 mm right upper lobe pulmonary nodule is hypermetabolic     Past Medical History:  Diagnosis Date  . Atrial flutter ([place])   . Hemochromatosis   . Hypertension   . Lung nodule   . Mobitz type 2 second  degree heart block    PPM MDT 10/28/15 Dr. Lovena Le  . Pericardial effusion     Review of Systems Patient denies significant dyspnea,cough, hemoptysis,  chest pain, palpitations, pedal edema, orthopnea, paroxysmal nocturnal dyspnea, lightheadedness, nausea, vomiting, abdominal or  leg pains      Objective:   Physical Exam   Gen. Pleasant, well-nourished, in no distress ENT - no lesions, no post nasal drip Neck: No JVD, no thyromegaly, no carotid bruits Lungs: no use of accessory muscles, no dullness to percussion, Bl scattered rhonchi  Cardiovascular: Rhythm regular, heart sounds  normal, no murmurs or gallops, 1+ peripheral edema Musculoskeletal: No deformities, no cyanosis or clubbing         Assessment & Plan:

## 2016-06-29 ENCOUNTER — Telehealth: Payer: Self-pay | Admitting: Pulmonary Disease

## 2016-06-29 NOTE — Telephone Encounter (Signed)
ATC pt. Unable to LVM as VM was not set up.

## 2016-06-30 NOTE — Telephone Encounter (Signed)
Will forward to RA to make him aware of pts decision.

## 2016-07-01 ENCOUNTER — Other Ambulatory Visit (INDEPENDENT_AMBULATORY_CARE_PROVIDER_SITE_OTHER): Payer: Self-pay | Admitting: *Deleted

## 2016-07-01 ENCOUNTER — Encounter (INDEPENDENT_AMBULATORY_CARE_PROVIDER_SITE_OTHER): Payer: Self-pay | Admitting: *Deleted

## 2016-07-01 ENCOUNTER — Ambulatory Visit: Payer: BLUE CROSS/BLUE SHIELD | Admitting: Cardiovascular Disease

## 2016-07-01 ENCOUNTER — Ambulatory Visit (INDEPENDENT_AMBULATORY_CARE_PROVIDER_SITE_OTHER): Payer: BLUE CROSS/BLUE SHIELD | Admitting: Cardiovascular Disease

## 2016-07-01 ENCOUNTER — Encounter: Payer: Self-pay | Admitting: Cardiovascular Disease

## 2016-07-01 VITALS — BP 139/74 | HR 73 | Ht 70.0 in | Wt 167.0 lb

## 2016-07-01 DIAGNOSIS — I441 Atrioventricular block, second degree: Secondary | ICD-10-CM

## 2016-07-01 DIAGNOSIS — I429 Cardiomyopathy, unspecified: Secondary | ICD-10-CM | POA: Diagnosis not present

## 2016-07-01 DIAGNOSIS — I319 Disease of pericardium, unspecified: Secondary | ICD-10-CM

## 2016-07-01 DIAGNOSIS — I5022 Chronic systolic (congestive) heart failure: Secondary | ICD-10-CM

## 2016-07-01 DIAGNOSIS — I10 Essential (primary) hypertension: Secondary | ICD-10-CM

## 2016-07-01 DIAGNOSIS — Z950 Presence of cardiac pacemaker: Secondary | ICD-10-CM

## 2016-07-01 DIAGNOSIS — I4892 Unspecified atrial flutter: Secondary | ICD-10-CM

## 2016-07-01 DIAGNOSIS — I313 Pericardial effusion (noninflammatory): Secondary | ICD-10-CM

## 2016-07-01 DIAGNOSIS — I3139 Other pericardial effusion (noninflammatory): Secondary | ICD-10-CM

## 2016-07-01 DIAGNOSIS — K921 Melena: Secondary | ICD-10-CM

## 2016-07-01 NOTE — Progress Notes (Signed)
SUBJECTIVE: The patient presents for routine follow-up. He has a history of chronic systolic heart failure, nonischemic cardiomyopathy, pacemaker, COPD, alcohol and tobacco abuse, lung nodule, and pericardial effusion.  Most recent echocardiogram on 05/04/16 demonstrated normalization of LVEF to 60-65%, previously had been 35-40%. There was mild LVH, normal regional wall motion, severe left atrial dilatation, moderate right ventricular dilatation with mild to moderately reduced right ventricular systolic function, and a moderate circumferential pericardial effusion measuring 1.2 cm in diastole adjacent to the LV apex. There was no evidence of temporal not physiology and the fusion overall appeared smaller in size compared to study dated 04/12/16.  There was no evidence of obstructive coronary artery disease by coronary angiography 03/31/16.  Had been bruising and coughing up blood, Xarelto was temporarily held then restarted by patient.  Repeat CBC to be performed next week. Has a recent h/o transfusion-dependent anemia.  Has exertional dyspnea. No significant leg swelling.  Review of Systems: As per "subjective", otherwise negative.  No Known Allergies  Current Outpatient Prescriptions  Medication Sig Dispense Refill  . albuterol (PROVENTIL) (2.5 MG/3ML) 0.083% nebulizer solution Take 3 mLs (2.5 mg total) by nebulization every 4 (four) hours as needed for wheezing or shortness of breath. 75 mL 12  . atorvastatin (LIPITOR) 40 MG tablet Take 1 tablet (40 mg total) by mouth daily at 6 PM. 30 tablet 6  . carvedilol (COREG) 25 MG tablet Take 12.5 mg by mouth 2 (two) times daily with a meal.   11  . furosemide (LASIX) 40 MG tablet Take 40 mg by mouth daily.     Marland Kitchen guaiFENesin (MUCINEX) 600 MG 12 hr tablet Take 1 tablet (600 mg total) by mouth 2 (two) times daily. 30 tablet 0  . LORazepam (ATIVAN) 2 MG tablet Take 1 tablet by mouth 4 (four) times daily.     . nicotine (NICODERM CQ - DOSED IN  MG/24 HOURS) 21 mg/24hr patch Place 1 patch (21 mg total) onto the skin daily. 28 patch 0  . oxyCODONE-acetaminophen (PERCOCET/ROXICET) 5-325 MG tablet Take 1-2 tablets by mouth every 6 (six) hours as needed. (Patient taking differently: Take 1 tablet by mouth 4 (four) times daily. ) 15 tablet 0  . pantoprazole (PROTONIX) 40 MG tablet Take 1 tablet (40 mg total) by mouth 2 (two) times daily before a meal. 60 tablet 2  . polyethylene glycol-electrolytes (TRILYTE) 420 g solution Take 4,000 mLs by mouth once. 4000 mL 0  . potassium chloride SA (K-DUR,KLOR-CON) 20 MEQ tablet Take 1 tablet by mouth daily.    Marland Kitchen PROAIR HFA 108 (90 Base) MCG/ACT inhaler Inhale 2 puffs into the lungs every 4 (four) hours as needed for wheezing or shortness of breath. 1 Inhaler 0  . rivaroxaban (XARELTO) 20 MG TABS tablet Take 20 mg by mouth daily with supper.    Marland Kitchen rOPINIRole (REQUIP) 0.25 MG tablet Take 1-3 tablets by mouth at bedtime.   1   No current facility-administered medications for this visit.     Past Medical History:  Diagnosis Date  . Atrial flutter ([place])   . Hemochromatosis   . Hypertension   . Lung nodule   . Mobitz type 2 second degree heart block    PPM MDT 10/28/15 Dr. Lovena Le  . Pericardial effusion     Past Surgical History:  Procedure Laterality Date  . CARDIAC CATHETERIZATION N/A 03/31/2016   Procedure: Left Heart Cath and Coronary Angiography;  Surgeon: Wellington Hampshire, MD;  Location: [facility];  Service: Cardiovascular;  Laterality: N/A;  . COLONOSCOPY    . EP IMPLANTABLE DEVICE N/A 10/28/2015   Procedure: Pacemaker Implant;  Surgeon: Evans Lance, MD;  Location: [facility];  Service: Cardiovascular;  Laterality: N/A;  . EP IMPLANTABLE DEVICE N/A 11/07/2015   Procedure: Pocket Revision;  Surgeon: Evans Lance, MD;  Location: [facility];  Service: Cardiovascular;  Laterality: N/A;  . ESOPHAGOGASTRODUODENOSCOPY N/A 05/27/2016   Procedure:  ESOPHAGOGASTRODUODENOSCOPY (EGD);  Surgeon: Rogene Houston, MD;  [facility];  Service: Endoscopy;  Laterality: N/A;  . TONSILLECTOMY      Social History   Social History  . Marital status: Divorced    Spouse name: N/A  . Number of children: N/A  . Years of education: N/A   Occupational History  . Not on file.   Social History Main Topics  . Smoking status: Current Every Day Smoker    Packs/day: 2.00    Years: 56.00    Types: E-cigarettes, Cigarettes  . Smokeless tobacco: Never Used     Comment: e cigarettes  . Alcohol use 12.6 oz/week    21 Shots of liquor per week     Comment: nightly  . Drug use: No  . Sexual activity: Not on file   Other Topics Concern  . Not on file   Social History Narrative  . No narrative on file     Vitals:   07/01/16 1101  BP: 139/74  Pulse: 73  SpO2: 94%  Weight: 167 lb (75.8 kg)  Height: '5\' 10"'$  (1.778 m)    PHYSICAL EXAM General: NAD HEENT: Poor dentition. Neck: No JVD, no thyromegaly. Lungs: Poor air movement, no rales. CV: Nondisplaced PMI.  Regular rate and rhythm, normal S1/S2, no S3/S4, no murmur. Trace pretibial edema b/l.     Abdomen: Soft, nontender, no distention.  Neurologic: Alert and oriented.  Psych: Normal affect. Skin: Normal. Musculoskeletal: No gross deformities.    ECG: Most recent ECG reviewed.      ASSESSMENT AND PLAN: 1. Pericardial effusion: Trivial in size 7/10.  2. Cardiomyopathy/chronic systolic heart failure: EF 45% 7/10. Continue Coreg and Lasix. Euvolemic.  3. Pacemaker: Followed by Dr. Lovena Le.  4. Paroxysmal atrial flutter: On Xarelto and Coreg.  5. Hypotension: Resolved off of ACEI.   Dispo: fu 6 months.   Kate Sable, M.D., F.A.C.C.

## 2016-07-01 NOTE — Patient Instructions (Signed)
Your physician wants you to follow-up in: 6 Months with Dr. Koneswaran. You will receive a reminder letter in the mail two months in advance. If you don't receive a letter, please call our office to schedule the follow-up appointment.  Your physician recommends that you continue on your current medications as directed. Please refer to the Current Medication list given to you today.  If you need a refill on your cardiac medications before your next appointment, please call your pharmacy.  Thank you for choosing [facility]!   

## 2016-07-06 NOTE — Telephone Encounter (Signed)
Per Dr. Elsworth Soho, do not place order for CT for a week.  Placed reminder will order in 1 week.

## 2016-07-06 NOTE — Telephone Encounter (Signed)
Discussed with patient- he does not want any biopsy procedures or radiation therapy He understands risk of tumor will increase I was able to convince him to get a CT scan in September Please schedule-CT chest with contrast (super D protocol)

## 2016-07-09 LAB — CBC
HEMATOCRIT: 29 % — AB (ref 38.5–50.0)
HEMOGLOBIN: 8.8 g/dL — AB (ref 13.2–17.1)
MCH: 23 pg — ABNORMAL LOW (ref 27.0–33.0)
MCHC: 30.3 g/dL — ABNORMAL LOW (ref 32.0–36.0)
MCV: 75.7 fL — AB (ref 80.0–100.0)
MPV: 8.7 fL (ref 7.5–12.5)
Platelets: 272 10*3/uL (ref 140–400)
RBC: 3.83 MIL/uL — ABNORMAL LOW (ref 4.20–5.80)
RDW: 18.4 % — ABNORMAL HIGH (ref 11.0–15.0)
WBC: 3.7 10*3/uL — AB (ref 3.8–10.8)

## 2016-07-12 ENCOUNTER — Ambulatory Visit (INDEPENDENT_AMBULATORY_CARE_PROVIDER_SITE_OTHER): Payer: BLUE CROSS/BLUE SHIELD | Admitting: Internal Medicine

## 2016-07-12 ENCOUNTER — Other Ambulatory Visit (INDEPENDENT_AMBULATORY_CARE_PROVIDER_SITE_OTHER): Payer: Self-pay | Admitting: *Deleted

## 2016-07-12 ENCOUNTER — Encounter (INDEPENDENT_AMBULATORY_CARE_PROVIDER_SITE_OTHER): Payer: Self-pay | Admitting: *Deleted

## 2016-07-12 DIAGNOSIS — K921 Melena: Secondary | ICD-10-CM

## 2016-07-13 ENCOUNTER — Telehealth: Payer: Self-pay | Admitting: *Deleted

## 2016-07-13 DIAGNOSIS — R911 Solitary pulmonary nodule: Secondary | ICD-10-CM

## 2016-07-13 NOTE — Telephone Encounter (Signed)
Order for CT with Super D Protocol entered. Nothing further needed.

## 2016-07-13 NOTE — Telephone Encounter (Signed)
-----   Message from Glean Hess, [place] sent at 07/06/2016 12:25 PM EDT ----- Regarding: CT chest with contrast (super D protocol) Order in 1 week per Dr. Elsworth Soho

## 2016-07-14 ENCOUNTER — Ambulatory Visit (HOSPITAL_COMMUNITY): Payer: BLUE CROSS/BLUE SHIELD

## 2016-07-15 ENCOUNTER — Ambulatory Visit: Payer: BLUE CROSS/BLUE SHIELD | Admitting: Cardiovascular Disease

## 2016-07-22 ENCOUNTER — Encounter (HOSPITAL_COMMUNITY): Payer: Self-pay

## 2016-07-22 ENCOUNTER — Encounter (HOSPITAL_COMMUNITY)
Admission: RE | Admit: 2016-07-22 | Discharge: 2016-07-22 | Disposition: A | Discharge status: Home / Self Care | Payer: BLUE CROSS/BLUE SHIELD | Source: Ambulatory Visit | Attending: Internal Medicine | Admitting: Internal Medicine

## 2016-07-22 DIAGNOSIS — D509 Iron deficiency anemia, unspecified: Secondary | ICD-10-CM | POA: Diagnosis not present

## 2016-07-22 MED ORDER — SODIUM CHLORIDE 0.9 % IV SOLN
INTRAVENOUS | Status: DC
  Administered 2016-07-22: 09:00:00 via INTRAVENOUS

## 2016-07-22 MED ORDER — SODIUM CHLORIDE 0.9 % IV SOLN
510.0000 mg | Freq: Once | INTRAVENOUS | Status: AC
  Administered 2016-07-22: 510 mg via INTRAVENOUS
  Filled 2016-07-22: qty 17

## 2016-07-26 LAB — CBC
HEMATOCRIT: 32.3 % — AB (ref 38.5–50.0)
Hemoglobin: 9.8 g/dL — ABNORMAL LOW (ref 13.2–17.1)
MCH: 23.8 pg — ABNORMAL LOW (ref 27.0–33.0)
MCHC: 30.3 g/dL — ABNORMAL LOW (ref 32.0–36.0)
MCV: 78.6 fL — ABNORMAL LOW (ref 80.0–100.0)
MPV: 8.1 fL (ref 7.5–12.5)
PLATELETS: 296 10*3/uL (ref 140–400)
RBC: 4.11 MIL/uL — ABNORMAL LOW (ref 4.20–5.80)
RDW: 21.3 % — AB (ref 11.0–15.0)
WBC: 4.5 10*3/uL (ref 3.8–10.8)

## 2016-07-28 ENCOUNTER — Other Ambulatory Visit (INDEPENDENT_AMBULATORY_CARE_PROVIDER_SITE_OTHER): Payer: Self-pay | Admitting: *Deleted

## 2016-07-28 DIAGNOSIS — D508 Other iron deficiency anemias: Secondary | ICD-10-CM

## 2016-07-29 ENCOUNTER — Encounter (HOSPITAL_COMMUNITY)
Admission: RE | Admit: 2016-07-29 | Discharge: 2016-07-29 | Disposition: A | Discharge status: Home / Self Care | Payer: BLUE CROSS/BLUE SHIELD | Source: Ambulatory Visit | Attending: Internal Medicine | Admitting: Internal Medicine

## 2016-07-29 DIAGNOSIS — D509 Iron deficiency anemia, unspecified: Secondary | ICD-10-CM | POA: Diagnosis not present

## 2016-07-29 MED ORDER — SODIUM CHLORIDE 0.9 % IV SOLN
510.0000 mg | Freq: Once | INTRAVENOUS | Status: AC
  Administered 2016-07-29: 510 mg via INTRAVENOUS
  Filled 2016-07-29: qty 17

## 2016-07-29 MED ORDER — SODIUM CHLORIDE 0.9 % IV SOLN
Freq: Once | INTRAVENOUS | Status: AC
  Administered 2016-07-29: 09:00:00 via INTRAVENOUS

## 2016-07-30 ENCOUNTER — Other Ambulatory Visit: Payer: Self-pay | Admitting: Physician Assistant

## 2016-08-02 ENCOUNTER — Ambulatory Visit: Payer: BLUE CROSS/BLUE SHIELD | Admitting: Pulmonary Disease

## 2016-08-02 ENCOUNTER — Other Ambulatory Visit (INDEPENDENT_AMBULATORY_CARE_PROVIDER_SITE_OTHER): Payer: Self-pay | Admitting: *Deleted

## 2016-08-02 ENCOUNTER — Encounter (INDEPENDENT_AMBULATORY_CARE_PROVIDER_SITE_OTHER): Payer: Self-pay | Admitting: *Deleted

## 2016-08-02 DIAGNOSIS — D508 Other iron deficiency anemias: Secondary | ICD-10-CM

## 2016-08-10 ENCOUNTER — Encounter (INDEPENDENT_AMBULATORY_CARE_PROVIDER_SITE_OTHER): Payer: Self-pay | Admitting: *Deleted

## 2016-08-11 LAB — CBC
HEMATOCRIT: 40.4 % (ref 38.5–50.0)
Hemoglobin: 12.6 g/dL — ABNORMAL LOW (ref 13.2–17.1)
MCH: 26 pg — ABNORMAL LOW (ref 27.0–33.0)
MCHC: 31.2 g/dL — ABNORMAL LOW (ref 32.0–36.0)
MCV: 83.3 fL (ref 80.0–100.0)
MPV: 9.1 fL (ref 7.5–12.5)
Platelets: 199 10*3/uL (ref 140–400)
RBC: 4.85 MIL/uL (ref 4.20–5.80)
RDW: 24.4 % — AB (ref 11.0–15.0)
WBC: 5.6 10*3/uL (ref 3.8–10.8)

## 2016-08-30 ENCOUNTER — Telehealth: Payer: Self-pay | Admitting: Cardiology

## 2016-08-30 ENCOUNTER — Ambulatory Visit (INDEPENDENT_AMBULATORY_CARE_PROVIDER_SITE_OTHER): Payer: BLUE CROSS/BLUE SHIELD | Admitting: *Deleted

## 2016-08-30 DIAGNOSIS — I441 Atrioventricular block, second degree: Secondary | ICD-10-CM | POA: Diagnosis not present

## 2016-08-30 NOTE — Progress Notes (Signed)
Remote pacemaker transmission.   

## 2016-08-30 NOTE — Telephone Encounter (Signed)
Confirmed remote transmission w/ pt wife.   

## 2016-09-01 ENCOUNTER — Encounter: Payer: Self-pay | Admitting: Cardiology

## 2016-09-14 ENCOUNTER — Telehealth (INDEPENDENT_AMBULATORY_CARE_PROVIDER_SITE_OTHER): Payer: Self-pay | Admitting: *Deleted

## 2016-09-14 ENCOUNTER — Telehealth (INDEPENDENT_AMBULATORY_CARE_PROVIDER_SITE_OTHER): Payer: Self-pay | Admitting: Internal Medicine

## 2016-09-14 DIAGNOSIS — K219 Gastro-esophageal reflux disease without esophagitis: Secondary | ICD-10-CM

## 2016-09-14 MED ORDER — PANTOPRAZOLE SODIUM 40 MG PO TBEC: 40.0000 mg | DELAYED_RELEASE_TABLET | Freq: Two times a day (BID) | ORAL | 2 refills | Status: DC

## 2016-09-14 NOTE — Telephone Encounter (Signed)
error 

## 2016-09-14 NOTE — Telephone Encounter (Signed)
Patient left message, he needs refill on pantoprazole, uses [facility]  Phone# [phone]

## 2016-09-14 NOTE — Telephone Encounter (Signed)
Rx given to [facility] to fax

## 2016-09-14 NOTE — Telephone Encounter (Signed)
Rx sent to his pharmacy

## 2016-09-15 ENCOUNTER — Encounter: Payer: Self-pay | Admitting: Cardiology

## 2016-09-22 ENCOUNTER — Other Ambulatory Visit: Payer: Self-pay | Admitting: Physician Assistant

## 2016-09-23 LAB — CUP PACEART REMOTE DEVICE CHECK
Battery Impedance: 100 Ohm
Battery Remaining Longevity: 140 mo
Battery Voltage: 2.79 V
Brady Statistic AP VP Percent: 11 %
Brady Statistic AP VS Percent: 2 %
Brady Statistic AS VP Percent: 80 %
Brady Statistic AS VS Percent: 6 %
Date Time Interrogation Session: 20170925142708
Implantable Lead Implant Date: 20161122
Implantable Lead Implant Date: 20161122
Implantable Lead Location: 753859
Implantable Lead Location: 753860
Implantable Lead Model: 5076
Implantable Lead Model: 5076
Lead Channel Impedance Value: 560 Ohm
Lead Channel Impedance Value: 569 Ohm
Lead Channel Pacing Threshold Amplitude: 0.5 V
Lead Channel Pacing Threshold Amplitude: 0.75 V
Lead Channel Pacing Threshold Pulse Width: 0.4 ms
Lead Channel Pacing Threshold Pulse Width: 0.4 ms
Lead Channel Sensing Intrinsic Amplitude: 2.8 mV
Lead Channel Setting Pacing Amplitude: 2 V
Lead Channel Setting Pacing Amplitude: 2.5 V
Lead Channel Setting Pacing Pulse Width: 0.4 ms
Lead Channel Setting Sensing Sensitivity: 4 mV

## 2016-10-07 ENCOUNTER — Emergency Department (HOSPITAL_COMMUNITY): Payer: BLUE CROSS/BLUE SHIELD

## 2016-10-07 ENCOUNTER — Encounter (HOSPITAL_COMMUNITY): Payer: Self-pay | Admitting: Emergency Medicine

## 2016-10-07 ENCOUNTER — Emergency Department (HOSPITAL_COMMUNITY)
Admission: EM | Admit: 2016-10-07 | Discharge: 2016-10-07 | Disposition: A | Discharge status: Home / Self Care | Payer: BLUE CROSS/BLUE SHIELD | Attending: Emergency Medicine | Admitting: Emergency Medicine

## 2016-10-07 DIAGNOSIS — K625 Hemorrhage of anus and rectum: Secondary | ICD-10-CM | POA: Diagnosis not present

## 2016-10-07 DIAGNOSIS — I5023 Acute on chronic systolic (congestive) heart failure: Secondary | ICD-10-CM | POA: Insufficient documentation

## 2016-10-07 DIAGNOSIS — I110 Hypertensive heart disease with heart failure: Secondary | ICD-10-CM | POA: Insufficient documentation

## 2016-10-07 DIAGNOSIS — J449 Chronic obstructive pulmonary disease, unspecified: Secondary | ICD-10-CM | POA: Insufficient documentation

## 2016-10-07 DIAGNOSIS — F17210 Nicotine dependence, cigarettes, uncomplicated: Secondary | ICD-10-CM | POA: Diagnosis not present

## 2016-10-07 DIAGNOSIS — Z79899 Other long term (current) drug therapy: Secondary | ICD-10-CM | POA: Insufficient documentation

## 2016-10-07 LAB — CBC
HEMATOCRIT: 42.1 % (ref 39.0–52.0)
HEMOGLOBIN: 14 g/dL (ref 13.0–17.0)
MCH: 30.1 pg (ref 26.0–34.0)
MCHC: 33.3 g/dL (ref 30.0–36.0)
MCV: 90.5 fL (ref 78.0–100.0)
Platelets: 153 10*3/uL (ref 150–400)
RBC: 4.65 MIL/uL (ref 4.22–5.81)
RDW: 18.6 % — ABNORMAL HIGH (ref 11.5–15.5)
WBC: 6.1 10*3/uL (ref 4.0–10.5)

## 2016-10-07 LAB — POC OCCULT BLOOD, ED: FECAL OCCULT BLD: POSITIVE — AB

## 2016-10-07 LAB — COMPREHENSIVE METABOLIC PANEL
ALBUMIN: 4 g/dL (ref 3.5–5.0)
ALT: 37 U/L (ref 17–63)
ANION GAP: 7 (ref 5–15)
AST: 39 U/L (ref 15–41)
Alkaline Phosphatase: 59 U/L (ref 38–126)
BILIRUBIN TOTAL: 1.4 mg/dL — AB (ref 0.3–1.2)
BUN: 16 mg/dL (ref 6–20)
CHLORIDE: 95 mmol/L — AB (ref 101–111)
CO2: 32 mmol/L (ref 22–32)
Calcium: 8.8 mg/dL — ABNORMAL LOW (ref 8.9–10.3)
Creatinine, Ser: 1.13 mg/dL (ref 0.61–1.24)
GFR calc Af Amer: >60 mL/min (ref >60)
GLUCOSE: 111 mg/dL — AB (ref 65–99)
POTASSIUM: 3.8 mmol/L (ref 3.5–5.1)
Sodium: 134 mmol/L — ABNORMAL LOW (ref 135–145)
TOTAL PROTEIN: 7.1 g/dL (ref 6.5–8.1)

## 2016-10-07 LAB — TYPE AND SCREEN
ABO/RH(D): O POS
ANTIBODY SCREEN: NEGATIVE

## 2016-10-07 LAB — PROTIME-INR
INR: 1.38
PROTHROMBIN TIME: 17.1 s — AB (ref 11.4–15.2)

## 2016-10-07 LAB — TROPONIN I

## 2016-10-07 LAB — BRAIN NATRIURETIC PEPTIDE: B NATRIURETIC PEPTIDE 5: 88 pg/mL (ref 0.0–100.0)

## 2016-10-07 NOTE — ED Triage Notes (Signed)
Pt reports rectal bleeding that started over the past week. Pt states the blood is bright red. Pt has had hx of bleeding related anema. Has hx of internal hemorrhoids.

## 2016-10-07 NOTE — ED Provider Notes (Signed)
Emergency Department Provider Note  By signing my name below, I, Sonum Patel, attest that this documentation has been prepared under the direction and in the presence of Margette Fast, MD. Electronically Signed: Sonum Patel, Education administrator. 10/07/16. 11:36 AM.   I have reviewed the triage vital signs and the nursing notes.   HISTORY  Chief Complaint Rectal Bleeding  HPI Comments: Shaun May is a 65 y.o. male who presents to the Emergency Department complaining of intermittent, unchanged rectal bleeding for the past week. He reports noticing bright red blood in the toilet and when wiping. He also reports spitting blood from his mouth after eating a ham biscuit. He currently takes Xarelto. He reports history of anemia related to a similar bleeding from internal hemorrhoids. He denies melena, fever, chills, CP, rectal pain, abdominal pain. He has an upcoming appointment for a colonoscopy.   Past Medical History:  Diagnosis Date  . Atrial flutter ([place])   . Hemochromatosis   . Hypertension   . Lung nodule   . Mobitz type 2 second degree heart block    PPM MDT 10/28/15 Dr. Lovena Le  . Pericardial effusion     Patient Active Problem List   Diagnosis Date Noted  . Symptomatic anemia 05/25/2016  . Anemia 05/25/2016  . Hypokalemia 05/25/2016  . Hyponatremia 05/25/2016  . Atrial flutter ([place]), paroxysmal   . Pericardial effusion   . Acute on chronic systolic (congestive) heart failure 03/29/2016  . COPD (chronic obstructive pulmonary disease) ([place]) 03/29/2016  . Elevated troponin 03/29/2016  . Restless leg syndrome 03/29/2016  . Pulmonary nodule, right 03/29/2016  . Chest pain 03/27/2016  . COPD exacerbation ([place]) 03/27/2016  . Hypertension   . Essential hypertension   . Bradycardia 10/25/2015  . Heart block AV second degree 10/25/2015  . Accelerated hypertension 10/25/2015  . Hemochromatosis 10/25/2015  . Tobacco abuse 10/25/2015  . Alcohol dependence ([place]) 10/25/2015  . Second  degree AV block 10/25/2015    Past Surgical History:  Procedure Laterality Date  . CARDIAC CATHETERIZATION N/A 03/31/2016   Procedure: Left Heart Cath and Coronary Angiography;  Surgeon: Wellington Hampshire, MD;  Location: [facility];  Service: Cardiovascular;  Laterality: N/A;  . COLONOSCOPY    . EP IMPLANTABLE DEVICE N/A 10/28/2015   Procedure: Pacemaker Implant;  Surgeon: Evans Lance, MD;  Location: [facility];  Service: Cardiovascular;  Laterality: N/A;  . EP IMPLANTABLE DEVICE N/A 11/07/2015   Procedure: Pocket Revision;  Surgeon: Evans Lance, MD;  Location: [facility];  Service: Cardiovascular;  Laterality: N/A;  . ESOPHAGOGASTRODUODENOSCOPY N/A 05/27/2016   Procedure: ESOPHAGOGASTRODUODENOSCOPY (EGD);  Surgeon: Rogene Houston, MD;  [facility];  Service: Endoscopy;  Laterality: N/A;  . TONSILLECTOMY      Current Outpatient Rx  . Order #: [id number] Class: Normal  . Order #: [id number] Class: Normal  . Order #: [id number] Class: Historical Med  . Order #: [id number] Class: Historical Med  . Order #: [id number] Class: Normal  . Order #: [id number] Class: Historical Med  . Order #: [id number] Class: Print  . Order #: [id number] Class: Print  . Order #: [id number] Class: Historical Med  . Order #: [id number] Class: Normal  . Order #: [id number] Class: Historical Med  . Order #: [id number] Class: Historical Med    Allergies Review of patient's allergies indicates no known allergies.  Family History  Problem Relation Age of Onset  . Diabetes Other   . Dementia Mother     Social History Social History  Substance Use  Topics  . Smoking status: Current Every Day Smoker    Packs/day: 2.00    Years: 56.00    Types: E-cigarettes, Cigarettes  . Smokeless tobacco: Never Used     Comment: e cigarettes  . Alcohol use 12.6 oz/week    21 Shots of liquor per week     Comment: nightly    Review of Systems Constitutional: No fever/chills Eyes: No visual  changes. ENT: No sore throat.  Cardiovascular: Denies chest pain. Respiratory: Denies shortness of breath. Gastrointestinal: No abdominal pain.  No nausea, no vomiting.  No diarrhea.  No constipation. Genitourinary: +rectal bleeding. No rectal pain. Negative for dysuria. No melena  Musculoskeletal: Negative for back pain. Skin: Negative for rash. Neurological: Negative for headaches, focal weakness or numbness.  10-point ROS otherwise negative.  ____________________________________________   PHYSICAL EXAM:  VITAL SIGNS: ED Triage Vitals  Enc Vitals Group     BP 10/07/16 1104 164/92     Pulse Rate 10/07/16 1104 64     Resp 10/07/16 1104 12     Temp 10/07/16 1104 98.4 F (36.9 C)     Temp Source 10/07/16 1104 Oral     SpO2 10/07/16 1104 96 %     Weight 10/07/16 1105 170 lb (77.1 kg)     Height 10/07/16 1105 '5\' 10"'$  (1.778 m)     Pain Score 10/07/16 1105 0   Constitutional: Alert and oriented. Well appearing and in no acute distress. Eyes: Conjunctivae are normal. Head: Atraumatic. Nose: No congestion/rhinnorhea. Mouth/Throat: Mucous membranes are moist.  Oropharynx non-erythematous. Neck: No stridor.   Cardiovascular: Normal rate, regular rhythm. Good peripheral circulation. Grossly normal heart sounds.   Respiratory: Normal respiratory effort.  No retractions. Lungs CTAB. Gastrointestinal: Soft and nontender. No distention. Brown stool on rectal exam. No gross blood or melena.  Musculoskeletal: No lower extremity tenderness nor edema. No gross deformities of extremities. Neurologic:  Normal speech and language. No gross focal neurologic deficits are appreciated.  Skin:  Skin is warm, dry and intact. No rash noted.  ____________________________________________   LABS (all labs ordered are listed, but only abnormal results are displayed)  Labs Reviewed  COMPREHENSIVE METABOLIC PANEL - Abnormal; Notable for the following:       Result Value   Sodium 134 (*)    Chloride  95 (*)    Glucose, Bld 111 (*)    Calcium 8.8 (*)    Total Bilirubin 1.4 (*)    All other components within normal limits  CBC - Abnormal; Notable for the following:    RDW 18.6 (*)    All other components within normal limits  PROTIME-INR - Abnormal; Notable for the following:    Prothrombin Time 17.1 (*)    All other components within normal limits  POC OCCULT BLOOD, ED - Abnormal; Notable for the following:    Fecal Occult Bld POSITIVE (*)    All other components within normal limits  BRAIN NATRIURETIC PEPTIDE  TROPONIN I  POC OCCULT BLOOD, ED  TYPE AND SCREEN   ____________________________________________  EKG   EKG Interpretation  Date/Time:  Thursday October 07 2016 12:12:49 EDT Ventricular Rate:  60 PR Interval:    QRS Duration: 182 QT Interval:  473 QTC Calculation: 473 R Axis:   -92 Text Interpretation:  Atrial-ventricular dual-paced rhythm No further analysis attempted due to paced rhythm Baseline wander in lead(s) V6 Similar to prior. No STEMI.  Confirmed by Trystan Eads MD, Elisha Cooksey [phone]) on 10/07/2016 12:24:02 PM  ____________________________________________  RADIOLOGY  Dg Chest 2 View  Result Date: 10/07/2016 CLINICAL DATA:  Rectal bleeding, hemoptysis, hypertension, smoker, hemochromatosis EXAM: CHEST  2 VIEW COMPARISON:  05/25/2016, CT chest 03/29/2016 FINDINGS: LEFT subclavian transvenous pacemaker leads project at RIGHT atrium and RIGHT ventricle, unchanged. Normal heart size, mediastinal contours, and pulmonary vascularity. Lungs appear emphysematous with minimal central peribronchial thickening. No acute infiltrate, pleural effusion, or pneumothorax. Chronic nodular density projecting over the thoracic spinal lateral view, corresponds to bulky spur formation on prior CT exam, stable. RIGHT upper lobe nodular density identified on prior CT chest, hypermetabolic on interval PET-CT, is not radiographically evident. Endplate spur formation throughout thoracic  spine. Aortic calcification. IMPRESSION: COPD changes without acute infiltrate. RIGHT upper lobe nodule density seen on prior CT and PET-CT exams is not radiographically evident. Aortic atherosclerosis. Electronically Signed   By: Lavonia Dana M.D.   On: 10/07/2016 12:11    ____________________________________________   PROCEDURES  Procedure(s) performed:   Procedures  None ____________________________________________   INITIAL IMPRESSION / ASSESSMENT AND PLAN / ED COURSE  Pertinent labs & imaging results that were available during my care of the patient were reviewed by me and considered in my medical decision making (see chart for details).  Patient on his realtor presents emergency department for evaluation of small amount of bright red blood in the toilet water this morning. Also feels he may be spitting out small amounts of pink blood. No acute distress. No associated abdominal pain. He has known history of ulcers and internal hemorrhoids. He has colonoscopy scheduled for next week. Exam shows brown stool that is Hemoccult positive. Plan for additional lab work along with cardiac markers to evaluate for atypical ACS presentation.   02:07 PM Discussed lab results with the patient in detail. His hemoglobin and hematocrit are normal. He does have Hemoccult positive stool but no gross blood or melena on exam. I discussed that given his use of Xarelto he is at high risk for large volume GI bleeding. The patient has a colonoscopy scheduled for next week and a known history of internal hemorrhoids. The patient reports that his preference would be to return home for expectant management and will return immediately to the emergency department with any additional bleeding or lightheadedness. I offered observational admission to trend his blood work and monitor for additional bloody bowel movements but the patient prefers to return home. After out discussion and shared decision making process I plan  to discharge the patient home with PCP follow up plan and very strict return precautions.   ____________________________________________  FINAL CLINICAL IMPRESSION(S) / ED DIAGNOSES  Final diagnoses:  Rectal bleeding     MEDICATIONS GIVEN DURING THIS VISIT:  None  NEW OUTPATIENT MEDICATIONS STARTED DURING THIS VISIT:  None   Note:  This document was prepared using Dragon voice recognition software and may include unintentional dictation errors.  Nanda Quinton, MD Emergency Medicine   I personally performed the services described in this documentation, which was scribed in my presence. The recorded information has been reviewed and is accurate.      Margette Fast, MD 10/07/16 [phone]

## 2016-10-07 NOTE — Discharge Instructions (Signed)
You were seen in the ED today with blood in the stool. Your labs showed blood in the stool but were otherwise normal. You are on a blood thinner which could make this issue worse. You should return to the ED immediately with any additional blood in the stool, difficulty breathing, coughing/vomiting up blood, or chest pain.   Keep your appointment next week for your colonoscopy and discuss your ED presentation with your PCP this afternoon to schedule a follow up appointment.

## 2016-10-07 NOTE — ED Notes (Signed)
Patient transported to X-ray 

## 2016-10-13 ENCOUNTER — Ambulatory Visit (HOSPITAL_COMMUNITY)
Admission: RE | Admit: 2016-10-13 | Discharge: 2016-10-13 | Disposition: A | Discharge status: Home / Self Care | Payer: BLUE CROSS/BLUE SHIELD | Source: Ambulatory Visit | Attending: Internal Medicine | Admitting: Internal Medicine

## 2016-10-13 ENCOUNTER — Encounter (HOSPITAL_COMMUNITY)
Admission: RE | Disposition: A | Discharge status: Home / Self Care | Payer: Self-pay | Source: Ambulatory Visit | Attending: Internal Medicine

## 2016-10-13 ENCOUNTER — Encounter (HOSPITAL_COMMUNITY): Payer: Self-pay | Admitting: *Deleted

## 2016-10-13 DIAGNOSIS — K31811 Angiodysplasia of stomach and duodenum with bleeding: Secondary | ICD-10-CM | POA: Insufficient documentation

## 2016-10-13 DIAGNOSIS — Z1211 Encounter for screening for malignant neoplasm of colon: Secondary | ICD-10-CM | POA: Insufficient documentation

## 2016-10-13 DIAGNOSIS — Z7901 Long term (current) use of anticoagulants: Secondary | ICD-10-CM | POA: Diagnosis not present

## 2016-10-13 DIAGNOSIS — K644 Residual hemorrhoidal skin tags: Secondary | ICD-10-CM | POA: Insufficient documentation

## 2016-10-13 DIAGNOSIS — K921 Melena: Secondary | ICD-10-CM

## 2016-10-13 DIAGNOSIS — Z79899 Other long term (current) drug therapy: Secondary | ICD-10-CM | POA: Insufficient documentation

## 2016-10-13 DIAGNOSIS — Z833 Family history of diabetes mellitus: Secondary | ICD-10-CM | POA: Diagnosis not present

## 2016-10-13 DIAGNOSIS — Z8711 Personal history of peptic ulcer disease: Secondary | ICD-10-CM

## 2016-10-13 DIAGNOSIS — K317 Polyp of stomach and duodenum: Secondary | ICD-10-CM | POA: Insufficient documentation

## 2016-10-13 DIAGNOSIS — K2270 Barrett's esophagus without dysplasia: Secondary | ICD-10-CM | POA: Diagnosis not present

## 2016-10-13 DIAGNOSIS — I4892 Unspecified atrial flutter: Secondary | ICD-10-CM | POA: Diagnosis not present

## 2016-10-13 DIAGNOSIS — K2950 Unspecified chronic gastritis without bleeding: Secondary | ICD-10-CM | POA: Insufficient documentation

## 2016-10-13 DIAGNOSIS — I441 Atrioventricular block, second degree: Secondary | ICD-10-CM | POA: Diagnosis not present

## 2016-10-13 DIAGNOSIS — D123 Benign neoplasm of transverse colon: Secondary | ICD-10-CM | POA: Diagnosis not present

## 2016-10-13 DIAGNOSIS — Z820 Family history of epilepsy and other diseases of the nervous system: Secondary | ICD-10-CM | POA: Diagnosis not present

## 2016-10-13 DIAGNOSIS — F17210 Nicotine dependence, cigarettes, uncomplicated: Secondary | ICD-10-CM | POA: Insufficient documentation

## 2016-10-13 DIAGNOSIS — I10 Essential (primary) hypertension: Secondary | ICD-10-CM | POA: Insufficient documentation

## 2016-10-13 DIAGNOSIS — K3189 Other diseases of stomach and duodenum: Secondary | ICD-10-CM | POA: Diagnosis not present

## 2016-10-13 DIAGNOSIS — Q2733 Arteriovenous malformation of digestive system vessel: Secondary | ICD-10-CM | POA: Diagnosis not present

## 2016-10-13 DIAGNOSIS — I313 Pericardial effusion (noninflammatory): Secondary | ICD-10-CM | POA: Insufficient documentation

## 2016-10-13 DIAGNOSIS — R911 Solitary pulmonary nodule: Secondary | ICD-10-CM | POA: Diagnosis not present

## 2016-10-13 HISTORY — PX: COLONOSCOPY: SHX5424

## 2016-10-13 HISTORY — PX: ESOPHAGOGASTRODUODENOSCOPY: SHX5428

## 2016-10-13 HISTORY — PX: BIOPSY: SHX5522

## 2016-10-13 HISTORY — PX: POLYPECTOMY: SHX5525

## 2016-10-13 SURGERY — EGD (ESOPHAGOGASTRODUODENOSCOPY)
Anesthesia: Moderate Sedation

## 2016-10-13 MED ORDER — BIS SUBCIT-METRONID-TETRACYC 140-125-125 MG PO CAPS: 3.0000 | ORAL_CAPSULE | Freq: Three times a day (TID) | ORAL | 0 refills | Status: DC

## 2016-10-13 MED ORDER — MIDAZOLAM HCL 5 MG/5ML IJ SOLN
INTRAMUSCULAR | Status: AC
  Filled 2016-10-13: qty 10

## 2016-10-13 MED ORDER — PROMETHAZINE HCL 25 MG/ML IJ SOLN
INTRAMUSCULAR | Status: DC | PRN
  Administered 2016-10-13: 12.5 mg via INTRAVENOUS

## 2016-10-13 MED ORDER — SODIUM CHLORIDE 0.9 % IV SOLN
INTRAVENOUS | Status: DC
  Administered 2016-10-13: 1000 mL via INTRAVENOUS

## 2016-10-13 MED ORDER — SODIUM CHLORIDE 0.9% FLUSH
INTRAVENOUS | Status: AC
  Filled 2016-10-13: qty 10

## 2016-10-13 MED ORDER — MEPERIDINE HCL 50 MG/ML IJ SOLN
INTRAMUSCULAR | Status: AC
  Filled 2016-10-13: qty 1

## 2016-10-13 MED ORDER — MIDAZOLAM HCL 5 MG/5ML IJ SOLN
INTRAMUSCULAR | Status: AC
  Filled 2016-10-13: qty 5

## 2016-10-13 MED ORDER — BUTAMBEN-TETRACAINE-BENZOCAINE 2-2-14 % EX AERO
INHALATION_SPRAY | CUTANEOUS | Status: DC | PRN
  Administered 2016-10-13: 2 via TOPICAL

## 2016-10-13 MED ORDER — PROMETHAZINE HCL 25 MG/ML IJ SOLN
INTRAMUSCULAR | Status: AC
  Filled 2016-10-13: qty 1

## 2016-10-13 MED ORDER — MEPERIDINE HCL 50 MG/ML IJ SOLN
INTRAMUSCULAR | Status: DC | PRN
  Administered 2016-10-13 (×2): 25 mg via INTRAVENOUS

## 2016-10-13 MED ORDER — STERILE WATER FOR IRRIGATION IR SOLN
Status: DC | PRN
  Administered 2016-10-13: 2.5 mL

## 2016-10-13 MED ORDER — MIDAZOLAM HCL 5 MG/5ML IJ SOLN
INTRAMUSCULAR | Status: DC | PRN
  Administered 2016-10-13 (×7): 2 mg via INTRAVENOUS

## 2016-10-13 NOTE — Op Note (Signed)
[facility] Patient Name: Shaun May Procedure Date: 10/13/2016 12:04 PM MRN: [id number] Date of Birth: 1951-06-08 Attending MD: Hildred Laser , MD CSN: [id number] Age: 65 Admit Type: Outpatient Procedure:                Upper GI endoscopy Indications:              Follow-up of gastric ulcer Providers:                Hildred Laser, MD, Lurline Del, RN, Charlyne Petrin                            RN, RN, Randa Spike, Technician Referring MD:             Delphina Cahill, MD Medicines:                Cetacaine spray, Meperidine 50 mg IV, Midazolam 12                            mg IV Complications:            No immediate complications. Estimated Blood Loss:     Estimated blood loss: none. Procedure:                Pre-Anesthesia Assessment:                           - Prior to the procedure, a History and Physical                            was performed, and patient medications and                            allergies were reviewed. The patient's tolerance of                            previous anesthesia was also reviewed. The risks                            and benefits of the procedure and the sedation                            options and risks were discussed with the patient.                            All questions were answered, and informed consent                            was obtained. Prior Anticoagulants: The patient                            last took Xarelto (rivaroxaban) 3 days prior to the                            procedure. ASA Grade Assessment: III - A patient  with severe systemic disease. After reviewing the                            risks and benefits, the patient was deemed in                            satisfactory condition to undergo the procedure.                           After obtaining informed consent, the endoscope was                            passed under direct vision. Throughout the   procedure, the patient's blood pressure, pulse, and                            oxygen saturations were monitored continuously. The                            [id number] ([id number]) scope was introduced through the                            mouth, and advanced to the second part of duodenum.                            The upper GI endoscopy was accomplished without                            difficulty. The patient tolerated the procedure                            well. Scope In: 1:12:43 PM Scope Out: 1:25:51 PM Total Procedure Duration: 0 hours 13 minutes 8 seconds  Findings:      The proximal esophagus and mid esophagus were normal.      Three tongues of salmon-colored mucosa were present from 42 to 44 cm. No       other visible abnormalities were present. The maximum longitudinal       extent of these esophageal mucosal changes was 20 cm in length. Biopsies       were taken with a cold forceps for histology.      The Z-line was irregular and was found 44 cm from the incisors.      A few small sessile polyps were found in the gastric fundus.      Mild inflammation characterized by erythema and granularity was found in       the gastric antrum.      A healed ulcer was found in the gastric antrum.      A single angiodysplastic lesion with stigmata of recent bleeding was       found in the duodenal bulb. Coagulation for hemostasis using argon       plasma was successful.      The second portion of the duodenum was normal. Impression:               - Normal proximal esophagus and mid esophagus.                           -  Salmon-colored mucosa consistent with                            short-segment Barrett's esophagus. Biopsied.                           - Z-line irregular, 44 cm from the incisors.                           - A few gastric polyps.                           - Chronic gastritis.                           - Scar in the gastric antrum.                           - A single  recently bleeding angiodysplastic lesion                            in the duodenum. Treated with argon plasma                            coagulation ([facility]).                           - Normal second portion of the duodenum. Moderate Sedation:      Moderate (conscious) sedation was administered by the endoscopy nurse       and supervised by the endoscopist. The following parameters were       monitored: oxygen saturation, heart rate, blood pressure, CO2       capnography and response to care. Total physician intraservice time was       20 minutes. Recommendation:           - Patient has a contact number available for                            emergencies. The signs and symptoms of potential                            delayed complications were discussed with the                            patient. Return to normal activities tomorrow.                            Written discharge instructions were provided to the                            patient.                           - Resume previous diet today.                           - Continue  present medications.                           - Resume Xarelto (rivaroxaban) at prior dose in 3                            days.                           - Pylera 3 capsules by mouth 4 times a da for 10                            days(H pylori infection has not been treated yet).                           - See the other procedure note for documentation of                            additional recommendations. Procedure Code(s):        --- Professional ---                           [phone], Esophagogastroduodenoscopy, flexible,                            transoral; with biopsy, single or multiple                           99152, Moderate sedation services provided by the                            same physician or other qualified health care                            professional performing the diagnostic or                            therapeutic  service that the sedation supports,                            requiring the presence of an independent trained                            observer to assist in the monitoring of the                            patient's level of consciousness and physiological                            status; initial 15 minutes of intraservice time,                            patient age 66 years or older Diagnosis Code(s):        --- Professional ---  K22.8, Other specified diseases of esophagus                           K31.7, Polyp of stomach and duodenum                           K29.50, Unspecified chronic gastritis without                            bleeding                           K31.89, Other diseases of stomach and duodenum                           K31.811, Angiodysplasia of stomach and duodenum                            with bleeding                           K25.9, Gastric ulcer, unspecified as acute or                            chronic, without hemorrhage or perforation CPT copyright 2016 [employer]. All rights reserved. The codes documented in this report are preliminary and upon coder review may  be revised to meet current compliance requirements. Hildred Laser, MD Hildred Laser, MD 10/13/2016 2:19:47 PM This report has been signed electronically. Number of Addenda: 0

## 2016-10-13 NOTE — Discharge Instructions (Signed)
Resume Xeralto on 10/16/2016. Resume other medications and diet as before. Pylera 3 capsules by mouth after each meal and at bedtime for 10 days. Decrease pantoprazole to 40 mg by mouth daily before breakfast after 2 weeks. No driving for 24 hours. Physician will call with biopsy results. Office visit in 3 months.   Gastrointestinal Endoscopy, Care After Refer to this sheet in the next few weeks. These instructions provide you with information on caring for yourself after your procedure. Your caregiver may also give you more specific instructions. Your treatment has been planned according to current medical practices, but problems sometimes occur. Call your caregiver if you have any problems or questions after your procedure. HOME CARE INSTRUCTIONS  If you were given medicine to help you relax (sedative), do not drive, operate machinery, or sign important documents for 24 hours.  Avoid alcohol and hot or warm beverages for the first 24 hours after the procedure.  Only take over-the-counter or prescription medicines for pain, discomfort, or fever as directed by your caregiver. You may resume taking your normal medicines unless your caregiver tells you otherwise. Ask your caregiver when you may resume taking medicines that may cause bleeding, such as aspirin, clopidogrel, or warfarin.  You may return to your normal diet and activities on the day after your procedure, or as directed by your caregiver. Walking may help to reduce any bloated feeling in your abdomen.  Drink enough fluids to keep your urine clear or pale yellow.  You may gargle with salt water if you have a sore throat. SEEK IMMEDIATE MEDICAL CARE IF:  You have severe nausea or vomiting.  You have severe abdominal pain, abdominal cramps that last longer than 6 hours, or abdominal swelling (distention).  You have severe shoulder or back pain.  You have trouble swallowing.  You have shortness of breath, your breathing is  shallow, or you are breathing faster than normal.  You have a fever or a rapid heartbeat.  You vomit blood or material that looks like coffee grounds.  You have bloody, black, or tarry stools. MAKE SURE YOU:  Understand these instructions.  Will watch your condition.  Will get help right away if you are not doing well or get worse.   This information is not intended to replace advice given to you by your health care provider. Make sure you discuss any questions you have with your health care provider.   Document Released: 07/06/2004 Document Revised: 12/13/2014 Document Reviewed: 02/22/2012 Elsevier Interactive Patient Education 2016 [employer].  Hemorrhoids Hemorrhoids are swollen veins around the rectum or anus. There are two types of hemorrhoids:   Internal hemorrhoids. These occur in the veins just inside the rectum. They may poke through to the outside and become irritated and painful.  External hemorrhoids. These occur in the veins outside the anus and can be felt as a painful swelling or hard lump near the anus. CAUSES  Pregnancy.   Obesity.   Constipation or diarrhea.   Straining to have a bowel movement.   Sitting for long periods on the toilet.  Heavy lifting or other activity that caused you to strain.  Anal intercourse. SYMPTOMS   Pain.   Anal itching or irritation.   Rectal bleeding.   Fecal leakage.   Anal swelling.   One or more lumps around the anus.  DIAGNOSIS  Your caregiver may be able to diagnose hemorrhoids by visual examination. Other examinations or tests that may be performed include:   Examination of the  rectal area with a gloved hand (digital rectal exam).   Examination of anal canal using a small tube (scope).   A blood test if you have lost a significant amount of blood.  A test to look inside the colon (sigmoidoscopy or colonoscopy). TREATMENT Most hemorrhoids can be treated at home. However, if symptoms do  not seem to be getting better or if you have a lot of rectal bleeding, your caregiver may perform a procedure to help make the hemorrhoids get smaller or remove them completely. Possible treatments include:   Placing a rubber band at the base of the hemorrhoid to cut off the circulation (rubber band ligation).   Injecting a chemical to shrink the hemorrhoid (sclerotherapy).   Using a tool to burn the hemorrhoid (infrared light therapy).   Surgically removing the hemorrhoid (hemorrhoidectomy).   Stapling the hemorrhoid to block blood flow to the tissue (hemorrhoid stapling).  HOME CARE INSTRUCTIONS   Eat foods with fiber, such as whole grains, beans, nuts, fruits, and vegetables. Ask your doctor about taking products with added fiber in them (fibersupplements).  Increase fluid intake. Drink enough water and fluids to keep your urine clear or pale yellow.   Exercise regularly.   Go to the bathroom when you have the urge to have a bowel movement. Do not wait.   Avoid straining to have bowel movements.   Keep the anal area dry and clean. Use wet toilet paper or moist towelettes after a bowel movement.   Medicated creams and suppositories may be used or applied as directed.   Only take over-the-counter or prescription medicines as directed by your caregiver.   Take warm sitz baths for 15-20 minutes, 3-4 times a day to ease pain and discomfort.   Place ice packs on the hemorrhoids if they are tender and swollen. Using ice packs between sitz baths may be helpful.   Put ice in a plastic bag.   Place a towel between your skin and the bag.   Leave the ice on for 15-20 minutes, 3-4 times a day.   Do not use a donut-shaped pillow or sit on the toilet for long periods. This increases blood pooling and pain.  SEEK MEDICAL CARE IF:  You have increasing pain and swelling that is not controlled by treatment or medicine.  You have uncontrolled bleeding.  You have  difficulty or you are unable to have a bowel movement.  You have pain or inflammation outside the area of the hemorrhoids. MAKE SURE YOU:  Understand these instructions.  Will watch your condition.  Will get help right away if you are not doing well or get worse.   This information is not intended to replace advice given to you by your health care provider. Make sure you discuss any questions you have with your health care provider.   Document Released: 11/19/2000 Document Revised: 11/08/2012 Document Reviewed: 09/26/2012 Elsevier Interactive Patient Education 2016 Elsevier Inc.   Colon Polyps Polyps are lumps of extra tissue growing inside the body. Polyps can grow in the large intestine (colon). Most colon polyps are noncancerous (benign). However, some colon polyps can become cancerous over time. Polyps that are larger than a pea may be harmful. To be safe, caregivers remove and test all polyps. CAUSES  Polyps form when mutations in the genes cause your cells to grow and divide even though no more tissue is needed. RISK FACTORS There are a number of risk factors that can increase your chances of getting colon polyps.  They include:  Being older than 50 years.  Family history of colon polyps or colon cancer.  Long-term colon diseases, such as colitis or Crohn disease.  Being overweight.  Smoking.  Being inactive.  Drinking too much alcohol. SYMPTOMS  Most small polyps do not cause symptoms. If symptoms are present, they may include:  Blood in the stool. The stool may look dark red or black.  Constipation or diarrhea that lasts longer than 1 week. DIAGNOSIS People often do not know they have polyps until their caregiver finds them during a regular checkup. Your caregiver can use 4 tests to check for polyps:  Digital rectal exam. The caregiver wears gloves and feels inside the rectum. This test would find polyps only in the rectum.  Barium enema. The caregiver puts a  liquid called barium into your rectum before taking X-rays of your colon. Barium makes your colon look white. Polyps are dark, so they are easy to see in the X-ray pictures.  Sigmoidoscopy. A thin, flexible tube (sigmoidoscope) is placed into your rectum. The sigmoidoscope has a light and tiny camera in it. The caregiver uses the sigmoidoscope to look at the last third of your colon.  Colonoscopy. This test is like sigmoidoscopy, but the caregiver looks at the entire colon. This is the most common method for finding and removing polyps. TREATMENT  Any polyps will be removed during a sigmoidoscopy or colonoscopy. The polyps are then tested for cancer. PREVENTION  To help lower your risk of getting more colon polyps:  Eat plenty of fruits and vegetables. Avoid eating fatty foods.  Do not smoke.  Avoid drinking alcohol.  Exercise every day.  Lose weight if recommended by your caregiver.  Eat plenty of calcium and folate. Foods that are rich in calcium include milk, cheese, and broccoli. Foods that are rich in folate include chickpeas, kidney beans, and spinach. HOME CARE INSTRUCTIONS Keep all follow-up appointments as directed by your caregiver. You may need periodic exams to check for polyps. SEEK MEDICAL CARE IF: You notice bleeding during a bowel movement.   This information is not intended to replace advice given to you by your health care provider. Make sure you discuss any questions you have with your health care provider.   Document Released: 08/18/2004 Document Revised: 12/13/2014 Document Reviewed: 02/01/2012 Elsevier Interactive Patient Education [employer].

## 2016-10-13 NOTE — H&P (Signed)
Shaun May is an 65 y.o. male.   Chief Complaint: Patient is here for EGD and colonoscopy. HPI: Patient is 65 year old Caucasian male with multiple medical problems was hospitalized in June with history of melena and anemia and found to have gastric ulcers. He has been treated with PPI feels much better. He is not having anymore abdominal pain or melena. She was scheduled for diagnostic EGD along with screening colonoscopy but he was seen in emergency room 6 days ago with hematochezia. He states having bleeding intermittently with the bowel movements for the last 4 weeks. He was felt to have hemorrhoids. His hemoglobin was 14 g. His H pylori was positive but is unclear if he's been treated. Last EGD on 05/27/2016 revealed gastric ulcers as well as patches of salmon colored mucosa at distal esophagus consistent with Barrett's. Biopsy was postponed because he was anticoagulated. History is negative for CRC. He has been off Xarelto for 3 days.  Past Medical History:  Diagnosis Date  . Atrial flutter ([place])   . Hemochromatosis   . Hypertension   . Lung nodule   . Mobitz type 2 second degree heart block    PPM MDT 10/28/15 Dr. Lovena Le  . Pericardial effusion     Past Surgical History:  Procedure Laterality Date  . CARDIAC CATHETERIZATION N/A 03/31/2016   Procedure: Left Heart Cath and Coronary Angiography;  Surgeon: Wellington Hampshire, MD;  Location: [facility];  Service: Cardiovascular;  Laterality: N/A;  . COLONOSCOPY    . EP IMPLANTABLE DEVICE N/A 10/28/2015   Procedure: Pacemaker Implant;  Surgeon: Evans Lance, MD;  Location: [facility];  Service: Cardiovascular;  Laterality: N/A;  . EP IMPLANTABLE DEVICE N/A 11/07/2015   Procedure: Pocket Revision;  Surgeon: Evans Lance, MD;  Location: [facility];  Service: Cardiovascular;  Laterality: N/A;  . ESOPHAGOGASTRODUODENOSCOPY N/A 05/27/2016   Procedure: ESOPHAGOGASTRODUODENOSCOPY (EGD);  Surgeon: Rogene Houston, MD;   [facility];  Service: Endoscopy;  Laterality: N/A;  . TONSILLECTOMY      Family History  Problem Relation Age of Onset  . Diabetes Other   . Dementia Mother    Social History:  reports that he has been smoking E-cigarettes and Cigarettes.  He has a 112.00 pack-year smoking history. He has never used smokeless tobacco. He reports that he drinks about 12.6 oz of alcohol per week . He reports that he does not use drugs.  Allergies: No Known Allergies  Medications Prior to Admission  Medication Sig Dispense Refill  . albuterol (PROVENTIL) (2.5 MG/3ML) 0.083% nebulizer solution Take 3 mLs (2.5 mg total) by nebulization every 4 (four) hours as needed for wheezing or shortness of breath. 75 mL 12  . atorvastatin (LIPITOR) 40 MG tablet TAKE ONE TABLET BY MOUTH ONCE DAILY AT 6PM. 30 tablet 6  . carvedilol (COREG) 25 MG tablet Take 12.5 mg by mouth 2 (two) times daily with a meal.   11  . ferrous sulfate 325 (65 FE) MG tablet Take 325 mg by mouth daily with breakfast.    . furosemide (LASIX) 40 MG tablet TAKE ONE TABLET BY MOUTH ONCE DAILY. 30 tablet 11  . LORazepam (ATIVAN) 2 MG tablet Take 1 tablet by mouth 4 (four) times daily.     Marland Kitchen oxyCODONE-acetaminophen (PERCOCET/ROXICET) 5-325 MG tablet Take 1-2 tablets by mouth every 6 (six) hours as needed. (Patient taking differently: Take 1 tablet by mouth 4 (four) times daily. ) 15 tablet 0  .  pantoprazole (PROTONIX) 40 MG tablet Take 1 tablet (40 mg total) by mouth 2 (two) times daily before a meal. 60 tablet 2  . potassium chloride SA (K-DUR,KLOR-CON) 20 MEQ tablet Take 1 tablet by mouth daily.    Marland Kitchen PROAIR HFA 108 (90 Base) MCG/ACT inhaler Inhale 2 puffs into the lungs every 4 (four) hours as needed for wheezing or shortness of breath. 1 Inhaler 0  . rivaroxaban (XARELTO) 20 MG TABS tablet Take 20 mg by mouth daily with supper.    Marland Kitchen rOPINIRole (REQUIP) 0.25 MG tablet Take 1-3 tablets by mouth at bedtime.   1    No results found for this  or any previous visit (from the past 48 hour(s)). No results found.  ROS  Blood pressure (!) 188/79, pulse 69, temperature 98.6 F (37 C), temperature source Oral, resp. rate 14, height '5\' 10"'$  (1.778 m), weight 170 lb (77.1 kg), SpO2 97 %. Physical Exam  Constitutional: He appears well-developed and well-nourished.  HENT:  Mouth/Throat: Oropharynx is clear and moist.  Eyes: Conjunctivae are normal. No scleral icterus.  Neck: No thyromegaly present.  Cardiovascular: Normal rate, regular rhythm and normal heart sounds.   No murmur heard. Respiratory: Effort normal and breath sounds normal.  GI: Soft. He exhibits no distension and no mass. There is no tenderness.  Musculoskeletal: He exhibits no edema.  Lymphadenopathy:    He has no cervical adenopathy.  Neurological: He is alert.  Skin: Skin is warm and dry.     Assessment/Plan History of gastric ulcers. Diagnostic EGD and screening colonoscopy.  Hildred Laser, MD 10/13/2016, 12:58 PM

## 2016-10-13 NOTE — Op Note (Signed)
[facility] Patient Name: Shaun May Procedure Date: 10/13/2016 1:30 PM MRN: [id number] Date of Birth: [birth year] Attending MD: Hildred Laser , MD CSN: [id number] Age: 65 Admit Type: Outpatient Procedure:                Colonoscopy Indications:              Screening for colorectal malignant neoplasm Providers:                Hildred Laser, MD, Lurline Del, RN, Charlyne Petrin                            RN, RN, Randa Spike, Technician Referring MD:             Delphina Cahill, MD Medicines:                Midazolam 2 mg IV, Promethazine 45.0 mg IV Complications:            No immediate complications. Estimated Blood Loss:     Estimated blood loss: none. Procedure:                Pre-Anesthesia Assessment:                           - Prior to the procedure, a History and Physical                            was performed, and patient medications and                            allergies were reviewed. The patient's tolerance of                            previous anesthesia was also reviewed. The risks                            and benefits of the procedure and the sedation                            options and risks were discussed with the patient.                            All questions were answered, and informed consent                            was obtained. Prior Anticoagulants: The patient                            last took Xarelto (rivaroxaban) 3 days prior to the                            procedure. ASA Grade Assessment: III - A patient                            with severe systemic disease. After reviewing the  risks and benefits, the patient was deemed in                            satisfactory condition to undergo the procedure.                           After obtaining informed consent, the colonoscope                            was passed under direct vision. Throughout the                            procedure, the patient's blood  pressure, pulse, and                            oxygen saturations were monitored continuously. The                            EC-[id number] ([id number]) scope was introduced through                            the anus and advanced to the the cecum, identified                            by appendiceal orifice and ileocecal valve. The                            colonoscopy was performed without difficulty. The                            patient tolerated the procedure well. The quality                            of the bowel preparation was good. The ileocecal                            valve, appendiceal orifice, and rectum were                            photographed. Scope In: 1:34:38 PM Scope Out: 1:57:54 PM Scope Withdrawal Time: 0 hours 15 minutes 32 seconds  Total Procedure Duration: 0 hours 23 minutes 16 seconds  Findings:      A 7 mm polyp was found in the transverse colon. The polyp was sessile.       The polyp was removed with a hot snare. Resection and retrieval were       complete. To prevent bleeding post-intervention, one hemostatic clip was       successfully placed (MR conditional). There was no bleeding at the end       of the procedure.      External hemorrhoids were found during retroflexion. The hemorrhoids       were small.      The exam was otherwise normal throughout the examined colon. Impression:               - One  7 mm polyp in the transverse colon, removed                            with a hot snare. Resected and retrieved. Clip (MR                            conditional) was placed.                           - External hemorrhoids. Moderate Sedation:      Moderate (conscious) sedation was administered by the endoscopy nurse       and supervised by the endoscopist. The following parameters were       monitored: oxygen saturation, heart rate, blood pressure, CO2       capnography and response to care. Total physician intraservice time was       33  minutes. Recommendation:           - Patient has a contact number available for                            emergencies. The signs and symptoms of potential                            delayed complications were discussed with the                            patient. Return to normal activities tomorrow.                            Written discharge instructions were provided to the                            patient.                           - Resume previous diet today.                           - Continue present medications.                           - Resume Xarelto (rivaroxaban) at prior dose in 3                            days.                           - Await pathology results.                           - Repeat colonoscopy for surveillance based on                            pathology results. Procedure Code(s):        --- Professional ---  [phone], Colonoscopy, flexible; with removal of                            tumor(s), polyp(s), or other lesion(s) by snare                            technique                           99152, Moderate sedation services provided by the                            same physician or other qualified health care                            professional performing the diagnostic or                            therapeutic service that the sedation supports,                            requiring the presence of an independent trained                            observer to assist in the monitoring of the                            patient's level of consciousness and physiological                            status; initial 15 minutes of intraservice time,                            patient age 74 years or older                           [phone], Moderate sedation services; each additional                            15 minutes intraservice time Diagnosis Code(s):        --- Professional ---                           Z12.11, Encounter  for screening for malignant                            neoplasm of colon                           D12.3, Benign neoplasm of transverse colon (hepatic                            flexure or splenic flexure)                           K64.4, Residual hemorrhoidal  skin tags CPT copyright 2016 American Medical Association. All rights reserved. The codes documented in this report are preliminary and upon coder review may  be revised to meet current compliance requirements. Hildred Laser, MD Hildred Laser, MD 10/13/2016 2:23:21 PM This report has been signed electronically. Number of Addenda: 0

## 2016-10-14 ENCOUNTER — Telehealth: Payer: Self-pay | Admitting: Cardiovascular Disease

## 2016-10-14 ENCOUNTER — Telehealth (INDEPENDENT_AMBULATORY_CARE_PROVIDER_SITE_OTHER): Payer: Self-pay | Admitting: Internal Medicine

## 2016-10-14 NOTE — Telephone Encounter (Signed)
A lower dose is not therapeutic for his diagnosis. He should see GI for the rectal bleeding. He may have some some internal hemorrhoids that need to be evaluated. If his rectal bleeding is serious, bright red blood that does not stop or dark tarry stools he should call back. Needs to see PCP or GI please

## 2016-10-14 NOTE — Telephone Encounter (Signed)
Will forward to K Lawrence NP 

## 2016-10-14 NOTE — Telephone Encounter (Signed)
Pt would like to know if he can lower the dosage of his rivaroxaban (XARELTO) 20 MG TABS tablet [[id number]]  --pt has been having some rectal bleeding-

## 2016-10-14 NOTE — Telephone Encounter (Signed)
Patient presented to the office.  He stated that the had a procedure yesterday and that Dr. Laural Golden called in an antibiotic to [facility] for him.  He wasn't sure of the name of it.  He stated that [facility] stated that insurance will not pay for it.  I called [employer], the medication is Pylera and per [employer] will not pay for the Pylera.  The patient stated that [facility] told him that our office would be giving him some samples.  Please advise.  [phone]

## 2016-10-14 NOTE — Telephone Encounter (Signed)
Samples of Pylera to given to the patient. Per Dr.Rehman's instruction.

## 2016-10-15 NOTE — Telephone Encounter (Signed)
Spoke withwife,husband in car,they will call pcp

## 2016-10-21 ENCOUNTER — Encounter (HOSPITAL_COMMUNITY): Payer: Self-pay | Admitting: Internal Medicine

## 2016-11-10 ENCOUNTER — Encounter: Payer: Self-pay | Admitting: Cardiovascular Disease

## 2016-11-10 ENCOUNTER — Ambulatory Visit (INDEPENDENT_AMBULATORY_CARE_PROVIDER_SITE_OTHER): Payer: BLUE CROSS/BLUE SHIELD | Admitting: Cardiovascular Disease

## 2016-11-10 VITALS — BP 130/66 | HR 61 | Ht 70.0 in | Wt 176.0 lb

## 2016-11-10 DIAGNOSIS — Z950 Presence of cardiac pacemaker: Secondary | ICD-10-CM

## 2016-11-10 DIAGNOSIS — Z716 Tobacco abuse counseling: Secondary | ICD-10-CM

## 2016-11-10 DIAGNOSIS — I10 Essential (primary) hypertension: Secondary | ICD-10-CM

## 2016-11-10 DIAGNOSIS — I429 Cardiomyopathy, unspecified: Secondary | ICD-10-CM | POA: Diagnosis not present

## 2016-11-10 DIAGNOSIS — I4892 Unspecified atrial flutter: Secondary | ICD-10-CM

## 2016-11-10 DIAGNOSIS — Z7189 Other specified counseling: Secondary | ICD-10-CM

## 2016-11-10 MED ORDER — VARENICLINE TARTRATE 0.5 MG X 11 & 1 MG X 42 PO MISC: ORAL | 0 refills | Status: DC

## 2016-11-10 NOTE — Progress Notes (Signed)
SUBJECTIVE: The patient presents for routine follow-up. He has a history of chronic systolic heart failure, nonischemic cardiomyopathy, pacemaker, COPD, alcohol and tobacco abuse, lung nodule, and pericardial effusion.  Most recent echocardiogram on 05/04/16 demonstrated normalization of LVEF to 60-65%, previously had been 35-40%. There was mild LVH, normal regional wall motion, severe left atrial dilatation, moderate right ventricular dilatation with mild to moderately reduced right ventricular systolic function, and a moderate circumferential pericardial effusion measuring 1.2 cm in diastole adjacent to the LV apex. There was no evidence of temporal not physiology and the fusion overall appeared smaller in size compared to study dated 04/12/16.  There was no evidence of obstructive coronary artery disease by coronary angiography 03/31/16.  He has taken himself off of Xarelto for over a week. He is very concerned about bleeding. We had a long discussion about the risks and benefits of being off of Xarelto and the risk of CVA.  Upper endoscopy in November 2017 showed a bleeding duodenal angiodysplastic lesion which was treated.  He wishes to try to quit smoking.   Review of Systems: As per "subjective", otherwise negative.  No Known Allergies  Current Outpatient Prescriptions  Medication Sig Dispense Refill  . albuterol (PROVENTIL) (2.5 MG/3ML) 0.083% nebulizer solution Take 3 mLs (2.5 mg total) by nebulization every 4 (four) hours as needed for wheezing or shortness of breath. 75 mL 12  . aspirin EC 81 MG tablet Take 81 mg by mouth daily.    Marland Kitchen atorvastatin (LIPITOR) 40 MG tablet TAKE ONE TABLET BY MOUTH ONCE DAILY AT 6PM. 30 tablet 6  . bismuth-metronidazole-tetracycline (PYLERA) 140-125-125 MG capsule Take 3 capsules by mouth 4 (four) times daily -  before meals and at bedtime. 120 capsule 0  . carvedilol (COREG) 25 MG tablet Take 12.5 mg by mouth 2 (two) times daily with a meal.    11  . ferrous sulfate 325 (65 FE) MG tablet Take 325 mg by mouth daily with breakfast.    . furosemide (LASIX) 40 MG tablet TAKE ONE TABLET BY MOUTH ONCE DAILY. 30 tablet 11  . LORazepam (ATIVAN) 2 MG tablet Take 1 tablet by mouth 4 (four) times daily.     . pantoprazole (PROTONIX) 40 MG tablet Take 1 tablet (40 mg total) by mouth 2 (two) times daily before a meal. 60 tablet 2  . potassium chloride SA (K-DUR,KLOR-CON) 20 MEQ tablet Take 1 tablet by mouth daily.    Marland Kitchen PROAIR HFA 108 (90 Base) MCG/ACT inhaler Inhale 2 puffs into the lungs every 4 (four) hours as needed for wheezing or shortness of breath. 1 Inhaler 0  . rOPINIRole (REQUIP) 0.25 MG tablet Take 1-3 tablets by mouth at bedtime.   1   No current facility-administered medications for this visit.     Past Medical History:  Diagnosis Date  . Atrial flutter ([place])   . Hemochromatosis   . Hypertension   . Lung nodule   . Mobitz type 2 second degree heart block    PPM MDT 10/28/15 Dr. Lovena Le  . Pericardial effusion     Past Surgical History:  Procedure Laterality Date  . BIOPSY  10/13/2016   Procedure: BIOPSY;  Surgeon: Rogene Houston, MD;  [facility];  Service: Endoscopy;;  esophageal  . CARDIAC CATHETERIZATION N/A 03/31/2016   Procedure: Left Heart Cath and Coronary Angiography;  Surgeon: Wellington Hampshire, MD;  Location: [facility];  Service: Cardiovascular;  Laterality: N/A;  . COLONOSCOPY    .  COLONOSCOPY N/A 10/13/2016   Procedure: COLONOSCOPY;  Surgeon: Rogene Houston, MD;  [facility];  Service: Endoscopy;  Laterality: N/A;  . EP IMPLANTABLE DEVICE N/A 10/28/2015   Procedure: Pacemaker Implant;  Surgeon: Evans Lance, MD;  Location: [facility];  Service: Cardiovascular;  Laterality: N/A;  . EP IMPLANTABLE DEVICE N/A 11/07/2015   Procedure: Pocket Revision;  Surgeon: Evans Lance, MD;  Location: [facility];  Service: Cardiovascular;  Laterality: N/A;  .  ESOPHAGOGASTRODUODENOSCOPY N/A 05/27/2016   Procedure: ESOPHAGOGASTRODUODENOSCOPY (EGD);  Surgeon: Rogene Houston, MD;  [facility];  Service: Endoscopy;  Laterality: N/A;  . ESOPHAGOGASTRODUODENOSCOPY N/A 10/13/2016   Procedure: ESOPHAGOGASTRODUODENOSCOPY (EGD);  Surgeon: Rogene Houston, MD;  [facility];  Service: Endoscopy;  Laterality: N/A;  1:30 - moved to 11/8 @ 1:00 - Ann notified pt  . POLYPECTOMY  10/13/2016   Procedure: POLYPECTOMY;  Surgeon: Rogene Houston, MD;  [facility];  Service: Endoscopy;;  transverse colon polypectomy  . TONSILLECTOMY      Social History   Social History  . Marital status: Divorced    Spouse name: N/A  . Number of children: N/A  . Years of education: N/A   Occupational History  . Not on file.   Social History Main Topics  . Smoking status: Current Every Day Smoker    Packs/day: 2.00    Years: 56.00    Types: E-cigarettes, Cigarettes  . Smokeless tobacco: Never Used     Comment: e cigarettes  . Alcohol use 12.6 oz/week    21 Shots of liquor per week     Comment: nightly  . Drug use: No  . Sexual activity: Not on file   Other Topics Concern  . Not on file   Social History Narrative  . No narrative on file     Vitals:   11/10/16 0818  BP: 130/66  Pulse: 61  SpO2: 93%  Weight: 176 lb (79.8 kg)  Height: 5' 10"  (1.778 m)    PHYSICAL EXAM General: NAD HEENT: Normal. Neck: No JVD, no thyromegaly. Lungs: Diminished throughout, faint exp wheezes. CV: Distant tones. Regular rate and rhythm, normal S1/S2, no S3/S4, no murmur. No pretibial or periankle edema.     Abdomen: Soft, nontender, no distention.  Neurologic: Alert and oriented.  Psych: Normal affect. Skin: Normal. Musculoskeletal: No gross deformities.    ECG: Most recent ECG reviewed.      ASSESSMENT AND PLAN: 1. Pericardial effusion: Trivial in size 06/1016.  2. Cardiomyopathy/chronic systolic heart failure: EF 45% 06/14/16.  Continue Coreg and Lasix. Euvolemic.  3. Pacemaker: Followed by Dr. Lovena Le.  4. Paroxysmal atrial flutter: On Coreg. Wishes to stay off Xarelto. See discussion re: risks/benefits above.  5. Tobacco abuse: Will prescribe Chantix starter kit.  Dispo: fu 1 yr  Kate Sable, M.D., F.A.C.C.

## 2016-11-10 NOTE — Patient Instructions (Signed)
Medication Instructions:  START CHANTIX   Labwork: NONE  Testing/Procedures: NONE  Follow-Up: Your physician wants you to follow-up in: 1 YEAR.  You will receive a reminder letter in the mail two months in advance. If you don't receive a letter, please call our office to schedule the follow-up appointment.   Any Other Special Instructions Will Be Listed Below (If Applicable).     If you need a refill on your cardiac medications before your next appointment, please call your pharmacy.

## 2016-11-11 ENCOUNTER — Other Ambulatory Visit (INDEPENDENT_AMBULATORY_CARE_PROVIDER_SITE_OTHER): Payer: Self-pay | Admitting: Internal Medicine

## 2016-11-11 DIAGNOSIS — K219 Gastro-esophageal reflux disease without esophagitis: Secondary | ICD-10-CM

## 2017-01-25 ENCOUNTER — Encounter (INDEPENDENT_AMBULATORY_CARE_PROVIDER_SITE_OTHER): Payer: Self-pay | Admitting: Internal Medicine

## 2017-01-25 ENCOUNTER — Ambulatory Visit (INDEPENDENT_AMBULATORY_CARE_PROVIDER_SITE_OTHER): Payer: Medicare Other | Admitting: Internal Medicine

## 2017-01-25 VITALS — BP 140/90 | HR 63 | Temp 98.4°F | Resp 18 | Ht 70.0 in | Wt 180.9 lb

## 2017-01-25 DIAGNOSIS — Z8719 Personal history of other diseases of the digestive system: Secondary | ICD-10-CM | POA: Diagnosis not present

## 2017-01-25 DIAGNOSIS — K2270 Barrett's esophagus without dysplasia: Secondary | ICD-10-CM | POA: Diagnosis not present

## 2017-01-25 DIAGNOSIS — K219 Gastro-esophageal reflux disease without esophagitis: Secondary | ICD-10-CM | POA: Diagnosis not present

## 2017-01-25 MED ORDER — PANTOPRAZOLE SODIUM 40 MG PO TBEC: 40.0000 mg | DELAYED_RELEASE_TABLET | Freq: Every day | ORAL | 3 refills | Status: DC

## 2017-01-25 NOTE — Progress Notes (Signed)
Presenting complaint;  Follow-up for GERD and GI bleed.  Database and Subjective:  [age] year old Caucasian male who has history of gastric ulcer diagnosed last year when he presented with melena. He underwent follow-up EGD in November 2017 and gastric ulcer completely healed. He was also confirmed to have short segment Barrett's esophagus. He was treated for H. pylori gastritis. On his last EGD was also found to have duodenal AVM with active bleeding and was ablated with APC. He had single adenoma removed on colonoscopy. Patient went back on anticoagulant about 3 or 4 days after his polypectomy and noted fresh blood per rectum. He decided to discontinue xarelto. He has no complaints. He needs refill on his pantoprazole. He has not had any more episodes of rectal bleeding or melena. He has had intermittent heartburn since he came off pantoprazole. He has good appetite. He is trying to quit cigarette smoking. He is now down to 12 cigarettes per day. He is not sure if he wants to go back on anticoagulant but will not take xarelto.   Current Medications: Outpatient Encounter Prescriptions as of 01/25/2017  Medication Sig  . albuterol (PROVENTIL) (2.5 MG/3ML) 0.083% nebulizer solution Take 3 mLs (2.5 mg total) by nebulization every 4 (four) hours as needed for wheezing or shortness of breath.  Marland Kitchen atorvastatin (LIPITOR) 40 MG tablet TAKE ONE TABLET BY MOUTH ONCE DAILY AT 6PM.  . carvedilol (COREG) 25 MG tablet Take 12.5 mg by mouth 2 (two) times daily with a meal.   . furosemide (LASIX) 40 MG tablet TAKE ONE TABLET BY MOUTH ONCE DAILY.  Marland Kitchen LORazepam (ATIVAN) 2 MG tablet Take 1 tablet by mouth 4 (four) times daily.   . potassium chloride SA (K-DUR,KLOR-CON) 20 MEQ tablet Take 1 tablet by mouth daily.  Marland Kitchen rOPINIRole (REQUIP) 0.25 MG tablet Take 1-3 tablets by mouth at bedtime.   . [DISCONTINUED] aspirin EC 81 MG tablet Take 81 mg by mouth daily.  . [DISCONTINUED] bismuth-metronidazole-tetracycline  (PYLERA) 140-125-125 MG capsule Take 3 capsules by mouth 4 (four) times daily -  before meals and at bedtime. (Patient not taking: Reported on 01/25/2017)  . [DISCONTINUED] ferrous sulfate 325 (65 FE) MG tablet Take 325 mg by mouth daily with breakfast.  . [DISCONTINUED] pantoprazole (PROTONIX) 40 MG tablet TAKE 1 TABLET BY MOUTH TWICE DAILY BEFORE MEALS. (Patient not taking: Reported on 01/25/2017)  . [DISCONTINUED] PROAIR HFA 108 (90 Base) MCG/ACT inhaler Inhale 2 puffs into the lungs every 4 (four) hours as needed for wheezing or shortness of breath. (Patient not taking: Reported on 01/25/2017)  . [DISCONTINUED] varenicline (CHANTIX STARTING MONTH PAK) 0.5 MG X 11 & 1 MG X 42 tablet Take one 0.5 mg tablet by mouth once daily for 3 days, then increase to one 0.5 mg tablet twice daily for 4 days, then increase to one 1 mg tablet twice daily. (Patient not taking: Reported on 01/25/2017)   No facility-administered encounter medications on file as of 01/25/2017.      Objective: Blood pressure 140/90, pulse 63, temperature 98.4 F (36.9 C), temperature source Oral, resp. rate 18, height '5\' 10"'$  (1.778 m), weight 180 lb 14.4 oz (82.1 kg). Patient is alert and in no acute distress. Conjunctiva is pink. Sclera is nonicteric Oropharyngeal mucosa is normal. No neck masses or thyromegaly noted. Cardiac exam with regular rhythm normal S1 and S2. No murmur or gallop noted. Lungs are clear to auscultation. Abdomen is full but soft and nontender without organomegaly or masses.  No LE edema or  clubbing noted.  Labs/studies Results: H&H was 14 and 42.1 on 10/07/2016.    Assessment:  #1. GERD complicated by short segment Barrett's esophagus. Patient needs to be back on PPI. #2. History of GI bleed. Initially he was found to have gastric ulcer last year in follow-up exam reveals complete healing. He may have had insignificant post polypectomy bleed when he went back on anticoagulant. If his cardiologist  recommends anticoagulant I would urge that he try another agent. Will also discuss this with his PCP Dr. Edwyna Ready call. #3. Colonic adenoma noted on colonoscopy of November 2017. Next colonoscopy in 5 years.   Plan:  New prescription given for pantoprazole 40 mg by mouth every morning. He has 90 day supply with 3 refills. Hemoccult 1. He will have CBC on his next visit with Dr. Wende Neighbors. Patient will call 8 experiences melena or rectal bleeding. Office visit in one year.

## 2017-01-25 NOTE — Patient Instructions (Signed)
Hemoccults 1. Please ask Dr. Nevada Crane to do CBC on your next visit. Call if you have rectal bleeding or tarry stool.

## 2017-01-28 ENCOUNTER — Telehealth (INDEPENDENT_AMBULATORY_CARE_PROVIDER_SITE_OTHER): Payer: Self-pay | Admitting: *Deleted

## 2017-01-28 DIAGNOSIS — Z8711 Personal history of peptic ulcer disease: Secondary | ICD-10-CM | POA: Diagnosis not present

## 2017-01-28 DIAGNOSIS — K921 Melena: Secondary | ICD-10-CM | POA: Diagnosis not present

## 2017-01-28 NOTE — Telephone Encounter (Signed)
   Diagnosis:    Result(s)   Card 1:Negative:          Completed by: Thomas Hoff ,LPN   HEMOCCULT SENSA DEVELOPER: QMG#:[id number]   EXPIRATION DATE: 2020/05   HEMOCCULT SENSA CARD:  WUG#:[id number]   EXPIRATION DATE: 03/20   CARD CONTROL RESULTS:  POSITIVE:Positive  NEGATIVE: Negative    ADDITIONAL COMMENTS: Patient was called and given his result.

## 2017-01-31 ENCOUNTER — Ambulatory Visit (INDEPENDENT_AMBULATORY_CARE_PROVIDER_SITE_OTHER): Payer: Medicare Other | Admitting: Internal Medicine

## 2017-01-31 ENCOUNTER — Encounter: Payer: Self-pay | Admitting: Internal Medicine

## 2017-01-31 VITALS — BP 167/87 | HR 62 | Ht 70.0 in | Wt 183.0 lb

## 2017-01-31 DIAGNOSIS — I441 Atrioventricular block, second degree: Secondary | ICD-10-CM | POA: Diagnosis not present

## 2017-01-31 DIAGNOSIS — Z950 Presence of cardiac pacemaker: Secondary | ICD-10-CM

## 2017-01-31 DIAGNOSIS — I4892 Unspecified atrial flutter: Secondary | ICD-10-CM | POA: Diagnosis not present

## 2017-01-31 LAB — CUP PACEART INCLINIC DEVICE CHECK
Battery Impedance: 115 Ohm
Battery Remaining Longevity: 135 mo
Battery Voltage: 2.79 V
Brady Statistic AP VS Percent: 1 %
Date Time Interrogation Session: 20180226124913
Implantable Lead Implant Date: 20161122
Implantable Lead Location: 753859
Lead Channel Impedance Value: 542 Ohm
Lead Channel Impedance Value: 584 Ohm
Lead Channel Pacing Threshold Pulse Width: 0.4 ms
Lead Channel Pacing Threshold Pulse Width: 0.4 ms
Lead Channel Setting Pacing Amplitude: 2 V
Lead Channel Setting Pacing Amplitude: 2.5 V
Lead Channel Setting Pacing Pulse Width: 0.4 ms
Lead Channel Setting Sensing Sensitivity: 4 mV
MDC IDC LEAD IMPLANT DT: 20161122
MDC IDC LEAD LOCATION: 753860
MDC IDC MSMT LEADCHNL RA PACING THRESHOLD AMPLITUDE: 0.75 V
MDC IDC MSMT LEADCHNL RA SENSING INTR AMPL: 2.8 mV
MDC IDC MSMT LEADCHNL RV PACING THRESHOLD AMPLITUDE: 0.5 V
MDC IDC PG IMPLANT DT: 20161122
MDC IDC STAT BRADY AP VP PERCENT: 19 %
MDC IDC STAT BRADY AS VP PERCENT: 76 %
MDC IDC STAT BRADY AS VS PERCENT: 3 %

## 2017-01-31 NOTE — Progress Notes (Signed)
HPI Shaun May returns today for PPM followup. He is a 66 yo man with CHB, s/p PPM insertion, who then developed a pocket hematoma and underwent pocket revision. He has developed atrial fibrillation and was placed on anti-coagulation but then developed a GI bleed and required a 5 unit blood transfusion. He has had no additional GI bleeding since stopping his Xarelto. He is minimally symptomatic from his atrial fib. He is still smoking cigarettes. He has dyspnea with exertion.  Allergies  Allergen Reactions  . Xarelto [Rivaroxaban] Other (See Comments)    Caused bleeding.      Current Outpatient Prescriptions  Medication Sig Dispense Refill  . albuterol (PROVENTIL) (2.5 MG/3ML) 0.083% nebulizer solution Take 3 mLs (2.5 mg total) by nebulization every 4 (four) hours as needed for wheezing or shortness of breath. 75 mL 12  . atorvastatin (LIPITOR) 40 MG tablet TAKE ONE TABLET BY MOUTH ONCE DAILY AT 6PM. 30 tablet 6  . carvedilol (COREG) 25 MG tablet Take 12.5 mg by mouth 2 (two) times daily with a meal.   11  . furosemide (LASIX) 40 MG tablet TAKE ONE TABLET BY MOUTH ONCE DAILY. 30 tablet 11  . LORazepam (ATIVAN) 2 MG tablet Take 1 tablet by mouth 4 (four) times daily.     . pantoprazole (PROTONIX) 40 MG tablet Take 1 tablet (40 mg total) by mouth daily before breakfast. 90 tablet 3  . potassium chloride SA (K-DUR,KLOR-CON) 20 MEQ tablet Take 40 mEq by mouth daily.     Marland Kitchen rOPINIRole (REQUIP) 0.25 MG tablet Take 1-3 tablets by mouth at bedtime.   1   No current facility-administered medications for this visit.      Past Medical History:  Diagnosis Date  . Atrial flutter ([place])   . Hemochromatosis   . Hypertension   . Lung nodule   . Mobitz type 2 second degree heart block    PPM MDT 10/28/15 Dr. Lovena Le  . Pericardial effusion     ROS:   All systems reviewed and negative except as noted in the HPI.   Past Surgical History:  Procedure Laterality Date  . BIOPSY  10/13/2016   Procedure: BIOPSY;  Surgeon: Rogene Houston, MD;  [facility];  Service: Endoscopy;;  esophageal  . CARDIAC CATHETERIZATION N/A 03/31/2016   Procedure: Left Heart Cath and Coronary Angiography;  Surgeon: Wellington Hampshire, MD;  Location: [facility];  Service: Cardiovascular;  Laterality: N/A;  . COLONOSCOPY    . COLONOSCOPY N/A 10/13/2016   Procedure: COLONOSCOPY;  Surgeon: Rogene Houston, MD;  [facility];  Service: Endoscopy;  Laterality: N/A;  . EP IMPLANTABLE DEVICE N/A 10/28/2015   Procedure: Pacemaker Implant;  Surgeon: Evans Lance, MD;  Location: [facility];  Service: Cardiovascular;  Laterality: N/A;  . EP IMPLANTABLE DEVICE N/A 11/07/2015   Procedure: Pocket Revision;  Surgeon: Evans Lance, MD;  Location: [facility];  Service: Cardiovascular;  Laterality: N/A;  . ESOPHAGOGASTRODUODENOSCOPY N/A 05/27/2016   Procedure: ESOPHAGOGASTRODUODENOSCOPY (EGD);  Surgeon: Rogene Houston, MD;  [facility];  Service: Endoscopy;  Laterality: N/A;  . ESOPHAGOGASTRODUODENOSCOPY N/A 10/13/2016   Procedure: ESOPHAGOGASTRODUODENOSCOPY (EGD);  Surgeon: Rogene Houston, MD;  [facility];  Service: Endoscopy;  Laterality: N/A;  1:30 - moved to 11/8 @ 1:00 - Ann notified pt  . POLYPECTOMY  10/13/2016   Procedure: POLYPECTOMY;  Surgeon: Rogene Houston, MD;  [facility];  Service: Endoscopy;;  transverse colon polypectomy  . TONSILLECTOMY       Family History  Problem Relation Age of Onset  . Diabetes Other   . Dementia Mother      Social History   Social History  . Marital status: Divorced    Spouse name: N/A  . Number of children: N/A  . Years of education: N/A   Occupational History  . Not on file.   Social History Main Topics  . Smoking status: Current Every Day Smoker    Packs/day: 2.00    Years: 56.00    Types: E-cigarettes, Cigarettes  . Smokeless tobacco: Never Used     Comment: e cigarettes  .  Alcohol use 12.6 oz/week    21 Shots of liquor per week     Comment: nightly  . Drug use: No  . Sexual activity: Not on file   Other Topics Concern  . Not on file   Social History Narrative  . No narrative on file     BP (!) 167/87   Pulse 62   Ht '5\' 10"'$  (1.778 m)   Wt 183 lb (83 kg)   SpO2 94%   BMI 26.26 kg/m   Physical Exam:  Well appearing 66 yo man, NAD HEENT: Unremarkable Neck:  6 cm JVD, no thyromegally Lymphatics:  No adenopathy Back:  No CVA tenderness Lungs:  Clear with rare wheezes, well healed PPM incision HEART:  Regular rate rhythm, no murmurs, no rubs, no clicks Abd:  soft, positive bowel sounds, no organomegally, no rebound, no guarding Ext:  2 plus pulses, no edema, no cyanosis, no clubbing Skin:  No rashes no nodules Neuro:  CN II through XII intact, motor grossly intact  DEVICE  Normal device function.  See PaceArt for details.   Assess/Plan: 1. CHB - he is s/p PPM doing well. He did have some conduction today and AV search hysteresis was turned on. 2. HTN - His blood pressure is well controlled. Will follow. 3. PPM - his Medtronic DDD PM is working normally. Will recheck in several months 4. Coags - we discussed his stroke risk and bleeding risk on OAC's. Long term he is not an anti-coagulation candidate but I think he might be a Watchman candidate. Will refer.   Mikle Bosworth.D.

## 2017-01-31 NOTE — Patient Instructions (Signed)
Your physician wants you to follow-up in: 1 with Dr.Taylor. You will receive a reminder letter in the mail two months in advance. If you don't receive a letter, please call our office to schedule the follow-up appointment.  You have been referred to [facility]   Your physician recommends that you continue on your current medications as directed. Please refer to the Current Medication list given to you today.  If you need a refill on your cardiac medications before your next appointment, please call your pharmacy.  Thank you for choosing [facility]!

## 2017-01-31 NOTE — Telephone Encounter (Signed)
Hemoccult negative.

## 2017-02-15 DIAGNOSIS — F411 Generalized anxiety disorder: Secondary | ICD-10-CM | POA: Diagnosis not present

## 2017-02-15 DIAGNOSIS — J060 Acute laryngopharyngitis: Secondary | ICD-10-CM | POA: Diagnosis not present

## 2017-02-15 DIAGNOSIS — G894 Chronic pain syndrome: Secondary | ICD-10-CM | POA: Diagnosis not present

## 2017-02-15 DIAGNOSIS — D509 Iron deficiency anemia, unspecified: Secondary | ICD-10-CM | POA: Diagnosis not present

## 2017-02-15 DIAGNOSIS — D519 Vitamin B12 deficiency anemia, unspecified: Secondary | ICD-10-CM | POA: Diagnosis not present

## 2017-02-15 DIAGNOSIS — G4700 Insomnia, unspecified: Secondary | ICD-10-CM | POA: Diagnosis not present

## 2017-02-15 DIAGNOSIS — Z6825 Body mass index (BMI) 25.0-25.9, adult: Secondary | ICD-10-CM | POA: Diagnosis not present

## 2017-02-16 IMAGING — PT NM PET TUM IMG INITIAL (PI) SKULL BASE T - THIGH
8 series · 25 of 25 positions shown · non-contrast
Comparison: None.

CLINICAL DATA: Initial treatment strategy for pulmonary nodule .

EXAM:
NUCLEAR MEDICINE PET SKULL BASE TO THIGH
TECHNIQUE: 9.8 mCi F-18 FDG was injected intravenously. Full-ring PET imaging
was performed from the skull base to thigh after the radiotracer. CT
data was obtained and used for attenuation correction and anatomic
localization.
FASTING BLOOD GLUCOSE:  Value: 107 mg/dl

[Series 3: pet sk_thigh ac · axial · 5.0mm · 4.07mm/px · z∈[-793,+75]mm · 4 of 218 slices shown]
[im 1/218]
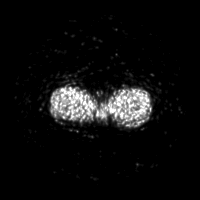
[im 73/218]
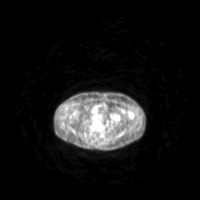
[im 145/218]
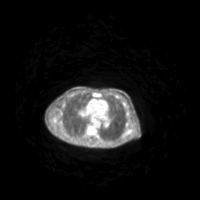
[im 218/218]
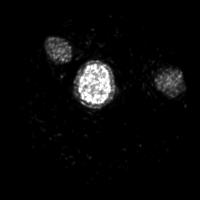

[Series 4: ct sk_thigh 5.0 b31f · axial · 5.0mm · 0.95mm/px · z∈[-793,+75]mm · 5 of 218 slices shown]
[im 1/218]
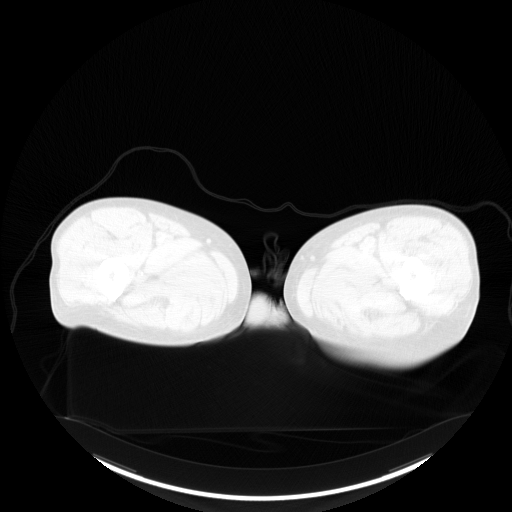
[im 55/218]
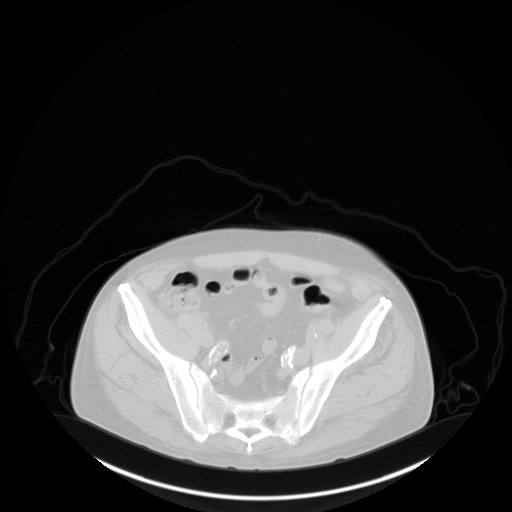
[im 109/218]
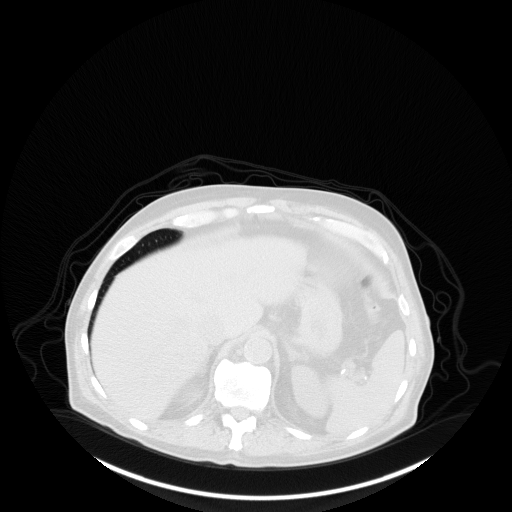
[im 163/218]
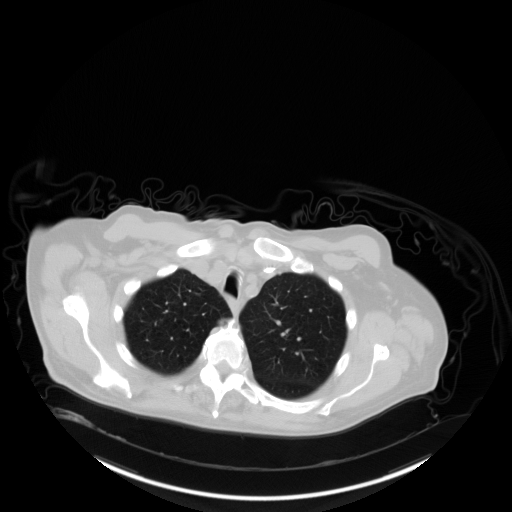
[im 218/218  brain]
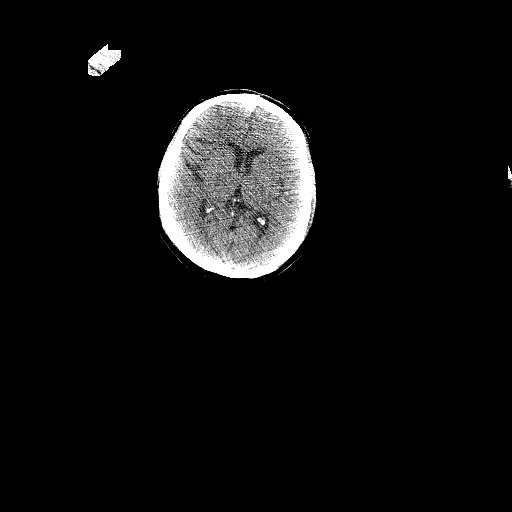

[Series 7: pet sk_thigh nac · axial · 5.0mm · 4.07mm/px · z∈[-793,+75]mm · 5 of 218 slices shown]
[im 1/218]
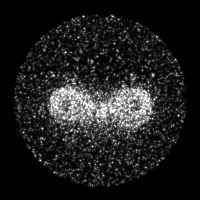
[im 55/218]
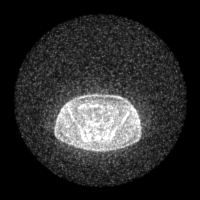
[im 109/218]
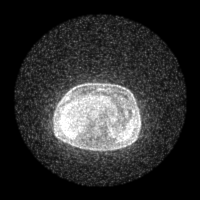
[im 163/218]
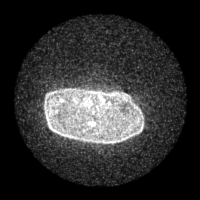
[im 218/218]
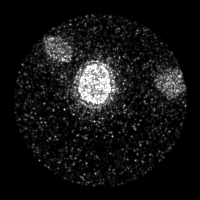

[Series 8: ct sk_thigh 5.0 b70f lung_bone · axial · 5.0mm · 0.77mm/px · z∈[-363,-75]mm · 2 of 73 slices shown]
[im 1/73  bone]
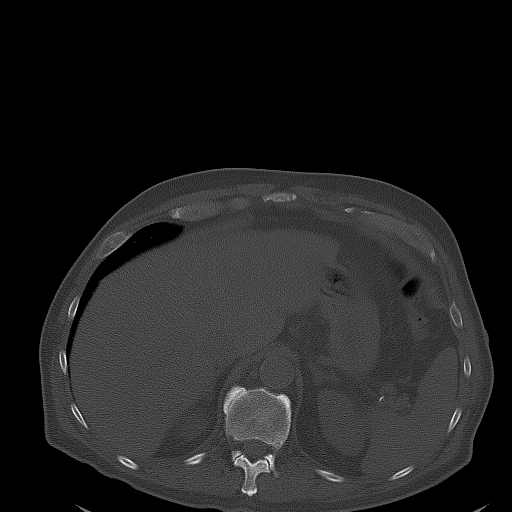
[im 73/73  bone]
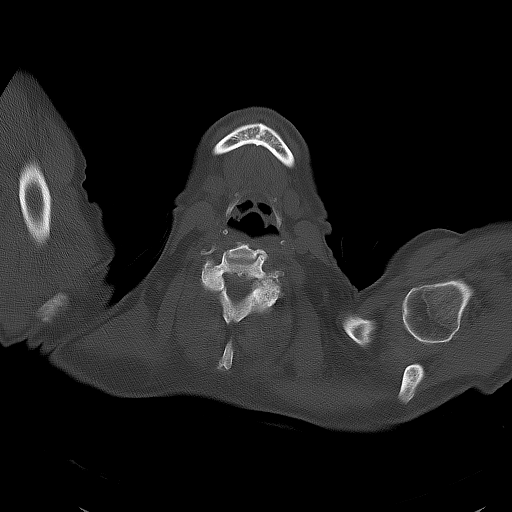

[Series 604: range-ct sk_thigh 5.0 (id)<alpha range> · 2 of 70 slices shown (1 of 2)]
[im 1/70]
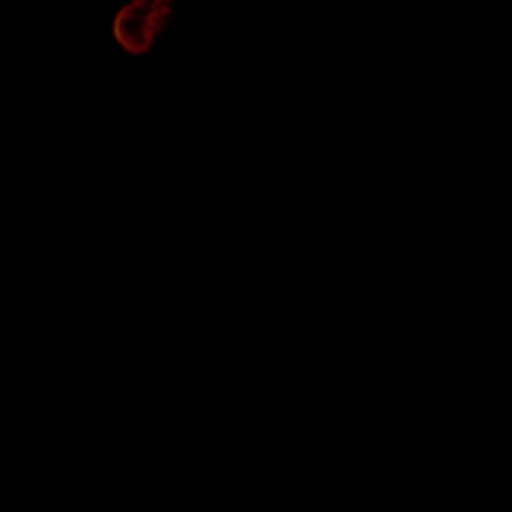
[im 70/70]
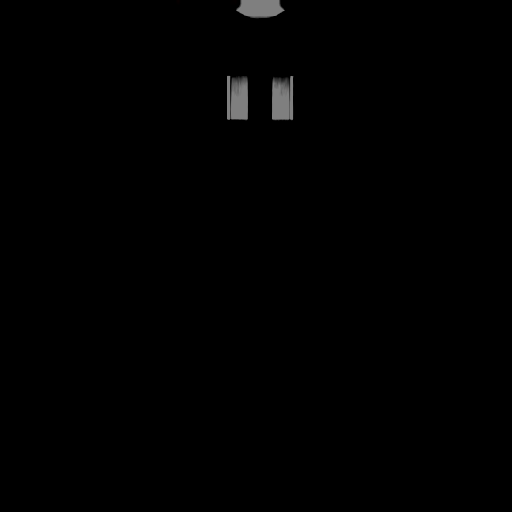

[Series 605: mip collection<mip range> · coronal · 1.80mm/px · 1 of 32 slices shown]
[im 1/32]
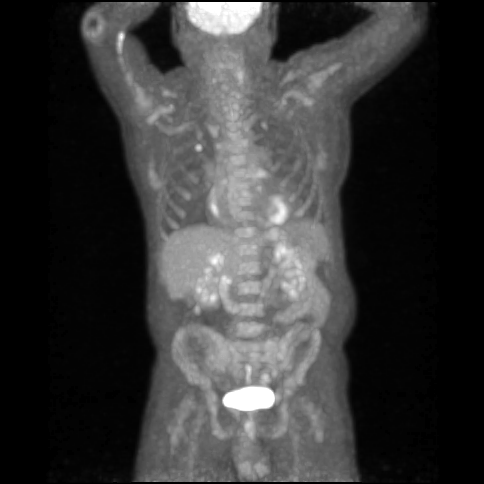

[Series 606: range-ct sk_thigh 5.0 (id)<alpha range> · 5 of 203 slices shown (2 of 2)]
[im 1/203]
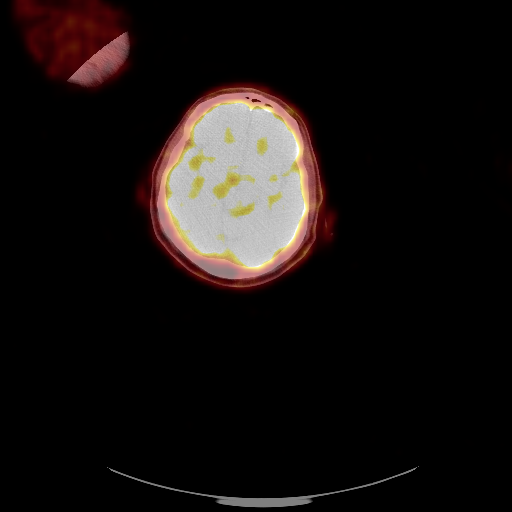
[im 51/203]
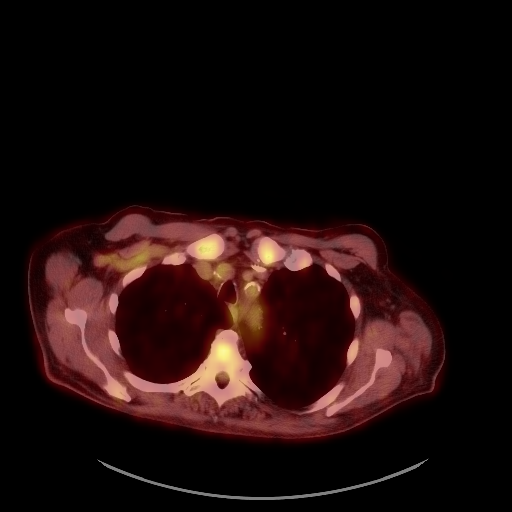
[im 102/203]
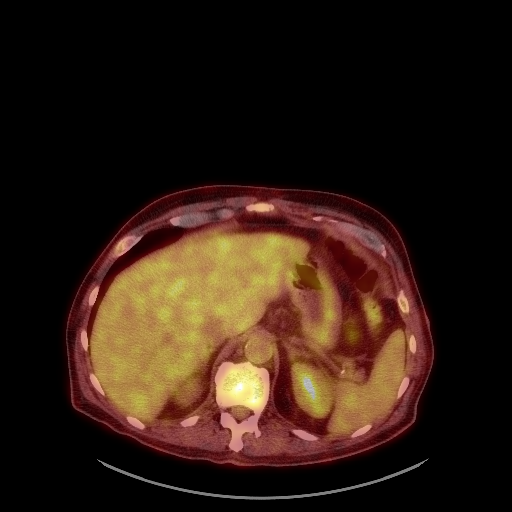
[im 152/203]
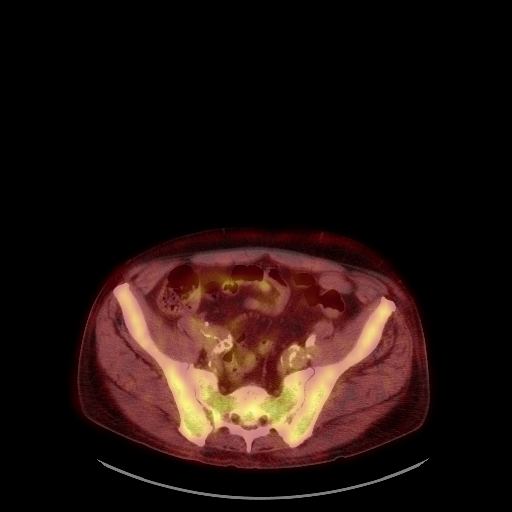
[im 203/203]
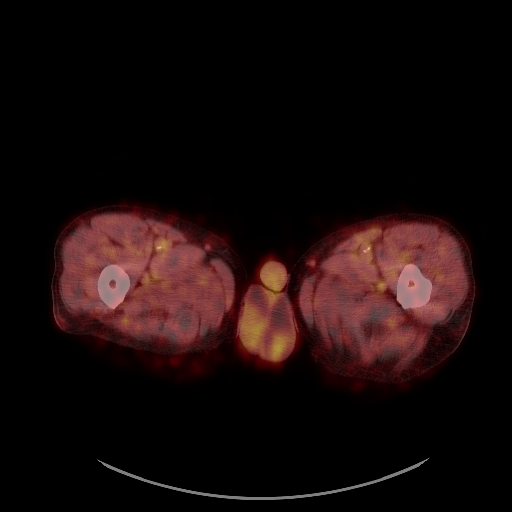

[Series 1034: results mm oncology reading · 0.87mm/px · 1 of 1 slices shown]
[im 1/1]
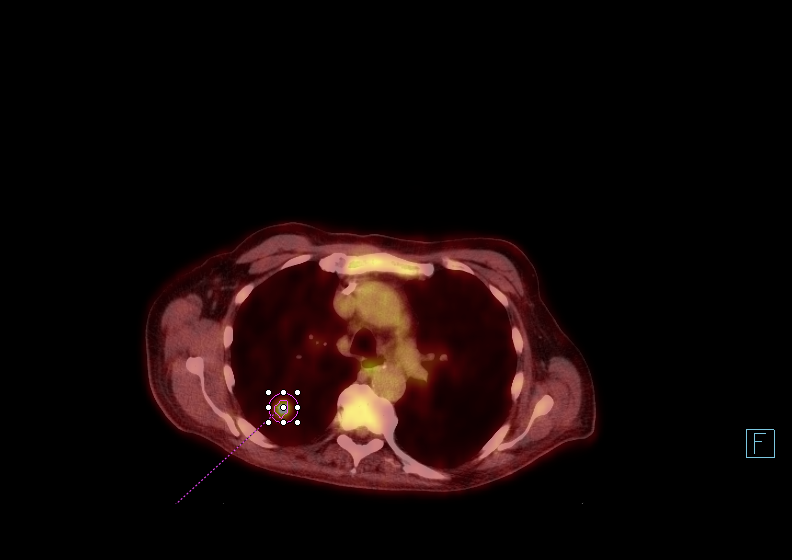

[25 of 25 positions shown; findings below may reference images not displayed]

FINDINGS: NECK

No hypermetabolic lymph nodes in the neck.

CHEST

14.5 mm right upper lobe pulmonary nodule adjacent to the major
fissure is hypermetabolic with SUV max of 6.7. This is consistent
with neoplasm. No enlarged or metabolically active mediastinal or
hilar lymph nodes are identified. No other pulmonary lesions are
seen. There are underlying emphysematous changes.

Moderate-sized pericardial effusion.

ABDOMEN/PELVIS

No abnormal hypermetabolic activity within the liver, pancreas,
adrenal glands, or spleen. No hypermetabolic lymph nodes in the
abdomen or pelvis.

Additional findings include advanced atherosclerotic calcifications
involving the aorta and iliac arteries. There is also a separate.
Right renal cyst.

Small amount free pelvic fluid of uncertain significance or
etiology.

SKELETON

Diffuse osseous uptake but no focal lesions.
IMPRESSION: 1. 14.5 mm right upper lobe pulmonary nodule is hypermetabolic and
consistent with neoplasm. No enlarged or metabolically active
mediastinal or hilar lymph nodes and no findings for metastatic
disease.
2. Moderate-sized pericardial effusion.
3. Diffuse osseous uptake without focal lesion.

## 2017-02-21 ENCOUNTER — Encounter: Payer: Self-pay | Admitting: Internal Medicine

## 2017-02-21 ENCOUNTER — Ambulatory Visit (INDEPENDENT_AMBULATORY_CARE_PROVIDER_SITE_OTHER): Payer: Medicare Other | Admitting: Internal Medicine

## 2017-02-21 ENCOUNTER — Other Ambulatory Visit: Payer: Self-pay

## 2017-02-21 VITALS — BP 158/86 | HR 61 | Ht 70.0 in | Wt 180.0 lb

## 2017-02-21 DIAGNOSIS — I4892 Unspecified atrial flutter: Secondary | ICD-10-CM

## 2017-02-21 DIAGNOSIS — I441 Atrioventricular block, second degree: Secondary | ICD-10-CM | POA: Diagnosis not present

## 2017-02-21 DIAGNOSIS — I10 Essential (primary) hypertension: Secondary | ICD-10-CM | POA: Diagnosis not present

## 2017-02-21 DIAGNOSIS — I5022 Chronic systolic (congestive) heart failure: Secondary | ICD-10-CM

## 2017-02-21 DIAGNOSIS — Z7189 Other specified counseling: Secondary | ICD-10-CM

## 2017-02-21 MED ORDER — APIXABAN 5 MG PO TABS: 5.0000 mg | ORAL_TABLET | Freq: Two times a day (BID) | ORAL | 0 refills | Status: DC

## 2017-02-21 NOTE — Progress Notes (Signed)
Watchman Consult Note   Date:  02/21/2017   ID:  [name], DOB 10/04/1951, MRN [id number]  PCP:  Wende Neighbors, MD  Primary Cardiologist: Bronson Ing Primary Electrophysiologist: Lovena Le Referring Physician: Lovena Le   CC: to discuss Watchman implant    History of Present Illness: Shaun May is a 66 y.o. male who presents today for evaluation of left atrial appendage occluder.  He has persistent atrial fibrillation as well as complete heart block s/p PPM, hypertension, NICM, and prior GI bleeding.  The patient has been evaluated by their referring physician and is felt to be a poor candidate for long term [place] due to prior GI bleeding requiring transfusion. At that time, he had a gastric ulcer which has since healed on subsequent evaluations, but he is still concerned about long term anticoagulation. He therefore presents today for Watchman evaluation.   Today, he denies symptoms of palpitations, chest pain, shortness of breath, orthopnea, PND, lower extremity edema, claudication, dizziness, presyncope, syncope, bleeding, or neurologic sequela. The patient is tolerating medications without difficulties and is otherwise without complaint today.    Past Medical History:  Diagnosis Date  . Hemochromatosis   . Hypertension   . Lung nodule   . Mobitz type 2 second degree heart block    PPM MDT 10/28/15 Dr. Lovena Le  . Pericardial effusion   . Persistent atrial fibrillation [facility])    Past Surgical History:  Procedure Laterality Date  . BIOPSY  10/13/2016   Procedure: BIOPSY;  Surgeon: Rogene Houston, MD;  [facility];  Service: Endoscopy;;  esophageal  . CARDIAC CATHETERIZATION N/A 03/31/2016   Procedure: Left Heart Cath and Coronary Angiography;  Surgeon: Wellington Hampshire, MD;  Location: [facility];  Service: Cardiovascular;  Laterality: N/A;  . COLONOSCOPY N/A 10/13/2016   Procedure: COLONOSCOPY;  Surgeon: Rogene Houston, MD;  [facility];  Service:  Endoscopy;  Laterality: N/A;  . EP IMPLANTABLE DEVICE N/A 10/28/2015   Procedure: Pacemaker Implant;  Surgeon: Evans Lance, MD;  Location: [facility];  Service: Cardiovascular;  Laterality: N/A;  . EP IMPLANTABLE DEVICE N/A 11/07/2015   Procedure: Pocket Revision;  Surgeon: Evans Lance, MD;  Location: [place] [facility];  Service: Cardiovascular;  Laterality: N/A;  . ESOPHAGOGASTRODUODENOSCOPY N/A 05/27/2016   Procedure: ESOPHAGOGASTRODUODENOSCOPY (EGD);  Surgeon: Rogene Houston, MD;  [facility];  Service: Endoscopy;  Laterality: N/A;  . ESOPHAGOGASTRODUODENOSCOPY N/A 10/13/2016   Procedure: ESOPHAGOGASTRODUODENOSCOPY (EGD);  Surgeon: Rogene Houston, MD;  [facility];  Service: Endoscopy;  Laterality: N/A;  1:30 - moved to 11/8 @ 1:00 - Ann notified pt  . POLYPECTOMY  10/13/2016   Procedure: POLYPECTOMY;  Surgeon: Rogene Houston, MD;  [facility];  Service: Endoscopy;;  transverse colon polypectomy  . TONSILLECTOMY       Current Outpatient Prescriptions  Medication Sig Dispense Refill  . albuterol (PROVENTIL) (2.5 MG/3ML) 0.083% nebulizer solution Take 3 mLs (2.5 mg total) by nebulization every 4 (four) hours as needed for wheezing or shortness of breath. 75 mL 12  . atorvastatin (LIPITOR) 40 MG tablet TAKE ONE TABLET BY MOUTH ONCE DAILY AT 6PM. 30 tablet 6  . benzonatate (TESSALON) 200 MG capsule Take 200 mg by mouth 2 (two) times daily.    . carvedilol (COREG) 25 MG tablet Take 12.5 mg by mouth 2 (two) times daily with a meal.   11  . furosemide (LASIX) 40 MG tablet  TAKE ONE TABLET BY MOUTH ONCE DAILY. 30 tablet 11  . guaiFENesin-dextromethorphan (ROBITUSSIN DM) 100-10 MG/5ML syrup Take 5 mLs by mouth every 4 (four) hours as needed for cough.    Marland Kitchen LORazepam (ATIVAN) 2 MG tablet Take 1 tablet by mouth 4 (four) times daily.     Marland Kitchen oxyCODONE-acetaminophen (PERCOCET/ROXICET) 5-325 MG tablet Take 1 tablet by mouth 4 (four) times daily as needed  (pain).    . pantoprazole (PROTONIX) 40 MG tablet Take 1 tablet (40 mg total) by mouth daily before breakfast. 90 tablet 3  . potassium chloride SA (K-DUR,KLOR-CON) 20 MEQ tablet Take 40 mEq by mouth daily.     Marland Kitchen rOPINIRole (REQUIP) 0.25 MG tablet Take 1-3 tablets by mouth at bedtime.   1  . apixaban (ELIQUIS) 5 MG TABS tablet Take 1 tablet (5 mg total) by mouth 2 (two) times daily. 60 tablet 0   No current facility-administered medications for this visit.     Allergies:   Xarelto [rivaroxaban]   Social History:  The patient  reports that he has been smoking E-cigarettes and Cigarettes.  He has a 112.00 pack-year smoking history. He has never used smokeless tobacco. He reports that he drinks about 12.6 oz of alcohol per week . He reports that he does not use drugs.   Family History:  The patient's family history includes Dementia in his mother; Diabetes in his other.    ROS:  Please see the history of present illness.   All other systems are reviewed and negative.    PHYSICAL EXAM: VS:  BP (!) 158/86   Pulse 61   Ht '5\' 10"'$  (1.778 m)   Wt 180 lb (81.6 kg)   SpO2 91%   BMI 25.83 kg/m  , BMI Body mass index is 25.83 kg/m. GEN: Well nourished, well developed, in no acute distress  HEENT: normal  Neck: no JVD, carotid bruits, or masses Cardiac: RRR; no murmurs, rubs, or gallops,no edema  Respiratory:  clear to auscultation bilaterally, normal work of breathing GI: soft, nontender, nondistended, + BS MS: no deformity or atrophy  Skin: warm and dry  Neuro:  Strength and sensation are intact Psych: euthymic mood, full affect  EKG:  EKG is not ordered today.   Recent Labs: 05/25/2016: TSH 0.755 05/26/2016: Magnesium 1.7 10/07/2016: ALT 37; B Natriuretic Peptide 88.0; BUN 16; Creatinine, Ser 1.13; Hemoglobin 14.0; Platelets 153; Potassium 3.8; Sodium 134    Lipid Panel  No results found for: CHOL, TRIG, HDL, CHOLHDL, VLDL, LDLCALC, LDLDIRECT   Wt Readings from Last 3  Encounters:  02/21/17 180 lb (81.6 kg)  01/31/17 183 lb (83 kg)  01/25/17 180 lb 14.4 oz (82.1 kg)      Other studies Reviewed: Additional studies/ records that were reviewed today include: Dr Lovena Le and GI office notes   ASSESSMENT AND PLAN:  1.  Persistent atrial fibrillation I have seen NAHUN KRONBERG is a 66 y.o. male in the office today who has been referred by Dr Lovena Le for a Watchman left atrial appendage closure device.  He has a history of persistent atrial fibrillation. This patients CHA2DS2-VASc Score and unadjusted Ischemic Stroke Rate (% per year) is equal to 3.2 % stroke rate/year from a score of 3. Unfortunately, He is not felt to be a long term Warfarin candidate secondary to prior GI bleeding requiring transfusion.  The patients chart has been reviewed and I along with their referring cardiologist feel that they would be a candidate for short term oral  anticoagulation.  Procedural risks for the Watchman implant have been reviewed with the patient including a 1% risk of stroke, 2% risk of perforation, 0.1% risk of device embolization.  Given the patient's poor candidacy for long-term oral anticoagulation, ability to tolerate short term oral anticoagulation, I have recommended the watchman left atrial appendage closure system.  We have discussed trial of Eliquis to see if he will tolerate post procedure anticoagulation.  He will start Eliquis '5mg'$  twice daily today and have BMET, CBC today followed by repeat labs in 2 weeks.  EP NP will follow up with patient at that time to further discuss Watchman/treatment options.   2.  Pericardial effusion Resolved by last echo  3.  Tobacco abuse Cessation advised  4.  HTN Stable No change required today  5.  NICM Euvolemic on exam Continue current therapy   6.  Complete heart block Normal device function today Pt is pacemaker dependent    Follow-up:  With EP NP by phone   Current medicines are reviewed at length with the  patient today.   The patient does not have concerns regarding his medicines.  The following changes were made today:  Start Eliquis '5mg'$  twice daily   Labs/ tests ordered today include: BMET, CBC Orders Placed This Encounter  Procedures  . Basic Metabolic Panel (BMET)  . CBC with Differential/Platelet  . Basic Metabolic Panel (BMET)  . CBC with Differential/Platelet   Today, I have spent 40  minutes with the patient discussing stroke risks and anticoagulation risks .  More than 50% of the visit time today was spent on this issue.    SignedThompson Grayer, MD 02/21/2017     [address] [place] [address] [phone] (office) [phone] (fax)

## 2017-02-21 NOTE — Patient Instructions (Addendum)
Medication Instructions: - Your physician has recommended you make the following change in your medication:  1) Start eliquis 5 mg - take one tablet by mouth twice daily  Labwork: - Your physician recommends that you have lab work today: BMP/ [place] recommends that you return for lab work in: 2 weeks in [place]- BMP/CBC  Procedures/Testing: - none ordered  Follow-Up: - pending  Any Additional Special Instructions Will Be Listed Below (If Applicable).     If you need a refill on your cardiac medications before your next appointment, please call your pharmacy.

## 2017-02-22 ENCOUNTER — Telehealth: Payer: Self-pay

## 2017-02-22 LAB — BASIC METABOLIC PANEL
BUN/Creatinine Ratio: 8 — ABNORMAL LOW (ref 10–24)
BUN: 8 mg/dL (ref 8–27)
CALCIUM: 8.7 mg/dL (ref 8.6–10.2)
CO2: 26 mmol/L (ref 18–29)
CREATININE: 0.97 mg/dL (ref 0.76–1.27)
Chloride: 92 mmol/L — ABNORMAL LOW (ref 96–106)
GFR, EST AFRICAN AMERICAN: 94 mL/min/{1.73_m2} (ref >59)
GFR, EST NON AFRICAN AMERICAN: 82 mL/min/{1.73_m2} (ref >59)
Glucose: 84 mg/dL (ref 65–99)
Potassium: 4 mmol/L (ref 3.5–5.2)
Sodium: 138 mmol/L (ref 134–144)

## 2017-02-22 LAB — CBC WITH DIFFERENTIAL/PLATELET
BASOS: 0 %
Basophils Absolute: 0 10*3/uL (ref 0.0–0.2)
EOS (ABSOLUTE): 0.2 10*3/uL (ref 0.0–0.4)
EOS: 3 %
HEMATOCRIT: 40.8 % (ref 37.5–51.0)
HEMOGLOBIN: 13.8 g/dL (ref 13.0–17.7)
IMMATURE GRANS (ABS): 0 10*3/uL (ref 0.0–0.1)
IMMATURE GRANULOCYTES: 0 %
LYMPHS: 25 %
Lymphocytes Absolute: 1.9 10*3/uL (ref 0.7–3.1)
MCH: 31.7 pg (ref 26.6–33.0)
MCHC: 33.8 g/dL (ref 31.5–35.7)
MCV: 94 fL (ref 79–97)
Monocytes Absolute: 0.6 10*3/uL (ref 0.1–0.9)
Monocytes: 7 %
NEUTROS ABS: 5 10*3/uL (ref 1.4–7.0)
NEUTROS PCT: 65 %
PLATELETS: 236 10*3/uL (ref 150–379)
RBC: 4.35 x10E6/uL (ref 4.14–5.80)
RDW: 14.9 % (ref 12.3–15.4)
WBC: 7.6 10*3/uL (ref 3.4–10.8)

## 2017-02-22 NOTE — Telephone Encounter (Signed)
Pt is aware and agreeable to results. He is optimistic about about starting Eliquis. He informed me he would keep [place] posted on how he is doing.

## 2017-02-28 ENCOUNTER — Telehealth: Payer: Self-pay | Admitting: Internal Medicine

## 2017-02-28 NOTE — Telephone Encounter (Signed)
New message   Pt c/o medication issue:  1. Name of Medication: apixaban (ELIQUIS) 5 MG TABS tablet  2. How are you currently taking this medication (dosage and times per day)? '5MG'$   3. Are you having a reaction (difficulty breathing--STAT)? Blood after/during urination  4. What is your medication issue? apixaban (ELIQUIS) 5 MG TABS tablet causing bleeding after urination and during urination

## 2017-02-28 NOTE — Telephone Encounter (Signed)
Spoke with pt, he states he noticed bleeding last night and this morning. He did not take his Eliquis this morning and stated that the bleeding had stopped by this afternoon. He states the quantity of blood is similar to when he previously had a GI bleed on Xarelto which required a transfusion. Advised pt to call his GI doctor for immediate workup of GI bleed. His CHADS2 score is 2, advised him he can skip his dose of Eliquis tonight but to resume his usual dose tomorrow. If GI cannot see him quickly, advised him to go to the ED, especially if his bleeding resumes. Pt verbalized understanding.  Will route to Dr Rayann Heman as an Jul[facility]ier and for any other recommendations since pt was seen recently for Watchman placement and was started on Eliquis to see if he would be able to tolerate short term anticoagulation post-procedure.

## 2017-02-28 NOTE — Telephone Encounter (Signed)
Received call from patient-patient reports 2 episodes of rectal bleeding last night and this AM.  Reports hx of GI bleeds on Xarelto and was started on eliquis last week 3/20.   Reports having a BM last night and had bright red blood on toilet paper but not noticed in the toilet.  Reports having a BM this morning and the "bowl was full of blood", reports bright red in color.  Reports having another BM today and there was no blood noted.  Reports he held AM dose of Eliquis as he is concerned due to his history and does not want that to happen again.  Denies dizziness, lightheadedness, bleeding elsewhere.    Per chart review-OV 3/20 with Dr. Rayann Heman for EP evaluation for possible Watchman.  Started on Eliquis, hx of GI bleeding on xarelto requiring transfusion.Durward Fortes I would route to pharmacy or MD for further advice.

## 2017-03-28 DIAGNOSIS — Z7901 Long term (current) use of anticoagulants: Secondary | ICD-10-CM | POA: Diagnosis not present

## 2017-03-28 DIAGNOSIS — J449 Chronic obstructive pulmonary disease, unspecified: Secondary | ICD-10-CM | POA: Diagnosis not present

## 2017-03-28 DIAGNOSIS — Z720 Tobacco use: Secondary | ICD-10-CM | POA: Diagnosis not present

## 2017-04-14 DIAGNOSIS — D509 Iron deficiency anemia, unspecified: Secondary | ICD-10-CM | POA: Diagnosis not present

## 2017-04-14 DIAGNOSIS — I10 Essential (primary) hypertension: Secondary | ICD-10-CM | POA: Diagnosis not present

## 2017-04-18 DIAGNOSIS — R001 Bradycardia, unspecified: Secondary | ICD-10-CM | POA: Diagnosis not present

## 2017-04-18 DIAGNOSIS — I5032 Chronic diastolic (congestive) heart failure: Secondary | ICD-10-CM | POA: Diagnosis not present

## 2017-04-18 DIAGNOSIS — I10 Essential (primary) hypertension: Secondary | ICD-10-CM | POA: Diagnosis not present

## 2017-04-18 DIAGNOSIS — D509 Iron deficiency anemia, unspecified: Secondary | ICD-10-CM | POA: Diagnosis not present

## 2017-04-18 DIAGNOSIS — G894 Chronic pain syndrome: Secondary | ICD-10-CM | POA: Diagnosis not present

## 2017-04-18 DIAGNOSIS — J449 Chronic obstructive pulmonary disease, unspecified: Secondary | ICD-10-CM | POA: Diagnosis not present

## 2017-04-18 DIAGNOSIS — F410 Panic disorder [episodic paroxysmal anxiety] without agoraphobia: Secondary | ICD-10-CM | POA: Diagnosis not present

## 2017-04-18 DIAGNOSIS — F411 Generalized anxiety disorder: Secondary | ICD-10-CM | POA: Diagnosis not present

## 2017-04-18 DIAGNOSIS — G2581 Restless legs syndrome: Secondary | ICD-10-CM | POA: Diagnosis not present

## 2017-04-19 ENCOUNTER — Other Ambulatory Visit (HOSPITAL_COMMUNITY): Payer: Self-pay | Admitting: Internal Medicine

## 2017-04-19 DIAGNOSIS — R911 Solitary pulmonary nodule: Secondary | ICD-10-CM

## 2017-05-03 ENCOUNTER — Ambulatory Visit (INDEPENDENT_AMBULATORY_CARE_PROVIDER_SITE_OTHER): Payer: Medicare Other | Admitting: *Deleted

## 2017-05-03 ENCOUNTER — Telehealth: Payer: Self-pay | Admitting: Cardiology

## 2017-05-03 DIAGNOSIS — I442 Atrioventricular block, complete: Secondary | ICD-10-CM

## 2017-05-03 NOTE — Progress Notes (Signed)
Remote pacemaker transmission.   

## 2017-05-03 NOTE — Telephone Encounter (Signed)
Spoke with pt and reminded pt of remote transmission that is due today. Pt verbalized understanding.   

## 2017-05-04 LAB — CUP PACEART REMOTE DEVICE CHECK
Battery Remaining Longevity: 131 mo
Brady Statistic AS VS Percent: 0 %
Date Time Interrogation Session: 20180529144557
Implantable Lead Implant Date: 20161122
Implantable Lead Implant Date: 20161122
Implantable Lead Location: 753859
Implantable Pulse Generator Implant Date: 20161122
Lead Channel Impedance Value: 556 Ohm
Lead Channel Pacing Threshold Amplitude: 0.625 V
Lead Channel Pacing Threshold Pulse Width: 0.4 ms
Lead Channel Setting Pacing Amplitude: 2 V
Lead Channel Setting Pacing Amplitude: 2.5 V
Lead Channel Setting Sensing Sensitivity: 4 mV
MDC IDC LEAD LOCATION: 753860
MDC IDC MSMT BATTERY IMPEDANCE: 115 Ohm
MDC IDC MSMT BATTERY VOLTAGE: 2.79 V
MDC IDC MSMT LEADCHNL RA IMPEDANCE VALUE: 534 Ohm
MDC IDC MSMT LEADCHNL RA PACING THRESHOLD AMPLITUDE: 0.625 V
MDC IDC MSMT LEADCHNL RA PACING THRESHOLD PULSEWIDTH: 0.4 ms
MDC IDC SET LEADCHNL RV PACING PULSEWIDTH: 0.4 ms
MDC IDC STAT BRADY AP VP PERCENT: 44 %
MDC IDC STAT BRADY AP VS PERCENT: 0 %
MDC IDC STAT BRADY AS VP PERCENT: 56 %

## 2017-05-06 ENCOUNTER — Encounter: Payer: Self-pay | Admitting: Cardiology

## 2017-05-06 ENCOUNTER — Ambulatory Visit (HOSPITAL_COMMUNITY)
Admission: RE | Admit: 2017-05-06 | Discharge: 2017-05-06 | Disposition: A | Discharge status: Home / Self Care | Payer: Medicare Other | Source: Ambulatory Visit | Attending: Internal Medicine | Admitting: Internal Medicine

## 2017-05-06 DIAGNOSIS — R911 Solitary pulmonary nodule: Secondary | ICD-10-CM | POA: Insufficient documentation

## 2017-05-06 DIAGNOSIS — M479 Spondylosis, unspecified: Secondary | ICD-10-CM | POA: Diagnosis not present

## 2017-05-06 DIAGNOSIS — N281 Cyst of kidney, acquired: Secondary | ICD-10-CM | POA: Diagnosis not present

## 2017-05-06 DIAGNOSIS — J432 Centrilobular emphysema: Secondary | ICD-10-CM | POA: Insufficient documentation

## 2017-05-06 DIAGNOSIS — I700 Atherosclerosis of aorta: Secondary | ICD-10-CM | POA: Insufficient documentation

## 2017-05-16 DIAGNOSIS — I10 Essential (primary) hypertension: Secondary | ICD-10-CM | POA: Diagnosis not present

## 2017-05-16 DIAGNOSIS — F411 Generalized anxiety disorder: Secondary | ICD-10-CM | POA: Diagnosis not present

## 2017-05-16 DIAGNOSIS — J449 Chronic obstructive pulmonary disease, unspecified: Secondary | ICD-10-CM | POA: Diagnosis not present

## 2017-05-16 DIAGNOSIS — R001 Bradycardia, unspecified: Secondary | ICD-10-CM | POA: Diagnosis not present

## 2017-05-16 DIAGNOSIS — G894 Chronic pain syndrome: Secondary | ICD-10-CM | POA: Diagnosis not present

## 2017-05-16 DIAGNOSIS — I5032 Chronic diastolic (congestive) heart failure: Secondary | ICD-10-CM | POA: Diagnosis not present

## 2017-05-16 DIAGNOSIS — I313 Pericardial effusion (noninflammatory): Secondary | ICD-10-CM | POA: Diagnosis not present

## 2017-05-16 DIAGNOSIS — F410 Panic disorder [episodic paroxysmal anxiety] without agoraphobia: Secondary | ICD-10-CM | POA: Diagnosis not present

## 2017-05-16 DIAGNOSIS — R938 Abnormal findings on diagnostic imaging of other specified body structures: Secondary | ICD-10-CM | POA: Diagnosis not present

## 2017-05-20 ENCOUNTER — Encounter: Payer: Self-pay | Admitting: Cardiology

## 2017-05-23 ENCOUNTER — Ambulatory Visit: Payer: Medicare Other | Admitting: Pulmonary Disease

## 2017-06-21 ENCOUNTER — Telehealth: Payer: Self-pay | Admitting: Internal Medicine

## 2017-06-21 DIAGNOSIS — G894 Chronic pain syndrome: Secondary | ICD-10-CM | POA: Diagnosis not present

## 2017-06-21 DIAGNOSIS — F410 Panic disorder [episodic paroxysmal anxiety] without agoraphobia: Secondary | ICD-10-CM | POA: Diagnosis not present

## 2017-06-21 DIAGNOSIS — J449 Chronic obstructive pulmonary disease, unspecified: Secondary | ICD-10-CM | POA: Diagnosis not present

## 2017-06-21 DIAGNOSIS — I10 Essential (primary) hypertension: Secondary | ICD-10-CM | POA: Diagnosis not present

## 2017-06-21 DIAGNOSIS — R918 Other nonspecific abnormal finding of lung field: Secondary | ICD-10-CM | POA: Diagnosis not present

## 2017-06-21 DIAGNOSIS — M545 Low back pain: Secondary | ICD-10-CM | POA: Diagnosis not present

## 2017-06-21 DIAGNOSIS — I5022 Chronic systolic (congestive) heart failure: Secondary | ICD-10-CM | POA: Diagnosis not present

## 2017-06-21 DIAGNOSIS — Z6827 Body mass index (BMI) 27.0-27.9, adult: Secondary | ICD-10-CM | POA: Diagnosis not present

## 2017-06-21 NOTE — Telephone Encounter (Signed)
Spoke with Shaun May. Explained that the appt he has is the earliest that he can be seen. Advised that he missed his appt with RA last month. Will place patient on the cancellation list so if possible, he can be seen soon. She verbalized understanding. Nothing else needed at time of call.

## 2017-06-27 NOTE — Progress Notes (Deleted)
[name]   No chief complaint on file.    Primary Pulmonologist: Dr. Elsworth Soho  Current Outpatient Prescriptions on File Prior to Visit  Medication Sig  . albuterol (PROVENTIL) (2.5 MG/3ML) 0.083% nebulizer solution Take 3 mLs (2.5 mg total) by nebulization every 4 (four) hours as needed for wheezing or shortness of breath.  Marland Kitchen apixaban (ELIQUIS) 5 MG TABS tablet Take 1 tablet (5 mg total) by mouth 2 (two) times daily.  Marland Kitchen atorvastatin (LIPITOR) 40 MG tablet TAKE ONE TABLET BY MOUTH ONCE DAILY AT 6PM.  . benzonatate (TESSALON) 200 MG capsule Take 200 mg by mouth 2 (two) times daily.  . carvedilol (COREG) 25 MG tablet Take 12.5 mg by mouth 2 (two) times daily with a meal.   . furosemide (LASIX) 40 MG tablet TAKE ONE TABLET BY MOUTH ONCE DAILY.  Marland Kitchen guaiFENesin-dextromethorphan (ROBITUSSIN DM) 100-10 MG/5ML syrup Take 5 mLs by mouth every 4 (four) hours as needed for cough.  Marland Kitchen LORazepam (ATIVAN) 2 MG tablet Take 1 tablet by mouth 4 (four) times daily.   Marland Kitchen oxyCODONE-acetaminophen (PERCOCET/ROXICET) 5-325 MG tablet Take 1 tablet by mouth 4 (four) times daily as needed (pain).  . pantoprazole (PROTONIX) 40 MG tablet Take 1 tablet (40 mg total) by mouth daily before breakfast.  . potassium chloride SA (K-DUR,KLOR-CON) 20 MEQ tablet Take 40 mEq by mouth daily.   Marland Kitchen rOPINIRole (REQUIP) 0.25 MG tablet Take 1-3 tablets by mouth at bedtime.    No current facility-administered medications on file prior to visit.      Studies: CTA chest 03/29/16 >>>17 mm spiculated posterior right upper lobe nodule  L heart cath 03/31/16 1.  no evidence of obstructive coronary artery disease. Left dominant system with very short left main. 2. High normal left ventricular end-diastolic pressure.  3. Mildly reduced LV systolic function with an ejection fraction of 45% with apical akinesis.  PFTs 04/07/16 >>> Severe airway obstruction with FEV1 27%-0.94, no bronchodilator response, ratio 44, DLCO 26%  PET  04/07/16 >> 14.5 mm right upper lobe pulmonary nodule is hypermetabolic    Past Medical Hx:  has a past medical history of Hemochromatosis; Hypertension; Lung nodule; Mobitz type 2 second degree heart block; Pericardial effusion; and Persistent atrial fibrillation ([place]).   Past Surgical hx, Allergies, Family hx, Social hx all reviewed.  Vital Signs There were no vitals taken for this visit.  History of Present Illness Shaun May is a 66 y.o. male, heavy smoker (2ppd, >100pk year hx**) with a history of HTN, AF on Xarelto (new onset 03/2016), cardiomyopathy (LVEF 35% 03/2016), prior GIB, severe COPD (FEV1 27%-0.94, no BD response, ratio 44, DLCO 26%) and pulmonary nodule (initially dx in 03/2016 1mm at that time, repeat CT in 05/2016 with increase to 37mm) who presented to the pulmonary office for nodule follow up.  He was last seen by Dr. Elsworth Soho 06/28/16 with plans for follow up CT.  Unfortunately, he was lost to follow up and per chart review had several hospitalizations in the interim for GIB and exacerbations of acute systolic CHF (started on lasix and O2) with deferment of biopsy in 04/2016.  He returns again for follow up of lung mass.     Physical Exam  General - well developed adult M in no acute distress ENT - No sinus tenderness, no oral exudate, no LAN Cardiac - s1s2 regular, no murmur Chest - even/non-labored, lungs bilaterally ***. No wheeze/rales Back - No focal tenderness Abd - Soft, non-tender Ext - No edema Neuro -  Normal strength Skin - No rashes Psych - normal mood, and behavior   Assessment/Plan  Discussion:  66 y/o M   There are no Patient Instructions on file for this visit.    Shaun Gens, NP-C [place]  [phone] 06/27/2017, 3:12 PM

## 2017-07-06 ENCOUNTER — Ambulatory Visit: Payer: Medicare Other | Admitting: Pulmonary Disease

## 2017-07-07 ENCOUNTER — Telehealth: Payer: Self-pay | Admitting: Pulmonary Disease

## 2017-07-07 NOTE — Telephone Encounter (Signed)
Spoke with Sylva at Dr. Juel Burrow office. I advised her that we had tried once again to get the patient in sooner to see a provider due to his history. He has now no showed twice. Advised that per RA, he has talked to the patient and the patient has declined any invasive procedures at the moment. She verbalized understanding and thanked the office for working so hard to get the patient scheduled. She will let Dr. Nevada Crane know.   Nothing else was needed at the time of call.

## 2017-08-01 ENCOUNTER — Encounter (HOSPITAL_COMMUNITY): Payer: Self-pay | Admitting: Emergency Medicine

## 2017-08-01 ENCOUNTER — Emergency Department (HOSPITAL_COMMUNITY): Payer: Medicare Other

## 2017-08-01 ENCOUNTER — Emergency Department (HOSPITAL_COMMUNITY)
Admission: EM | Admit: 2017-08-01 | Discharge: 2017-08-01 | Disposition: A | Discharge status: Home / Self Care | Payer: Medicare Other | Attending: Emergency Medicine | Admitting: Emergency Medicine

## 2017-08-01 DIAGNOSIS — Z950 Presence of cardiac pacemaker: Secondary | ICD-10-CM | POA: Diagnosis not present

## 2017-08-01 DIAGNOSIS — R05 Cough: Secondary | ICD-10-CM | POA: Diagnosis not present

## 2017-08-01 DIAGNOSIS — F17290 Nicotine dependence, other tobacco product, uncomplicated: Secondary | ICD-10-CM | POA: Diagnosis not present

## 2017-08-01 DIAGNOSIS — Z79899 Other long term (current) drug therapy: Secondary | ICD-10-CM | POA: Diagnosis not present

## 2017-08-01 DIAGNOSIS — J209 Acute bronchitis, unspecified: Secondary | ICD-10-CM | POA: Diagnosis not present

## 2017-08-01 DIAGNOSIS — Z7901 Long term (current) use of anticoagulants: Secondary | ICD-10-CM | POA: Diagnosis not present

## 2017-08-01 DIAGNOSIS — I5020 Unspecified systolic (congestive) heart failure: Secondary | ICD-10-CM | POA: Diagnosis not present

## 2017-08-01 DIAGNOSIS — I110 Hypertensive heart disease with heart failure: Secondary | ICD-10-CM | POA: Insufficient documentation

## 2017-08-01 DIAGNOSIS — J40 Bronchitis, not specified as acute or chronic: Secondary | ICD-10-CM | POA: Diagnosis not present

## 2017-08-01 DIAGNOSIS — R0902 Hypoxemia: Secondary | ICD-10-CM

## 2017-08-01 DIAGNOSIS — F17210 Nicotine dependence, cigarettes, uncomplicated: Secondary | ICD-10-CM | POA: Insufficient documentation

## 2017-08-01 DIAGNOSIS — R0602 Shortness of breath: Secondary | ICD-10-CM | POA: Diagnosis not present

## 2017-08-01 LAB — CBC WITH DIFFERENTIAL/PLATELET
BASOS ABS: 0 10*3/uL (ref 0.0–0.1)
Basophils Relative: 0 %
EOS PCT: 2 %
Eosinophils Absolute: 0.1 10*3/uL (ref 0.0–0.7)
HEMATOCRIT: 41.1 % (ref 39.0–52.0)
Hemoglobin: 13.4 g/dL (ref 13.0–17.0)
LYMPHS ABS: 1 10*3/uL (ref 0.7–4.0)
Lymphocytes Relative: 13 %
MCH: 32.3 pg (ref 26.0–34.0)
MCHC: 32.6 g/dL (ref 30.0–36.0)
MCV: 99 fL (ref 78.0–100.0)
Monocytes Absolute: 0.6 10*3/uL (ref 0.1–1.0)
Monocytes Relative: 8 %
NEUTROS ABS: 6 10*3/uL (ref 1.7–7.7)
Neutrophils Relative %: 77 %
PLATELETS: 156 10*3/uL (ref 150–400)
RBC: 4.15 MIL/uL — AB (ref 4.22–5.81)
RDW: 14.4 % (ref 11.5–15.5)
WBC: 7.8 10*3/uL (ref 4.0–10.5)

## 2017-08-01 LAB — BASIC METABOLIC PANEL
ANION GAP: 12 (ref 5–15)
BUN: 9 mg/dL (ref 6–20)
CHLORIDE: 90 mmol/L — AB (ref 101–111)
CO2: 33 mmol/L — ABNORMAL HIGH (ref 22–32)
Calcium: 8.4 mg/dL — ABNORMAL LOW (ref 8.9–10.3)
Creatinine, Ser: 0.86 mg/dL (ref 0.61–1.24)
GFR calc Af Amer: >60 mL/min (ref >60)
GLUCOSE: 99 mg/dL (ref 65–99)
POTASSIUM: 3.6 mmol/L (ref 3.5–5.1)
Sodium: 135 mmol/L (ref 135–145)

## 2017-08-01 LAB — TROPONIN I: Troponin I: <0.03 ng/mL (ref <0.03)

## 2017-08-01 LAB — BRAIN NATRIURETIC PEPTIDE: B Natriuretic Peptide: 146 pg/mL — ABNORMAL HIGH (ref 0.0–100.0)

## 2017-08-01 MED ORDER — DM-GUAIFENESIN ER 30-600 MG PO TB12: 1.0000 | ORAL_TABLET | Freq: Two times a day (BID) | ORAL | 1 refills | Status: DC

## 2017-08-01 MED ORDER — IOPAMIDOL (ISOVUE-370) INJECTION 76%
100.0000 mL | Freq: Once | INTRAVENOUS | Status: AC | PRN
  Administered 2017-08-01: 100 mL via INTRAVENOUS

## 2017-08-01 MED ORDER — ALBUTEROL SULFATE HFA 108 (90 BASE) MCG/ACT IN AERS: 1.0000 | INHALATION_SPRAY | Freq: Four times a day (QID) | RESPIRATORY_TRACT | 0 refills | Status: AC | PRN

## 2017-08-01 MED ORDER — PREDNISONE 10 MG PO TABS: 40.0000 mg | ORAL_TABLET | Freq: Every day | ORAL | 0 refills | Status: DC

## 2017-08-01 MED ORDER — METHYLPREDNISOLONE SODIUM SUCC 125 MG IJ SOLR
125.0000 mg | Freq: Once | INTRAMUSCULAR | Status: AC
  Administered 2017-08-01: 125 mg via INTRAVENOUS
  Filled 2017-08-01: qty 2

## 2017-08-01 NOTE — Care Management (Signed)
CM consult received by Dr. Jerilee Hoh. Pt from home, has oxygen but it is unclear if pt has set up for cont o2 or only at night. Pt reports having concentrator and small prot tank. CM placed call for [facility] rep, Vaughan Basta for clarification. Pt does have cont oxygen at home. [facility] rep will take pt port tank to ED room for transport home.

## 2017-08-01 NOTE — ED Provider Notes (Signed)
[facility] DEPT Provider Note   CSN: [id number] Arrival date & time: 08/01/17  0909     History   Chief Complaint Chief Complaint  Patient presents with  . Cough    HPI Shaun May is a 66 y.o. male.  Patient arrived with a complaint of cough and shortness of breath for 3 days. Coughing up clear sputum. Arrived with labored breathing. Also with a complaint of lower extremity swelling. Denies any pain other than some abdominal discomfort from the persistent cough. Cough has been severe according to the patient. Patient has a pacemaker for persistent atrial fibrillation and second-degree heart block. Patient is known to have a left pulmonary nodule and has follow-up later this week for evaluation of this. Patient has the ability use oxygen at home but does not have portable oxygen. Off of oxygen he sats down into the 80s.      Past Medical History:  Diagnosis Date  . Hemochromatosis   . Hypertension   . Lung nodule   . Mobitz type 2 second degree heart block    PPM MDT 10/28/15 Dr. Lovena Le  . Pericardial effusion   . Persistent atrial fibrillation [facility])     Patient Active Problem List   Diagnosis Date Noted  . Symptomatic anemia 05/25/2016  . Anemia 05/25/2016  . Hypokalemia 05/25/2016  . Hyponatremia 05/25/2016  . Atrial flutter ([place]), paroxysmal   . Pericardial effusion   . Acute on chronic systolic (congestive) heart failure ([place]) 03/29/2016  . COPD (chronic obstructive pulmonary disease) ([place]) 03/29/2016  . Elevated troponin 03/29/2016  . Restless leg syndrome 03/29/2016  . Pulmonary nodule, right 03/29/2016  . Chest pain 03/27/2016  . COPD exacerbation ([place]) 03/27/2016  . Hypertension   . Essential hypertension   . Bradycardia 10/25/2015  . Heart block AV second degree 10/25/2015  . Accelerated hypertension 10/25/2015  . Hemochromatosis 10/25/2015  . Tobacco abuse 10/25/2015  . Alcohol dependence ([place]) 10/25/2015  . Second degree AV block 10/25/2015      Past Surgical History:  Procedure Laterality Date  . BIOPSY  10/13/2016   Procedure: BIOPSY;  Surgeon: Rogene Houston, MD;  Location[facility];  Service: Endoscopy;;  esophageal  . CARDIAC CATHETERIZATION N/A 03/31/2016   Procedure: Left Heart Cath and Coronary Angiography;  Surgeon: Wellington Hampshire, MD;  Location: [facility] LAB;  Service: Cardiovascular;  Laterality: N/A;  . COLONOSCOPY N/A 10/13/2016   Procedure: COLONOSCOPY;  Surgeon: Rogene Houston, MD;  Location: [facility];  Service: Endoscopy;  Laterality: N/A;  . EP IMPLANTABLE DEVICE N/A 10/28/2015   Procedure: Pacemaker Implant;  Surgeon: Evans Lance, MD;  Location: [facility];  Service: Cardiovascular;  Laterality: N/A;  . EP IMPLANTABLE DEVICE N/A 11/07/2015   Procedure: Pocket Revision;  Surgeon: Evans Lance, MD;  Location: [facility] LAB;  Service: Cardiovascular;  Laterality: N/A;  . ESOPHAGOGASTRODUODENOSCOPY N/A 05/27/2016   Procedure: ESOPHAGOGASTRODUODENOSCOPY (EGD);  Surgeon: Rogene Houston, MD;  [facility];  Service: Endoscopy;  Laterality: N/A;  . ESOPHAGOGASTRODUODENOSCOPY N/A 10/13/2016   Procedure: ESOPHAGOGASTRODUODENOSCOPY (EGD);  Surgeon: Rogene Houston, MD;  [facility];  Service: Endoscopy;  Laterality: N/A;  1:30 - moved to 11/8 @ 1:00 - Ann notified pt  . POLYPECTOMY  10/13/2016   Procedure: POLYPECTOMY;  Surgeon: Rogene Houston, MD;  Location[facility];  Service: Endoscopy;;  transverse colon polypectomy  . TONSILLECTOMY  Home Medications    Prior to Admission medications   Medication Sig Start Date End Date Taking? Authorizing Provider  albuterol (PROVENTIL) (2.5 MG/3ML) 0.083% nebulizer solution Take 3 mLs (2.5 mg total) by nebulization every 4 (four) hours as needed for wheezing or shortness of breath. 04/01/16  Yes Eileen Stanford, PA-C  atorvastatin (LIPITOR) 40 MG tablet TAKE ONE TABLET BY MOUTH ONCE DAILY AT 6PM. 09/22/16   Yes Herminio Commons, MD  carvedilol (COREG) 25 MG tablet Take 12.5 mg by mouth 2 (two) times daily with a meal.  01/23/16  Yes [provider]  Dextromethorphan-Guaifenesin (MUCINEX DM MAXIMUM STRENGTH PO) Take 1,200 mg by mouth daily.   Yes [provider]  furosemide (LASIX) 40 MG tablet TAKE ONE TABLET BY MOUTH ONCE DAILY. 07/30/16  Yes Herminio Commons, MD  LORazepam (ATIVAN) 2 MG tablet Take 1 tablet by mouth 4 (four) times daily.  10/01/15  Yes [provider]  oxyCODONE-acetaminophen (PERCOCET/ROXICET) 5-325 MG tablet Take 1 tablet by mouth 4 (four) times daily as needed (pain).   Yes [provider]  pantoprazole (PROTONIX) 40 MG tablet Take 1 tablet (40 mg total) by mouth daily before breakfast. 01/25/17  Yes Rehman, Mechele Dawley, MD  potassium chloride SA (K-DUR,KLOR-CON) 20 MEQ tablet Take 40 mEq by mouth daily.  10/14/15  Yes [provider]  rOPINIRole (REQUIP) 0.25 MG tablet Take 1-3 tablets by mouth at bedtime.  02/25/16  Yes [provider]  apixaban (ELIQUIS) 5 MG TABS tablet Take 1 tablet (5 mg total) by mouth 2 (two) times daily. Patient not taking: Reported on 08/01/2017 02/21/17   Thompson Grayer, MD  benzonatate (TESSALON) 200 MG capsule Take 200 mg by mouth 2 (two) times daily.    [provider]  guaiFENesin-dextromethorphan (ROBITUSSIN DM) 100-10 MG/5ML syrup Take 5 mLs by mouth every 4 (four) hours as needed for cough.    [provider]    Family History Family History  Problem Relation Age of Onset  . Diabetes Other   . Dementia Mother     Social History Social History  Substance Use Topics  . Smoking status: Current Every Day Smoker    Packs/day: 2.00    Years: 56.00    Types: E-cigarettes, Cigarettes  . Smokeless tobacco: Never Used     Comment: e cigarettes  . Alcohol use 12.6 oz/week    21 Shots of liquor per week     Comment: nightly     Allergies   Xarelto  [rivaroxaban]   Review of Systems Review of Systems  Constitutional: Positive for fatigue.  HENT: Positive for congestion.   Eyes: Negative for redness.  Respiratory: Positive for shortness of breath.   Cardiovascular: Negative for chest pain.  Gastrointestinal: Negative for abdominal pain.  Genitourinary: Negative for dysuria.  Musculoskeletal: Positive for myalgias. Negative for back pain.  Neurological: Negative for headaches.  Hematological: Does not bruise/bleed easily.  Psychiatric/Behavioral: Negative for confusion.     Physical Exam Updated Vital Signs BP 135/75   Pulse 71   Temp (!) 97.5 F (36.4 C) (Oral)   Resp 16   Ht 1.778 m (5\' 10" )   Wt 86.2 kg (190 lb)   SpO2 92%   BMI 27.26 kg/m   Physical Exam  Constitutional: He is oriented to person, place, and time. He appears well-developed and well-nourished. He appears distressed.  HENT:  Head: Normocephalic and atraumatic.  Mouth/Throat: Oropharynx is clear and moist.  Eyes: Pupils are equal, round,  and reactive to light. Conjunctivae and EOM are normal.  Neck: Normal range of motion. Neck supple.  Cardiovascular: Normal rate, regular rhythm and normal heart sounds.   Pulmonary/Chest: Effort normal and breath sounds normal. He has no wheezes. He has no rales.  Decreased breath sounds bilaterally  Abdominal: Soft. Bowel sounds are normal. He exhibits no distension. There is no tenderness.  Musculoskeletal: Normal range of motion.  Neurological: He is alert and oriented to person, place, and time. No cranial nerve deficit or sensory deficit. He exhibits normal muscle tone. Coordination normal.  Nursing note and vitals reviewed.    ED Treatments / Results  Labs (all labs ordered are listed, but only abnormal results are displayed) Labs Reviewed  CBC WITH DIFFERENTIAL/PLATELET - Abnormal; Notable for the following:       Result Value   RBC 4.15 (*)    All other components within normal limits  BASIC  METABOLIC PANEL - Abnormal; Notable for the following:    Chloride 90 (*)    CO2 33 (*)    Calcium 8.4 (*)    All other components within normal limits  BRAIN NATRIURETIC PEPTIDE - Abnormal; Notable for the following:    B Natriuretic Peptide 146.0 (*)    All other components within normal limits  TROPONIN I    EKG  EKG Interpretation  Date/Time:  Monday August 01 2017 09:52:52 EDT Ventricular Rate:  63 PR Interval:    QRS Duration: 172 QT Interval:  470 QTC Calculation: 482 R Axis:   -103 Text Interpretation:  Atrial-sensed ventricular-paced rhythm No further analysis attempted due to paced rhythm Confirmed by Fredia Sorrow [phone]) on 08/01/2017 11:34:02 AM       Radiology Ct Angio Chest Pe W/cm &/or Wo Cm  Result Date: 08/01/2017 CLINICAL DATA:  Suspected PE, high pretest probability. Productive cough and shortness of breath for 3 days. History of lung nodule, hypertension. EXAM: CT ANGIOGRAPHY CHEST WITH CONTRAST TECHNIQUE: Multidetector CT imaging of the chest was performed using the standard protocol during bolus administration of intravenous contrast. Multiplanar CT image reconstructions and MIPs were obtained to evaluate the vascular anatomy. CONTRAST:  80 cc Isovue 370 COMPARISON:  Chest CTs dated 05/06/2017 and 03/29/2016. FINDINGS: Cardiovascular: There is no pulmonary embolism identified within the main, lobar or segmental pulmonary artery branches bilaterally. Scattered atherosclerosis of the normal caliber thoracic aorta. No aortic aneurysm or dissection seen. Heart size is normal. No pericardial effusion. Coronary artery calcifications noted. Mediastinum/Nodes: No mass or enlarged lymph nodes seen within the mediastinum or perihilar regions. Scattered small lymph nodes within the mediastinum, similar to previous exam. Lungs/Pleura: Continued interval enlargement of the irregular/ spiculated mass within the right upper lobe, now measuring 2.5 cm greatest dimension (2.3 cm  on previous study of 05/06/2017, 1.7 cm on earlier CT of 03/29/2016. No new lung nodules or masses. No pneumonia or pulmonary edema. No pleural effusion. Emphysematous changes again noted bilaterally, upper lobe predominant, moderate in degree. Upper Abdomen: No acute findings. Right renal cyst, also described on earlier PET-CT report of 04/07/2016. Musculoskeletal: Mild degenerative spurring/ankylosis throughout the slightly kyphotic thoracic spine. No acute or suspicious osseous finding. Review of the MIP images confirms the above findings. IMPRESSION: 1. No acute findings. No pulmonary embolism. No pneumonia or pulmonary edema. 2. Continued interval enlargement of the irregular/spiculated pulmonary mass within the right upper lobe, now measuring 2.5 cm (1.7 cm on earlier CT of 14/48/1856), hypermetabolic on PET-CT of 31/49/7026 suggesting bronchogenic carcinoma. 3. Emphysema. 4. Aortic atherosclerosis.  Aortic Atherosclerosis (ICD10-I70.0) and Emphysema (ICD10-J43.9). Electronically Signed   By: Franki Cabot M.D.   On: 08/01/2017 13:23   Dg Chest Portable 1 View  Result Date: 08/01/2017 CLINICAL DATA:  Cough.  Shortness of breath . EXAM: PORTABLE CHEST 1 VIEW COMPARISON:  10/07/2016 . FINDINGS: Mediastinum and hilar structures normal. Lungs are clear. Left costophrenic angle incompletely imaged. Cardiac pacer with lead tips in right atrium and right ventricle. Cardiomegaly. IMPRESSION: 1. No acute pulmonary disease. 2. Cardiac pacer with lead tips in right atrium and right ventricle. Electronically Signed   By: Marcello Moores  Register   On: 08/01/2017 10:24    Procedures Procedures (including critical care time)  Medications Ordered in ED Medications  iopamidol (ISOVUE-370) 76 % injection 100 mL (100 mLs Intravenous Contrast Given 08/01/17 1248)  methylPREDNISolone sodium succinate (SOLU-MEDROL) 125 mg/2 mL injection 125 mg (125 mg Intravenous Given 08/01/17 1553)     Initial Impression / Assessment and  Plan / ED Course  I have reviewed the triage vital signs and the nursing notes.  Pertinent labs & imaging results that were available during my care of the patient were reviewed by me and considered in my medical decision making (see chart for details).     Patient has a availability of oxygen at home which he uses when necessary. Patient does not have any portable oxygen patient arrived here off his oxygen and hypoxic with sats into the 80s. We ambulated him here after workup and patient sagittally down to 80% off of oxygen. On oxygen with ambulation he sat it down to around 90 but was very symptomatic and short of breath. Extensive workup to include CT of chest without evidence of pulmonary embolus. No evidence of pneumonia. Suspect that this is a exacerbation of bronchitis.  Patient is known to have a left-sided pulmonary nodule which is presently being worked up.  Patient treated here with Solu-Medrol. Patient will be evaluated by hospitalist for possible admission or for care manager to arrange home oxygen. Clinically patient really seems to struggle on his oxygen with ambulation as well. Although he technically does not desat below 90%. But got right to the border.  Final Clinical Impressions(s) / ED Diagnoses   Final diagnoses:  Bronchitis  Hypoxia    New Prescriptions New Prescriptions   No medications on file     Fredia Sorrow, MD 08/01/17 1622

## 2017-08-01 NOTE — Discharge Instructions (Signed)
And keep your appointment for follow-up for the pulmonary nodule biopsy later this week. Start using 2 L of oxygen at home at all times. And also usually new portable unit. Use albuterol inhaler 2 puffs every 6 hours for the next 7 days. Take prednisone as directed for the next 5 days. Take Mucinex DM to help depress the cough and clear the mucus

## 2017-08-01 NOTE — ED Notes (Addendum)
02 2l [place] applied. Pt having coughing spells during triage.

## 2017-08-01 NOTE — Consult Note (Signed)
Requesting physician: Fredia Sorrow, EDP  Primary Care Physician: Celene Squibb, MD  Reason for consultation: Potential admission   History of Present Illness: 66 year old man with history of chronic hypoxemic respiratory failure and long-term tobacco use comes into the hospital today with a five-day history of cough. Cough is nonproductive, denies fevers or chills. It is important to note that he has not been using oxygen as apparently the home health agency did not deliver a portable tank and upon arrival his O2 sat was 80% on room air which quickly rose up to high 90s with 2-3 L. Vital signs are otherwise normal. Labs are completely within normal limits, most significantly normal white blood cell count. Chest x-ray without acute disease. CT angiogram of the chest without acute findings, no PE, no pneumonia or pulmonary edema. There is however continued interval enlargement of the spiculated pulmonary mass within the right upper lobe suggesting bronchogenic carcinoma. I am asked to see the patient for potential admission for bronchitis.  Allergies:   Allergies  Allergen Reactions  . Xarelto [Rivaroxaban] Other (See Comments)    Caused bleeding.       Past Medical History:  Diagnosis Date  . Hemochromatosis   . Hypertension   . Lung nodule   . Mobitz type 2 second degree heart block    PPM MDT 10/28/15 Dr. Lovena Le  . Pericardial effusion   . Persistent atrial fibrillation [facility])     Past Surgical History:  Procedure Laterality Date  . BIOPSY  10/13/2016   Procedure: BIOPSY;  Surgeon: Rogene Houston, MD;  [facility];  Service: Endoscopy;;  esophageal  . CARDIAC CATHETERIZATION N/A 03/31/2016   Procedure: Left Heart Cath and Coronary Angiography;  Surgeon: Wellington Hampshire, MD;  Location: [facility];  Service: Cardiovascular;  Laterality: N/A;  . COLONOSCOPY N/A 10/13/2016   Procedure: COLONOSCOPY;  Surgeon: Rogene Houston, MD;  [facility];   Service: Endoscopy;  Laterality: N/A;  . EP IMPLANTABLE DEVICE N/A 10/28/2015   Procedure: Pacemaker Implant;  Surgeon: Evans Lance, MD;  Location: [facility];  Service: Cardiovascular;  Laterality: N/A;  . EP IMPLANTABLE DEVICE N/A 11/07/2015   Procedure: Pocket Revision;  Surgeon: Evans Lance, MD;  Location: [place] [facility];  Service: Cardiovascular;  Laterality: N/A;  . ESOPHAGOGASTRODUODENOSCOPY N/A 05/27/2016   Procedure: ESOPHAGOGASTRODUODENOSCOPY (EGD);  Surgeon: Rogene Houston, MD;  [facility];  Service: Endoscopy;  Laterality: N/A;  . ESOPHAGOGASTRODUODENOSCOPY N/A 10/13/2016   Procedure: ESOPHAGOGASTRODUODENOSCOPY (EGD);  Surgeon: Rogene Houston, MD;  [facility];  Service: Endoscopy;  Laterality: N/A;  1:30 - moved to 11/8 @ 1:00 - Ann notified pt  . POLYPECTOMY  10/13/2016   Procedure: POLYPECTOMY;  Surgeon: Rogene Houston, MD;  [facility];  Service: Endoscopy;;  transverse colon polypectomy  . TONSILLECTOMY      Scheduled Meds: Continuous Infusions: PRN Meds:.  Social History:  reports that he has been smoking E-cigarettes and Cigarettes.  He has a 112.00 pack-year smoking history. He has never used smokeless tobacco. He reports that he drinks about 12.6 oz of alcohol per week . He reports that he does not use drugs.  Family History  Problem Relation Age of Onset  . Diabetes Other   . Dementia Mother     Review of Systems:  Constitutional: Denies fever, chills, diaphoresis, appetite change and fatigue.  HEENT: Denies photophobia, eye pain, redness, hearing loss,  ear pain, congestion, sore throat, rhinorrhea, sneezing, mouth sores, trouble swallowing, neck pain, neck stiffness and tinnitus.   Respiratory: Denies  DOE, , chest tightness,  and wheezing.   Cardiovascular: Denies chest pain, palpitations and leg swelling.  Gastrointestinal: Denies nausea, vomiting, abdominal pain, diarrhea, constipation, blood in stool and  abdominal distention.  Genitourinary: Denies dysuria, urgency, frequency, hematuria, flank pain and difficulty urinating.  Endocrine: Denies: hot or cold intolerance, sweats, changes in hair or nails, polyuria, polydipsia. Musculoskeletal: Denies myalgias, back pain, joint swelling, arthralgias and gait problem.  Skin: Denies pallor, rash and wound.  Neurological: Denies dizziness, seizures, syncope, weakness, light-headedness, numbness and headaches.  Hematological: Denies adenopathy. Easy bruising, personal or family bleeding history  Psychiatric/Behavioral: Denies suicidal ideation, mood changes, confusion, nervousness, sleep disturbance and agitation   Physical Exam: Blood pressure (!) 180/90, pulse 69, temperature (!) 97.5 F (36.4 C), temperature source Oral, resp. rate (!) 24, height 5' 10"  (1.778 m), weight 86.2 kg (190 lb), SpO2 95 %.  General: Alert, awake, oriented 3, no distress HEENT: Normocephalic, atraumatic, pupils equal and reactive to light, extra movements intact, moist mucous membranes Neck: Supple, no JVD, lymphadenopathy, bruits, no goiter Cardiovascular: Regular rate and rhythm, no murmurs, rubs or gallops Lungs: Clear to auscultation bilaterally, no wheezing, no rhonchi Abdomen: Soft, nontender, nondistended, positive bowel sounds, no masses or organomegaly noted on exam Extremities: Trace bilateral edema, positive pulses Neurologic: Grossly intact and nonfocal  Labs on Admission:  Results for orders placed or performed during the hospital encounter of 08/01/17 (from the past 48 hour(s))  CBC with Differential     Status: Abnormal   Collection Time: 08/01/17  9:28 AM  Result Value Ref Range   WBC 7.8 4.0 - 10.5 K/uL   RBC 4.15 (L) 4.22 - 5.81 MIL/uL   Hemoglobin 13.4 13.0 - 17.0 g/dL   HCT 41.1 39.0 - 52.0 %   MCV 99.0 78.0 - 100.0 fL   MCH 32.3 26.0 - 34.0 pg   MCHC 32.6 30.0 - 36.0 g/dL   RDW 14.4 11.5 - 15.5 %   Platelets 156 150 - 400 K/uL    Neutrophils Relative % 77 %   Neutro Abs 6.0 1.7 - 7.7 K/uL   Lymphocytes Relative 13 %   Lymphs Abs 1.0 0.7 - 4.0 K/uL   Monocytes Relative 8 %   Monocytes Absolute 0.6 0.1 - 1.0 K/uL   Eosinophils Relative 2 %   Eosinophils Absolute 0.1 0.0 - 0.7 K/uL   Basophils Relative 0 %   Basophils Absolute 0.0 0.0 - 0.1 K/uL  Basic metabolic panel     Status: Abnormal   Collection Time: 08/01/17  9:28 AM  Result Value Ref Range   Sodium 135 135 - 145 mmol/L   Potassium 3.6 3.5 - 5.1 mmol/L   Chloride 90 (L) 101 - 111 mmol/L   CO2 33 (H) 22 - 32 mmol/L   Glucose, Bld 99 65 - 99 mg/dL   BUN 9 6 - 20 mg/dL   Creatinine, Ser 0.86 0.61 - 1.24 mg/dL   Calcium 8.4 (L) 8.9 - 10.3 mg/dL   GFR calc non Af Amer >60 >60 mL/min   GFR calc Af Amer >60 >60 mL/min    Comment: (NOTE) The eGFR has been calculated using the CKD EPI equation. This calculation has not been validated in all clinical situations. eGFR's persistently <60 mL/min signify possible Chronic Kidney Disease.    Anion gap 12 5 - 15  Troponin I  Status: None   Collection Time: 08/01/17  9:28 AM  Result Value Ref Range   Troponin I <0.03 <0.03 ng/mL  Brain natriuretic peptide     Status: Abnormal   Collection Time: 08/01/17  9:28 AM  Result Value Ref Range   B Natriuretic Peptide 146.0 (H) 0.0 - 100.0 pg/mL    Radiological Exams on Admission: Ct Angio Chest Pe W/cm &/or Wo Cm  Result Date: 08/01/2017 CLINICAL DATA:  Suspected PE, high pretest probability. Productive cough and shortness of breath for 3 days. History of lung nodule, hypertension. EXAM: CT ANGIOGRAPHY CHEST WITH CONTRAST TECHNIQUE: Multidetector CT imaging of the chest was performed using the standard protocol during bolus administration of intravenous contrast. Multiplanar CT image reconstructions and MIPs were obtained to evaluate the vascular anatomy. CONTRAST:  80 cc Isovue 370 COMPARISON:  Chest CTs dated 05/06/2017 and 03/29/2016. FINDINGS: Cardiovascular:  There is no pulmonary embolism identified within the main, lobar or segmental pulmonary artery branches bilaterally. Scattered atherosclerosis of the normal caliber thoracic aorta. No aortic aneurysm or dissection seen. Heart size is normal. No pericardial effusion. Coronary artery calcifications noted. Mediastinum/Nodes: No mass or enlarged lymph nodes seen within the mediastinum or perihilar regions. Scattered small lymph nodes within the mediastinum, similar to previous exam. Lungs/Pleura: Continued interval enlargement of the irregular/ spiculated mass within the right upper lobe, now measuring 2.5 cm greatest dimension (2.3 cm on previous study of 05/06/2017, 1.7 cm on earlier CT of 03/29/2016. No new lung nodules or masses. No pneumonia or pulmonary edema. No pleural effusion. Emphysematous changes again noted bilaterally, upper lobe predominant, moderate in degree. Upper Abdomen: No acute findings. Right renal cyst, also described on earlier PET-CT report of 04/07/2016. Musculoskeletal: Mild degenerative spurring/ankylosis throughout the slightly kyphotic thoracic spine. No acute or suspicious osseous finding. Review of the MIP images confirms the above findings. IMPRESSION: 1. No acute findings. No pulmonary embolism. No pneumonia or pulmonary edema. 2. Continued interval enlargement of the irregular/spiculated pulmonary mass within the right upper lobe, now measuring 2.5 cm (1.7 cm on earlier CT of 16/09/9603), hypermetabolic on PET-CT of 54/08/8118 suggesting bronchogenic carcinoma. 3. Emphysema. 4. Aortic atherosclerosis. Aortic Atherosclerosis (ICD10-I70.0) and Emphysema (ICD10-J43.9). Electronically Signed   By: Franki Cabot M.D.   On: 08/01/2017 13:23   Dg Chest Portable 1 View  Result Date: 08/01/2017 CLINICAL DATA:  Cough.  Shortness of breath . EXAM: PORTABLE CHEST 1 VIEW COMPARISON:  10/07/2016 . FINDINGS: Mediastinum and hilar structures normal. Lungs are clear. Left costophrenic angle  incompletely imaged. Cardiac pacer with lead tips in right atrium and right ventricle. Cardiomegaly. IMPRESSION: 1. No acute pulmonary disease. 2. Cardiac pacer with lead tips in right atrium and right ventricle. Electronically Signed   By: Marcello Moores  Register   On: 08/01/2017 10:24    Assessment/Plan   Acute on chronic hypoxemic respiratory failure -Chest x-ray and chest CT without acute findings. -Given his rapid improvement of hypoxemia on supplemental oxygen, suspect this is simply due to a lack of home oxygen that he is supposed to be on. -I discussed with case management who will arrange a portable oxygen tank for patient to take home today. -He was given 1 dose of Solu-Medrol in the ED. I do not believe this needs to be continued at this point given lack of physical exam findings suggestive of COPD or bronchitis. -Also see no need for antibiotics given lack of infectious signs. -I believe patient may go home today without further workup.  Enlarging spiculated lung mass  -  Suspicious for bronchogenic carcinoma especially in this gentleman with prolonged tobacco use.  -Has follow-up in 2 days with pulmonary, I believe biopsy will need to be arranged.   Time Spent on Consultation: 85 minutes  HERNANDEZ ACOSTA,ESTELA [facility]  [phone] 08/01/2017, 6:04 PM

## 2017-08-01 NOTE — ED Triage Notes (Signed)
Pt c/o cough and sob x 2-3 days. Clear sputum. Mild labored breathing while resting noted. ble swelling noted. A/o. Denies pain

## 2017-08-02 ENCOUNTER — Ambulatory Visit (INDEPENDENT_AMBULATORY_CARE_PROVIDER_SITE_OTHER): Payer: Medicare Other | Admitting: *Deleted

## 2017-08-02 ENCOUNTER — Other Ambulatory Visit: Payer: Self-pay | Admitting: Cardiovascular Disease

## 2017-08-02 DIAGNOSIS — I442 Atrioventricular block, complete: Secondary | ICD-10-CM | POA: Diagnosis not present

## 2017-08-02 NOTE — Progress Notes (Signed)
Remote pacemaker transmission.   

## 2017-08-03 ENCOUNTER — Telehealth: Payer: Self-pay | Admitting: Pulmonary Disease

## 2017-08-03 ENCOUNTER — Institutional Professional Consult (permissible substitution): Payer: Medicare Other | Admitting: Internal Medicine

## 2017-08-03 LAB — CUP PACEART REMOTE DEVICE CHECK
Battery Remaining Longevity: 134 mo
Brady Statistic AP VP Percent: 43 %
Brady Statistic AP VS Percent: 0 %
Brady Statistic AS VP Percent: 57 %
Brady Statistic AS VS Percent: 0 %
Implantable Lead Implant Date: 20161122
Implantable Lead Location: 753860
Implantable Lead Model: 5076
Implantable Pulse Generator Implant Date: 20161122
Lead Channel Impedance Value: 611 Ohm
Lead Channel Pacing Threshold Amplitude: 0.625 V
Lead Channel Pacing Threshold Amplitude: 0.625 V
Lead Channel Pacing Threshold Pulse Width: 0.4 ms
Lead Channel Setting Pacing Amplitude: 2 V
Lead Channel Setting Pacing Pulse Width: 0.4 ms
MDC IDC LEAD IMPLANT DT: 20161122
MDC IDC LEAD LOCATION: 753859
MDC IDC MSMT BATTERY IMPEDANCE: 115 Ohm
MDC IDC MSMT BATTERY VOLTAGE: 2.78 V
MDC IDC MSMT LEADCHNL RA IMPEDANCE VALUE: 570 Ohm
MDC IDC MSMT LEADCHNL RA PACING THRESHOLD PULSEWIDTH: 0.4 ms
MDC IDC SESS DTM: 20180828162532
MDC IDC SET LEADCHNL RV PACING AMPLITUDE: 2.5 V
MDC IDC SET LEADCHNL RV SENSING SENSITIVITY: 4 mV

## 2017-08-03 NOTE — Telephone Encounter (Signed)
lmtcb for [employer].

## 2017-08-04 ENCOUNTER — Ambulatory Visit: Payer: Medicare Other | Admitting: Acute Care

## 2017-08-04 NOTE — Telephone Encounter (Signed)
Called and spoke with pts brother and he will have the pt call once he gets in at work.

## 2017-08-05 NOTE — Telephone Encounter (Signed)
Called and spoke with pt and he is aware of appt with MW on 9/6 at 22.  Pt was advised to bring all of his meds to this appt.

## 2017-08-05 NOTE — Telephone Encounter (Signed)
Called and spoke with pt and he stated that he would like to change doctors---from RA to MW.  Pt stated that he would like to have an afternoon appt.    RA and MW please advise if you are ok with this change.  Thanks

## 2017-08-05 NOTE — Telephone Encounter (Signed)
OK with me.

## 2017-08-05 NOTE — Telephone Encounter (Signed)
Fine with me but will need to bring with him all active meds for the first ov

## 2017-08-11 ENCOUNTER — Encounter: Payer: Self-pay | Admitting: Internal Medicine

## 2017-08-11 ENCOUNTER — Ambulatory Visit (INDEPENDENT_AMBULATORY_CARE_PROVIDER_SITE_OTHER): Payer: Medicare Other | Admitting: Internal Medicine

## 2017-08-11 VITALS — BP 142/84 | HR 64 | Ht 70.0 in | Wt 186.0 lb

## 2017-08-11 DIAGNOSIS — R911 Solitary pulmonary nodule: Secondary | ICD-10-CM

## 2017-08-11 DIAGNOSIS — F17210 Nicotine dependence, cigarettes, uncomplicated: Secondary | ICD-10-CM

## 2017-08-11 DIAGNOSIS — J449 Chronic obstructive pulmonary disease, unspecified: Secondary | ICD-10-CM | POA: Diagnosis not present

## 2017-08-11 DIAGNOSIS — J9611 Chronic respiratory failure with hypoxia: Secondary | ICD-10-CM | POA: Diagnosis not present

## 2017-08-11 MED ORDER — GLYCOPYRROLATE-FORMOTEROL 9-4.8 MCG/ACT IN AERO: 2.0000 | INHALATION_SPRAY | Freq: Two times a day (BID) | RESPIRATORY_TRACT | 3 refills | Status: DC

## 2017-08-11 MED ORDER — GLYCOPYRROLATE-FORMOTEROL 9-4.8 MCG/ACT IN AERO: 2.0000 | INHALATION_SPRAY | Freq: Two times a day (BID) | RESPIRATORY_TRACT | 0 refills | Status: DC

## 2017-08-11 NOTE — Progress Notes (Signed)
Subjective:     Patient ID: Shaun May, male   DOB: [birth year],    MRN: [id number]  HPI  37 yowm active smoker with GOLD IV and spiculated nodule in RUL requesting second opinion   08/11/2017 1st [facility] office visit/ Emiliya Chretien   Chief Complaint  Patient presents with  . Pulmonary Consult    Switching care from Dr Elsworth Soho. Pt c/o increased coughing for at least the past month- hard to produce sputum and when he does it's clear and thick. He gets winded with taking the trash down the driveway.   doe = mb and back flat mild sob x 50 ft = MMRC3 = can't walk 100 yards even at a slow pace at a flat grade s stopping due to sob   Sleeps on 2lpm at bedtime  And uses prn daytime  Cough worse first thing in am / congested sounding but all white at this point  No obvious day to day or daytime variability or assoc  purulent sputum or mucus plugs or hemoptysis or cp or chest tightness, subjective wheeze or overt sinus or hb symptoms. No unusual exp hx or h/o childhood pna/ asthma or knowledge of premature birth.  Sleeping ok without nocturnal  or early am exacerbation  of respiratory  c/o's or need for noct saba. Also denies any obvious fluctuation of symptoms with weather or environmental changes or other aggravating or alleviating factors except as outlined above   Current Medications, Allergies, Complete Past Medical History, Past Surgical History, Family History, and Social History were reviewed in [employer] record.  ROS  The following are not active complaints unless bolded sore throat, dysphagia, dental problems, itching, sneezing,  nasal congestion or excess/ purulent secretions, ear ache,   fever, chills, sweats, unintended wt loss, classically pleuritic or exertional cp,  orthopnea pnd or leg swelling, presyncope, palpitations, abdominal pain, anorexia, nausea, vomiting, diarrhea  or change in bowel or bladder habits, change in stools or urine, dysuria,hematuria,  rash,  arthralgias, visual complaints, headache, numbness, weakness or ataxia or problems with walking or coordination,  change in mood/affect or memory.              Review of Systems     Objective:   Physical Exam    amb chronically ill appearing >> stated age wm unusual affect / congested cough   Wt Readings from Last 3 Encounters:  08/11/17 186 lb (84.4 kg)  08/01/17 190 lb (86.2 kg)  02/21/17 180 lb (81.6 kg)    Vital signs reviewed  - Note on arrival 02 sats  93% on RA     HEENT: nl dentition, turbinates bilaterally, and oropharynx. Nl external ear canals without cough reflex   NECK :  without JVD/Nodes/TM/ nl carotid upstrokes bilaterally   LUNGS: no acc muscle use,  Barrel chest with insp/ exp rhonchi bilaterally   CV:  RRR  no s3 or murmur or increase in P2, and no edema   ABD:  soft and nontender with nl inspiratory excursion in the supine position. No bruits or organomegaly appreciated, bowel sounds nl  MS:  Nl gait/ ext warm without deformities, calf tenderness, cyanosis or clubbing No obvious joint restrictions   SKIN: warm and dry without lesions    NEURO:  alert, approp, nl sensorium with  no motor or cerebellar deficits apparent.      I personally reviewed images and agree with radiology impression as follows:   Chest CT  08/01/17  1.  No acute findings. No pulmonary embolism. No pneumonia or pulmonary edema. 2. Continued interval enlargement of the irregular/spiculated pulmonary mass within the right upper lobe, now measuring 2.5 cm (1.7 cm on earlier CT of 15/40/0867), hypermetabolic on PET-CT of 61/95/0932 suggesting bronchogenic carcinoma. 3. Emphysema.       Assessment:

## 2017-08-11 NOTE — Patient Instructions (Addendum)
Plan A = Automatic = Bevespi Take 2 puffs first thing in am and then another 2 puffs about 12 hours later.   Work on inhaler technique:  relax and gently blow all the way out then take a nice smooth deep breath back in, triggering the inhaler at same time you start breathing in.  Hold for up to 5 seconds if you can. Blow out thru nose. Rinse and gargle with water when done     Plan B = Backup Only use your albuterol (Proair) as a rescue medication to be used if you can't catch your breath by resting or doing a relaxed purse lip breathing pattern.  - The less you use it, the better it will work when you need it. - Ok to use the inhaler up to 2 puffs  every 4 hours if you must but call for appointment if use goes up over your usual need - Don't leave home without it !!  (think of it like the spare tire for your car)   Plan C = Crisis - only use your albuterol nebulizer if you first try Plan B and it fails to help > ok to use the nebulizer up to every 4 hours but if start needing it regularly call for immediate appointment  The key is to stop smoking completely before smoking completely stops you!   Please schedule a follow up office visit in 4 weeks, sooner if needed with pfts on return

## 2017-08-12 ENCOUNTER — Encounter: Payer: Self-pay | Admitting: Cardiology

## 2017-08-15 DIAGNOSIS — J9611 Chronic respiratory failure with hypoxia: Secondary | ICD-10-CM | POA: Insufficient documentation

## 2017-08-15 NOTE — Assessment & Plan Note (Signed)

## 2017-08-15 NOTE — Assessment & Plan Note (Addendum)
Spirometry 08/11/2017  FEV1 1.04 (30%)  Ratio 44 p 11% improvement on no maint rx  - 08/11/2017  After extensive coaching HFA effectiveness =    75% > try bevespi   DDX of  difficult airways management almost all start with A and  include Adherence, Ace Inhibitors, Acid Reflux, Active Sinus Disease, Alpha 1 Antitripsin deficiency, Anxiety masquerading as Airways dz,  ABPA,  Allergy(esp in young), Aspiration (esp in elderly), Adverse effects of meds,  Active smokers, A bunch of PE's (a small clot burden can't cause this syndrome unless there is already severe underlying pulm or vascular dz with poor reserve) plus two Bs  = Bronchiectasis and Beta blocker use..and one C= CHF  Adherence is always the initial "prime suspect" and is a multilayered concern that requires a "trust but verify" approach in every patient - starting with knowing how to use medications, especially inhalers, correctly, keeping up with refills and understanding the fundamental difference between maintenance and prns vs those medications only taken for a very short course and then stopped and not refilled.  - see teaching for hfa/ need to verify using consistently / correctly with each ov - return with all meds in hand using a trust but verify approach to confirm accurate Medication  Reconciliation The principal here is that until we are certain that the  patients are doing what we've asked, it makes no sense to ask them to do more.   Active smoking > see sep a/p  ? Acid (or non-acid) GERD > always difficult to exclude as up to 75% of pts in some series report no assoc GI/ Heartburn symptoms> rec continue  acid suppression and diet restrictions/ reviewed      ? Allergy/ asthmatic component > not really much better on prednisone in terms of symptoms but if not doing well on bevespi consider rechallenging with short course of pred or change to symb/spiriva  ? Beta blocker effects > possible concern on 12.5 mg bid coreg > Strongly prefer  in this setting: Bystolic, the most beta -1  selective Beta blocker available in sample form, with bisoprolol the most selective generic choice  on the market. Consider at next ov    ? chf > note last EF 45% 06/14/16    I had an extended discussion with the patient reviewing all relevant studies completed to date and  lasting 25 minutes of a 40  minute office visit with pt new to me requesting to establish     re  severe non-specific but potentially very serious refractory respiratory symptoms of uncertain and potentially multiple  etiologies.   Each maintenance medication was reviewed in detail including most importantly the difference between maintenance and prns and under what circumstances the prns are to be triggered using an action plan format that is not reflected in the computer generated alphabetically organized AVS.    Please see AVS for specific instructions unique to this office visit that I personally wrote and verbalized to the the pt in detail and then reviewed with pt  by my nurse highlighting any changes in therapy/plan of care  recommended at today's visit.

## 2017-08-15 NOTE — Assessment & Plan Note (Addendum)
On 2lpm hs and prn daytime   Adequate control on present rx, reviewed in detail with pt > no change in rx needed

## 2017-09-09 ENCOUNTER — Telehealth: Payer: Self-pay | Admitting: Internal Medicine

## 2017-09-09 NOTE — Telephone Encounter (Signed)
Correct callback # for PA information: [phone], option 5. Spoke with Threasa Beards at [facility], provided additional clinical information for [facility]. Nothing further needed at this time.

## 2017-09-15 ENCOUNTER — Other Ambulatory Visit: Payer: Self-pay | Admitting: Internal Medicine

## 2017-09-15 DIAGNOSIS — R0600 Dyspnea, unspecified: Secondary | ICD-10-CM

## 2017-09-16 ENCOUNTER — Encounter: Payer: Self-pay | Admitting: Internal Medicine

## 2017-09-16 ENCOUNTER — Ambulatory Visit (INDEPENDENT_AMBULATORY_CARE_PROVIDER_SITE_OTHER): Payer: Medicare Other | Admitting: Internal Medicine

## 2017-09-16 VITALS — BP 130/80 | HR 80 | Ht 70.0 in | Wt 185.0 lb

## 2017-09-16 DIAGNOSIS — J449 Chronic obstructive pulmonary disease, unspecified: Secondary | ICD-10-CM | POA: Diagnosis not present

## 2017-09-16 DIAGNOSIS — J9611 Chronic respiratory failure with hypoxia: Secondary | ICD-10-CM

## 2017-09-16 DIAGNOSIS — F17210 Nicotine dependence, cigarettes, uncomplicated: Secondary | ICD-10-CM | POA: Diagnosis not present

## 2017-09-16 DIAGNOSIS — R0600 Dyspnea, unspecified: Secondary | ICD-10-CM

## 2017-09-16 DIAGNOSIS — R911 Solitary pulmonary nodule: Secondary | ICD-10-CM

## 2017-09-16 LAB — PULMONARY FUNCTION TEST
DL/VA % pred: 53 %
DL/VA: 2.48 ml/min/mmHg/L
DLCO COR % PRED: 45 %
DLCO UNC % PRED: 41 %
DLCO UNC: 13.29 ml/min/mmHg
DLCO cor: 14.76 ml/min/mmHg
FEF 25-75 POST: 0.51 L/s
FEF 25-75 PRE: 0.39 L/s
FEF2575-%Change-Post: 32 %
FEF2575-%PRED-POST: 18 %
FEF2575-%PRED-PRE: 14 %
FEV1-%Change-Post: 14 %
FEV1-%PRED-POST: 30 %
FEV1-%Pred-Pre: 26 %
FEV1-Post: 1.05 L
FEV1-Pre: 0.92 L
FEV1FVC-%Change-Post: 5 %
FEV1FVC-%PRED-PRE: 57 %
FEV6-%Change-Post: 8 %
FEV6-%Pred-Post: 50 %
FEV6-%Pred-Pre: 46 %
FEV6-POST: 2.18 L
FEV6-Pre: 2.01 L
FEV6FVC-%CHANGE-POST: 1 %
FEV6FVC-%PRED-POST: 100 %
FEV6FVC-%Pred-Pre: 99 %
FVC-%CHANGE-POST: 8 %
FVC-%Pred-Post: 50 %
FVC-%Pred-Pre: 46 %
FVC-PRE: 2.13 L
FVC-Post: 2.3 L
POST FEV1/FVC RATIO: 46 %
PRE FEV1/FVC RATIO: 43 %
Post FEV6/FVC ratio: 96 %
Pre FEV6/FVC Ratio: 94 %
RV % pred: 248 %
RV: 5.86 L
TLC % PRED: 117 %
TLC: 8.21 L

## 2017-09-16 MED ORDER — TIOTROPIUM BROMIDE-OLODATEROL 2.5-2.5 MCG/ACT IN AERS: 2.0000 | INHALATION_SPRAY | Freq: Every day | RESPIRATORY_TRACT | 0 refills | Status: DC

## 2017-09-16 MED ORDER — TIOTROPIUM BROMIDE-OLODATEROL 2.5-2.5 MCG/ACT IN AERS: 2.0000 | INHALATION_SPRAY | Freq: Every day | RESPIRATORY_TRACT | 11 refills | Status: DC

## 2017-09-16 NOTE — Assessment & Plan Note (Signed)
PET 04/07/16  14.5 mm right upper lobe pulmonary nodule is hypermetabolic and consistent with neoplasm. No enlarged or metabolically active mediastinal or hilar lymph nodes and no findings for metastatic Disease. - CT chest 08/01/17  2.5 cm RUL nodules  He would be a candidate for localized RT at this point but certainly not resection- may be able to do this s gen anesthesia/ navigational bx though the latter would have higher yield for sure  Discussed in detail all the  indications, usual  risks and alternatives  relative to the benefits with patient who agrees to proceed with conservative f/u as outlined with f/u in 4 weeks to see if breathing better first

## 2017-09-16 NOTE — Progress Notes (Addendum)
Subjective:     Patient ID: Shaun May, male   DOB: 1951-02-16,    MRN: [id number]    Brief patient profile:  50 yowm active smoker with GOLD IV and spiculated nodule in RUL requesting second opinion    History of Present Illness  08/11/2017 1st [facility] office visit/ Shaun May   Chief Complaint  Patient presents with  . Pulmonary Consult    Switching care from Dr Shaun May. Pt c/o increased coughing for at least the past month- hard to produce sputum and when he does it's clear and thick. He gets winded with taking the trash down the driveway.   doe = mb and back flat mild sob x 50 ft = MMRC3 = can't walk 100 yards even at a slow pace at a flat grade s stopping due to sob   Sleeps on 2lpm at bedtime  And uses prn daytime  Cough worse first thing in am / congested sounding but all white at this point rec Plan A = Automatic = Bevespi Take 2 puffs first thing in am and then another 2 puffs about 12 hours later.  Work on inhaler technique:   Plan B = Backup Only use your albuterol (Proair) as a rescue medication Plan C = Crisis - only use your albuterol nebulizer if you first try Plan B         09/16/2017  f/u ov/Shaun May re:  GOLD IV/ still smoking  Chief Complaint  Patient presents with  . Follow-up    Breathing is about the same. He is coughing a bit less and has not needed albuterol inhaler or neb.    still struggling to get back from mb to house but no need for saba "you told me to stop it anyway" Somewhat congested in am but no purulent sputum or hemoptysis.  No obvious day to day or daytime variability or assoc excess/ purulent sputum or mucus plugs or hemoptysis or cp or chest tightness, subjective wheeze or overt sinus or hb symptoms. No unusual exp hx or h/o childhood pna/ asthma or knowledge of premature birth.  Sleeping ok flat without nocturnal  or early am exacerbation  of respiratory  c/o's or need for noct saba. Also denies any obvious fluctuation of symptoms with  weather or environmental changes or other aggravating or alleviating factors except as outlined above   Current Allergies, Complete Past Medical History, Past Surgical History, Family History, and Social History were reviewed in [employer] record.  ROS  The following are not active complaints unless bolded Hoarseness, sore throat, dysphagia, dental problems, itching, sneezing,  nasal congestion or discharge of excess mucus or purulent secretions, ear ache,   fever, chills, sweats, unintended wt loss or wt gain, classically pleuritic or exertional cp,  orthopnea pnd or leg swelling, presyncope, palpitations, abdominal pain, anorexia, nausea, vomiting, diarrhea  or change in bowel habits or change in bladder habits, change in stools or change in urine, dysuria, hematuria,  rash, arthralgias, visual complaints, headache, numbness, weakness or ataxia or problems with walking or coordination,  change in mood/affect or memory.        Current Meds  Medication Sig  . albuterol (PROVENTIL HFA;VENTOLIN HFA) 108 (90 Base) MCG/ACT inhaler Inhale 1-2 puffs into the lungs every 6 (six) hours as needed for wheezing or shortness of breath.  Marland Kitchen albuterol (PROVENTIL) (2.5 MG/3ML) 0.083% nebulizer solution Take 3 mLs (2.5 mg total) by nebulization every 4 (four) hours as needed for wheezing or shortness  of breath.  Marland Kitchen aspirin EC 81 MG tablet Take 81 mg by mouth daily.  Marland Kitchen atorvastatin (LIPITOR) 40 MG tablet TAKE ONE TABLET BY MOUTH ONCE DAILY AT 6PM.  . carvedilol (COREG) 25 MG tablet Take 12.5 mg by mouth 2 (two) times daily with a meal.   . Dextromethorphan-Guaifenesin (MUCINEX DM MAXIMUM STRENGTH PO) Take 1,200 mg by mouth daily.  . furosemide (LASIX) 40 MG tablet TAKE ONE TABLET BY MOUTH TWICE DAILY.  Marland Kitchen guaiFENesin-dextromethorphan (ROBITUSSIN DM) 100-10 MG/5ML syrup Take 5 mLs by mouth every 4 (four) hours as needed for cough.  Marland Kitchen LORazepam (ATIVAN) 2 MG tablet Take 1 tablet by mouth 4 (four)  times daily.   Marland Kitchen oxyCODONE-acetaminophen (PERCOCET/ROXICET) 5-325 MG tablet Take 1 tablet by mouth 4 (four) times daily as needed (pain).  . OXYGEN 2lpm with sleep and exertion if needed AHC  . pantoprazole (PROTONIX) 40 MG tablet Take 1 tablet (40 mg total) by mouth daily before breakfast.  . potassium chloride SA (K-DUR,KLOR-CON) 20 MEQ tablet Take 40 mEq by mouth daily.   Marland Kitchen rOPINIRole (REQUIP) 0.25 MG tablet Take 1-3 tablets by mouth at bedtime.   . [Glycopyrrolate-Formoterol (BEVESPI AEROSPHERE) 9-4.8 MCG/ACT AERO Inhale 2 puffs into the lungs 2 (two) times daily.                 Objective:   Physical Exam    amb chronically ill appearing >>  congested /rattling cough   Wt Readings from Last 3 Encounters:  08/11/17 186 lb (84.4 kg)  08/01/17 190 lb (86.2 kg)  02/21/17 180 lb (81.6 kg)    Vital signs reviewed  - Note on arrival 02 sats  96% on RA     HEENT: nl dentition, turbinates bilaterally, and oropharynx. Nl external ear canals without cough reflex   NECK :  without JVD/Nodes/TM/ nl carotid upstrokes bilaterally   LUNGS: no acc muscle use,  Barrel chest with very Coarse insp/ exp rhonchi bilaterally   CV:  RRR  no s3 or murmur or increase in P2, and no significant peripheral edema   ABD:  soft and nontender with nl inspiratory excursion in the supine position. No bruits or organomegaly appreciated, bowel sounds nl  MS:  Nl gait/ ext warm without deformities, calf tenderness, cyanosis or clubbing No obvious joint restrictions   SKIN: warm and dry without lesions    NEURO:  alert, approp, nl sensorium with  no motor or cerebellar deficits apparent.      I personally reviewed images and agree with radiology impression as follows:   Chest CT  08/01/17  1. No acute findings. No pulmonary embolism. No pneumonia or pulmonary edema. 2. Continued interval enlargement of the irregular/spiculated pulmonary mass within the right upper lobe, now measuring 2.5  cm (1.7 cm on earlier CT of 68/11/7516), hypermetabolic on PET-CT of 00/17/4944 suggesting bronchogenic carcinoma. 3. Emphysema.       Assessment:

## 2017-09-16 NOTE — Progress Notes (Signed)
PFT done today. 

## 2017-09-16 NOTE — Patient Instructions (Addendum)
Plan A = Automatic =  Stiolto 2 pffs each am   Work on inhaler technique:  relax and gently blow all the way out then take a nice smooth deep breath back in, triggering the inhaler at same time you start breathing in.  Hold for up to 5 seconds if you can. Blow out thru nose. Rinse and gargle with water when done      Plan B = Backup Only use your albuterol as a rescue medication to be used if you can't catch your breath by resting or doing a relaxed purse lip breathing pattern.  - The less you use it, the better it will work when you need it. - Ok to use the inhaler up to 2 puffs  every 4 hours if you must but call for appointment if use goes up over your usual need - Don't leave home without it !!  (think of it like the spare tire for your car)   Plan C = Crisis - only use your albuterol nebulizer if you first try Plan B and it fails to help > ok to use the nebulizer up to every 4 hours but if start needing it regularly call for immediate appointment     The key is to stop smoking completely before smoking completely stops you!    I will call you to decide when to do a biopsy of the nodule but want to see your breathing better first Add :  Ov 4 weeks with cxr

## 2017-09-16 NOTE — Assessment & Plan Note (Signed)
-   Spirometry 08/11/2017  FEV1 1.04 (30%)  Ratio 44 p 11% improvement on no maint rx  - 08/11/2017   try bevespi  - PFT's  09/16/2017  FEV1 1.05 (30  % ) ratio 46  p 14 % improvement from saba p bevespi prior to study with DLCO  41/45 % corrects to 53  % for alv volume   - 09/16/2017  After extensive coaching HFA effectiveness =    75% try stiolto as bevespi too expensive  He has very severe airflow but continues to smoke against medical advice Pt is Group B in terms of symptom/risk and laba/lama therefore appropriate rx at this point and can't afford spiriva   I had an extended discussion with the patient reviewing all relevant studies completed to date and  lasting 15 to 20 minutes of a 25 minute visit on the following ongoing concerns:  1) it won't matter what med he choses if he continues to smoke  (see separate a/p)  2) Formulary restrictions will be an ongoing challenge for the forseable future and I would be happy to pick an alternative if the pt will first  provide me a list of them but pt  will need to return here for training for any new device that is required eg dpi vs hfa vs respimat.    In meantime we can always provide samples so the patient never runs out of any needed respiratory medications.   Given samples of stiolto and return for anoro if stiolto not affordable or effective   I had an extended discussion with the patient reviewing all relevant studies completed to date and  lasting 15 to 20 minutes of a 25 minute visit    Each maintenance medication was reviewed in detail including most importantly the difference between maintenance and prns and under what circumstances the prns are to be triggered using an action plan format that is not reflected in the computer generated alphabetically organized AVS.    Please see AVS for specific instructions unique to this visit that I personally wrote and verbalized to the the pt in detail and then reviewed with pt  by my nurse highlighting  any  changes in therapy recommended at today's visit to their plan of care.

## 2017-09-16 NOTE — Assessment & Plan Note (Addendum)
On 2lpm hs and prn daytime  - 09/16/2017   Walked RA  2 laps @ 185 ft each stopped due to  Legs gave out, sats 93%  - ONO RA req 09/16/2017   No longer needs amb 02 and will assess noct needs next

## 2017-09-18 NOTE — Assessment & Plan Note (Signed)
PET 04/07/16  14.5 mm right upper lobe  Post Segment pulmonary nodule is hypermetabolic and consistent with neoplasm. No enlarged or metabolically active mediastinal or hilar lymph nodes and no findings for metastatic Disease. - CT chest 08/01/17  2.5 cm RUL nodule    Not a good candidate for navigational bx as risky to do GA but will discuss options with colleagues - clearly also not candidate for any early intervention/ surgical resection.  Discussed in detail all the  indications, usual  risks and alternatives  relative to the benefits with patient who agrees to proceed with conservative f/u as outlined

## 2017-09-19 ENCOUNTER — Telehealth: Payer: Self-pay | Admitting: *Deleted

## 2017-09-19 NOTE — Telephone Encounter (Signed)
LMTCB

## 2017-09-19 NOTE — Telephone Encounter (Signed)
-----   Message from Tanda Rockers, MD sent at 09/18/2017  6:30 AM EDT ----- Ov first week in Nov with cxr on return to decide re bx

## 2017-09-20 ENCOUNTER — Encounter: Payer: Self-pay | Admitting: Internal Medicine

## 2017-09-20 DIAGNOSIS — J449 Chronic obstructive pulmonary disease, unspecified: Secondary | ICD-10-CM | POA: Diagnosis not present

## 2017-09-20 DIAGNOSIS — R0902 Hypoxemia: Secondary | ICD-10-CM | POA: Diagnosis not present

## 2017-09-23 ENCOUNTER — Other Ambulatory Visit: Payer: Self-pay | Admitting: Cardiovascular Disease

## 2017-09-23 NOTE — Telephone Encounter (Signed)
LMTCB

## 2017-09-26 NOTE — Telephone Encounter (Signed)
LMTCB- also need to inform him ONO still pos so needs to continue 2LPM with sleep

## 2017-09-26 NOTE — Telephone Encounter (Signed)
Pt returning Leslie's call from today.  He can be reach at [phone] or hm [phone] pt will be at [facility] after 4 today.-tr

## 2017-09-27 NOTE — Telephone Encounter (Signed)
ATC his work again and he already left  Called home number and NA, no option to leave a msg

## 2017-09-30 ENCOUNTER — Telehealth: Payer: Self-pay | Admitting: Internal Medicine

## 2017-09-30 ENCOUNTER — Encounter: Payer: Self-pay | Admitting: *Deleted

## 2017-09-30 NOTE — Telephone Encounter (Signed)
Pt returning call about test results.  He can be reach on his cell for next hour and then after 4 at his home number.-tr

## 2017-09-30 NOTE — Telephone Encounter (Signed)
Letter mailed to have him call for results and to schedule appt

## 2017-09-30 NOTE — Telephone Encounter (Signed)
Called and spoke with pt and he is aware that per MW to cont to use the 2 liters of oxygen at bedtime and to come in on 11/2 for appt with MW.

## 2017-10-07 ENCOUNTER — Ambulatory Visit: Payer: Medicare Other | Admitting: Internal Medicine

## 2017-10-11 ENCOUNTER — Encounter: Payer: Self-pay | Admitting: Internal Medicine

## 2017-10-11 ENCOUNTER — Ambulatory Visit (INDEPENDENT_AMBULATORY_CARE_PROVIDER_SITE_OTHER): Payer: Medicare Other | Admitting: Internal Medicine

## 2017-10-11 ENCOUNTER — Ambulatory Visit (INDEPENDENT_AMBULATORY_CARE_PROVIDER_SITE_OTHER)
Admission: RE | Admit: 2017-10-11 | Discharge: 2017-10-11 | Disposition: A | Discharge status: Home / Self Care | Payer: Medicare Other | Source: Ambulatory Visit | Attending: Internal Medicine | Admitting: Internal Medicine

## 2017-10-11 VITALS — BP 140/90 | HR 60 | Ht 70.0 in | Wt 188.0 lb

## 2017-10-11 DIAGNOSIS — J9611 Chronic respiratory failure with hypoxia: Secondary | ICD-10-CM | POA: Diagnosis not present

## 2017-10-11 DIAGNOSIS — F17210 Nicotine dependence, cigarettes, uncomplicated: Secondary | ICD-10-CM | POA: Diagnosis not present

## 2017-10-11 DIAGNOSIS — J449 Chronic obstructive pulmonary disease, unspecified: Secondary | ICD-10-CM

## 2017-10-11 DIAGNOSIS — R911 Solitary pulmonary nodule: Secondary | ICD-10-CM

## 2017-10-11 NOTE — Progress Notes (Signed)
Subjective:     Patient ID: Shaun May, male   DOB: 1951/03/23,    MRN: [id number]    Brief patient profile:  34 yowm active smoker with GOLD IV and spiculated nodule in RUL requesting second opinion    History of Present Illness  08/11/2017 1st [facility] office visit/ Jaimya Feliciano   Chief Complaint  Patient presents with  . Pulmonary Consult    Switching care from Dr Elsworth Soho. Pt c/o increased coughing for at least the past month- hard to produce sputum and when he does it's clear and thick. He gets winded with taking the trash down the driveway.   doe = mb and back flat mild sob x 50 ft = MMRC3 = can't walk 100 yards even at a slow pace at a flat grade s stopping due to sob   Sleeps on 2lpm at bedtime  And uses prn daytime  Cough worse first thing in am / congested sounding but all white at this point rec Plan A = Automatic = Bevespi Take 2 puffs first thing in am and then another 2 puffs about 12 hours later.  Work on inhaler technique:   Plan B = Backup Only use your albuterol (Proair) as a rescue medication Plan C = Crisis - only use your albuterol nebulizer if you first try Plan B      09/16/2017  f/u ov/Eulice Rutledge re:  GOLD IV/ still smoking  Chief Complaint  Patient presents with  . Follow-up    Breathing is about the same. He is coughing a bit less and has not needed albuterol inhaler or neb.    still struggling to get back from mb to house but no need for saba "you told me to stop it anyway" Somewhat congested in am but no purulent sputum or hemoptysis.  rec Plan A = Automatic =  Stiolto 2 pffs each am  Work on inhaler technique:  Plan B = Backup Only use your albuterol as a rescue medication  Plan C = Crisis - only use your albuterol nebulizer if you first try Plan B and it fails to help > ok to use the nebulizer up to every 4 hours but if start needing it regularly call for immediate appointment      10/11/2017  f/u ov/Onalee Steinbach re: GOLD IV / still smoking / only using 02  at hs  Chief Complaint  Patient presents with  . Follow-up    coughing better this week, SOB on activity about the same, sleeps in recliner to help with breathing, O2 at night    Walks across parking lot s [facility] parking then looks for scooter/ slow pace when he does walk anywhere  [facility] = can't walk 100 yards even at a slow pace at a flat grade s stopping due to sob   Some thick white mucus each am, maybe a tbp or two never bloody   No obvious day to day or daytime variability or assoc  purulent sputum or mucus plugs or hemoptysis or cp or chest tightness, subjective wheeze or overt sinus or hb symptoms. No unusual exp hx or h/o childhood pna/ asthma or knowledge of premature birth.  Sleeping ok in recliner at 45 deg/ 2lpm without nocturnal   exacerbation  of respiratory  c/o's or need for noct saba. Also denies any obvious fluctuation of symptoms with weather or environmental changes or other aggravating or alleviating factors except as outlined above   Current Allergies, Complete Past Medical History, Past Surgical  History, Family History, and Social History were reviewed in [employer] record.  ROS  The following are not active complaints unless bolded Hoarseness, sore throat, dysphagia, dental problems, itching, sneezing,  nasal congestion or discharge of excess mucus or purulent secretions, ear ache,   fever, chills, sweats, unintended wt loss or wt gain, classically pleuritic or exertional cp,  orthopnea pnd or leg swelling, presyncope, palpitations, abdominal pain, anorexia, nausea, vomiting, diarrhea  or change in bowel habits or change in bladder habits, change in stools or change in urine, dysuria, hematuria,  rash, arthralgias, visual complaints, headache, numbness, weakness or ataxia or problems with walking or coordination,  change in mood/affect or memory.        Current Meds  Medication Sig  . albuterol (PROVENTIL HFA;VENTOLIN HFA) 108 (90 Base) MCG/ACT inhaler  Inhale 1-2 puffs into the lungs every 6 (six) hours as needed for wheezing or shortness of breath.  Marland Kitchen albuterol (PROVENTIL) (2.5 MG/3ML) 0.083% nebulizer solution Take 3 mLs (2.5 mg total) by nebulization every 4 (four) hours as needed for wheezing or shortness of breath.  Marland Kitchen aspirin EC 81 MG tablet Take 81 mg by mouth daily.  Marland Kitchen atorvastatin (LIPITOR) 40 MG tablet TAKE ONE TABLET BY MOUTH ONCE DAILY AT 6PM.  . carvedilol (COREG) 25 MG tablet Take 12.5 mg by mouth 2 (two) times daily with a meal.   . Dextromethorphan-Guaifenesin (MUCINEX DM MAXIMUM STRENGTH PO) Take 1,200 mg by mouth daily.  . furosemide (LASIX) 40 MG tablet TAKE ONE TABLET BY MOUTH TWICE DAILY.  Marland Kitchen guaiFENesin-dextromethorphan (ROBITUSSIN DM) 100-10 MG/5ML syrup Take 5 mLs by mouth every 4 (four) hours as needed for cough.  Marland Kitchen LORazepam (ATIVAN) 2 MG tablet Take 1 tablet by mouth 4 (four) times daily.   Marland Kitchen oxyCODONE-acetaminophen (PERCOCET/ROXICET) 5-325 MG tablet Take 1 tablet by mouth 4 (four) times daily as needed (pain).  . OXYGEN 2lpm with sleep and exertion if needed AHC  . pantoprazole (PROTONIX) 40 MG tablet Take 1 tablet (40 mg total) by mouth daily before breakfast.  . potassium chloride SA (K-DUR,KLOR-CON) 20 MEQ tablet Take 40 mEq by mouth daily.   Marland Kitchen rOPINIRole (REQUIP) 0.25 MG tablet Take 1-3 tablets by mouth at bedtime.   . Tiotropium Bromide-Olodaterol (STIOLTO RESPIMAT) 2.5-2.5 MCG/ACT AERS Inhale 2 puffs into the lungs daily.                Objective:   Physical Exam    amb chronically ill appearing     10/11/2017      188   08/11/17 186 lb (84.4 kg)  08/01/17 190 lb (86.2 kg)  02/21/17 180 lb (81.6 kg)    Vital signs reviewed  - Note on arrival 02 sats  96% on RA      HEENT: nl dentition, turbinates bilaterally, and oropharynx. Nl external ear canals without cough reflex   NECK :  without JVD/Nodes/TM/ nl carotid upstrokes bilaterally   LUNGS: no acc muscle use,  Barrel chest with very junky   insp/ exp rhonchi bilaterally symmetric    CV:  RRR  no s3 or murmur or increase in P2, and no edema   ABD:  soft and nontender with nl inspiratory excursion in the supine position. No bruits or organomegaly appreciated, bowel sounds nl  MS:  Nl gait/ ext warm without deformities, calf tenderness, cyanosis or clubbing No obvious joint restrictions   SKIN: warm and dry without lesions    NEURO:  alert, approp, nl sensorium with  no motor or cerebellar deficits apparent.       CXR PA and Lateral:   10/11/2017 :    I personally reviewed images  / impression as follows:   No change in RUL density seen best on lateral view in post seg RUL near Major fissure       Assessment:

## 2017-10-11 NOTE — Patient Instructions (Signed)
We will call to schedule a repeat PET scan after the first of the year

## 2017-10-12 ENCOUNTER — Encounter: Payer: Self-pay | Admitting: Internal Medicine

## 2017-10-12 NOTE — Assessment & Plan Note (Signed)
On 2lpm hs and prn daytime  - 09/16/2017   Walked RA  2 laps @ 185 ft each stopped due to  Legs gave out, sats 93%  - ONO RA  09/20/17  desat x 8:28sec at < 89% > continue 2lpm hs   Doing well at hs on 02 though note not able to lie down due to immediate orthopnea even on 02  So sleeping in recliner

## 2017-10-12 NOTE — Assessment & Plan Note (Signed)
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 (see separate a/p)   Seems a little better clinically on the stiolto and for now insurance is covering and approp rx since pt has Pt is Group B in terms of symptom/risk and laba/lama   Best option   I had an extended discussion with the patient reviewing all relevant studies completed to date and  lasting 15 to 20 minutes of a 25 minute visit    Each maintenance medication was reviewed in detail including most importantly the difference between maintenance and prns and under what circumstances the prns are to be triggered using an action plan format that is not reflected in the computer generated alphabetically organized AVS.    Please see AVS for specific instructions unique to this visit that I personally wrote and verbalized to the the pt in detail and then reviewed with pt  by my nurse highlighting any  changes in therapy recommended at today's visit to their plan of care.

## 2017-10-12 NOTE — Assessment & Plan Note (Signed)
PET 04/07/16  14.5 mm right upper lobe  Post Segment pulmonary nodule is hypermetabolic and consistent with neoplasm. No enlarged or metabolically active mediastinal or hilar lymph nodes and no findings for metastatic Disease. - CT chest 08/01/17  2.5 cm RUL nodule   - 10/11/2017   PET rec for 1st of year then ? [place]   Discussed in detail all the  indications, usual  risks and alternatives  relative to the benefits with patient who agrees to proceed with w/u as outlined.   He wants to wait until after the holidays to address this so fine with me / placed in reminder file for recall.

## 2017-10-12 NOTE — Progress Notes (Signed)
D/w pt at ov

## 2017-10-12 NOTE — Assessment & Plan Note (Signed)
>   3 min   Reviewed again: I took an extended  opportunity with this patient to outline the consequences of continued cigarette use  in airway disorders based on all the data we have from the multiple national lung health studies (perfomed over decades at millions of dollars in cost)  indicating that smoking cessation, not choice of inhalers or physicians, is the most important aspect of care.    Not willing to commit to quit at this point

## 2017-10-20 DIAGNOSIS — I10 Essential (primary) hypertension: Secondary | ICD-10-CM | POA: Diagnosis not present

## 2017-10-20 DIAGNOSIS — D509 Iron deficiency anemia, unspecified: Secondary | ICD-10-CM | POA: Diagnosis not present

## 2017-10-24 DIAGNOSIS — Z6826 Body mass index (BMI) 26.0-26.9, adult: Secondary | ICD-10-CM | POA: Diagnosis not present

## 2017-10-24 DIAGNOSIS — D509 Iron deficiency anemia, unspecified: Secondary | ICD-10-CM | POA: Diagnosis not present

## 2017-10-24 DIAGNOSIS — G894 Chronic pain syndrome: Secondary | ICD-10-CM | POA: Diagnosis not present

## 2017-10-24 DIAGNOSIS — F410 Panic disorder [episodic paroxysmal anxiety] without agoraphobia: Secondary | ICD-10-CM | POA: Diagnosis not present

## 2017-10-24 DIAGNOSIS — F419 Anxiety disorder, unspecified: Secondary | ICD-10-CM | POA: Diagnosis not present

## 2017-10-24 DIAGNOSIS — J449 Chronic obstructive pulmonary disease, unspecified: Secondary | ICD-10-CM | POA: Diagnosis not present

## 2017-10-24 DIAGNOSIS — R918 Other nonspecific abnormal finding of lung field: Secondary | ICD-10-CM | POA: Diagnosis not present

## 2017-10-24 DIAGNOSIS — I5032 Chronic diastolic (congestive) heart failure: Secondary | ICD-10-CM | POA: Diagnosis not present

## 2017-10-24 DIAGNOSIS — Z23 Encounter for immunization: Secondary | ICD-10-CM | POA: Diagnosis not present

## 2017-10-24 DIAGNOSIS — I10 Essential (primary) hypertension: Secondary | ICD-10-CM | POA: Diagnosis not present

## 2017-10-24 DIAGNOSIS — Z720 Tobacco use: Secondary | ICD-10-CM | POA: Diagnosis not present

## 2017-10-24 DIAGNOSIS — L723 Sebaceous cyst: Secondary | ICD-10-CM | POA: Diagnosis not present

## 2017-11-01 ENCOUNTER — Ambulatory Visit (INDEPENDENT_AMBULATORY_CARE_PROVIDER_SITE_OTHER): Payer: Medicare Other | Admitting: *Deleted

## 2017-11-01 DIAGNOSIS — I442 Atrioventricular block, complete: Secondary | ICD-10-CM

## 2017-11-02 NOTE — Progress Notes (Signed)
Remote pacemaker transmission.   

## 2017-11-04 ENCOUNTER — Encounter: Payer: Self-pay | Admitting: Cardiology

## 2017-11-04 LAB — CUP PACEART REMOTE DEVICE CHECK
Battery Remaining Longevity: 124 mo
Brady Statistic AP VP Percent: 41 %
Brady Statistic AS VP Percent: 59 %
Brady Statistic AS VS Percent: 0 %
Implantable Lead Implant Date: 20161122
Implantable Lead Location: 753860
Implantable Lead Model: 5076
Implantable Pulse Generator Implant Date: 20161122
Lead Channel Impedance Value: 542 Ohm
Lead Channel Impedance Value: 580 Ohm
Lead Channel Pacing Threshold Amplitude: 0.5 V
Lead Channel Pacing Threshold Pulse Width: 0.4 ms
Lead Channel Sensing Intrinsic Amplitude: 2.8 mV
Lead Channel Setting Pacing Amplitude: 2 V
Lead Channel Setting Pacing Pulse Width: 0.46 ms
Lead Channel Setting Sensing Sensitivity: 4 mV
MDC IDC LEAD IMPLANT DT: 20161122
MDC IDC LEAD LOCATION: 753859
MDC IDC MSMT BATTERY IMPEDANCE: 139 Ohm
MDC IDC MSMT BATTERY VOLTAGE: 2.78 V
MDC IDC MSMT LEADCHNL RA PACING THRESHOLD AMPLITUDE: 0.625 V
MDC IDC MSMT LEADCHNL RA PACING THRESHOLD PULSEWIDTH: 0.4 ms
MDC IDC SESS DTM: 20181127163418
MDC IDC SET LEADCHNL RV PACING AMPLITUDE: 2.5 V
MDC IDC STAT BRADY AP VS PERCENT: 0 %

## 2017-11-10 ENCOUNTER — Other Ambulatory Visit: Payer: Self-pay | Admitting: Internal Medicine

## 2017-11-10 DIAGNOSIS — R911 Solitary pulmonary nodule: Secondary | ICD-10-CM

## 2017-11-10 DIAGNOSIS — J449 Chronic obstructive pulmonary disease, unspecified: Secondary | ICD-10-CM

## 2017-11-11 ENCOUNTER — Other Ambulatory Visit: Payer: Self-pay | Admitting: Internal Medicine

## 2017-11-11 DIAGNOSIS — R911 Solitary pulmonary nodule: Secondary | ICD-10-CM

## 2017-11-18 ENCOUNTER — Ambulatory Visit (INDEPENDENT_AMBULATORY_CARE_PROVIDER_SITE_OTHER): Payer: Medicare Other | Admitting: Cardiovascular Disease

## 2017-11-18 ENCOUNTER — Encounter: Payer: Self-pay | Admitting: Cardiovascular Disease

## 2017-11-18 VITALS — BP 124/66 | HR 65 | Ht 70.0 in | Wt 197.0 lb

## 2017-11-18 DIAGNOSIS — J449 Chronic obstructive pulmonary disease, unspecified: Secondary | ICD-10-CM

## 2017-11-18 DIAGNOSIS — I429 Cardiomyopathy, unspecified: Secondary | ICD-10-CM | POA: Diagnosis not present

## 2017-11-18 DIAGNOSIS — Z720 Tobacco use: Secondary | ICD-10-CM | POA: Diagnosis not present

## 2017-11-18 DIAGNOSIS — I4892 Unspecified atrial flutter: Secondary | ICD-10-CM | POA: Diagnosis not present

## 2017-11-18 DIAGNOSIS — I313 Pericardial effusion (noninflammatory): Secondary | ICD-10-CM | POA: Diagnosis not present

## 2017-11-18 DIAGNOSIS — I5022 Chronic systolic (congestive) heart failure: Secondary | ICD-10-CM

## 2017-11-18 DIAGNOSIS — Z950 Presence of cardiac pacemaker: Secondary | ICD-10-CM

## 2017-11-18 DIAGNOSIS — I3139 Other pericardial effusion (noninflammatory): Secondary | ICD-10-CM

## 2017-11-18 NOTE — Progress Notes (Signed)
SUBJECTIVE: The patient presents for routine follow-up. He has a history of chronic systolic heart failure, nonischemic cardiomyopathy, pacemaker, COPD, alcohol and tobacco abuse, lung nodule, and pericardial effusion.  Echocardiogram 06/14/16 demonstrated mildly reduced left ventricular systolic function, LVEF 09%, inferior and inferoseptal hypokinesis, mildly reduced right ventricular systolic function, and trivial pericardial effusion.  There was no evidence of obstructive coronary artery disease by coronary angiography 03/31/16.  He is doing fairly well overall.  He has chronic exertional dyspnea and has gold stage IV COPD.  He continues to smoke a pack of cigarettes daily and has done so for at least 50 years.  He was found to have a lung nodule and is scheduled a PET scan later this month.  Device interrogation 11/27 showed 3.1% AT/AF burden.  He continues to refuse anticoagulation and has a history of GI bleeding.    Review of Systems: As per "subjective", otherwise negative.  Allergies  Allergen Reactions  . Xarelto [Rivaroxaban] Other (See Comments)    Caused bleeding.     Current Outpatient Medications  Medication Sig Dispense Refill  . albuterol (PROVENTIL HFA;VENTOLIN HFA) 108 (90 Base) MCG/ACT inhaler Inhale 1-2 puffs into the lungs every 6 (six) hours as needed for wheezing or shortness of breath. 1 Inhaler 0  . albuterol (PROVENTIL) (2.5 MG/3ML) 0.083% nebulizer solution Take 3 mLs (2.5 mg total) by nebulization every 4 (four) hours as needed for wheezing or shortness of breath. 75 mL 12  . aspirin EC 81 MG tablet Take 81 mg by mouth daily.    Marland Kitchen atorvastatin (LIPITOR) 40 MG tablet TAKE ONE TABLET BY MOUTH ONCE DAILY AT 6PM. 30 tablet 6  . carvedilol (COREG) 25 MG tablet Take 12.5 mg by mouth 2 (two) times daily with a meal.   11  . Dextromethorphan-Guaifenesin (MUCINEX DM MAXIMUM STRENGTH PO) Take 1,200 mg by mouth daily.    . furosemide (LASIX) 40 MG tablet TAKE  ONE TABLET BY MOUTH TWICE DAILY. 60 tablet 6  . guaiFENesin-dextromethorphan (ROBITUSSIN DM) 100-10 MG/5ML syrup Take 5 mLs by mouth every 4 (four) hours as needed for cough.    Marland Kitchen LORazepam (ATIVAN) 2 MG tablet Take 1 tablet by mouth 4 (four) times daily.     Marland Kitchen oxyCODONE-acetaminophen (PERCOCET/ROXICET) 5-325 MG tablet Take 1 tablet by mouth 4 (four) times daily as needed (pain).    . OXYGEN 2lpm with sleep and exertion if needed AHC    . pantoprazole (PROTONIX) 40 MG tablet Take 1 tablet (40 mg total) by mouth daily before breakfast. 90 tablet 3  . potassium chloride SA (K-DUR,KLOR-CON) 20 MEQ tablet Take 40 mEq by mouth daily.     Marland Kitchen rOPINIRole (REQUIP) 0.25 MG tablet Take 1-3 tablets by mouth at bedtime.   1  . Tiotropium Bromide-Olodaterol (STIOLTO RESPIMAT) 2.5-2.5 MCG/ACT AERS Inhale 2 puffs into the lungs daily. 1 Inhaler 11   No current facility-administered medications for this visit.     Past Medical History:  Diagnosis Date  . Hemochromatosis   . Hypertension   . Lung nodule   . Mobitz type 2 second degree heart block    PPM MDT 10/28/15 Dr. Lovena Le  . Pericardial effusion   . Persistent atrial fibrillation [facility])     Past Surgical History:  Procedure Laterality Date  . BIOPSY  10/13/2016   Procedure: BIOPSY;  Surgeon: Rogene Houston, MD;  [facility];  Service: Endoscopy;;  esophageal  . CARDIAC CATHETERIZATION N/A 03/31/2016   Procedure:  Left Heart Cath and Coronary Angiography;  Surgeon: Wellington Hampshire, MD;  Location: [place] [facility];  Service: Cardiovascular;  Laterality: N/A;  . COLONOSCOPY N/A 10/13/2016   Procedure: COLONOSCOPY;  Surgeon: Rogene Houston, MD;  [facility];  Service: Endoscopy;  Laterality: N/A;  . EP IMPLANTABLE DEVICE N/A 10/28/2015   Procedure: Pacemaker Implant;  Surgeon: Evans Lance, MD;  Location: [place] [facility];  Service: Cardiovascular;  Laterality: N/A;  . EP IMPLANTABLE DEVICE N/A 11/07/2015   Procedure:  Pocket Revision;  Surgeon: Evans Lance, MD;  Location: [place] [facility];  Service: Cardiovascular;  Laterality: N/A;  . ESOPHAGOGASTRODUODENOSCOPY N/A 05/27/2016   Procedure: ESOPHAGOGASTRODUODENOSCOPY (EGD);  Surgeon: Rogene Houston, MD;  [facility];  Service: Endoscopy;  Laterality: N/A;  . ESOPHAGOGASTRODUODENOSCOPY N/A 10/13/2016   Procedure: ESOPHAGOGASTRODUODENOSCOPY (EGD);  Surgeon: Rogene Houston, MD;  [facility];  Service: Endoscopy;  Laterality: N/A;  1:30 - moved to 11/8 @ 1:00 - Ann notified pt  . POLYPECTOMY  10/13/2016   Procedure: POLYPECTOMY;  Surgeon: Rogene Houston, MD;  [facility];  Service: Endoscopy;;  transverse colon polypectomy  . TONSILLECTOMY      Social History   Socioeconomic History  . Marital status: Divorced    Spouse name: Not on file  . Number of children: Not on file  . Years of education: Not on file  . Highest education level: Not on file  Social Needs  . Financial resource strain: Not on file  . Food insecurity - worry: Not on file  . Food insecurity - inability: Not on file  . Transportation needs - medical: Not on file  . Transportation needs - non-medical: Not on file  Occupational History  . Not on file  Tobacco Use  . Smoking status: Current Every Day Smoker    Packs/day: 2.00    Years: 56.00    Pack years: 112.00    Types: E-cigarettes, Cigarettes  . Smokeless tobacco: Never Used  Substance and Sexual Activity  . Alcohol use: Yes    Alcohol/week: 1.8 oz    Types: 3 Shots of liquor per week    Comment: nightly  . Drug use: No  . Sexual activity: Not on file  Other Topics Concern  . Not on file  Social History Narrative  . Not on file     Vitals:   11/18/17 1519  BP: 124/66  Pulse: 65  SpO2: 94%  Weight: 197 lb (89.4 kg)  Height: 5\' 10"  (1.778 m)    Wt Readings from Last 3 Encounters:  11/18/17 197 lb (89.4 kg)  10/11/17 188 lb (85.3 kg)  09/16/17 185 lb (83.9 kg)      PHYSICAL EXAM General: NAD HEENT: Normal. Neck: No JVD, no thyromegaly. Lungs: Poor air movement with diffuse expiratory wheezes. CV: Regular rate and rhythm, normal S1/S2, no S3/S4, no murmur. No pretibial or periankle edema.  No carotid bruit.   Abdomen: Soft, nontender, no distention.  Neurologic: Alert and oriented.  Psych: Normal affect. Skin: Normal. Musculoskeletal: No gross deformities.    ECG: Most recent ECG reviewed.   Labs: Lab Results  Component Value Date/Time   K 3.6 08/01/2017 09:28 AM   BUN 9 08/01/2017 09:28 AM   BUN 8 02/21/2017 03:33 PM   CREATININE 0.86 08/01/2017 09:28 AM   CREATININE 1.07 04/08/2016 02:40 PM   ALT 37 10/07/2016 11:19 AM   TSH 0.755 05/25/2016 10:10 PM  HGB 13.4 08/01/2017 09:28 AM   HGB 13.8 02/21/2017 03:33 PM     Lipids: No results found for: LDLCALC, LDLDIRECT, CHOL, TRIG, HDL     ASSESSMENT AND PLAN: 1. Pericardial effusion: Trivial in size 06/1016.  2. Cardiomyopathy/chronic systolic heart failure: EF 45% 06/14/16. Continue Coreg and Lasix. Euvolemic.  3. Pacemaker: Followed by Dr. Lovena Le.  Most recent device interrogation detailed above.  4. Paroxysmal atrial flutter: On Coreg. Wishes to stay off Xarelto.  Has a history of GI bleeding.  5. Tobacco abuse: Unwilling to quit.  6.  Gold stage IV COPD: He continues to smoke and is unwilling to quit.  He has a lung nodule and is scheduled to undergo a PET scan later this month.      Disposition: Follow up 1 year   Kate Sable, M.D., F.A.C.C.

## 2017-11-18 NOTE — Patient Instructions (Signed)
Your physician wants you to follow-up in:  1 year with dr Virgina Jock will receive a reminder letter in the mail two months in advance. If you don't receive a letter, please call our office to schedule the follow-up appointment.    Your physician recommends that you continue on your current medications as directed. Please refer to the Current Medication list given to you today.     If you need a refill on your cardiac medications before your next appointment, please call your pharmacy.      No testing or lab work ordered today       Thank you for choosing [facility] !

## 2017-11-30 ENCOUNTER — Encounter (HOSPITAL_COMMUNITY)
Admission: RE | Admit: 2017-11-30 | Discharge: 2017-11-30 | Disposition: A | Discharge status: Home / Self Care | Payer: Medicare Other | Source: Ambulatory Visit | Attending: Internal Medicine | Admitting: Internal Medicine

## 2017-11-30 ENCOUNTER — Ambulatory Visit (HOSPITAL_COMMUNITY): Payer: Medicare Other

## 2017-11-30 DIAGNOSIS — R911 Solitary pulmonary nodule: Secondary | ICD-10-CM | POA: Diagnosis not present

## 2017-11-30 LAB — GLUCOSE, CAPILLARY: Glucose-Capillary: 102 mg/dL — ABNORMAL HIGH (ref 65–99)

## 2017-11-30 MED ORDER — FLUDEOXYGLUCOSE F - 18 (FDG) INJECTION
10.1000 | Freq: Once | INTRAVENOUS | Status: AC | PRN
  Administered 2017-11-30: 10.1 via INTRAVENOUS

## 2017-12-01 NOTE — Progress Notes (Signed)
LMTCB on pt's cell  Called home number and NA- no option to leave msg [facility]

## 2017-12-08 ENCOUNTER — Telehealth: Payer: Self-pay | Admitting: Internal Medicine

## 2017-12-08 DIAGNOSIS — R911 Solitary pulmonary nodule: Secondary | ICD-10-CM

## 2017-12-08 NOTE — Telephone Encounter (Signed)
Notes recorded by Tanda Rockers, MD on 12/01/2017 at 5:26 AM EST Call patient : The nodule has continued to increase in size but still quite small > refer to Shaun May as he and I discussed to get several opinions on how best to approach for dx and treatment   Pt is aware of results and voiced his understanding.  Referral has been placed to [place]. Nothing further is needed.

## 2017-12-09 ENCOUNTER — Other Ambulatory Visit (INDEPENDENT_AMBULATORY_CARE_PROVIDER_SITE_OTHER): Payer: Self-pay | Admitting: Internal Medicine

## 2017-12-12 ENCOUNTER — Telehealth: Payer: Self-pay | Admitting: Internal Medicine

## 2017-12-12 ENCOUNTER — Telehealth: Payer: Self-pay | Admitting: *Deleted

## 2017-12-12 NOTE — Telephone Encounter (Signed)
Oncology Nurse Navigator Documentation  Oncology Nurse Navigator Flowsheets 12/12/2017  Navigator Location [facility]  Navigator Encounter Type Telephone/I received referral on Shaun May. I called and scheduled him to be seen at [facility] this week. He verbalized understanding of appt time and place.   Telephone Outgoing Call  Treatment Phase Abnormal Scans  Barriers/Navigation Needs Coordination of Care  Interventions Coordination of Care  Coordination of Care Appts  Acuity Level 1  Time Spent with Patient 15

## 2017-12-12 NOTE — Telephone Encounter (Signed)
Notes recorded by Tanda Rockers, MD on 12/01/2017 at 5:26 AM EST Call patient : The nodule has continued to increase in size but still quite small > refer to [place] as he and I discussed to get several opinions on how best to approach for dx and treatment  Left message for patient to call back.

## 2017-12-12 NOTE — Telephone Encounter (Signed)
Oncology Nurse Navigator Documentation  Oncology Nurse Navigator Flowsheets 12/12/2017  Navigator Location [facility]  Referral date to RadOnc/MedOnc 12/12/2017  Navigator Encounter Type Telephone/I received referral on Mr. Hester Mates today.  I called to schedule him for [place].  I was unable to reach or leave a message.    Treatment Phase Abnormal Scans  Barriers/Navigation Needs Coordination of Care  Interventions Coordination of Care  Coordination of Care Other  Acuity Level 1  Time Spent with Patient 30

## 2017-12-13 NOTE — Telephone Encounter (Signed)
See 12/08/17 phone note.  Pt is aware of recs.

## 2017-12-14 ENCOUNTER — Telehealth: Payer: Self-pay | Admitting: Internal Medicine

## 2017-12-14 NOTE — Telephone Encounter (Signed)
Called patients work on accident they advised to call home number, called home number phone just kept ringing no voice mail picked up

## 2017-12-15 ENCOUNTER — Other Ambulatory Visit: Payer: Self-pay | Admitting: *Deleted

## 2017-12-15 ENCOUNTER — Encounter: Payer: Self-pay | Admitting: General Practice

## 2017-12-15 ENCOUNTER — Encounter: Payer: Self-pay | Admitting: Thoracic Surgery (Cardiothoracic Vascular Surgery)

## 2017-12-15 ENCOUNTER — Ambulatory Visit: Payer: Medicare Other | Attending: Radiation Oncology | Admitting: Physical Therapy

## 2017-12-15 ENCOUNTER — Encounter (INDEPENDENT_AMBULATORY_CARE_PROVIDER_SITE_OTHER): Payer: Self-pay | Admitting: Internal Medicine

## 2017-12-15 ENCOUNTER — Institutional Professional Consult (permissible substitution) (INDEPENDENT_AMBULATORY_CARE_PROVIDER_SITE_OTHER): Payer: Medicare Other | Admitting: Thoracic Surgery (Cardiothoracic Vascular Surgery)

## 2017-12-15 ENCOUNTER — Ambulatory Visit
Admission: RE | Admit: 2017-12-15 | Discharge: 2017-12-15 | Disposition: A | Discharge status: Home / Self Care | Payer: Medicare Other | Source: Ambulatory Visit | Attending: Radiation Oncology | Admitting: Radiation Oncology

## 2017-12-15 VITALS — BP 142/70 | HR 65 | Temp 98.0°F | Resp 18 | Wt 194.6 lb

## 2017-12-15 DIAGNOSIS — R911 Solitary pulmonary nodule: Secondary | ICD-10-CM

## 2017-12-15 DIAGNOSIS — R293 Abnormal posture: Secondary | ICD-10-CM

## 2017-12-15 DIAGNOSIS — D491 Neoplasm of unspecified behavior of respiratory system: Secondary | ICD-10-CM | POA: Diagnosis not present

## 2017-12-15 DIAGNOSIS — R262 Difficulty in walking, not elsewhere classified: Secondary | ICD-10-CM | POA: Diagnosis not present

## 2017-12-15 DIAGNOSIS — F17210 Nicotine dependence, cigarettes, uncomplicated: Secondary | ICD-10-CM | POA: Diagnosis not present

## 2017-12-15 DIAGNOSIS — J449 Chronic obstructive pulmonary disease, unspecified: Secondary | ICD-10-CM | POA: Diagnosis not present

## 2017-12-15 NOTE — Progress Notes (Signed)
[place] Psychosocial Distress Screening Clinical Social Work  Clinical Social Work was referred during Surveyor, quantity.  Clinical Social Worker Edwyna Shell to assess for distress and other psychosocial needs. CSW and patient and his support person discussed common feeling and emotions when being diagnosed with cancer, and the importance of support during treatment. CSW informed patient of the support team and support services at [facility]. CSW provided contact information and encouraged patient to call with any questions or concerns. Per patient, he is worried about his cancer, has experience w relatives w cancer.  Support from friend who will drive him to appointments as she is not currently working.  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  Is concerned about finances and affording gas.  Given referral to [place] for gas card as well as information and application for [employer].  Encouraged to contact CSW w needs/concerns as course of treatment is planned.    Clinical Social Worker follow up needed: Yes.    [place] application when patient completes.    If yes, follow up plan: See above  Edwyna Shell, [place] Worker Phone:  [phone]

## 2017-12-15 NOTE — Progress Notes (Signed)
PCP is Celene Squibb, MD Referring Provider is Tanda Rockers, MD    HPI: [name] is a 67 yo man with a past history of tobacco abuse, GOLD IV COPD, atrial fibrillation, Mobitz II heart block, hypertension, pericardial effusion, and GI bleed.    He has smoked about 2 ppd for 50 years. Currently smoking about 1 ppd. Tried Chantix but had bad dreams.  Found to have a right upper lobe lung nodule in 2017. He was followed and a recent CT showed an increase in size. A PET CT showed the nodule was hypermetabolic. Now referred to [facility] for consideration of biopsy and treatment.   He gets SOB walking to mailbox- about 30 feet up a slight incline. Has to stop to get up a flight of stairs. Uses rescue inhaler 3-4/ week.  Zubrod Score: At the time of surgery this patient's most appropriate activity status/level should be described as: []     0    Normal activity, no symptoms []     1    Restricted in physical strenuous activity but ambulatory, able to do out light work [x]     2    Ambulatory and capable of self care, unable to do work activities, up and about >50 % of waking hours                              []     3    Only limited self care, in bed greater than 50% of waking hours []     4    Completely disabled, no self care, confined to bed or chair []     5    Moribund  Past Medical History:  Diagnosis Date  . Hemochromatosis   . Hypertension   . Lung nodule   . Mobitz type 2 second degree heart block    PPM MDT 10/28/15 Dr. Lovena Le  . Pericardial effusion   . Persistent atrial fibrillation [facility])     Past Surgical History:  Procedure Laterality Date  . BIOPSY  10/13/2016   Procedure: BIOPSY;  Surgeon: Rogene Houston, MD;  [facility];  Service: Endoscopy;;  esophageal  . CARDIAC CATHETERIZATION N/A 03/31/2016   Procedure: Left Heart Cath and Coronary Angiography;  Surgeon: Wellington Hampshire, MD;  Location: [place] [facility];  Service: Cardiovascular;  Laterality: N/A;  .  COLONOSCOPY N/A 10/13/2016   Procedure: COLONOSCOPY;  Surgeon: Rogene Houston, MD;  [facility];  Service: Endoscopy;  Laterality: N/A;  . EP IMPLANTABLE DEVICE N/A 10/28/2015   Procedure: Pacemaker Implant;  Surgeon: Evans Lance, MD;  Location: [place] [facility] LAB;  Service: Cardiovascular;  Laterality: N/A;  . EP IMPLANTABLE DEVICE N/A 11/07/2015   Procedure: Pocket Revision;  Surgeon: Evans Lance, MD;  Location: [place] [facility];  Service: Cardiovascular;  Laterality: N/A;  . ESOPHAGOGASTRODUODENOSCOPY N/A 05/27/2016   Procedure: ESOPHAGOGASTRODUODENOSCOPY (EGD);  Surgeon: Rogene Houston, MD;  [facility];  Service: Endoscopy;  Laterality: N/A;  . ESOPHAGOGASTRODUODENOSCOPY N/A 10/13/2016   Procedure: ESOPHAGOGASTRODUODENOSCOPY (EGD);  Surgeon: Rogene Houston, MD;  [facility];  Service: Endoscopy;  Laterality: N/A;  1:30 - moved to 11/8 @ 1:00 - Ann notified pt  . POLYPECTOMY  10/13/2016   Procedure: POLYPECTOMY;  Surgeon: Rogene Houston, MD;  [facility];  Service: Endoscopy;;  transverse colon polypectomy  . TONSILLECTOMY  Family History  Problem Relation Age of Onset  . Diabetes Other   . Dementia Mother     Social History Social History   Tobacco Use  . Smoking status: Current Every Day Smoker    Packs/day: 2.00    Years: 56.00    Pack years: 112.00    Types: E-cigarettes, Cigarettes  . Smokeless tobacco: Never Used  Substance Use Topics  . Alcohol use: Yes    Alcohol/week: 1.8 oz    Types: 3 Shots of liquor per week    Comment: nightly  . Drug use: No    Current Outpatient Medications  Medication Sig Dispense Refill  . albuterol (PROVENTIL HFA;VENTOLIN HFA) 108 (90 Base) MCG/ACT inhaler Inhale 1-2 puffs into the lungs every 6 (six) hours as needed for wheezing or shortness of breath. 1 Inhaler 0  . albuterol (PROVENTIL) (2.5 MG/3ML) 0.083% nebulizer solution Take 3 mLs (2.5 mg total) by nebulization every 4  (four) hours as needed for wheezing or shortness of breath. 75 mL 12  . aspirin EC 81 MG tablet Take 81 mg by mouth daily.    Marland Kitchen atorvastatin (LIPITOR) 40 MG tablet TAKE ONE TABLET BY MOUTH ONCE DAILY AT 6PM. 30 tablet 6  . carvedilol (COREG) 25 MG tablet Take 12.5 mg by mouth 2 (two) times daily with a meal.   11  . Dextromethorphan-Guaifenesin (MUCINEX DM MAXIMUM STRENGTH PO) Take 1,200 mg by mouth daily.    . furosemide (LASIX) 40 MG tablet TAKE ONE TABLET BY MOUTH TWICE DAILY. 60 tablet 6  . guaiFENesin-dextromethorphan (ROBITUSSIN DM) 100-10 MG/5ML syrup Take 5 mLs by mouth every 4 (four) hours as needed for cough.    Marland Kitchen LORazepam (ATIVAN) 2 MG tablet Take 1 tablet by mouth 4 (four) times daily.     Marland Kitchen oxyCODONE-acetaminophen (PERCOCET/ROXICET) 5-325 MG tablet Take 1 tablet by mouth 4 (four) times daily as needed (pain).    . OXYGEN 2lpm with sleep and exertion if needed AHC    . pantoprazole (PROTONIX) 40 MG tablet TAKE 1 TABLET BY MOUTH DAILY BEFORE BREAKFAST. 90 tablet 3  . potassium chloride SA (K-DUR,KLOR-CON) 20 MEQ tablet Take 40 mEq by mouth daily.     Marland Kitchen rOPINIRole (REQUIP) 0.25 MG tablet Take 1-3 tablets by mouth at bedtime.   1  . Tiotropium Bromide-Olodaterol (STIOLTO RESPIMAT) 2.5-2.5 MCG/ACT AERS Inhale 2 puffs into the lungs daily. 1 Inhaler 11   No current facility-administered medications for this visit.     Allergies  Allergen Reactions  . Xarelto [Rivaroxaban] Other (See Comments)    Caused bleeding.     Review of Systems  Constitutional: Positive for fatigue. Negative for appetite change and unexpected weight change.  HENT: Negative for trouble swallowing and voice change.   Respiratory: Positive for cough, shortness of breath and wheezing.   Cardiovascular: Negative for chest pain.  Gastrointestinal: Positive for blood in stool (while on Xarelto).  Musculoskeletal: Positive for arthralgias.  Neurological: Negative for seizures and syncope.  Hematological:  Negative for adenopathy. Does not bruise/bleed easily.    BP (!) 142/70   Pulse 65   Temp 98 F (36.7 C)   Resp 18   Wt 194 lb 9.6 oz (88.3 kg)   SpO2 94%   BMI 27.92 kg/m  Physical Exam  Constitutional: He is oriented to person, place, and time. He appears well-developed and well-nourished. No distress.  HENT:  Head: Normocephalic and atraumatic.  Mouth/Throat: No oropharyngeal exudate.  Eyes: Conjunctivae and EOM are normal. No  scleral icterus.  Neck: Neck supple. No thyromegaly present.  Cardiovascular: Normal rate, regular rhythm and normal heart sounds.  No murmur heard. Pulmonary/Chest: No respiratory distress. He has wheezes. He has no rales.  Barrel chest. Diminished BS bilaterally.  Abdominal: Soft. He exhibits no distension. There is no tenderness.  Musculoskeletal: He exhibits no edema.  Lymphadenopathy:    He has no cervical adenopathy.  Neurological: He is alert and oriented to person, place, and time. No cranial nerve deficit. He exhibits normal muscle tone.  Skin: Skin is warm and dry.  Vitals reviewed.   Diagnostic Tests: CT ANGIOGRAPHY CHEST WITH CONTRAST  TECHNIQUE: Multidetector CT imaging of the chest was performed using the standard protocol during bolus administration of intravenous contrast. Multiplanar CT image reconstructions and MIPs were obtained to evaluate the vascular anatomy.  CONTRAST:  80 cc Isovue 370  COMPARISON:  Chest CTs dated 05/06/2017 and 03/29/2016.  FINDINGS: Cardiovascular: There is no pulmonary embolism identified within the main, lobar or segmental pulmonary artery branches bilaterally.  Scattered atherosclerosis of the normal caliber thoracic aorta. No aortic aneurysm or dissection seen. Heart size is normal. No pericardial effusion. Coronary artery calcifications noted.  Mediastinum/Nodes: No mass or enlarged lymph nodes seen within the mediastinum or perihilar regions. Scattered small lymph nodes within the  mediastinum, similar to previous exam.  Lungs/Pleura: Continued interval enlargement of the irregular/ spiculated mass within the right upper lobe, now measuring 2.5 cm greatest dimension (2.3 cm on previous study of 05/06/2017, 1.7 cm on earlier CT of 03/29/2016.  No new lung nodules or masses. No pneumonia or pulmonary edema. No pleural effusion. Emphysematous changes again noted bilaterally, upper lobe predominant, moderate in degree.  Upper Abdomen: No acute findings. Right renal cyst, also described on earlier PET-CT report of 04/07/2016.  Musculoskeletal: Mild degenerative spurring/ankylosis throughout the slightly kyphotic thoracic spine. No acute or suspicious osseous finding.  Review of the MIP images confirms the above findings.  IMPRESSION: 1. No acute findings. No pulmonary embolism. No pneumonia or pulmonary edema. 2. Continued interval enlargement of the irregular/spiculated pulmonary mass within the right upper lobe, now measuring 2.5 cm (1.7 cm on earlier CT of 96/29/5284), hypermetabolic on PET-CT of 13/24/4010 suggesting bronchogenic carcinoma. 3. Emphysema. 4. Aortic atherosclerosis.  Aortic Atherosclerosis (ICD10-I70.0) and Emphysema (ICD10-J43.9).   Electronically Signed   By: Franki Cabot M.D.   On: 08/01/2017 13:23 NUCLEAR MEDICINE PET SKULL BASE TO THIGH  TECHNIQUE: 10.1 mCi F-18 FDG was injected intravenously. Full-ring PET imaging was performed from the skull base to thigh after the radiotracer. CT data was obtained and used for attenuation correction and anatomic localization.  FASTING BLOOD GLUCOSE:  Value: 102 mg/dl  COMPARISON:  PET-CT 04/07/2016.  Chest CT 08/01/2017 and 03/29/2016.  FINDINGS: NECK  No hypermetabolic cervical lymph nodes are identified.There are no lesions of the pharyngeal mucosal space.  CHEST  There are no hypermetabolic mediastinal, hilar or axillary lymph nodes. The enlarging, lobulated  right upper lobe lesion along the superior aspect of the major fissure remains hypermetabolic with an SUV max of 5.7 (previously 6.8). This nodule now measures 2.4 x 1.7 cm on image 27. On previous PET-CT, this measured 1.7 x 1.2 cm. No other new, enlarging or hypermetabolic lung lesions are identified. There is mild underlying emphysema. Patient has a left subclavian pacemaker and diffuse atherosclerosis of the aorta, great vessels and coronary arteries.  ABDOMEN/PELVIS  There is no hypermetabolic activity within the liver, adrenal glands, spleen or pancreas. There is no hypermetabolic nodal  activity. Mild hepatic steatosis. There are bilateral renal cysts which are devoid of metabolic activity. The prostate gland is mildly enlarged. There is diffuse aortic and branch vessel atherosclerosis.  SKELETON  There is no hypermetabolic activity to suggest osseous metastatic disease.  IMPRESSION: 1. The enlarging right upper lobe pulmonary nodule remains hypermetabolic consistent with bronchogenic carcinoma. 2. No evidence of metastatic disease. 3. Aortic Atherosclerosis (ICD10-I70.0) and Emphysema (ICD10-J43.9).   Electronically Signed   By: Richardean Sale M.D.   On: 11/30/2017 16:25  PFT FVC 2.13 (46%) 2.30 (50%) FEV1 0.92 (26%) 1.05 (30%) DLCO 14.76 (45%)  Impression: 67 yo man with a history of heavy, ongoing tobacco abuse who has an enlarging RUL nodule that is hypermetabolic on PET. 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 It is clinical stage I. Granulomatous disease is also in the differential but is far less likely.  I discussed options for diagnosis and treatment with Mr and Mrs Akkerman. I do not think he is a candidate for an anatomic resection, therefore options are SBRT and wedge resection. I think in hs case SBRT is the better option. Discussed CT guided biopsy v navigational bronch. Yield is equivalent. There is a risk of pneumothorax with  either due to proximity to the fissure. Navigational bronch would allow placement of fiducials to assist with SBRT.  I have discussed the general nature of the procedure with Mr and Mrs Pry. We would plan to do it in the OR under general anesthesia. They understand the risks include but are not limited to death, stroke, MI, DVT/PE, or bleeding, all of which are unlikely. Pneumothorax and failure to make a diagnosis are the two most likely issues.   He accepts the risks and wishes to proceed.  Plan: My office will call to schedule Navigational bronchoscopy and fiducial placement  [name], MD [facility] [phone]

## 2017-12-15 NOTE — Progress Notes (Signed)
Radiation Oncology         [phone] ________________________________  Multidisciplinary Thoracic Oncology Clinic [facility]) Initial Outpatient Consultation  Name: Shaun May MRN: [id number]  Date: 12/15/2017  DOB: 1951/04/22  QP:[id number], Edwinna Areola, MD  Celene Squibb, MD   REFERRING PHYSICIAN: Celene Squibb, MD  DIAGNOSIS: The encounter diagnosis was Pulmonary nodule, right.    ICD-10-CM   1. Pulmonary nodule, right R91.1     HISTORY OF PRESENT ILLNESS::Shaun May is a 67 y.o. male who has been followed by Dr. Melvyn Novas for history of right upper lobe lung nodule with symptoms of shortness of breath and cough associated with severe COPD.  He is a current smoker. He requires supplemental O2 at night and prn throughout the day.  His most recent CT of the chest showed continued interval enlargement of the irregular/spiculated pulmonary mass within the right upper lobe, now measuring 2.5 cm as compared to 1.7 cm previously. This lesion was hypermetabolic on 09/32/6712 PET. No evidence of metastatic disease.    The patient was referred today for presentation in the multidisciplinary conference.  Radiology studies and pathology slides were presented there for review and discussion of treatment options.  A consensus was discussed regarding potential next steps.  PREVIOUS RADIATION THERAPY: No  PAST MEDICAL HISTORY:  has a past medical history of Hemochromatosis, Hypertension, Lung nodule, Mobitz type 2 second degree heart block, Pericardial effusion, and Persistent atrial fibrillation ([place]).    PAST SURGICAL HISTORY: Past Surgical History:  Procedure Laterality Date  . BIOPSY  10/13/2016   Procedure: BIOPSY;  Surgeon: Rogene Houston, MD;  [facility];  Service: Endoscopy;;  esophageal  . CARDIAC CATHETERIZATION N/A 03/31/2016   Procedure: Left Heart Cath and Coronary Angiography;  Surgeon: Wellington Hampshire, MD;  Location: [place] [facility];  Service: Cardiovascular;  Laterality: N/A;   . COLONOSCOPY N/A 10/13/2016   Procedure: COLONOSCOPY;  Surgeon: Rogene Houston, MD;  [facility]: [facility];  Service: Endoscopy;  Laterality: N/A;  . EP IMPLANTABLE DEVICE N/A 10/28/2015   Procedure: Pacemaker Implant;  Surgeon: Evans Lance, MD;  Location: [place] [facility];  Service: Cardiovascular;  Laterality: N/A;  . EP IMPLANTABLE DEVICE N/A 11/07/2015   Procedure: Pocket Revision;  Surgeon: Evans Lance, MD;  Location: [place] [facility];  Service: Cardiovascular;  Laterality: N/A;  . ESOPHAGOGASTRODUODENOSCOPY N/A 05/27/2016   Procedure: ESOPHAGOGASTRODUODENOSCOPY (EGD);  Surgeon: Rogene Houston, MD;  [facility];  Service: Endoscopy;  Laterality: N/A;  . ESOPHAGOGASTRODUODENOSCOPY N/A 10/13/2016   Procedure: ESOPHAGOGASTRODUODENOSCOPY (EGD);  Surgeon: Rogene Houston, MD;  [facility];  Service: Endoscopy;  Laterality: N/A;  1:30 - moved to 11/8 @ 1:00 - Ann notified pt  . POLYPECTOMY  10/13/2016   Procedure: POLYPECTOMY;  Surgeon: Rogene Houston, MD;  [facility]: [facility];  Service: Endoscopy;;  transverse colon polypectomy  . TONSILLECTOMY      FAMILY HISTORY: family history includes Dementia in his mother; Diabetes in his other.  SOCIAL HISTORY:  reports that he has been smoking e-cigarettes and cigarettes.  He has a 112.00 pack-year smoking history. he has never used smokeless tobacco. He reports that he drinks about 1.8 oz of alcohol per week. He reports that he does not use drugs.  ALLERGIES: Xarelto [rivaroxaban]  MEDICATIONS:  Current Outpatient Medications  Medication Sig Dispense Refill  . albuterol (PROVENTIL HFA;VENTOLIN HFA) 108 (90 Base) MCG/ACT inhaler Inhale 1-2 puffs into the lungs  every 6 (six) hours as needed for wheezing or shortness of breath. 1 Inhaler 0  . aspirin EC 81 MG tablet Take 81 mg by mouth daily.    Marland Kitchen atorvastatin (LIPITOR) 40 MG tablet TAKE ONE TABLET BY MOUTH ONCE DAILY AT 6PM. 30 tablet 6  . carvedilol (COREG)  25 MG tablet Take 25 mg by mouth 2 (two) times daily with a meal.   11  . Dextromethorphan-Guaifenesin (MUCINEX DM MAXIMUM STRENGTH) 60-1200 MG TB12 Take 1 tablet by mouth daily.    . furosemide (LASIX) 40 MG tablet TAKE ONE TABLET BY MOUTH TWICE DAILY. 60 tablet 6  . guaiFENesin-dextromethorphan (ROBITUSSIN DM) 100-10 MG/5ML syrup Take 5 mLs by mouth every 8 (eight) hours as needed for cough.     Marland Kitchen LORazepam (ATIVAN) 2 MG tablet Take 2 mg by mouth 2 (two) times daily.     Marland Kitchen oxyCODONE-acetaminophen (PERCOCET/ROXICET) 5-325 MG tablet Take 1 tablet by mouth 3 (three) times daily as needed (pain).     . OXYGEN 2lpm with sleep and exertion if needed [facility]    . pantoprazole (PROTONIX) 40 MG tablet TAKE 1 TABLET BY MOUTH DAILY BEFORE BREAKFAST. 90 tablet 3  . Phenyleph-Doxylamine-DM-APAP (NYQUIL SEVERE COLD/FLU PO) Take 1 Dose by mouth at bedtime.    . potassium chloride SA (K-DUR,KLOR-CON) 20 MEQ tablet Take 20 mEq by mouth daily.     Marland Kitchen rOPINIRole (REQUIP) 0.25 MG tablet Take 0.25-0.75 mg by mouth at bedtime.   1  . sodium chloride (OCEAN) 0.65 % SOLN nasal spray Place 1 spray into both nostrils as needed for congestion.    . Tiotropium Bromide-Olodaterol (STIOLTO RESPIMAT) 2.5-2.5 MCG/ACT AERS Inhale 2 puffs into the lungs daily. 1 Inhaler 11   No current facility-administered medications for this encounter.     REVIEW OF SYSTEMS:  On review of systems, the patient reports that he is doing well overall. He denies any chest pain, hemoptysis, fevers, chills, night sweats, or unintended weight changes. He has severe COPD with chronic productive cough with whitish sputum and shortness of breath on exertion with minimal activity- unchanged recently.  He uses supplemental O2 via [place] at night and prn throughout the day. He continues smoking cigarettes.  He denies any bowel or bladder disturbances, and denies abdominal pain, nausea or vomiting. He denies any new musculoskeletal or joint aches or pains, new skin  lesions or concerns. A complete review of systems is obtained and is otherwise negative.   PHYSICAL EXAM:  Vitals with BMI 12/15/2017  Height   Weight 194 lbs 10 oz  BMI   Systolic 324  Diastolic 70  Pulse 65  Respirations 18   In general this is a well appearing Caucasian man in no acute distress. He is alert and oriented x4 and appropriate throughout the examination. HEENT reveals that the patient is normocephalic, atraumatic. Skin is intact without any evidence of gross lesions. Cardiovascular exam reveals a regular rate and rhythm, no clicks rubs or murmurs are auscultated. He has a barrel chest on inspection and lungs with coarse inspiratory and expiratoy rhonchi bilaterally on auscultation. Lymphatic assessment is performed and does not reveal any adenopathy in the cervical, supraclavicular, axillary, or inguinal chains. Abdomen has active bowel sounds in all quadrants and is intact. The abdomen is soft, non tender, non distended. Lower extremities are negative for pretibial pitting edema, deep calf tenderness, cyanosis or clubbing.  KPS = 100  100 - Normal; no complaints; no evidence of disease. 90   - Able  to carry on normal activity; minor signs or symptoms of disease. 80   - Normal activity with effort; some signs or symptoms of disease. 75   - Cares for self; unable to carry on normal activity or to do active work. 60   - Requires occasional assistance, but is able to care for most of his personal needs. 50   - Requires considerable assistance and frequent medical care. 70   - Disabled; requires special care and assistance. 7   - Severely disabled; hospital admission is indicated although death not imminent. 26   - Very sick; hospital admission necessary; active supportive treatment necessary. 10   - Moribund; fatal processes progressing rapidly. 0     - Dead  Karnofsky DA, Abelmann Fallon, Craver LS and Burchenal [facility] [phone]) The use of the nitrogen mustards in the palliative  treatment of carcinoma: with particular reference to bronchogenic carcinoma Cancer 1 634-56  LABORATORY DATA:  Lab Results  Component Value Date   WBC 7.8 08/01/2017   HGB 13.4 08/01/2017   HCT 41.1 08/01/2017   MCV 99.0 08/01/2017   PLT 156 08/01/2017   Lab Results  Component Value Date   NA 135 08/01/2017   K 3.6 08/01/2017   CL 90 (L) 08/01/2017   CO2 33 (H) 08/01/2017   Lab Results  Component Value Date   ALT 37 10/07/2016   AST 39 10/07/2016   ALKPHOS 59 10/07/2016   BILITOT 1.4 (H) 10/07/2016    PULMONARY FUNCTION TEST:   Recent Review Flowsheet Data    Spirometry Latest Ref Rng & Units 09/16/2017 04/07/2016   FVC-%PRED-PRE % 46 45   FVC-%PRED-POST % 50 51   FEV1-PRE L 0.92 0.94   FEV1-%PRED-PRE % 26 27   FEV1-POST L 1.05 1.04   FEV1-%PRED-POST % 30 30   DLCO [facility] ml/min/mmHg 13.29 8.68      RADIOGRAPHY: Nm Pet Image Restag (ps) Skull Base To Thigh  Result Date: 11/30/2017 CLINICAL DATA:  Subsequent treatment strategy for hypermetabolic right upper lobe pulmonary nodule, enlarging on recent chest CTA. EXAM: NUCLEAR MEDICINE PET SKULL BASE TO THIGH TECHNIQUE: 10.1 mCi F-18 FDG was injected intravenously. Full-ring PET imaging was performed from the skull base to thigh after the radiotracer. CT data was obtained and used for attenuation correction and anatomic localization. FASTING BLOOD GLUCOSE:  Value: 102 mg/dl COMPARISON:  PET-CT 04/07/2016.  Chest CT 08/01/2017 and 03/29/2016. FINDINGS: NECK No hypermetabolic cervical lymph nodes are identified.There are no lesions of the pharyngeal mucosal space. CHEST There are no hypermetabolic mediastinal, hilar or axillary lymph nodes. The enlarging, lobulated right upper lobe lesion along the superior aspect of the major fissure remains hypermetabolic with an SUV max of 5.7 (previously 6.8). This nodule now measures 2.4 x 1.7 cm on image 27. On previous PET-CT, this measured 1.7 x 1.2 cm. No other new, enlarging or hypermetabolic  lung lesions are identified. There is mild underlying emphysema. Patient has a left subclavian pacemaker and diffuse atherosclerosis of the aorta, great vessels and coronary arteries. ABDOMEN/PELVIS There is no hypermetabolic activity within the liver, adrenal glands, spleen or pancreas. There is no hypermetabolic nodal activity. Mild hepatic steatosis. There are bilateral renal cysts which are devoid of metabolic activity. The prostate gland is mildly enlarged. There is diffuse aortic and branch vessel atherosclerosis. SKELETON There is no hypermetabolic activity to suggest osseous metastatic disease. IMPRESSION: 1. The enlarging right upper lobe pulmonary nodule remains hypermetabolic consistent with bronchogenic carcinoma. 2. No evidence of metastatic disease.  3. Aortic Atherosclerosis (ICD10-I70.0) and Emphysema (ICD10-J43.9). Electronically Signed   By: Richardean Sale M.D.   On: 11/30/2017 16:25      IMPRESSION/PLAN:  2.4 cm RUL lung nodule, putative Stage I NSCLC, awaiting final pathology. Today, we talked to the patient and his wife about the findings and workup thus far. We discussed the need for tissue confirmation of the lung mass, either via surgical resection, bronchoscopy or CT guided bx. Due to the patient's underlying lung disease with severe COPD and continued smoking, he is not felt to be an ideal surgical candidate but will meet with Dr. Roxan Hockey this afternoon to discuss this option further.  We discussed the natural history of lung cancer and general treatment, highlighting the role of radiotherapy in the management. We discussed the available radiation techniques, and focused on the details of logistics and delivery. More definitive treatment recommendations will be provided once we have tissue confirmation of disease but at present, we suspect that he will be a candidate for SBRT to the RUL lesion. We reviewed the anticipated acute and late sequelae associated with radiation in this  setting. The patient was encouraged to ask questions that were answered to his satisfaction.   At the end of our conversation, the patient is most interested in proceeding with radiotherapy in the treatment of his lung disease.  He is being scheduled for navigational bronch and fiducial marker placement on 12/22/17.  We will tentatively schedule him for CT SIM on 12/30/17 in anticipation of beginning SBRT to the RUL lesion in the near future unless his final pathology would indicate otherwise.  We spent time face to face with the patient and more than 50% of that time was spent in counseling and/or coordination of care.     Nicholos Johns, PA-C    Tyler Pita, MD  [facility] Direct Dial: [phone]  Fax: [phone] [email or web address]  Skype  [email or web address]  This document serves as a record of services personally performed by Tyler Pita, MD and Freeman Caldron, PA-C. It was created on their behalf by Rae Lips, a trained medical scribe. The creation of this record is based on the scribe's personal observations and the providers' statements to them. This document has been checked and approved by the attending providers.

## 2017-12-15 NOTE — H&P (View-Only) (Signed)
PCP is Celene Squibb, MD Referring Provider is Tanda Rockers, MD    HPI: Shaun May is a 67 yo man with a past history of tobacco abuse, GOLD IV COPD, atrial fibrillation, Mobitz II heart block, hypertension, pericardial effusion, and GI bleed.    He has smoked about 2 ppd for 50 years. Currently smoking about 1 ppd. Tried Chantix but had bad dreams.  Found to have a right upper lobe lung nodule in 2017. He was followed and a recent CT showed an increase in size. A PET CT showed the nodule was hypermetabolic. Now referred to [facility] for consideration of biopsy and treatment.   He gets SOB walking to mailbox- about 30 feet up a slight incline. Has to stop to get up a flight of stairs. Uses rescue inhaler 3-4/ week.  Zubrod Score: At the time of surgery this patient's most appropriate activity status/level should be described as: []     0    Normal activity, no symptoms []     1    Restricted in physical strenuous activity but ambulatory, able to do out light work [x]     2    Ambulatory and capable of self care, unable to do work activities, up and about >50 % of waking hours                              []     3    Only limited self care, in bed greater than 50% of waking hours []     4    Completely disabled, no self care, confined to bed or chair []     5    Moribund  Past Medical History:  Diagnosis Date  . Hemochromatosis   . Hypertension   . Lung nodule   . Mobitz type 2 second degree heart block    PPM MDT 10/28/15 Dr. Lovena Le  . Pericardial effusion   . Persistent atrial fibrillation [facility])     Past Surgical History:  Procedure Laterality Date  . BIOPSY  10/13/2016   Procedure: BIOPSY;  Surgeon: Rogene Houston, MD;  [facility];  Service: Endoscopy;;  esophageal  . CARDIAC CATHETERIZATION N/A 03/31/2016   Procedure: Left Heart Cath and Coronary Angiography;  Surgeon: Wellington Hampshire, MD;  Location: [place] [facility];  Service: Cardiovascular;  Laterality: N/A;  .  COLONOSCOPY N/A 10/13/2016   Procedure: COLONOSCOPY;  Surgeon: Rogene Houston, MD;  [facility];  Service: Endoscopy;  Laterality: N/A;  . EP IMPLANTABLE DEVICE N/A 10/28/2015   Procedure: Pacemaker Implant;  Surgeon: Evans Lance, MD;  Location: [place] CV LAB;  Service: Cardiovascular;  Laterality: N/A;  . EP IMPLANTABLE DEVICE N/A 11/07/2015   Procedure: Pocket Revision;  Surgeon: Evans Lance, MD;  Location: [place] [facility] LAB;  Service: Cardiovascular;  Laterality: N/A;  . ESOPHAGOGASTRODUODENOSCOPY N/A 05/27/2016   Procedure: ESOPHAGOGASTRODUODENOSCOPY (EGD);  Surgeon: Rogene Houston, MD;  [facility];  Service: Endoscopy;  Laterality: N/A;  . ESOPHAGOGASTRODUODENOSCOPY N/A 10/13/2016   Procedure: ESOPHAGOGASTRODUODENOSCOPY (EGD);  Surgeon: Rogene Houston, MD;  [facility];  Service: Endoscopy;  Laterality: N/A;  1:30 - moved to 11/8 @ 1:00 - Ann notified pt  . POLYPECTOMY  10/13/2016   Procedure: POLYPECTOMY;  Surgeon: Rogene Houston, MD;  [facility];  Service: Endoscopy;;  transverse colon polypectomy  . TONSILLECTOMY  Family History  Problem Relation Age of Onset  . Diabetes Other   . Dementia Mother     Social History Social History   Tobacco Use  . Smoking status: Current Every Day Smoker    Packs/day: 2.00    Years: 56.00    Pack years: 112.00    Types: E-cigarettes, Cigarettes  . Smokeless tobacco: Never Used  Substance Use Topics  . Alcohol use: Yes    Alcohol/week: 1.8 oz    Types: 3 Shots of liquor per week    Comment: nightly  . Drug use: No    Current Outpatient Medications  Medication Sig Dispense Refill  . albuterol (PROVENTIL HFA;VENTOLIN HFA) 108 (90 Base) MCG/ACT inhaler Inhale 1-2 puffs into the lungs every 6 (six) hours as needed for wheezing or shortness of breath. 1 Inhaler 0  . albuterol (PROVENTIL) (2.5 MG/3ML) 0.083% nebulizer solution Take 3 mLs (2.5 mg total) by nebulization every 4  (four) hours as needed for wheezing or shortness of breath. 75 mL 12  . aspirin EC 81 MG tablet Take 81 mg by mouth daily.    Marland Kitchen atorvastatin (LIPITOR) 40 MG tablet TAKE ONE TABLET BY MOUTH ONCE DAILY AT 6PM. 30 tablet 6  . carvedilol (COREG) 25 MG tablet Take 12.5 mg by mouth 2 (two) times daily with a meal.   11  . Dextromethorphan-Guaifenesin (MUCINEX DM MAXIMUM STRENGTH PO) Take 1,200 mg by mouth daily.    . furosemide (LASIX) 40 MG tablet TAKE ONE TABLET BY MOUTH TWICE DAILY. 60 tablet 6  . guaiFENesin-dextromethorphan (ROBITUSSIN DM) 100-10 MG/5ML syrup Take 5 mLs by mouth every 4 (four) hours as needed for cough.    Marland Kitchen LORazepam (ATIVAN) 2 MG tablet Take 1 tablet by mouth 4 (four) times daily.     Marland Kitchen oxyCODONE-acetaminophen (PERCOCET/ROXICET) 5-325 MG tablet Take 1 tablet by mouth 4 (four) times daily as needed (pain).    . OXYGEN 2lpm with sleep and exertion if needed [facility]    . pantoprazole (PROTONIX) 40 MG tablet TAKE 1 TABLET BY MOUTH DAILY BEFORE BREAKFAST. 90 tablet 3  . potassium chloride SA (K-DUR,KLOR-CON) 20 MEQ tablet Take 40 mEq by mouth daily.     Marland Kitchen rOPINIRole (REQUIP) 0.25 MG tablet Take 1-3 tablets by mouth at bedtime.   1  . Tiotropium Bromide-Olodaterol (STIOLTO RESPIMAT) 2.5-2.5 MCG/ACT AERS Inhale 2 puffs into the lungs daily. 1 Inhaler 11   No current facility-administered medications for this visit.     Allergies  Allergen Reactions  . Xarelto [Rivaroxaban] Other (See Comments)    Caused bleeding.     Review of Systems  Constitutional: Positive for fatigue. Negative for appetite change and unexpected weight change.  HENT: Negative for trouble swallowing and voice change.   Respiratory: Positive for cough, shortness of breath and wheezing.   Cardiovascular: Negative for chest pain.  Gastrointestinal: Positive for blood in stool (while on Xarelto).  Musculoskeletal: Positive for arthralgias.  Neurological: Negative for seizures and syncope.  Hematological:  Negative for adenopathy. Does not bruise/bleed easily.    BP (!) 142/70   Pulse 65   Temp 98 F (36.7 C)   Resp 18   Wt 194 lb 9.6 oz (88.3 kg)   SpO2 94%   BMI 27.92 kg/m  Physical Exam  Constitutional: He is oriented to person, place, and time. He appears well-developed and well-nourished. No distress.  HENT:  Head: Normocephalic and atraumatic.  Mouth/Throat: No oropharyngeal exudate.  Eyes: Conjunctivae and EOM are normal. No  scleral icterus.  Neck: Neck supple. No thyromegaly present.  Cardiovascular: Normal rate, regular rhythm and normal heart sounds.  No murmur heard. Pulmonary/Chest: No respiratory distress. He has wheezes. He has no rales.  Barrel chest. Diminished BS bilaterally.  Abdominal: Soft. He exhibits no distension. There is no tenderness.  Musculoskeletal: He exhibits no edema.  Lymphadenopathy:    He has no cervical adenopathy.  Neurological: He is alert and oriented to person, place, and time. No cranial nerve deficit. He exhibits normal muscle tone.  Skin: Skin is warm and dry.  Vitals reviewed.   Diagnostic Tests: CT ANGIOGRAPHY CHEST WITH CONTRAST  TECHNIQUE: Multidetector CT imaging of the chest was performed using the standard protocol during bolus administration of intravenous contrast. Multiplanar CT image reconstructions and MIPs were obtained to evaluate the vascular anatomy.  CONTRAST:  80 cc Isovue 370  COMPARISON:  Chest CTs dated 05/06/2017 and 03/29/2016.  FINDINGS: Cardiovascular: There is no pulmonary embolism identified within the main, lobar or segmental pulmonary artery branches bilaterally.  Scattered atherosclerosis of the normal caliber thoracic aorta. No aortic aneurysm or dissection seen. Heart size is normal. No pericardial effusion. Coronary artery calcifications noted.  Mediastinum/Nodes: No mass or enlarged lymph nodes seen within the mediastinum or perihilar regions. Scattered small lymph nodes within the  mediastinum, similar to previous exam.  Lungs/Pleura: Continued interval enlargement of the irregular/ spiculated mass within the right upper lobe, now measuring 2.5 cm greatest dimension (2.3 cm on previous study of 05/06/2017, 1.7 cm on earlier CT of 03/29/2016.  No new lung nodules or masses. No pneumonia or pulmonary edema. No pleural effusion. Emphysematous changes again noted bilaterally, upper lobe predominant, moderate in degree.  Upper Abdomen: No acute findings. Right renal cyst, also described on earlier PET-CT report of 04/07/2016.  Musculoskeletal: Mild degenerative spurring/ankylosis throughout the slightly kyphotic thoracic spine. No acute or suspicious osseous finding.  Review of the MIP images confirms the above findings.  IMPRESSION: 1. No acute findings. No pulmonary embolism. No pneumonia or pulmonary edema. 2. Continued interval enlargement of the irregular/spiculated pulmonary mass within the right upper lobe, now measuring 2.5 cm (1.7 cm on earlier CT of 25/04/3975), hypermetabolic on PET-CT of 73/41/9379 suggesting bronchogenic carcinoma. 3. Emphysema. 4. Aortic atherosclerosis.  Aortic Atherosclerosis (ICD10-I70.0) and Emphysema (ICD10-J43.9).   Electronically Signed   By: Franki Cabot M.D.   On: 08/01/2017 13:23 NUCLEAR MEDICINE PET SKULL BASE TO THIGH  TECHNIQUE: 10.1 mCi F-18 FDG was injected intravenously. Full-ring PET imaging was performed from the skull base to thigh after the radiotracer. CT data was obtained and used for attenuation correction and anatomic localization.  FASTING BLOOD GLUCOSE:  Value: 102 mg/dl  COMPARISON:  PET-CT 04/07/2016.  Chest CT 08/01/2017 and 03/29/2016.  FINDINGS: NECK  No hypermetabolic cervical lymph nodes are identified.There are no lesions of the pharyngeal mucosal space.  CHEST  There are no hypermetabolic mediastinal, hilar or axillary lymph nodes. The enlarging, lobulated  right upper lobe lesion along the superior aspect of the major fissure remains hypermetabolic with an SUV max of 5.7 (previously 6.8). This nodule now measures 2.4 x 1.7 cm on image 27. On previous PET-CT, this measured 1.7 x 1.2 cm. No other new, enlarging or hypermetabolic lung lesions are identified. There is mild underlying emphysema. Patient has a left subclavian pacemaker and diffuse atherosclerosis of the aorta, great vessels and coronary arteries.  ABDOMEN/PELVIS  There is no hypermetabolic activity within the liver, adrenal glands, spleen or pancreas. There is no hypermetabolic nodal  activity. Mild hepatic steatosis. There are bilateral renal cysts which are devoid of metabolic activity. The prostate gland is mildly enlarged. There is diffuse aortic and branch vessel atherosclerosis.  SKELETON  There is no hypermetabolic activity to suggest osseous metastatic disease.  IMPRESSION: 1. The enlarging right upper lobe pulmonary nodule remains hypermetabolic consistent with bronchogenic carcinoma. 2. No evidence of metastatic disease. 3. Aortic Atherosclerosis (ICD10-I70.0) and Emphysema (ICD10-J43.9).   Electronically Signed   By: Richardean Sale M.D.   On: 11/30/2017 16:25  PFT FVC 2.13 (46%) 2.30 (50%) FEV1 0.92 (26%) 1.05 (30%) DLCO 14.76 (45%)  Impression: 67 yo man with a history of heavy, ongoing tobacco abuse who has an enlarging RUL nodule that is hypermetabolic on PET. 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 It is clinical stage I. Granulomatous disease is also in the differential but is far less likely.  I discussed options for diagnosis and treatment with Shaun May. I do not think he is a candidate for an anatomic resection, therefore options are SBRT and wedge resection. I think in hs case SBRT is the better option. Discussed CT guided biopsy v navigational bronch. Yield is equivalent. There is a risk of pneumothorax with  either due to proximity to the fissure. Navigational bronch would allow placement of fiducials to assist with SBRT.  I have discussed the general nature of the procedure with Shaun May. We would plan to do it in the OR under general anesthesia. They understand the risks include but are not limited to death, stroke, MI, DVT/PE, or bleeding, all of which are unlikely. Pneumothorax and failure to make a diagnosis are the two most likely issues.   He accepts the risks and wishes to proceed.  Plan: My office will call to schedule Navigational bronchoscopy and fiducial placement  [name], MD [facility] [phone]

## 2017-12-15 NOTE — Therapy (Signed)
[place], [place], [address] Phone: [phone]   Fax:  [phone]  Physical Therapy Evaluation  Patient Details  Name: Shaun May MRN: [id number] Date of Birth: 19-Jul-1951 Referring Provider: Dr. Tyler Pita   Encounter Date: 12/15/2017  PT End of Session - 12/15/17 1456    Visit Number  1    Number of Visits  1    PT Start Time  0973    PT Stop Time  1435    PT Time Calculation (min)  31 min    Activity Tolerance  Patient tolerated treatment well    Behavior During Therapy  [facility] for tasks assessed/performed       Past Medical History:  Diagnosis Date  . Hemochromatosis   . Hypertension   . Lung nodule   . Mobitz type 2 second degree heart block    PPM MDT 10/28/15 Dr. Lovena Le  . Pericardial effusion   . Persistent atrial fibrillation [facility])     Past Surgical History:  Procedure Laterality Date  . BIOPSY  10/13/2016   Procedure: BIOPSY;  Surgeon: Rogene Houston, MD;  [facility];  Service: Endoscopy;;  esophageal  . CARDIAC CATHETERIZATION N/A 03/31/2016   Procedure: Left Heart Cath and Coronary Angiography;  Surgeon: Wellington Hampshire, MD;  Location: [place] [facility];  Service: Cardiovascular;  Laterality: N/A;  . COLONOSCOPY N/A 10/13/2016   Procedure: COLONOSCOPY;  Surgeon: Rogene Houston, MD;  [facility];  Service: Endoscopy;  Laterality: N/A;  . EP IMPLANTABLE DEVICE N/A 10/28/2015   Procedure: Pacemaker Implant;  Surgeon: Evans Lance, MD;  Location: [place] [facility] LAB;  Service: Cardiovascular;  Laterality: N/A;  . EP IMPLANTABLE DEVICE N/A 11/07/2015   Procedure: Pocket Revision;  Surgeon: Evans Lance, MD;  Location: [place] [facility] LAB;  Service: Cardiovascular;  Laterality: N/A;  . ESOPHAGOGASTRODUODENOSCOPY N/A 05/27/2016   Procedure: ESOPHAGOGASTRODUODENOSCOPY (EGD);  Surgeon: Rogene Houston, MD;  [facility];  Service: Endoscopy;   Laterality: N/A;  . ESOPHAGOGASTRODUODENOSCOPY N/A 10/13/2016   Procedure: ESOPHAGOGASTRODUODENOSCOPY (EGD);  Surgeon: Rogene Houston, MD;  [facility];  Service: Endoscopy;  Laterality: N/A;  1:30 - moved to 11/8 @ 1:00 - Ann notified pt  . POLYPECTOMY  10/13/2016   Procedure: POLYPECTOMY;  Surgeon: Rogene Houston, MD;  [facility];  Service: Endoscopy;;  transverse colon polypectomy  . TONSILLECTOMY      There were no vitals filed for this visit.   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 had been followed with h/o pulmonary nodule since 2017; now nodule has increased in size from 1.7 cm. to 2.5 cm. He has not had biopsy for definitive diagnosis, and MAY have bronchoscopy for this, with fiducial placement for possible XRT.  Current smoker (112 pack-years); on home O2 at night (2 L); CHF; A-flutter; 2nd degree AV block; s/p dual-chamber pacemaker placement November 2016; COPD; HTN; chronic back pain.    Patient Stated Goals  get info from all lung clinic providers    Currently in Pain?  Yes    Pain Score  0-No pain up to 7/10    Pain Location  Back    Pain Orientation  Lower  Pain Descriptors / Indicators  Other (Comment) pulling    Pain Type  Chronic pain    Aggravating Factors   leaning forward    Pain Relieving Factors  oxycodone    Multiple Pain Sites  No         [facility] PT Assessment - 12/15/17 0001      Assessment   Medical Diagnosis  right upper lobe 2.5 cm. nodule with biopsy to date    Referring Provider  Dr. Tyler Pita    Onset Date/Surgical Date  05/06/17 approx. for CT showing increase in size    Hand Dominance  Right    Prior Therapy  none      Precautions   Precautions  Other (comment)    Precaution Comments  cancer precautions      Restrictions   Weight Bearing Restrictions   No      Balance Screen   Has the patient fallen in the past 6 months  No    Has the patient had a decrease in activity level because of a fear of falling?   No says he is careful because feet and leg swell    Is the patient reluctant to leave their home because of a fear of falling?   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, but not since current illness      Cognition   Overall Cognitive Status  Within Functional Limits for tasks assessed      Observation/Other Assessments   Observations  unremarkable other than forward head, rounded shoulder posture in sitting and standing      Coordination   Gross Motor Movements are Fluid and Coordinated  Yes    Fine Motor Movements are Fluid and Coordinated  Yes      Functional Tests   Functional tests  Sit to Stand      Sit to Stand   Comments  5 times in 30 seconds, with mild dyspnea following well below average for age      Posture/Postural Control   Posture/Postural Control  Postural limitations    Postural Limitations  Rounded Shoulders;Forward head significant limitations for both      ROM / Strength   AROM / PROM / Strength  AROM      AROM   Overall AROM Comments  in standing, trunk AROM limited 60% in extension, mildly limited in all other directions no increase in back pain reported with this      Ambulation/Gait   Ambulation/Gait  Yes    Ambulation/Gait  Assistance  6: Modified independent (Device/Increase time) gets SOB; uses scooter in [place] or grocery    Assistive device  None      Balance   Balance Assessed  Yes      Dynamic Standing  Balance   Dynamic Standing - Comments  reaches forward 11 inches in standing, below average for age             Objective measurements completed on examination: See above findings.              PT Education - 12/15/17 1455    Education provided  Yes    Education Details  energy conservation, posture, breathing, walking, CURE article on staying active, "Why exercise?" flyer, Prehab exercise class and PT info    Person(s) Educated  Patient;Other (comment) friend/significant other    Methods  Explanation;Handout    Comprehension  Verbalized understanding            Lung Clinic Goals - 12/15/17 1503      Patient will be able to verbalize understanding of the benefit of exercise to decrease fatigue.   Status  Achieved      Patient will be able to verbalize the importance of posture.   Status  Achieved      Patient will be able to demonstrate diaphragmatic breathing for improved lung function.   Status  Achieved      Patient will be able to verbalize understanding of the role of physical therapy to prevent functional decline and who to contact if physical therapy is needed.   Status  Achieved           Plan - 12/15/17 1457    Clinical Impression Statement  Pleasant gentleman with a lung nodule that has increased in size over the last year; he has not had a biopsy for definitive diagnosis yet.  He may have bronchoscopy with possible fiducial placement should XRT be the treatment of choice. He has significant postural limitations, mobility limitation with shortness of breath.    History and Personal Factors relevant to plan of care:  significant cardiac history; on 2L of O2 at night, COPD, chronic back pain    Clinical Presentation  Evolving    Clinical Presentation due  to:  still undergoing diagnostic workup for possible lung cancer, then will have appropriate treatment    Clinical Decision Making  Moderate    Rehab Potential  Fair    Clinical Impairments Affecting Rehab Potential  significant breathing and cardiac status    PT Frequency  One time visit    PT Treatment/Interventions  Patient/family education    PT Next Visit Plan  None planned at this time, though patient was told about the Prehab exercise class and encouraged to attend.    PT Home Exercise Plan  progressive walking or other exercise, breathing exercise, work on posture    Consulted and Agree with Plan of Care  Patient       Patient will benefit from skilled therapeutic intervention in order to improve the following deficits and impairments:  Cardiopulmonary status limiting activity, Decreased activity tolerance, Decreased endurance, Decreased mobility, Decreased range of motion, Postural dysfunction  Visit Diagnosis: Abnormal posture - Plan: PT plan of care cert/re-cert  Difficulty in walking, not elsewhere classified - Plan: PT plan of care cert/re-cert  Neoplasm of lung - Plan: PT plan of care cert/re-cert     Problem List Patient Active Problem List   Diagnosis Date Noted  . Chronic respiratory failure with hypoxia (HCC)/ nocturnal hypoxemia  08/15/2017  . Symptomatic anemia 05/25/2016  . Anemia 05/25/2016  . Hypokalemia 05/25/2016  . Hyponatremia 05/25/2016  . Atrial flutter ([place]), paroxysmal   . Pericardial effusion   . Acute on chronic systolic (  congestive) heart failure ([place]) 03/29/2016  . 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 Elevated troponin 03/29/2016  . Restless leg syndrome 03/29/2016  . Pulmonary nodule, right 03/29/2016  . Chest pain 03/27/2016  . COPD exacerbation ([place]) 03/27/2016  . Hypertension   . Essential hypertension   . Bradycardia 10/25/2015  . Heart block AV second degree 10/25/2015  . Accelerated hypertension 10/25/2015  .  Hemochromatosis 10/25/2015  . Cigarette smoker 10/25/2015  . Alcohol dependence ([place]) 10/25/2015  . Second degree AV block 10/25/2015    SALISBURY,DONNA 12/15/2017, 3:06 PM  [place], [place], [address] Phone: [phone]   Fax:  [phone]  Name: Shaun May MRN: [id number] Date of Birth: 01/30/1951  Serafina Royals, PT 12/15/17 3:06 PM

## 2017-12-16 ENCOUNTER — Encounter: Payer: Self-pay | Admitting: *Deleted

## 2017-12-16 ENCOUNTER — Encounter: Payer: Self-pay | Admitting: General Practice

## 2017-12-16 ENCOUNTER — Other Ambulatory Visit: Payer: Self-pay | Admitting: *Deleted

## 2017-12-16 DIAGNOSIS — R911 Solitary pulmonary nodule: Secondary | ICD-10-CM

## 2017-12-16 NOTE — Progress Notes (Signed)
[place] Psychosocial Distress Screening Clinical Social Work  Clinical Social Work was referred by distress screening protocol.  The patient scored a 7 on the Psychosocial Distress Thermometer which indicates moderate distress. Clinical Social Worker Edwyna Shell to assess for distress and other psychosocial needs.   ONCBCN DISTRESS SCREENING 12/16/2017  Screening Type Initial Screening  Distress experienced in past week (1-10) 7  Emotional problem type Adjusting to illness;Feeling hopeless;Adjusting to appearance changes  Physical Problem type Breathing;Swollen arms/legs  Referral to clinical social work Yes     Clinical Social Worker follow up needed: Yes.    If yes, follow up plan:  Submit financial assistance applications as appropriate.  Patient will follow up w CSW after review of options.  Edwyna Shell, LCSW Clinical Social Worker Phone:  [phone]

## 2017-12-16 NOTE — Progress Notes (Signed)
Oncology Nurse Navigator Documentation  Oncology Nurse Navigator Flowsheets 12/16/2017  Navigator Location [facility]  Navigator Encounter Type Clinic/MDC/I spoke with patient and wife yesterday at thoracic clinic. Patient has right lung mass suspicious for lung cancer. I gave and explained information on his next steps for DX and possible treatment.  Today, I noticed [name] is scheduled for his bronchoscopy and I updated Rad Onc team.   Abnormal Finding Date 11/30/2017  Confirmed Diagnosis Date 12/22/2017  Multidisiplinary Clinic Date 12/15/2017  Patient Visit Type MedOnc  Treatment Phase Abnormal Scans  Barriers/Navigation Needs Education;Coordination of Care  Education Other  Interventions Coordination of Care  Coordination of Care Other  Acuity Level 2  Time Spent with Patient 30

## 2017-12-19 ENCOUNTER — Telehealth: Payer: Self-pay | Admitting: *Deleted

## 2017-12-19 NOTE — Telephone Encounter (Signed)
CALLED PATIENT TO INFORM OF FU APPT. FOR 12-30-17 @ 1:45 PM AND HIS SIM ON 12-30-17 @ 2 PM, LVM FOR A RETURN CALL

## 2017-12-27 NOTE — Pre-Procedure Instructions (Signed)
Shaun May  12/27/2017      [name], [address] [place] [address] Phone: [phone] Fax: [phone]    Your procedure is scheduled on Thursday, December 29, 2017  Report to [facility] Admitting Entrance "A" at [employer] this number if you have problems the morning of surgery:  [phone]   Remember:  Do not eat food or drink liquids after midnight.  Take these medicines the morning of surgery with A SIP OF WATER: Carvedilol (COREG), LORazepam (ATIVAN), Pantoprazole (PROTONIX), and Tiotropium Bromide-Olodaterol (STIOLTO RESPIMAT) Inhaler. If needed  OxyCODONE-acetaminophen (PERCOCET/ROXICET) for pain, OCEAN Nasal spray, and Albuterol Inhaler for cough or wheezing (Bring with you the day of surgery).  Follow your doctor's instruction regarding Aspirin.  As of today, stop taking all Aspirins, Vitamins, Fish oils, and Herbal medications. Also stop all NSAIDS i.e. Advil, Ibuprofen, Motrin, Aleve, Anaprox, Naproxen, BC and Goody Powders.   Do not wear jewelry.  Do not wear lotions, powders, colognes, or deodorant.  Do not shave 48 hours prior to surgery.  Men may shave face and neck.  Do not bring valuables to the hospital.  [facility] is not responsible for any belongings or valuables.  Contacts, dentures or bridgework may not be worn into surgery.  Leave your suitcase in the car.  After surgery it may be brought to your room.  For patients admitted to the hospital, discharge time will be determined by your treatment team.  Patients discharged the day of surgery will not be allowed to drive home.   Special instructions: [place]- Preparing For Surgery  Before surgery, you can play an important role. Because skin is not sterile, your skin needs to be as free of germs as possible. You can reduce the number of germs on your skin by washing with CHG (chlorahexidine gluconate) Soap before surgery.  CHG is  an antiseptic cleaner which kills germs and bonds with the skin to continue killing germs even after washing.  Please do not use if you have an allergy to CHG or antibacterial soaps. If your skin becomes reddened/irritated stop using the CHG.  Do not shave (including legs and underarms) for at least 48 hours prior to first CHG shower. It is OK to shave your face.  Please follow these instructions carefully.   1. Shower the NIGHT BEFORE SURGERY and the MORNING OF SURGERY with CHG.   2. If you chose to wash your hair, wash your hair first as usual with your normal shampoo.  3. After you shampoo, rinse your hair and body thoroughly to remove the shampoo.  4. Use CHG as you would any other liquid soap. You can apply CHG directly to the skin and wash gently with a scrungie or a clean washcloth.   5. Apply the CHG Soap to your body ONLY FROM THE NECK DOWN.  Do not use on open wounds or open sores. Avoid contact with your eyes, ears, mouth and genitals (private parts). Wash Face and genitals (private parts)  with your normal soap.  6. Wash thoroughly, paying special attention to the area where your surgery will be performed.  7. Thoroughly rinse your body with warm water from the neck down.  8. DO NOT shower/wash with your normal soap after using and rinsing off the CHG Soap.  9. Pat yourself dry with a CLEAN TOWEL.  10. Wear CLEAN PAJAMAS to bed the night before surgery, wear  comfortable clothes the morning of surgery  11. Place CLEAN SHEETS on your bed the night of your first shower and DO NOT SLEEP WITH PETS.  Day of Surgery: Do not apply any deodorants/lotions. Please wear clean clothes to the hospital/surgery center.    Please read over the following fact sheets that you were given. Pain Booklet, Coughing and Deep Breathing and Surgical Site Infection Prevention

## 2017-12-28 ENCOUNTER — Encounter (HOSPITAL_COMMUNITY)
Admission: RE | Admit: 2017-12-28 | Discharge: 2017-12-28 | Disposition: A | Discharge status: Home / Self Care | Payer: Medicare Other | Source: Ambulatory Visit | Attending: Thoracic Surgery (Cardiothoracic Vascular Surgery) | Admitting: Thoracic Surgery (Cardiothoracic Vascular Surgery)

## 2017-12-28 ENCOUNTER — Encounter (HOSPITAL_COMMUNITY): Payer: Self-pay

## 2017-12-28 ENCOUNTER — Ambulatory Visit (HOSPITAL_COMMUNITY)
Admission: RE | Admit: 2017-12-28 | Discharge: 2017-12-28 | Disposition: A | Discharge status: Home / Self Care | Payer: Medicare Other | Source: Ambulatory Visit | Attending: Thoracic Surgery (Cardiothoracic Vascular Surgery) | Admitting: Thoracic Surgery (Cardiothoracic Vascular Surgery)

## 2017-12-28 ENCOUNTER — Other Ambulatory Visit: Payer: Self-pay

## 2017-12-28 DIAGNOSIS — R911 Solitary pulmonary nodule: Secondary | ICD-10-CM | POA: Diagnosis not present

## 2017-12-28 DIAGNOSIS — Z01818 Encounter for other preprocedural examination: Secondary | ICD-10-CM | POA: Diagnosis not present

## 2017-12-28 HISTORY — DX: Anemia, unspecified: D64.9

## 2017-12-28 HISTORY — DX: Chronic obstructive pulmonary disease, unspecified: J44.9

## 2017-12-28 HISTORY — DX: Presence of cardiac pacemaker: Z95.0

## 2017-12-28 HISTORY — DX: Dyspnea, unspecified: R06.00

## 2017-12-28 LAB — CBC
HEMATOCRIT: 41.3 % (ref 39.0–52.0)
Hemoglobin: 13.6 g/dL (ref 13.0–17.0)
MCH: 32.5 pg (ref 26.0–34.0)
MCHC: 32.9 g/dL (ref 30.0–36.0)
MCV: 98.8 fL (ref 78.0–100.0)
Platelets: 194 10*3/uL (ref 150–400)
RBC: 4.18 MIL/uL — AB (ref 4.22–5.81)
RDW: 13.9 % (ref 11.5–15.5)
WBC: 10 10*3/uL (ref 4.0–10.5)

## 2017-12-28 LAB — COMPREHENSIVE METABOLIC PANEL
ALT: 39 U/L (ref 17–63)
AST: 51 U/L — AB (ref 15–41)
Albumin: 4 g/dL (ref 3.5–5.0)
Alkaline Phosphatase: 77 U/L (ref 38–126)
Anion gap: 18 — ABNORMAL HIGH (ref 5–15)
BILIRUBIN TOTAL: 1.8 mg/dL — AB (ref 0.3–1.2)
BUN: 11 mg/dL (ref 6–20)
CALCIUM: 8.6 mg/dL — AB (ref 8.9–10.3)
CO2: 21 mmol/L — ABNORMAL LOW (ref 22–32)
CREATININE: 0.99 mg/dL (ref 0.61–1.24)
Chloride: 98 mmol/L — ABNORMAL LOW (ref 101–111)
Glucose, Bld: 95 mg/dL (ref 65–99)
Potassium: 3.9 mmol/L (ref 3.5–5.1)
Sodium: 137 mmol/L (ref 135–145)
TOTAL PROTEIN: 7.4 g/dL (ref 6.5–8.1)

## 2017-12-28 LAB — PROTIME-INR
INR: 1.15
Prothrombin Time: 14.6 seconds (ref 11.4–15.2)

## 2017-12-28 LAB — APTT: aPTT: 37 seconds — ABNORMAL HIGH (ref 24–36)

## 2017-12-28 NOTE — Progress Notes (Signed)
   12/28/17 1408  OBSTRUCTIVE SLEEP APNEA  Have you ever been diagnosed with sleep apnea through a sleep study? No  Do you snore loudly (loud enough to be heard through closed doors)?  1  Do you often feel tired, fatigued, or sleepy during the daytime (such as falling asleep during driving or talking to someone)? 0  Has anyone observed you stop breathing during your sleep? 0  Do you have, or are you being treated for high blood pressure? 1  BMI more than 35 kg/m2? 0  Age > 50 (1-yes) 1  Neck circumference greater than:Male 16 inches or larger, Male 17inches or larger? 1  Male Gender (Yes=1) 1  Obstructive Sleep Apnea Score 5  Score 5 or greater  Results sent to PCP

## 2017-12-28 NOTE — Progress Notes (Signed)
PCP - Wende Neighbors Cardiologist - Bronson Ing EP- Dr. Lovena Le, PPM form faxed ([employer])  Chest x-ray - 12/28/2017  EKG - 08/01/17 ECHO - 06/22/16 Cardiac Cath - 03/31/16  Aspirin Instructions: Patient takes Aspirin every other day. Pt took Aspirin today, so will not take tomorrow.   Anesthesia review: Cardiac history, stage 4 COPD, pt wears O2 at night. Pt reports feeling like his "normal".   Patient denies shortness of breath, fever, cough and chest pain at PAT appointment   Patient verbalized understanding of instructions that were given to them at the PAT appointment. Patient was also instructed that they will need to review over the PAT instructions again at home before surgery.

## 2017-12-28 NOTE — Progress Notes (Signed)
Anesthesia Chart Review:  Pt is a 67 year old male scheduled for video bronchoscopy with endobronchial navigation, placement of fuducial on 12/29/2017 with Erasmo Leventhal, MD  - PCP is Delphina Cahill, MD - Cardiologist is Kate Sable, MD. Last office visit 11/18/17; 1 year f/u recommended - EP cardiologist is [name], MD. Last office visit 01/31/17. Evaluated 02/21/17 by Dr. Thompson Grayer for possibility of Watchman device, but it appears pt was unable to tolerate trial short course of anticoagulation (which would be required after procedure) due to bleeding.  - Pulmonologist is Christinia Gully, MD  PMH includes: Persistent atrial fibrillation, cardiomyopathy, chronic systolic CHF, HTN, Mobitz type II second-degree heart block, pacemaker (Medtronic; implanted 10/28/15), hemochromatosis, anemia (due to xarelto, no longer on anticoagulation).  Uses O2 at night. Current smoker.  BMI 28.  Medications include: Albuterol, ASA 81 mg, Lipitor, carvedilol, Lasix, Protonix, potassium, stiolto respimat.   BP 137/65   Pulse 92   Temp 36.9 C   Resp 18   Ht 5\' 10"  (1.778 m)   Wt 193 lb 4.8 oz (87.7 kg)   SpO2 96%   BMI 27.74 kg/m   Preoperative labs reviewed.  - CMET pending.  - CBC, PT, PTT acceptable for surgery  CXR 12/28/17:  1.  No acute cardiopulmonary disease. 2. Known right upper lobe pulmonary nodule is again suboptimally evaluated radiographically.  EKG 08/01/17: Atrial-sensed ventricular-paced rhythm  CT angio chest 08/01/17:  1. No acute findings. No pulmonary embolism. No pneumonia or pulmonary edema. 2. Continued interval enlargement of the irregular/spiculated pulmonary mass within the right upper lobe, now measuring 2.5 cm (1.7 cm on earlier CT of 66/59/9357), hypermetabolic on PET-CT of 01/77/9390 suggesting bronchogenic carcinoma. 3. Emphysema. 4. Aortic atherosclerosis  Echo 06/14/16:  - Left ventricle: Apical windows are difficult Ovreall LVEF is approximately 45%  with inferior/inferoseptal hypokinesis. The cavity size was normal. Wall thickness was normal. - Right ventricle: Systolic function was mildly reduced. - Right atrium: The atrium was mildly dilated. - Pericardium, extracardiac: Since previous echo, pericardial effusion is now minimal. A trivial pericardial effusion was identified.  Cardiac cath 03/31/16:  1.no evidence of obstructive coronary artery disease. Left dominant system with very short left main. 2. High normal left ventricular end-diastolic pressure. 3. Mildly reduced LV systolic function with an ejection fraction of 45% with apical akinesis.  If CMET acceptable, I anticipate pt can proceed with surgery as scheduled.   Willeen Cass, FNP-BC [facility] Short Stay Surgical Center/Anesthesiology Phone: [phone] 12/28/2017 4:55 PM

## 2017-12-29 ENCOUNTER — Encounter (HOSPITAL_COMMUNITY): Payer: Self-pay | Admitting: *Deleted

## 2017-12-29 ENCOUNTER — Ambulatory Visit (HOSPITAL_COMMUNITY): Payer: Medicare Other

## 2017-12-29 ENCOUNTER — Ambulatory Visit (HOSPITAL_COMMUNITY): Payer: Medicare Other | Admitting: Emergency Medicine

## 2017-12-29 ENCOUNTER — Telehealth: Payer: Self-pay | Admitting: *Deleted

## 2017-12-29 ENCOUNTER — Ambulatory Visit (HOSPITAL_COMMUNITY)
Admission: RE | Admit: 2017-12-29 | Discharge: 2017-12-29 | Disposition: A | Discharge status: Home / Self Care | Payer: Medicare Other | Source: Ambulatory Visit | Attending: Thoracic Surgery (Cardiothoracic Vascular Surgery) | Admitting: Thoracic Surgery (Cardiothoracic Vascular Surgery)

## 2017-12-29 ENCOUNTER — Ambulatory Visit (HOSPITAL_COMMUNITY): Payer: Medicare Other | Admitting: Certified Registered Nurse Anesthetist

## 2017-12-29 ENCOUNTER — Encounter (HOSPITAL_COMMUNITY)
Admission: RE | Disposition: A | Discharge status: Home / Self Care | Payer: Self-pay | Source: Ambulatory Visit | Attending: Thoracic Surgery (Cardiothoracic Vascular Surgery)

## 2017-12-29 DIAGNOSIS — I110 Hypertensive heart disease with heart failure: Secondary | ICD-10-CM | POA: Diagnosis not present

## 2017-12-29 DIAGNOSIS — J441 Chronic obstructive pulmonary disease with (acute) exacerbation: Secondary | ICD-10-CM | POA: Diagnosis not present

## 2017-12-29 DIAGNOSIS — F17210 Nicotine dependence, cigarettes, uncomplicated: Secondary | ICD-10-CM | POA: Diagnosis not present

## 2017-12-29 DIAGNOSIS — J449 Chronic obstructive pulmonary disease, unspecified: Secondary | ICD-10-CM | POA: Diagnosis not present

## 2017-12-29 DIAGNOSIS — Z79899 Other long term (current) drug therapy: Secondary | ICD-10-CM | POA: Insufficient documentation

## 2017-12-29 DIAGNOSIS — I700 Atherosclerosis of aorta: Secondary | ICD-10-CM | POA: Diagnosis not present

## 2017-12-29 DIAGNOSIS — R911 Solitary pulmonary nodule: Secondary | ICD-10-CM | POA: Diagnosis not present

## 2017-12-29 DIAGNOSIS — I10 Essential (primary) hypertension: Secondary | ICD-10-CM | POA: Insufficient documentation

## 2017-12-29 DIAGNOSIS — Z7982 Long term (current) use of aspirin: Secondary | ICD-10-CM | POA: Diagnosis not present

## 2017-12-29 DIAGNOSIS — I481 Persistent atrial fibrillation: Secondary | ICD-10-CM | POA: Diagnosis not present

## 2017-12-29 DIAGNOSIS — R846 Abnormal cytological findings in specimens from respiratory organs and thorax: Secondary | ICD-10-CM | POA: Diagnosis not present

## 2017-12-29 DIAGNOSIS — I441 Atrioventricular block, second degree: Secondary | ICD-10-CM | POA: Diagnosis not present

## 2017-12-29 DIAGNOSIS — R222 Localized swelling, mass and lump, trunk: Secondary | ICD-10-CM | POA: Diagnosis not present

## 2017-12-29 DIAGNOSIS — I5023 Acute on chronic systolic (congestive) heart failure: Secondary | ICD-10-CM | POA: Diagnosis not present

## 2017-12-29 DIAGNOSIS — Z419 Encounter for procedure for purposes other than remedying health state, unspecified: Secondary | ICD-10-CM

## 2017-12-29 HISTORY — PX: FUDUCIAL PLACEMENT: SHX5083

## 2017-12-29 HISTORY — PX: VIDEO BRONCHOSCOPY WITH ENDOBRONCHIAL NAVIGATION: SHX6175

## 2017-12-29 SURGERY — VIDEO BRONCHOSCOPY WITH ENDOBRONCHIAL NAVIGATION
Anesthesia: General | Site: Chest | Laterality: Right

## 2017-12-29 MED ORDER — SODIUM CHLORIDE 0.9% FLUSH: 3.0000 mL | Freq: Two times a day (BID) | INTRAVENOUS | Status: DC

## 2017-12-29 MED ORDER — ROCURONIUM BROMIDE 10 MG/ML (PF) SYRINGE
PREFILLED_SYRINGE | INTRAVENOUS | Status: AC
  Filled 2017-12-29: qty 5

## 2017-12-29 MED ORDER — ALBUTEROL SULFATE (2.5 MG/3ML) 0.083% IN NEBU
INHALATION_SOLUTION | RESPIRATORY_TRACT | Status: AC
  Administered 2017-12-29: 2.5 mg via RESPIRATORY_TRACT
  Filled 2017-12-29: qty 3

## 2017-12-29 MED ORDER — FENTANYL CITRATE (PF) 250 MCG/5ML IJ SOLN
INTRAMUSCULAR | Status: AC
  Filled 2017-12-29: qty 5

## 2017-12-29 MED ORDER — ONDANSETRON HCL 4 MG/2ML IJ SOLN
INTRAMUSCULAR | Status: AC
  Filled 2017-12-29: qty 2

## 2017-12-29 MED ORDER — ACETAMINOPHEN 325 MG PO TABS: 650.0000 mg | ORAL_TABLET | ORAL | Status: DC | PRN

## 2017-12-29 MED ORDER — FENTANYL CITRATE (PF) 100 MCG/2ML IJ SOLN
25.0000 ug | INTRAMUSCULAR | Status: DC | PRN
Start: 2017-12-29 — End: 2017-12-29

## 2017-12-29 MED ORDER — LIDOCAINE 2% (20 MG/ML) 5 ML SYRINGE
INTRAMUSCULAR | Status: AC
  Filled 2017-12-29: qty 5

## 2017-12-29 MED ORDER — SUGAMMADEX SODIUM 200 MG/2ML IV SOLN
INTRAVENOUS | Status: DC | PRN
  Administered 2017-12-29: 175 mg via INTRAVENOUS

## 2017-12-29 MED ORDER — ACETAMINOPHEN 650 MG RE SUPP: 650.0000 mg | RECTAL | Status: DC | PRN

## 2017-12-29 MED ORDER — EPINEPHRINE PF 1 MG/ML IJ SOLN
INTRAMUSCULAR | Status: AC
  Filled 2017-12-29: qty 1

## 2017-12-29 MED ORDER — SUGAMMADEX SODIUM 200 MG/2ML IV SOLN
INTRAVENOUS | Status: AC
  Filled 2017-12-29: qty 2

## 2017-12-29 MED ORDER — 0.9 % SODIUM CHLORIDE (POUR BTL) OPTIME
TOPICAL | Status: DC | PRN
  Administered 2017-12-29: 1000 mL

## 2017-12-29 MED ORDER — MIDAZOLAM HCL 2 MG/2ML IJ SOLN
INTRAMUSCULAR | Status: AC
  Filled 2017-12-29: qty 2

## 2017-12-29 MED ORDER — LIDOCAINE HCL (CARDIAC) 20 MG/ML IV SOLN
INTRAVENOUS | Status: DC | PRN
  Administered 2017-12-29: 50 mg via INTRAVENOUS

## 2017-12-29 MED ORDER — LACTATED RINGERS IV SOLN
INTRAVENOUS | Status: DC | PRN
  Administered 2017-12-29: 14:00:00 via INTRAVENOUS

## 2017-12-29 MED ORDER — LACTATED RINGERS IV SOLN
INTRAVENOUS | Status: DC
  Administered 2017-12-29: 12:00:00 via INTRAVENOUS

## 2017-12-29 MED ORDER — SODIUM CHLORIDE 0.9% FLUSH
3.0000 mL | INTRAVENOUS | Status: DC | PRN
Start: 2017-12-29 — End: 2017-12-29

## 2017-12-29 MED ORDER — PROPOFOL 10 MG/ML IV BOLUS
INTRAVENOUS | Status: DC | PRN
  Administered 2017-12-29: 200 mg via INTRAVENOUS

## 2017-12-29 MED ORDER — SUCCINYLCHOLINE CHLORIDE 200 MG/10ML IV SOSY
PREFILLED_SYRINGE | INTRAVENOUS | Status: AC
  Filled 2017-12-29: qty 10

## 2017-12-29 MED ORDER — PROPOFOL 10 MG/ML IV BOLUS
INTRAVENOUS | Status: AC
  Filled 2017-12-29: qty 20

## 2017-12-29 MED ORDER — DEXAMETHASONE SODIUM PHOSPHATE 10 MG/ML IJ SOLN
INTRAMUSCULAR | Status: AC
  Filled 2017-12-29: qty 1

## 2017-12-29 MED ORDER — ROCURONIUM BROMIDE 100 MG/10ML IV SOLN
INTRAVENOUS | Status: DC | PRN
  Administered 2017-12-29: 50 mg via INTRAVENOUS

## 2017-12-29 MED ORDER — SODIUM CHLORIDE 0.9 % IV SOLN: 250.0000 mL | INTRAVENOUS | Status: DC | PRN

## 2017-12-29 MED ORDER — EPHEDRINE 5 MG/ML INJ
INTRAVENOUS | Status: AC
  Filled 2017-12-29: qty 10

## 2017-12-29 MED ORDER — ONDANSETRON HCL 4 MG/2ML IJ SOLN
INTRAMUSCULAR | Status: DC | PRN
  Administered 2017-12-29: 4 mg via INTRAVENOUS

## 2017-12-29 MED ORDER — ALBUTEROL SULFATE (2.5 MG/3ML) 0.083% IN NEBU
2.5000 mg | INHALATION_SOLUTION | Freq: Four times a day (QID) | RESPIRATORY_TRACT | Status: DC | PRN
  Administered 2017-12-29: 2.5 mg via RESPIRATORY_TRACT

## 2017-12-29 MED ORDER — PHENYLEPHRINE 40 MCG/ML (10ML) SYRINGE FOR IV PUSH (FOR BLOOD PRESSURE SUPPORT)
PREFILLED_SYRINGE | INTRAVENOUS | Status: AC
  Filled 2017-12-29: qty 10

## 2017-12-29 SURGICAL SUPPLY — 44 items
ADAPTER BRONCH F/PENTAX (ADAPTER) ×6 IMPLANT
BRUSH BIOPSY BRONCH 10 SDTNB (MISCELLANEOUS) IMPLANT
BRUSH BIOPSY BRONCH 10MM SDTNB (MISCELLANEOUS)
BRUSH SUPERTRAX BIOPSY (INSTRUMENTS) IMPLANT
BRUSH SUPERTRAX NDL-TIP CYTO (INSTRUMENTS) ×6 IMPLANT
CANISTER SUCT 3000ML PPV (MISCELLANEOUS) ×3 IMPLANT
CHANNEL WORK EXTEND EDGE 180 (KITS) IMPLANT
CHANNEL WORK EXTEND EDGE 45 (KITS) IMPLANT
CHANNEL WORK EXTEND EDGE 90 (KITS) IMPLANT
CONT SPEC 4OZ CLIKSEAL STRL BL (MISCELLANEOUS) ×6 IMPLANT
COVER BACK TABLE 60X90IN (DRAPES) ×3 IMPLANT
FILTER STRAW FLUID ASPIR (MISCELLANEOUS) IMPLANT
FORCEPS BIOP SUPERTRX PREMAR (INSTRUMENTS) ×3 IMPLANT
GAUZE SPONGE 4X4 12PLY STRL (GAUZE/BANDAGES/DRESSINGS) ×3 IMPLANT
GLOVE BIO SURGEON STRL SZ7 (GLOVE) ×3 IMPLANT
GLOVE SURG SIGNA 7.5 PF LTX (GLOVE) ×3 IMPLANT
GOWN STRL REUS W/ TWL XL LVL3 (GOWN DISPOSABLE) ×1 IMPLANT
GOWN STRL REUS W/TWL XL LVL3 (GOWN DISPOSABLE) ×2
KIT CLEAN ENDO COMPLIANCE (KITS) ×3 IMPLANT
KIT MARKER FIDUCIAL DELIVERY (KITS) ×3 IMPLANT
KIT PROCEDURE EDGE 180 (KITS) IMPLANT
KIT PROCEDURE EDGE 45 (KITS) IMPLANT
KIT PROCEDURE EDGE 90 (KITS) IMPLANT
KIT ROOM TURNOVER OR (KITS) ×3 IMPLANT
MARKER FIDUCIAL SL NIT COIL (Implant Marker) ×9 IMPLANT
MARKER SKIN DUAL TIP RULER LAB (MISCELLANEOUS) ×3 IMPLANT
NEEDLE SUPERTRX PREMARK BIOPSY (NEEDLE) ×3 IMPLANT
NS IRRIG 1000ML POUR BTL (IV SOLUTION) ×3 IMPLANT
OIL SILICONE PENTAX (PARTS (SERVICE/REPAIRS)) ×3 IMPLANT
PAD ARMBOARD 7.5X6 YLW CONV (MISCELLANEOUS) ×6 IMPLANT
PATCHES PATIENT (LABEL) ×9 IMPLANT
SYR 20CC LL (SYRINGE) ×3 IMPLANT
SYR 20ML ECCENTRIC (SYRINGE) ×3 IMPLANT
SYR 30ML LL (SYRINGE) ×3 IMPLANT
SYR 5ML LL (SYRINGE) ×3 IMPLANT
TOWEL GREEN STERILE (TOWEL DISPOSABLE) ×3 IMPLANT
TOWEL GREEN STERILE FF (TOWEL DISPOSABLE) ×3 IMPLANT
TRAP SPECIMEN MUCOUS 40CC (MISCELLANEOUS) ×3 IMPLANT
TUBE CONNECTING 12'X1/4 (SUCTIONS) ×1
TUBE CONNECTING 12X1/4 (SUCTIONS) ×2 IMPLANT
TUBE CONNECTING 20'X1/4 (TUBING) ×2
TUBE CONNECTING 20X1/4 (TUBING) ×4 IMPLANT
UNDERPAD 30X30 (UNDERPADS AND DIAPERS) ×3 IMPLANT
WATER STERILE IRR 1000ML POUR (IV SOLUTION) ×3 IMPLANT

## 2017-12-29 NOTE — Interval H&P Note (Signed)
History and Physical Interval Note:  12/29/2017 2:31 PM  Shaun May  has presented today for surgery, with the diagnosis of RUL NODULE  The various methods of treatment have been discussed with the patient and family. After consideration of risks, benefits and other options for treatment, the patient has consented to  Procedure(s): [place] (N/A) PLACEMENT OF FUDUCIAL (N/A) as a surgical intervention .  The patient's history has been reviewed, patient examined, no change in status, stable for surgery.  I have reviewed the patient's chart and labs.  Questions were answered to the patient's satisfaction.     [name]

## 2017-12-29 NOTE — Progress Notes (Signed)
[employer] confirmed date and time of surgery.

## 2017-12-29 NOTE — Anesthesia Procedure Notes (Signed)
Procedure Name: Intubation Date/Time: 12/29/2017 3:25 PM Performed by: Eligha Bridegroom, CRNA Pre-anesthesia Checklist: Patient identified, Emergency Drugs available, Suction available, Patient being monitored and Timeout performed Patient Re-evaluated:Patient Re-evaluated prior to induction Oxygen Delivery Method: Circle system utilized Preoxygenation: Pre-oxygenation with 100% oxygen Induction Type: IV induction Ventilation: Mask ventilation without difficulty Laryngoscope Size: Mac and 4 Grade View: Grade III Tube type: Oral Tube size: 8.5 mm Number of attempts: 3 Airway Equipment and Method: Stylet and Video-laryngoscopy Placement Confirmation: ETT inserted through vocal cords under direct vision,  positive ETCO2 and breath sounds checked- equal and bilateral Secured at: 23 cm Tube secured with: Tape Dental Injury: Teeth and Oropharynx as per pre-operative assessment  Difficulty Due To: Difficulty was anticipated, Difficult Airway- due to anterior larynx and Difficult Airway- due to reduced neck mobility Future Recommendations: Recommend- induction with short-acting agent, and alternative techniques readily available

## 2017-12-29 NOTE — Anesthesia Preprocedure Evaluation (Addendum)
Anesthesia Evaluation  Patient identified by MRN, date of birth, ID band Patient awake    Reviewed: Allergy & Precautions, H&P , Patient's Chart, lab work & pertinent test results, reviewed documented beta blocker date and time   Airway Mallampati: II  TM Distance: >3 FB Neck ROM: full    Dental no notable dental hx.    Pulmonary COPD, Current Smoker,     + wheezing      Cardiovascular hypertension, + pacemaker  Rhythm:regular Rate:Normal     Neuro/Psych    GI/Hepatic   Endo/Other    Renal/GU      Musculoskeletal   Abdominal   Peds  Hematology   Anesthesia Other Findings  LVEF is approximately 45% with inferior/inferoseptal hypokinesis  Diffuse wheezing; he'll use inhaler now x 4 puffs  Pacer to fixed at 80 pre-op  Reproductive/Obstetrics                            Anesthesia Physical Anesthesia Plan  ASA: III  Anesthesia Plan: General   Post-op Pain Management:    Induction: Intravenous  PONV Risk Score and Plan:   Airway Management Planned: Oral ETT  Additional Equipment:   Intra-op Plan:   Post-operative Plan: Extubation in OR  Informed Consent: I have reviewed the patients History and Physical, chart, labs and discussed the procedure including the risks, benefits and alternatives for the proposed anesthesia with the patient or authorized representative who has indicated his/her understanding and acceptance.   Dental Advisory Given  Plan Discussed with: CRNA and Surgeon  Anesthesia Plan Comments: (  )        Anesthesia Quick Evaluation

## 2017-12-29 NOTE — Transfer of Care (Signed)
Immediate Anesthesia Transfer of Care Note  Patient: Shaun May  Procedure(s) Performed: VIDEO BRONCHOSCOPY WITH ENDOBRONCHIAL NAVIGATION (Right Chest) PLACEMENT OF FUDUCIAL (Right Chest)  Patient Location: PACU  Anesthesia Type:General  Level of Consciousness: awake, alert  and oriented  Airway & Oxygen Therapy: Patient Spontanous Breathing and Patient connected to face mask oxygen  Post-op Assessment: Report given to RN and Post -op Vital signs reviewed and stable  Post vital signs: Reviewed and stable  Last Vitals:  Vitals:   12/29/17 1059 12/29/17 1655  BP: (!) 159/76 (!) 160/79  Pulse: 60 80  Resp: 18 (!) 22  Temp: 36.9 C 36.6 C  SpO2: 96% 96%    Last Pain:  Vitals:   12/29/17 1655  TempSrc:   PainSc: 0-No pain      Patients Stated Pain Goal: 3 (64/35/39 1225)  Complications: No apparent anesthesia complications

## 2017-12-29 NOTE — Anesthesia Postprocedure Evaluation (Signed)
Anesthesia Post Note  Patient: Shaun May  Procedure(s) Performed: VIDEO BRONCHOSCOPY WITH ENDOBRONCHIAL NAVIGATION (Right Chest) PLACEMENT OF FUDUCIAL (Right Chest)     Patient location during evaluation: PACU Anesthesia Type: General Level of consciousness: awake Pain management: pain level controlled Vital Signs Assessment: post-procedure vital signs reviewed and stable Respiratory status: spontaneous breathing Cardiovascular status: stable Anesthetic complications: no    Last Vitals:  Vitals:   12/29/17 1755 12/29/17 1815  BP: 136/69 131/70  Pulse: 79 74  Resp: 17 (!) 21  Temp:  36.7 C  SpO2: 91% 92%    Last Pain:  Vitals:   12/29/17 1655  TempSrc:   PainSc: 0-No pain                 [name]

## 2017-12-29 NOTE — Telephone Encounter (Signed)
CALLED PATIENT TO INFORM THAT FU AND SIM HAVE BEEN CANCELLED FOR 12-30-17 AND MOVED TO 01-06-18, SPOKE WITH PATIENT'S SGO- KAREN MCCOLLUM AND SHE IS AWARE OF THESE APPTS.

## 2017-12-29 NOTE — Discharge Instructions (Addendum)
Do not drive or engage in heavy physical activity today  You may resume normal activities tomorrow  You may cough up small amounts of blood over the next few days.  You may use acetaminophen (Tylenol) if needed for minor discomfort.  You may use the cough medication you have as directed  Follow up as scheduled with Dr. Tammi Klippel  Call [phone] if you develop chest pain, shortness of breath, fever > 101F or cough up more than 2 tablespoons of blood.

## 2017-12-29 NOTE — Brief Op Note (Signed)
12/29/2017  4:35 PM  PATIENT:  Shaun May  67 y.o. male  PRE-OPERATIVE DIAGNOSIS:  RUL NODULE  POST-OPERATIVE DIAGNOSIS:  Right upper lobe nodule.  PROCEDURE:  Procedure(s): VIDEO BRONCHOSCOPY WITH ENDOBRONCHIAL NAVIGATION (Right) PLACEMENT OF FUDUCIAL (Right) Needle aspirations, brushings and transbronchial biopsies  SURGEON:  Surgeon(s) and Role:    * [name], MD - Primary  PHYSICIAN ASSISTANT:   ASSISTANTS: none   ANESTHESIA:   none  EBL:  minimal  BLOOD ADMINISTERED:none  DRAINS: none   LOCAL MEDICATIONS USED:  NONE  SPECIMEN:  Source of Specimen:  RUL nodule  DISPOSITION OF SPECIMEN:  PATHOLOGY  COUNTS:  NO endoscopic  TOURNIQUET:  * No tourniquets in log *  DICTATION: .Other Dictation: Dictation Number -  PLAN OF CARE: Discharge to home after PACU  PATIENT DISPOSITION:  PACU - hemodynamically stable.   Delay start of Pharmacological VTE agent (>24hrs) due to surgical blood loss or risk of bleeding: not applicable

## 2017-12-30 ENCOUNTER — Encounter (HOSPITAL_COMMUNITY): Payer: Self-pay | Admitting: Thoracic Surgery (Cardiothoracic Vascular Surgery)

## 2017-12-30 ENCOUNTER — Ambulatory Visit: Payer: Medicare Other | Admitting: Radiation Oncology

## 2017-12-30 ENCOUNTER — Inpatient Hospital Stay: Admission: RE | Admit: 2017-12-30 | Payer: Self-pay | Source: Ambulatory Visit | Admitting: Radiation Oncology

## 2017-12-30 NOTE — Op Note (Signed)
NAME:  Shaun May, Shaun May NO.:  [id number]  MEDICAL RECORD NO.:  [id number]  LOCATION:                                 FACILITY:  PHYSICIAN:  Revonda Standard. Roxan Hockey, M.D. DATE OF BIRTH:  DATE OF PROCEDURE:  12/29/2017 DATE OF DISCHARGE:                              OPERATIVE REPORT   PREOPERATIVE DIAGNOSIS:  Right upper lobe nodule.  POSTOPERATIVE DIAGNOSIS:  Right upper lobe nodule.  PROCEDURE:  Electromagnetic navigational bronchoscopy with needle aspirations, brushings, transbronchial biopsies, and fiducial placement.  ASSISTANT:  None.  ANESTHESIA:  General.  FINDINGS:  Navigated to within 8 mm of the nodule, but never with direct alignment to the center of the nodule.  The initial preps were nondiagnostic.  The fiducials were placed per protocol.  CLINICAL NOTE:  Shaun May is a 67 year old man with a history of tobacco abuse and COPD.  He was found to have a right upper lobe lung nodule in 2017.  A recent CT scan showed an increase in size of the nodule and PET/CT showed the nodule was hypermetabolic.  He was referred for biopsy in preparation for stereotactic radiation.  The indications, risks, benefits, and alternatives were discussed in detail with the patient. He understood and accepted the risks and agreed to proceed.  OPERATIVE NOTE:  Shaun May was brought to the operating room on December 29, 2017.  He had induction of general anesthesia and was intubated. Planning for the biopsies was done on the super-dimension computer prior to induction.  Of note, the patient did have very small airways leading to the area of the nodule.  After performing a time-out, flexible fiberoptic bronchoscopy was performed via the endotracheal tube. It revealed normal endobronchial anatomy with no endobronchial lesions.  There were moderate thick clear secretions.  The locatable guide for navigation was placed and registration was performed.  There was good correlation  of the video and virtual bronchoscopy.  The bronchoscope then was positioned at the right upper lobe orifice and the appropriate subsegmental bronchus was cannulated with the locatable guide.  The locatable guide was advanced to within initially 1.3 cm of the nodule and slightly off-line.  Local navigation was performed with fluoroscopy.  There was approximately a 1 cm difference between the location of the nodule with local navigation versus the pre-navigation. Due to difficulty seeing the nodule on the local navigation, it was felt that the original navigation was more likely to be accurate.  The locatable guide ultimately was positioned about 7 to 8 mm from the nodule along its edge.  It was not possible to get alignment with the center of the nodule, but it did appear that the samples were coming from the area of the lesion.  Three needle aspirations were performed. After every third sampling, the locatable guide was reinserted to make sure there was continued good proximity and alignment.  Three needle brushings were performed.  These were sent for quick prep.  Multiple biopsies then were obtained.  The locatable guide again was reinserted and the catheter position was changed to various angles during the sampling.  A total of 12 biopsies were taken.  These were all sent  for permanent pathology.  The locatable guide then was reinserted and navigation for fiducial placement was performed. The computer generated 3 sites for fiducials.  Fiducials were placed at these 3 sites. The locatable guide and sheath were removed.  A final inspection was made.  There was no ongoing bleeding.  The patient was extubated in the operating room.  The quick prep returned showing no tumor.  The patient was taken to the postanesthetic care unit in good condition.     Revonda Standard Roxan Hockey, M.D.     SCH/MEDQ  D:  12/29/2017  T:  12/30/2017  Job:  [id number]

## 2018-01-06 ENCOUNTER — Ambulatory Visit
Admission: RE | Admit: 2018-01-06 | Discharge: 2018-01-06 | Disposition: A | Discharge status: Home / Self Care | Payer: Medicare Other | Source: Ambulatory Visit | Attending: Radiation Oncology | Admitting: Radiation Oncology

## 2018-01-06 ENCOUNTER — Encounter: Payer: Self-pay | Admitting: Radiation Oncology

## 2018-01-06 ENCOUNTER — Other Ambulatory Visit: Payer: Self-pay

## 2018-01-06 VITALS — BP 153/68 | HR 59 | Temp 98.1°F | Resp 18 | Wt 195.4 lb

## 2018-01-06 DIAGNOSIS — Z79899 Other long term (current) drug therapy: Secondary | ICD-10-CM | POA: Diagnosis not present

## 2018-01-06 DIAGNOSIS — R042 Hemoptysis: Secondary | ICD-10-CM | POA: Diagnosis not present

## 2018-01-06 DIAGNOSIS — R079 Chest pain, unspecified: Secondary | ICD-10-CM | POA: Insufficient documentation

## 2018-01-06 DIAGNOSIS — Y842 Radiological procedure and radiotherapy as the cause of abnormal reaction of the patient, or of later complication, without mention of misadventure at the time of the procedure: Secondary | ICD-10-CM | POA: Insufficient documentation

## 2018-01-06 DIAGNOSIS — Z888 Allergy status to other drugs, medicaments and biological substances status: Secondary | ICD-10-CM | POA: Diagnosis not present

## 2018-01-06 DIAGNOSIS — Z510 Encounter for antineoplastic radiation therapy: Secondary | ICD-10-CM | POA: Diagnosis not present

## 2018-01-06 DIAGNOSIS — Z79891 Long term (current) use of opiate analgesic: Secondary | ICD-10-CM | POA: Insufficient documentation

## 2018-01-06 DIAGNOSIS — R0602 Shortness of breath: Secondary | ICD-10-CM | POA: Insufficient documentation

## 2018-01-06 DIAGNOSIS — C3411 Malignant neoplasm of upper lobe, right bronchus or lung: Secondary | ICD-10-CM | POA: Insufficient documentation

## 2018-01-06 DIAGNOSIS — Z9981 Dependence on supplemental oxygen: Secondary | ICD-10-CM | POA: Diagnosis not present

## 2018-01-06 DIAGNOSIS — Z7982 Long term (current) use of aspirin: Secondary | ICD-10-CM | POA: Diagnosis not present

## 2018-01-06 NOTE — Progress Notes (Signed)
  Radiation Oncology         [phone] ________________________________  Name: RYOSUKE ERICKSEN MRN: [id number]  Date: 01/06/2018  DOB: [birth year]  STEREOTACTIC BODY RADIOTHERAPY SIMULATION AND TREATMENT PLANNING NOTE    ICD-10-CM   1. Presumed cancer of right upper lobe of lung ([facility]) C34.11     DIAGNOSIS:  67 y.o. gentleman with a 2.4 cm RUL lung nodule, putative Stage I NSCLC,  NARRATIVE:  The patient was brought to the [facility].  Identity was confirmed.  All relevant records and images related to the planned course of therapy were reviewed.  The patient freely provided informed written consent to proceed with treatment after reviewing the details related to the planned course of therapy. The consent form was witnessed and verified by the simulation staff.  Then, the patient was set-up in a stable reproducible  supine position for radiation therapy.  A BodyFix immobilization pillow was fabricated for reproducible positioning.  Then I personally applied the abdominal compression paddle to limit respiratory excursion.  4D respiratoy motion management CT images were obtained.  Surface markings were placed.  The CT images were loaded into the planning software.  Then, using Cine, MIP, and standard views, the internal target volume (ITV) and planning target volumes (PTV) were delinieated, and avoidance structures were contoured.  Treatment planning then occurred.  The radiation prescription was entered and confirmed.  A total of two complex treatment devices were fabricated in the form of the BodyFix immobilization pillow and a neck accuform cushion.  I have requested : 3D Simulation  I have requested a DVH of the following structures: Heart, Lungs, Esophagus, Chest Wall, Brachial Plexus, Major Blood Vessels, and targets.  SPECIAL TREATMENT PROCEDURE:  The planned course of therapy using radiation constitutes a special treatment procedure. Special care is required in the management of  this patient for the following reasons. This treatment constitutes a Special Treatment Procedure for the following reason: [ High dose per fraction requiring special monitoring for increased toxicities of treatment including daily imaging..  The special nature of the planned course of radiotherapy will require increased physician supervision and oversight to ensure patient's safety with optimal treatment outcomes.  RESPIRATORY MOTION MANAGEMENT SIMULATION:  In order to account for effect of respiratory motion on target structures and other organs in the planning and delivery of radiotherapy, this patient underwent respiratory motion management simulation.  To accomplish this, when the patient was brought to the CT simulation planning suite, 4D respiratoy motion management CT images were obtained.  The CT images were loaded into the planning software.  Then, using a variety of tools including Cine, MIP, and standard views, the target volume and planning target volumes (PTV) were delineated.  Avoidance structures were contoured.  Treatment planning then occurred.  Dose volume histograms were generated and reviewed for each of the requested structure.  The resulting plan was carefully reviewed and approved today.  PLAN:  The patient will receive 54 Gy in 3 fractions.  ________________________________  Sheral Apley Tammi Klippel, M.D.  This document serves as a record of services personally performed by Tyler Pita MD. It was created on his behalf by Delton Coombes, a trained medical scribe. The creation of this record is based on the scribe's personal observations and the provider's statements to them.

## 2018-01-06 NOTE — Progress Notes (Signed)
Weight and vitals stable. Denies pain. Reports immediately following fiducial marker placement he had right sided chest pain, difficult and painful swallowing, and hemoptysis. He confirms these symptoms have since resolved. Reports a productive cough with yellow sputum. Reports SOB with mild exertion. Reports he wears two liters of oxygen at night to sleep.   BP (!) 153/68 (BP Location: Left Arm, Patient Position: Sitting, Cuff Size: Large)   Pulse (!) 59   Temp 98.1 F (36.7 C) (Oral)   Resp 18   Wt 195 lb 6.4 oz (88.6 kg)   SpO2 97%   BMI 28.04 kg/m  Wt Readings from Last 3 Encounters:  01/06/18 195 lb 6.4 oz (88.6 kg)  12/29/17 193 lb (87.5 kg)  12/28/17 193 lb 4.8 oz (87.7 kg)   Patient has a [employer] PACEMAKER Patient denies hx of radiation therapy Denies taking methotrexate

## 2018-01-06 NOTE — Progress Notes (Signed)
Radiation Oncology         (336) [phone] ________________________________  Name: Shaun May MRN: [id number]  Date: 01/06/2018  DOB: 1951-05-04  Follow-Up Visit Note  CC: Celene Squibb, MD  Celene Squibb, MD  Diagnosis:   67 y.o. gentleman with 2.4 cm RUL lung nodule, putative stage I NSCLC.    ICD-10-CM   1. Presumed cancer of right upper lobe of lung ([place]) C34.11     Narrative:  The patient returns today for routine follow-up. Weight and vitals stable. Denies pain. Reports immediately following fiducial marker placement he had right sided chest pain, difficult and painful swallowing, and hemoptysis. He confirms these symptoms have since resolved. Reports a productive cough with yellow sputum. Reports SOB with mild exertion. Reports he wears two liters of oxygen at night to sleep.     ALLERGIES:  is allergic to xarelto [rivaroxaban].  Meds: Current Outpatient Medications  Medication Sig Dispense Refill  . albuterol (PROVENTIL HFA;VENTOLIN HFA) 108 (90 Base) MCG/ACT inhaler Inhale 1-2 puffs into the lungs every 6 (six) hours as needed for wheezing or shortness of breath. 1 Inhaler 0  . aspirin EC 81 MG tablet Take 81 mg by mouth daily.    Marland Kitchen atorvastatin (LIPITOR) 40 MG tablet TAKE ONE TABLET BY MOUTH ONCE DAILY AT 6PM. 30 tablet 6  . carvedilol (COREG) 25 MG tablet Take 25 mg by mouth 2 (two) times daily with a meal.   11  . Dextromethorphan-Guaifenesin (MUCINEX DM MAXIMUM STRENGTH) 60-1200 MG TB12 Take 1 tablet by mouth daily.    . furosemide (LASIX) 40 MG tablet TAKE ONE TABLET BY MOUTH TWICE DAILY. 60 tablet 6  . guaiFENesin-dextromethorphan (ROBITUSSIN DM) 100-10 MG/5ML syrup Take 5 mLs by mouth every 8 (eight) hours as needed for cough.     Marland Kitchen LORazepam (ATIVAN) 2 MG tablet Take 2 mg by mouth 2 (two) times daily.     Marland Kitchen oxyCODONE-acetaminophen (PERCOCET/ROXICET) 5-325 MG tablet Take 1 tablet by mouth 3 (three) times daily as needed (pain).     . OXYGEN 2lpm with sleep and exertion  if needed [facility]    . pantoprazole (PROTONIX) 40 MG tablet TAKE 1 TABLET BY MOUTH DAILY BEFORE BREAKFAST. 90 tablet 3  . Phenyleph-Doxylamine-DM-APAP (NYQUIL SEVERE COLD/FLU PO) Take 1 Dose by mouth at bedtime.    . potassium chloride SA (K-DUR,KLOR-CON) 20 MEQ tablet Take 20 mEq by mouth daily.     Marland Kitchen rOPINIRole (REQUIP) 0.25 MG tablet Take 0.25-0.75 mg by mouth at bedtime.   1  . sodium chloride (OCEAN) 0.65 % SOLN nasal spray Place 1 spray into both nostrils as needed for congestion.    . Tiotropium Bromide-Olodaterol (STIOLTO RESPIMAT) 2.5-2.5 MCG/ACT AERS Inhale 2 puffs into the lungs daily. 1 Inhaler 11   No current facility-administered medications for this encounter.     Physical Findings: The patient is in no acute distress. Patient is alert and oriented.  weight is 195 lb 6.4 oz (88.6 kg). His oral temperature is 98.1 F (36.7 C). His blood pressure is 153/68 (abnormal) and his pulse is 59 (abnormal). His respiration is 18 and oxygen saturation is 97%. .  No significant changes.  Lab Findings: Lab Results  Component Value Date   WBC 10.0 12/28/2017   HGB 13.6 12/28/2017   HGB 13.8 02/21/2017   HCT 41.3 12/28/2017   HCT 40.8 02/21/2017   PLT 194 12/28/2017   PLT 236 02/21/2017    Lab Results  Component Value Date  NA 137 12/28/2017   NA 138 02/21/2017   K 3.9 12/28/2017   CO2 21 (L) 12/28/2017   GLUCOSE 95 12/28/2017   BUN 11 12/28/2017   BUN 8 02/21/2017   CREATININE 0.99 12/28/2017   CREATININE 1.07 04/08/2016   BILITOT 1.8 (H) 12/28/2017   ALKPHOS 77 12/28/2017   AST 51 (H) 12/28/2017   ALT 39 12/28/2017   PROT 7.4 12/28/2017   ALBUMIN 4.0 12/28/2017   CALCIUM 8.6 (L) 12/28/2017   ANIONGAP 18 (H) 12/28/2017    Radiographic Findings: Dg Chest 2 View  Result Date: 12/28/2017 CLINICAL DATA:  Preoperative examination for bronchoscopy. EXAM: CHEST  2 VIEW COMPARISON:  08/01/2017; 10/07/2016; chest CT-08/01/2017; PET-CT-11/30/2017 FINDINGS: Grossly unchanged  cardiac silhouette and mediastinal contours. Stable position of support apparatus. Known right upper lobe pulmonary nodule is again suboptimally evaluated radiographically. No focal airspace opacities. No pleural effusion or pneumothorax. No evidence of edema. No acute osseus abnormalities. Stigmata of DISH within the thoracic spine. IMPRESSION: 1.  No acute cardiopulmonary disease. 2. Known right upper lobe pulmonary nodule is again suboptimally evaluated radiographically. Electronically Signed   By: Sandi Mariscal M.D.   On: 12/28/2017 16:31   Dg C-arm Bronchoscopy  Result Date: 12/29/2017 C-ARM BRONCHOSCOPY: Fluoroscopy was utilized by the requesting physician.  No radiographic interpretation.    Impression/ Plan: 67 y.o. gentleman with  2.4 cm RUL lung nodule, putative stage I NSCLC.  Today, we talked to the patient ad his wife about the findings and working thus far. We discussed that the biopsy was non-diagnostic. We discussed possible treatment options including empiric SBRT, Observation or repeat biopsy.  After an informed discussion of the pros and cons of each option, he elected to proceed with SBRT.     _____________________________________  Sheral Apley. Tammi Klippel, M.D.  This document serves as a record of services personally performed by Tyler Pita MD. It was created on his behalf by Delton Coombes, a trained medical scribe. The creation of this record is based on the scribe's personal observations and the provider's statements to them.

## 2018-01-12 DIAGNOSIS — Z888 Allergy status to other drugs, medicaments and biological substances status: Secondary | ICD-10-CM | POA: Diagnosis not present

## 2018-01-12 DIAGNOSIS — Z510 Encounter for antineoplastic radiation therapy: Secondary | ICD-10-CM | POA: Diagnosis not present

## 2018-01-12 DIAGNOSIS — R079 Chest pain, unspecified: Secondary | ICD-10-CM | POA: Diagnosis not present

## 2018-01-12 DIAGNOSIS — C3411 Malignant neoplasm of upper lobe, right bronchus or lung: Secondary | ICD-10-CM | POA: Diagnosis not present

## 2018-01-12 DIAGNOSIS — R0602 Shortness of breath: Secondary | ICD-10-CM | POA: Diagnosis not present

## 2018-01-12 DIAGNOSIS — R042 Hemoptysis: Secondary | ICD-10-CM | POA: Diagnosis not present

## 2018-01-13 ENCOUNTER — Ambulatory Visit
Admission: RE | Admit: 2018-01-13 | Discharge: 2018-01-13 | Disposition: A | Discharge status: Home / Self Care | Payer: Medicare Other | Source: Ambulatory Visit | Attending: Radiation Oncology | Admitting: Radiation Oncology

## 2018-01-13 DIAGNOSIS — Z888 Allergy status to other drugs, medicaments and biological substances status: Secondary | ICD-10-CM | POA: Diagnosis not present

## 2018-01-13 DIAGNOSIS — C3411 Malignant neoplasm of upper lobe, right bronchus or lung: Secondary | ICD-10-CM | POA: Diagnosis not present

## 2018-01-13 DIAGNOSIS — R0602 Shortness of breath: Secondary | ICD-10-CM | POA: Diagnosis not present

## 2018-01-13 DIAGNOSIS — Z510 Encounter for antineoplastic radiation therapy: Secondary | ICD-10-CM | POA: Diagnosis not present

## 2018-01-13 DIAGNOSIS — R042 Hemoptysis: Secondary | ICD-10-CM | POA: Diagnosis not present

## 2018-01-13 DIAGNOSIS — R079 Chest pain, unspecified: Secondary | ICD-10-CM | POA: Diagnosis not present

## 2018-01-17 ENCOUNTER — Telehealth: Payer: Self-pay | Admitting: General Practice

## 2018-01-17 ENCOUNTER — Ambulatory Visit
Admission: RE | Admit: 2018-01-17 | Discharge: 2018-01-17 | Disposition: A | Discharge status: Home / Self Care | Payer: Medicare Other | Source: Ambulatory Visit | Attending: Radiation Oncology | Admitting: Radiation Oncology

## 2018-01-17 DIAGNOSIS — R0602 Shortness of breath: Secondary | ICD-10-CM | POA: Diagnosis not present

## 2018-01-17 DIAGNOSIS — R042 Hemoptysis: Secondary | ICD-10-CM | POA: Diagnosis not present

## 2018-01-17 DIAGNOSIS — R079 Chest pain, unspecified: Secondary | ICD-10-CM | POA: Diagnosis not present

## 2018-01-17 DIAGNOSIS — C3411 Malignant neoplasm of upper lobe, right bronchus or lung: Secondary | ICD-10-CM | POA: Diagnosis not present

## 2018-01-17 DIAGNOSIS — Z510 Encounter for antineoplastic radiation therapy: Secondary | ICD-10-CM | POA: Diagnosis not present

## 2018-01-17 DIAGNOSIS — Z888 Allergy status to other drugs, medicaments and biological substances status: Secondary | ICD-10-CM | POA: Diagnosis not present

## 2018-01-17 NOTE — Telephone Encounter (Signed)
[facility] CSW Progress Note  CSW spoke w patient by phone - patient will meet w CSW on 2/15 after scheduled treatment - can be assisted w gas card from [facility].  Application submitted to [employer] gas card fund today.  Agency will contact patient directly if they have funds available.  Patient reviewed application for [facility] - decided not to complete at this time.  Edwyna Shell, LCSW Clinical Social Worker Phone:  [phone]

## 2018-01-17 NOTE — Telephone Encounter (Signed)
[facility] CSW Progress Note  CSW received VM from patient requesting help w gas cards for transportation.  CSW left VM for Santiago Glad, emergency contact, requesting call back from patient.  Unable to reach patient as his VM was not set up.  CSW can assist w gas cards via [employer] as well as submit [place] request - awaiting return call from patient to discuss needs.  Edwyna Shell, LCSW Clinical Social Worker Phone:  [phone]

## 2018-01-20 ENCOUNTER — Ambulatory Visit
Admission: RE | Admit: 2018-01-20 | Discharge: 2018-01-20 | Disposition: A | Discharge status: Home / Self Care | Payer: Medicare Other | Source: Ambulatory Visit | Attending: Radiation Oncology | Admitting: Radiation Oncology

## 2018-01-20 ENCOUNTER — Encounter: Payer: Self-pay | Admitting: General Practice

## 2018-01-20 ENCOUNTER — Encounter: Payer: Self-pay | Admitting: Urology

## 2018-01-20 ENCOUNTER — Other Ambulatory Visit: Payer: Self-pay | Admitting: Urology

## 2018-01-20 DIAGNOSIS — R079 Chest pain, unspecified: Secondary | ICD-10-CM | POA: Diagnosis not present

## 2018-01-20 DIAGNOSIS — Z510 Encounter for antineoplastic radiation therapy: Secondary | ICD-10-CM | POA: Diagnosis not present

## 2018-01-20 DIAGNOSIS — R0602 Shortness of breath: Secondary | ICD-10-CM | POA: Diagnosis not present

## 2018-01-20 DIAGNOSIS — R042 Hemoptysis: Secondary | ICD-10-CM | POA: Diagnosis not present

## 2018-01-20 DIAGNOSIS — C3411 Malignant neoplasm of upper lobe, right bronchus or lung: Secondary | ICD-10-CM | POA: Diagnosis not present

## 2018-01-20 DIAGNOSIS — Z888 Allergy status to other drugs, medicaments and biological substances status: Secondary | ICD-10-CM | POA: Diagnosis not present

## 2018-01-20 NOTE — Progress Notes (Signed)
[place] CSW Progress Notes  Met w patient, is not eligible for [employer] card as he is [employer].  Did not want to apply to [employer] due to foundation requirement for submission of documents.  CSW verified that his request for gas card assistance was fulfilled by [employer] [facility] [place] - per agency they have mailed him a gas card this week, should be received at his home shortly.  Edwyna Shell, LCSW Clinical Social Worker Phone:  [phone]  .

## 2018-01-20 NOTE — Progress Notes (Signed)
  Radiation Oncology         (336) [phone] ________________________________  Name: Shaun May MRN: [id number]  Date: 01/20/2018  DOB: 1951-10-15  End of Treatment Note  Diagnosis:   67 y.o. gentleman with 2.4 cm RUL lung nodule, putative stage I NSCLC.     Indication for treatment:  Curative, Definitive SBRT       Radiation treatment dates:   01/13/18, 01/17/18, 01/20/18  Site/dose:   The target was treated to 54 Gy in 3 fractions of 18 Gy  Beams/energy:   The patient was treated using stereotactic body radiotherapy according to a 3D conformal radiotherapy plan.  Volumetric arc fields were employed to deliver 6 MV X-rays.  Image guidance was performed with per fraction cone beam CT prior to treatment under personal MD supervision.  Immobilization was achieved using BodyFix Pillow.  Narrative: The patient tolerated radiation treatment relatively well.   He experienced mild fatigue but denied any chest pain, dysphagia or increased shortness of breath.  He continued with a productive cough with clear/whitish mucus which remained unchanged.  He denied hemoptysis, fever, chills, nausea or vomiting and was able to maintain his weight throughout.  Plan: The patient has completed radiation treatment. The patient will return to radiation oncology clinic for routine followup in 6 weeks with a CT Chest to be performed prior to that visit. I advised them to call or return sooner if they have any questions or concerns related to their recovery or treatment. ________________________________  Sheral Apley. Tammi Klippel, M.D.

## 2018-01-20 NOTE — Progress Notes (Signed)
Patient is an end of treatment for Rt Lung SBRT. Denies any pain. States that he has mild fatigue. States that he gets shortness of breath. Also stated that he has COPD and smokes. Denies any difficulty with swallowing. States that he coughs up clear mucus. States that his appetite is ok. Blood pressure was elevated denies any headaches,numbness or tingling in his extremities,or chest pain. Vitals:   01/20/18 1331  BP: (!) 161/74  Pulse: (!) 58  Resp: 18  Temp: 98 F (36.7 C)  TempSrc: Oral  SpO2: 98%  Weight: 194 lb 8 oz (88.2 kg)

## 2018-01-24 ENCOUNTER — Encounter (INDEPENDENT_AMBULATORY_CARE_PROVIDER_SITE_OTHER): Payer: Self-pay | Admitting: Internal Medicine

## 2018-01-24 ENCOUNTER — Ambulatory Visit (INDEPENDENT_AMBULATORY_CARE_PROVIDER_SITE_OTHER): Payer: Medicare Other | Admitting: Internal Medicine

## 2018-01-24 VITALS — BP 130/100 | HR 62 | Temp 98.3°F | Resp 18 | Ht 70.0 in | Wt 193.1 lb

## 2018-01-24 DIAGNOSIS — K219 Gastro-esophageal reflux disease without esophagitis: Secondary | ICD-10-CM | POA: Diagnosis not present

## 2018-01-24 DIAGNOSIS — K2270 Barrett's esophagus without dysplasia: Secondary | ICD-10-CM | POA: Diagnosis not present

## 2018-01-24 NOTE — Patient Instructions (Signed)
Notify if you have melena or rectal bleeding

## 2018-01-24 NOTE — Progress Notes (Signed)
Presenting complaint;  Follow-up for GERD.  Subjective:  Patient is 67 year old Caucasian male who is here for scheduled visit.  He was last seen 1 year ago.  He has a history of GERD complicated by short segment Barrett's esophagus as well as history of upper GI bleed secondary to gastric ulcer in 2017.  Follow-up EGD revealed complete healing of these ulcers. Patient states he is doing well.  He rarely has heartburn with certain foods.  He denies dysphagia nausea or vomiting.  He does not take NSAIDs other than low-dose aspirin.  Bowels move daily.  He denies melena or rectal bleeding or abdominal pain.  He has chronic back pain.  He takes no more than 2 pain pills a day.  He has very good appetite. He has gained 13 pounds since his last visit. Patient states he had video video bronchoscopy and tries bronchial biopsies with fiducial placement followed by radiation therapy.  Biopsies revealed very atypical cells suspicious for small carcinoma.   Current Medications: Outpatient Encounter Medications as of 01/24/2018  Medication Sig  . albuterol (PROVENTIL HFA;VENTOLIN HFA) 108 (90 Base) MCG/ACT inhaler Inhale 1-2 puffs into the lungs every 6 (six) hours as needed for wheezing or shortness of breath.  Marland Kitchen aspirin EC 81 MG tablet Take 81 mg by mouth daily.  Marland Kitchen atorvastatin (LIPITOR) 40 MG tablet TAKE ONE TABLET BY MOUTH ONCE DAILY AT 6PM.  . carvedilol (COREG) 25 MG tablet Take 25 mg by mouth 2 (two) times daily with a meal.   . Dextromethorphan-Guaifenesin (MUCINEX DM MAXIMUM STRENGTH) 60-1200 MG TB12 Take 1 tablet by mouth daily.  . furosemide (LASIX) 40 MG tablet TAKE ONE TABLET BY MOUTH TWICE DAILY.  Marland Kitchen guaiFENesin-dextromethorphan (ROBITUSSIN DM) 100-10 MG/5ML syrup Take 5 mLs by mouth every 8 (eight) hours as needed for cough.   Marland Kitchen LORazepam (ATIVAN) 2 MG tablet Take 2 mg by mouth 2 (two) times daily.   Marland Kitchen oxyCODONE-acetaminophen (PERCOCET/ROXICET) 5-325 MG tablet Take 1 tablet by mouth 3 (three)  times daily as needed (pain).   . OXYGEN 2lpm with sleep and exertion if needed [facility]  . pantoprazole (PROTONIX) 40 MG tablet TAKE 1 TABLET BY MOUTH DAILY BEFORE BREAKFAST.  Marland Kitchen Phenyleph-Doxylamine-DM-APAP (NYQUIL SEVERE COLD/FLU PO) Take 1 Dose by mouth at bedtime.  . potassium chloride SA (K-DUR,KLOR-CON) 20 MEQ tablet Take 20 mEq by mouth daily.   Marland Kitchen rOPINIRole (REQUIP) 0.25 MG tablet Take 0.25-0.75 mg by mouth at bedtime.   . sodium chloride (OCEAN) 0.65 % SOLN nasal spray Place 1 spray into both nostrils as needed for congestion.  . Tiotropium Bromide-Olodaterol (STIOLTO RESPIMAT) 2.5-2.5 MCG/ACT AERS Inhale 2 puffs into the lungs daily.   No facility-administered encounter medications on file as of 01/24/2018.      Objective: Blood pressure (!) 130/100, pulse 62, temperature 98.3 F (36.8 C), temperature source Oral, resp. rate 18, height 5\' 10"  (1.778 m), weight 193 lb 1.6 oz (87.6 kg). Patient is alert and in no acute distress. Conjunctiva is pink. Sclera is nonicteric Oropharyngeal mucosa is normal. No neck masses or thyromegaly noted. He has lipoma over posterior aspect of the right shoulder above scapula.  It is about 6 x 6 cm.  Very soft and nontender. Cardiac exam with regular rhythm normal S1 and S2. No murmur or gallop noted. Lungs are clear to auscultation. Abdomen is full but soft and nontender without organomegaly or masses. No LE edema or clubbing noted.  Labs/studies Results:    Assessment:  #1.  Chronic GERD  complicated by short segment Barrett's esophagus.  Patient is doing well with PPI therapy.  He also has history of gastric ulcers.  Last EGD was in November 2017.  Therefore he has multiple reasons to stay on PPI.  #2.  History of colonic adenoma.  Last colonoscopy was in November 2017.  Next colonoscopy in November 2022.   Plan:  Patient will continue pantoprazole at 40 mg p.o. every morning. He has refills until January next year. Office visit in 1  year.

## 2018-01-31 ENCOUNTER — Ambulatory Visit (INDEPENDENT_AMBULATORY_CARE_PROVIDER_SITE_OTHER): Payer: Medicare Other | Admitting: *Deleted

## 2018-01-31 DIAGNOSIS — I442 Atrioventricular block, complete: Secondary | ICD-10-CM | POA: Diagnosis not present

## 2018-02-01 NOTE — Progress Notes (Signed)
Remote pacemaker transmission.   

## 2018-02-02 ENCOUNTER — Encounter: Payer: Self-pay | Admitting: Cardiology

## 2018-02-07 ENCOUNTER — Telehealth: Payer: Self-pay | Admitting: *Deleted

## 2018-02-07 NOTE — Telephone Encounter (Signed)
Called patient to inform of Stat labs on 03-03-18 @ 3:45 pm @ Shaun May and his Ct on 03-03-18 - arrival time - 4:45 @ [facility], pt. To have water only - 4 hrs. Prior to test, pt. To follow-up with Ashlyn Bruning for results on 03-07-18, spoke with patient and he is aware of these appts.

## 2018-02-15 LAB — CUP PACEART REMOTE DEVICE CHECK
Battery Remaining Longevity: 128 mo
Battery Voltage: 2.79 V
Brady Statistic AS VS Percent: 0 %
Implantable Lead Implant Date: 20161122
Implantable Lead Location: 753860
Implantable Lead Model: 5076
Implantable Pulse Generator Implant Date: 20161122
Lead Channel Pacing Threshold Amplitude: 0.5 V
Lead Channel Pacing Threshold Pulse Width: 0.4 ms
Lead Channel Setting Pacing Amplitude: 2 V
Lead Channel Setting Pacing Pulse Width: 0.4 ms
Lead Channel Setting Sensing Sensitivity: 4 mV
MDC IDC LEAD IMPLANT DT: 20161122
MDC IDC LEAD LOCATION: 753859
MDC IDC MSMT BATTERY IMPEDANCE: 139 Ohm
MDC IDC MSMT LEADCHNL RA IMPEDANCE VALUE: 511 Ohm
MDC IDC MSMT LEADCHNL RA PACING THRESHOLD AMPLITUDE: 0.625 V
MDC IDC MSMT LEADCHNL RA PACING THRESHOLD PULSEWIDTH: 0.4 ms
MDC IDC MSMT LEADCHNL RV IMPEDANCE VALUE: 626 Ohm
MDC IDC SESS DTM: 20190226152656
MDC IDC SET LEADCHNL RV PACING AMPLITUDE: 2.5 V
MDC IDC STAT BRADY AP VP PERCENT: 31 %
MDC IDC STAT BRADY AP VS PERCENT: 0 %
MDC IDC STAT BRADY AS VP PERCENT: 69 %

## 2018-02-21 ENCOUNTER — Inpatient Hospital Stay: Admission: RE | Admit: 2018-02-21 | Payer: Self-pay | Source: Ambulatory Visit | Admitting: Urology

## 2018-02-23 ENCOUNTER — Ambulatory Visit (INDEPENDENT_AMBULATORY_CARE_PROVIDER_SITE_OTHER): Payer: Medicare Other | Admitting: Internal Medicine

## 2018-02-23 ENCOUNTER — Encounter: Payer: Self-pay | Admitting: Internal Medicine

## 2018-02-23 VITALS — BP 142/82 | HR 61 | Ht 70.0 in | Wt 198.4 lb

## 2018-02-23 DIAGNOSIS — I442 Atrioventricular block, complete: Secondary | ICD-10-CM

## 2018-02-23 NOTE — Patient Instructions (Signed)
Medication Instructions:  Your physician recommends that you continue on your current medications as directed. Please refer to the Current Medication list given to you today.   Labwork: NONE   Testing/Procedures: NONE   Follow-Up: Your physician wants you to follow-up in: 1 Year.  You will receive a reminder letter in the mail two months in advance. If you don't receive a letter, please call our office to schedule the follow-up appointment.   Any Other Special Instructions Will Be Listed Below (If Applicable).     If you need a refill on your cardiac medications before your next appointment, please call your pharmacy.

## 2018-02-23 NOTE — Progress Notes (Signed)
HPI [name] returns today for PPM followup. He is a 67 yo man with CHB, s/p PPM insertion, who then developed a pocket hematoma and underwent pocket revision. He has developed atrial fibrillation and was placed on anti-coagulation but then developed a GI bleed and required a 5 unit blood transfusion. He has had no additional GI bleeding since stopping his Xarelto. He is minimally symptomatic from his atrial fib. He is still smoking cigarettes. He has dyspnea with exertion. In the interim, he has had a diagnosis of lung Ca and is undergoing XRT as he was not thought to be a candidate for surgical resection. He feels well and now that he has been off of systemic anti-coagulation, has not had any bleeding.   Allergies  Allergen Reactions  . Xarelto [Rivaroxaban] Other (See Comments)    Caused bleeding.      Current Outpatient Medications  Medication Sig Dispense Refill  . albuterol (PROVENTIL HFA;VENTOLIN HFA) 108 (90 Base) MCG/ACT inhaler Inhale 1-2 puffs into the lungs every 6 (six) hours as needed for wheezing or shortness of breath. 1 Inhaler 0  . aspirin EC 81 MG tablet Take 81 mg by mouth daily.    Marland Kitchen atorvastatin (LIPITOR) 40 MG tablet TAKE ONE TABLET BY MOUTH ONCE DAILY AT 6PM. 30 tablet 6  . carvedilol (COREG) 25 MG tablet Take 25 mg by mouth 2 (two) times daily with a meal.   11  . Dextromethorphan-Guaifenesin (MUCINEX DM MAXIMUM STRENGTH) 60-1200 MG TB12 Take 1 tablet by mouth daily.    . furosemide (LASIX) 40 MG tablet TAKE ONE TABLET BY MOUTH TWICE DAILY. 60 tablet 6  . guaiFENesin-dextromethorphan (ROBITUSSIN DM) 100-10 MG/5ML syrup Take 5 mLs by mouth every 8 (eight) hours as needed for cough.     Marland Kitchen LORazepam (ATIVAN) 2 MG tablet Take 2 mg by mouth 2 (two) times daily.     Marland Kitchen oxyCODONE-acetaminophen (PERCOCET/ROXICET) 5-325 MG tablet Take 1 tablet by mouth 3 (three) times daily as needed (pain).     . OXYGEN 2lpm with sleep and exertion if needed AHC    . pantoprazole  (PROTONIX) 40 MG tablet TAKE 1 TABLET BY MOUTH DAILY BEFORE BREAKFAST. 90 tablet 3  . Phenyleph-Doxylamine-DM-APAP (NYQUIL SEVERE COLD/FLU PO) Take 1 Dose by mouth at bedtime.    . potassium chloride SA (K-DUR,KLOR-CON) 20 MEQ tablet Take 20 mEq by mouth daily.     Marland Kitchen rOPINIRole (REQUIP) 0.25 MG tablet Take 0.25-0.75 mg by mouth at bedtime.   1  . sodium chloride (OCEAN) 0.65 % SOLN nasal spray Place 1 spray into both nostrils as needed for congestion.    . Tiotropium Bromide-Olodaterol (STIOLTO RESPIMAT) 2.5-2.5 MCG/ACT AERS Inhale 2 puffs into the lungs daily. 1 Inhaler 11   No current facility-administered medications for this visit.      Past Medical History:  Diagnosis Date  . Anemia    when on Xarelto  . COPD (chronic obstructive pulmonary disease) (HCC)    stage 4 COPD  . Dyspnea   . Hemochromatosis   . Hypertension   . Lung nodule   . Mobitz type 2 second degree heart block    PPM MDT 10/28/15 Dr. Lovena Le  . Pericardial effusion   . Persistent atrial fibrillation ([place])   . Presence of permanent cardiac pacemaker    Medtronic    ROS:   All systems reviewed and negative except as noted in the HPI.   Past Surgical History:  Procedure Laterality Date  .  BIOPSY  10/13/2016   Procedure: BIOPSY;  Surgeon: Rogene Houston, MD;  [facility];  Service: Endoscopy;;  esophageal  . CARDIAC CATHETERIZATION N/A 03/31/2016   Procedure: Left Heart Cath and Coronary Angiography;  Surgeon: Wellington Hampshire, MD;  Location: [facility];  Service: Cardiovascular;  Laterality: N/A;  . COLONOSCOPY N/A 10/13/2016   Procedure: COLONOSCOPY;  Surgeon: Rogene Houston, MD;  [facility];  Service: Endoscopy;  Laterality: N/A;  . EP IMPLANTABLE DEVICE N/A 10/28/2015   Procedure: Pacemaker Implant;  Surgeon: Evans Lance, MD;  Location: [place] [facility];  Service: Cardiovascular;  Laterality: N/A;  . EP IMPLANTABLE DEVICE N/A 11/07/2015   Procedure: Pocket Revision;   Surgeon: Evans Lance, MD;  Location: [facility];  Service: Cardiovascular;  Laterality: N/A;  . ESOPHAGOGASTRODUODENOSCOPY N/A 05/27/2016   Procedure: ESOPHAGOGASTRODUODENOSCOPY (EGD);  Surgeon: Rogene Houston, MD;  [facility];  Service: Endoscopy;  Laterality: N/A;  . ESOPHAGOGASTRODUODENOSCOPY N/A 10/13/2016   Procedure: ESOPHAGOGASTRODUODENOSCOPY (EGD);  Surgeon: Rogene Houston, MD;  [facility];  Service: Endoscopy;  Laterality: N/A;  1:30 - moved to 11/8 @ 1:00 - Ann notified pt  . FUDUCIAL PLACEMENT Right 12/29/2017   Procedure: PLACEMENT OF FUDUCIAL;  Surgeon: [name], MD;  Location: [facility];  Service: Thoracic;  Laterality: Right;  . POLYPECTOMY  10/13/2016   Procedure: POLYPECTOMY;  Surgeon: Rogene Houston, MD;  [facility];  Service: Endoscopy;;  transverse colon polypectomy  . TONSILLECTOMY    . VIDEO BRONCHOSCOPY WITH ENDOBRONCHIAL NAVIGATION Right 12/29/2017   Procedure: VIDEO BRONCHOSCOPY WITH ENDOBRONCHIAL NAVIGATION;  Surgeon: [name], MD;  Location: [facility] OR;  Service: Thoracic;  Laterality: Right;     Family History  Problem Relation Age of Onset  . Diabetes Other   . Dementia Mother      Social History   Socioeconomic History  . Marital status: Divorced    Spouse name: Not on file  . Number of children: Not on file  . Years of education: Not on file  . Highest education level: Not on file  Occupational History  . Not on file  Social Needs  . Financial resource strain: Not on file  . Food insecurity:    Worry: Not on file    Inability: Not on file  . Transportation needs:    Medical: Not on file    Non-medical: Not on file  Tobacco Use  . Smoking status: Current Every Day Smoker    Packs/day: 2.00    Years: 56.00    Pack years: 112.00    Types: E-cigarettes, Cigarettes  . Smokeless tobacco: Never Used  Substance and Sexual Activity  . Alcohol use: Yes    Alcohol/week: 1.8 oz     Types: 3 Shots of liquor per week    Comment: nightly  . Drug use: No  . Sexual activity: Not on file  Lifestyle  . Physical activity:    Days per week: Not on file    Minutes per session: Not on file  . Stress: Not on file  Relationships  . Social connections:    Talks on phone: Not on file    Gets together: Not on file    Attends religious service: Not on file    Active member of club or organization: Not on file    Attends meetings of clubs or organizations: Not on file    Relationship status:  Not on file  . Intimate partner violence:    Fear of current or ex partner: Not on file    Emotionally abused: Not on file    Physically abused: Not on file    Forced sexual activity: Not on file  Other Topics Concern  . Not on file  Social History Narrative  . Not on file     BP (!) 142/82   Pulse 61   Ht 5\' 10"  (1.778 m)   Wt 198 lb 6.4 oz (90 kg)   SpO2 94%   BMI 28.47 kg/m   Physical Exam:  stable appearing 67 yo man, NAD HEENT: Unremarkable Neck:  6 cm JVD, no thyromegally Lymphatics:  No adenopathy Back:  No CVA tenderness Lungs:  Clear with no wheezes HEART:  Regular rate rhythm, no murmurs, no rubs, no clicks Abd:  soft, positive bowel sounds, no organomegally, no rebound, no guarding Ext:  2 plus pulses, no edema, no cyanosis, no clubbing Skin:  No rashes no nodules Neuro:  CN II through XII intact, motor grossly intact   DEVICE  Normal device function.  See PaceArt for details.   Assess/Plan: 1. CHB - he is asymptomatic, s/p PPM insertion.  2. PPM - his medtronic DDD PM is working normally. Will recheck in several months. 3. HTN - his blood pressure is up a bit and I have asked him to reduce his salt intake. 4. Tobacco abuse - he is still smoking one PPD. He is encouraged to stop. Chantix did not help.  Mikle Bosworth.D.

## 2018-03-03 ENCOUNTER — Ambulatory Visit (HOSPITAL_COMMUNITY)
Admission: RE | Admit: 2018-03-03 | Discharge: 2018-03-03 | Disposition: A | Discharge status: Home / Self Care | Payer: Medicare Other | Source: Ambulatory Visit | Attending: Urology | Admitting: Urology

## 2018-03-03 DIAGNOSIS — C3411 Malignant neoplasm of upper lobe, right bronchus or lung: Secondary | ICD-10-CM | POA: Insufficient documentation

## 2018-03-03 DIAGNOSIS — J439 Emphysema, unspecified: Secondary | ICD-10-CM | POA: Diagnosis not present

## 2018-03-03 DIAGNOSIS — I700 Atherosclerosis of aorta: Secondary | ICD-10-CM | POA: Insufficient documentation

## 2018-03-03 DIAGNOSIS — C3490 Malignant neoplasm of unspecified part of unspecified bronchus or lung: Secondary | ICD-10-CM | POA: Diagnosis not present

## 2018-03-03 DIAGNOSIS — R911 Solitary pulmonary nodule: Secondary | ICD-10-CM | POA: Diagnosis not present

## 2018-03-03 DIAGNOSIS — Z923 Personal history of irradiation: Secondary | ICD-10-CM | POA: Diagnosis not present

## 2018-03-03 LAB — POCT I-STAT CREATININE: CREATININE: 1 mg/dL (ref 0.61–1.24)

## 2018-03-03 MED ORDER — IOPAMIDOL (ISOVUE-300) INJECTION 61%
75.0000 mL | Freq: Once | INTRAVENOUS | Status: AC | PRN
  Administered 2018-03-03: 75 mL via INTRAVENOUS

## 2018-03-03 NOTE — Progress Notes (Signed)
Shaun May 67 y.o.gentleman with 2.4 cm RUL lung nodule, putative stage I NSCLC radiation completed 11-19-18, review  03-29-19CT chest w contrast, FU.   Weight changes, if any: Wt Readings from Last 3 Encounters:  02/23/18 198 lb 6.4 oz (90 kg)  01/24/18 193 lb 1.6 oz (87.6 kg)  01/20/18 194 lb 8 oz (88.2 kg)   Respiratory complaints, if any: SOB,coughing clear to brown at times and wheezing, has COPD, O 2 L/M at night Hemoptysis, if any: No  Swallowing Problems/Pain/Difficulty swallowing:No Smoking Tobacco/Marijuana/Snuff/ETOH use: Appetite : Great Pain:No BP (!) 157/75 (BP Location: Left Arm, Patient Position: Sitting, Cuff Size: Normal)   Pulse 60   Temp 98.3 F (36.8 C) (Oral)   Resp 20   Ht 5\' 10"  (1.778 m)   Wt 199 lb 6.4 oz (90.4 kg)   SpO2 97%   BMI 28.61 kg/m

## 2018-03-06 ENCOUNTER — Telehealth: Payer: Self-pay | Admitting: *Deleted

## 2018-03-06 NOTE — Telephone Encounter (Signed)
CALLED PATIENT TO INFORM OF FU FOR 03-07-18, SPOKE WITH SGO KAREN MCCOLLUM AND SHE IS AWARE OF THIS APPT.

## 2018-03-07 ENCOUNTER — Encounter: Payer: Self-pay | Admitting: Urology

## 2018-03-07 ENCOUNTER — Other Ambulatory Visit: Payer: Self-pay

## 2018-03-07 ENCOUNTER — Ambulatory Visit
Admission: RE | Admit: 2018-03-07 | Discharge: 2018-03-07 | Disposition: A | Discharge status: Home / Self Care | Payer: Medicare Other | Source: Ambulatory Visit | Attending: Urology | Admitting: Urology

## 2018-03-07 VITALS — BP 157/75 | HR 60 | Temp 98.3°F | Resp 20 | Ht 70.0 in | Wt 199.4 lb

## 2018-03-07 DIAGNOSIS — Z7982 Long term (current) use of aspirin: Secondary | ICD-10-CM | POA: Insufficient documentation

## 2018-03-07 DIAGNOSIS — J439 Emphysema, unspecified: Secondary | ICD-10-CM | POA: Insufficient documentation

## 2018-03-07 DIAGNOSIS — Z888 Allergy status to other drugs, medicaments and biological substances status: Secondary | ICD-10-CM | POA: Insufficient documentation

## 2018-03-07 DIAGNOSIS — Z08 Encounter for follow-up examination after completed treatment for malignant neoplasm: Secondary | ICD-10-CM | POA: Diagnosis not present

## 2018-03-07 DIAGNOSIS — I700 Atherosclerosis of aorta: Secondary | ICD-10-CM | POA: Diagnosis not present

## 2018-03-07 DIAGNOSIS — Z79891 Long term (current) use of opiate analgesic: Secondary | ICD-10-CM | POA: Insufficient documentation

## 2018-03-07 DIAGNOSIS — Z79899 Other long term (current) drug therapy: Secondary | ICD-10-CM | POA: Insufficient documentation

## 2018-03-07 DIAGNOSIS — R911 Solitary pulmonary nodule: Secondary | ICD-10-CM | POA: Diagnosis not present

## 2018-03-07 DIAGNOSIS — C3490 Malignant neoplasm of unspecified part of unspecified bronchus or lung: Secondary | ICD-10-CM | POA: Diagnosis not present

## 2018-03-07 DIAGNOSIS — Z923 Personal history of irradiation: Secondary | ICD-10-CM | POA: Diagnosis not present

## 2018-03-07 DIAGNOSIS — C3411 Malignant neoplasm of upper lobe, right bronchus or lung: Secondary | ICD-10-CM

## 2018-03-07 DIAGNOSIS — Z9981 Dependence on supplemental oxygen: Secondary | ICD-10-CM | POA: Diagnosis not present

## 2018-03-07 NOTE — Progress Notes (Signed)
Radiation Oncology         [phone] ________________________________  Name: Shaun May MRN: [id number]  Date: 03/07/2018  DOB: 18-Mar-1951  Post Treatment Note  CC: Celene Squibb, MD  Celene Squibb, MD  Diagnosis:   67 y.o.gentleman with 2.4 cm RUL lung nodule, putative stage I NSCLC.   Interval Since Last Radiation:  6 weeks  01/13/18, 01/17/18, 01/20/18:  The target was treated to 54 Gy in 3 fractions of 18 Gy  Narrative:  The patient returns today for routine follow-up. He tolerated radiation treatment relatively well.   He experienced mild fatigue but denied any chest pain, dysphagia or increased shortness of breath.  He continued with a productive cough with clear/whitish mucus which remained unchanged.  He denied hemoptysis, fever, chills, nausea or vomiting and was able to maintain his weight throughout.                            On review of systems, the patient states that he is feeling well in general.  He specifically denies increased cough, chest pain, hemoptysis, increased shortness of breath, fever or chills.  He has seen some scant brown tinge in his sputum- present prior to treatment and intermittently since treatment but none recently. He has noticed a gradual return in his energy level.  He reports a healthy appetite and is maintaining his weight.  He denies abdominal pain, nausea, vomiting or diarrhea. He continues using Oxygen at 2L via nasal canula at night.  He had a recent follow up CT Chest on 03/03/18 which shows a positive treatment response and no evidence of disease progression.   ALLERGIES:  is allergic to xarelto [rivaroxaban].  Meds: Current Outpatient Medications  Medication Sig Dispense Refill  . albuterol (PROVENTIL HFA;VENTOLIN HFA) 108 (90 Base) MCG/ACT inhaler Inhale 1-2 puffs into the lungs every 6 (six) hours as needed for wheezing or shortness of breath. 1 Inhaler 0  . aspirin EC 81 MG tablet Take 81 mg by mouth daily.    Marland Kitchen atorvastatin (LIPITOR) 40  MG tablet TAKE ONE TABLET BY MOUTH ONCE DAILY AT 6PM. 30 tablet 6  . carvedilol (COREG) 25 MG tablet Take 25 mg by mouth 2 (two) times daily with a meal.   11  . Dextromethorphan-Guaifenesin (MUCINEX DM MAXIMUM STRENGTH) 60-1200 MG TB12 Take 1 tablet by mouth daily.    . furosemide (LASIX) 40 MG tablet TAKE ONE TABLET BY MOUTH TWICE DAILY. 60 tablet 6  . guaiFENesin-dextromethorphan (ROBITUSSIN DM) 100-10 MG/5ML syrup Take 5 mLs by mouth every 8 (eight) hours as needed for cough.     Marland Kitchen LORazepam (ATIVAN) 2 MG tablet Take 2 mg by mouth 2 (two) times daily.     Marland Kitchen oxyCODONE-acetaminophen (PERCOCET/ROXICET) 5-325 MG tablet Take 1 tablet by mouth 3 (three) times daily as needed (pain).     . OXYGEN 2lpm with sleep and exertion if needed [facility]    . pantoprazole (PROTONIX) 40 MG tablet TAKE 1 TABLET BY MOUTH DAILY BEFORE BREAKFAST. 90 tablet 3  . Phenyleph-Doxylamine-DM-APAP (NYQUIL SEVERE COLD/FLU PO) Take 1 Dose by mouth at bedtime.    . potassium chloride SA (K-DUR,KLOR-CON) 20 MEQ tablet Take 20 mEq by mouth daily.     Marland Kitchen rOPINIRole (REQUIP) 0.25 MG tablet Take 0.25-0.75 mg by mouth at bedtime.   1  . sodium chloride (OCEAN) 0.65 % SOLN nasal spray Place 1 spray into both nostrils as needed for congestion.    Marland Kitchen  Tiotropium Bromide-Olodaterol (STIOLTO RESPIMAT) 2.5-2.5 MCG/ACT AERS Inhale 2 puffs into the lungs daily. 1 Inhaler 11   No current facility-administered medications for this encounter.     Physical Findings:  height is 5\' 10"  (1.778 m) and weight is 199 lb 6.4 oz (90.4 kg). His oral temperature is 98.3 F (36.8 C). His blood pressure is 157/75 (abnormal) and his pulse is 60. His respiration is 20 and oxygen saturation is 97%.  Pain Assessment Pain Score: 0-No pain/10 In general this is a well appearing Caucasian male in no acute distress.  He's alert and oriented x4 and appropriate throughout the examination. Cardiopulmonary assessment is negative for acute distress and he exhibits normal  effort.   Lab Findings: Lab Results  Component Value Date   WBC 10.0 12/28/2017   HGB 13.6 12/28/2017   HCT 41.3 12/28/2017   MCV 98.8 12/28/2017   PLT 194 12/28/2017     Radiographic Findings: Ct Chest W Contrast  Result Date: 03/03/2018 CLINICAL DATA:  Lung cancer, status post XRT EXAM: CT CHEST WITH CONTRAST TECHNIQUE: Multidetector CT imaging of the chest was performed during intravenous contrast administration. CONTRAST:  57mL ISOVUE-300 IOPAMIDOL (ISOVUE-300) INJECTION 61% COMPARISON:  PET-CT dated 11/30/2017 FINDINGS: Cardiovascular: The heart is normal in size. No pericardial effusion. No evidence of thoracic aortic aneurysm. Atherosclerotic calcifications of the aortic arch. Three vessel coronary atherosclerosis. Left subclavian pacemaker. Mediastinum/Nodes: Small mediastinal lymph nodes, measuring up to 10 mm in the right paratracheal region and right hilar region. These were non FDG avid on prior PET. Visualized thyroid is grossly unremarkable. Lungs/Pleura: 1.3 x 2.2 cm spiculated nodule in the posterior right upper lobe (series 4/image 58), corresponding to the patient's known primary bronchogenic neoplasm, previously 1.7 x 2.4 mm. Nodule abuts/involves the major fissure. Adjacent 7 mm satellite nodule (series 4/image 55), grossly unchanged. Mild centrilobular and paraseptal emphysematous changes, upper lobe predominant. No focal consolidation. No pleural effusion or pneumothorax. Upper Abdomen: Visualized upper abdomen is notable for mild nodular thickening of the left adrenal gland (series 2/image 155), without associated hypermetabolism on prior PET. Musculoskeletal: Degenerative changes of the visualized thoracolumbar spine. IMPRESSION: 1.3 x 2.2 cm spiculated nodule in the posterior right upper lobe, corresponding to the patient's known primary bronchogenic neoplasm, mildly improved status post XRT. Adjacent 7 mm satellite nodule, grossly unchanged. Small mediastinal and right hilar  nodes measuring up to 10 mm short axis, non FDG avid on prior PET. Aortic Atherosclerosis (ICD10-I70.0) and Emphysema (ICD10-J43.9). Electronically Signed   By: Julian Hy M.D.   On: 03/03/2018 15:54    Impression/Plan: 1. 67 y.o.gentleman with 2.4 cm RUL lung nodule, putative stage I NSCLC.  The patient appears to be doing well following radiotherapy. His recent follow up CT Chest from 03/03/18 shows a positive treatment response and no evidence of disease progression.  We will proceed with serial imaging at six-month intervals until 5 years when, at that point in time, he will have an annual low dose CT scan for surveillance. He prefers to have his imaging performed at [facility] since this is closer to his home.  He will return in 6 months to review the findings from the scan to be ordered prior to that visit.    Nicholos Johns, PA-C

## 2018-03-21 DIAGNOSIS — H2513 Age-related nuclear cataract, bilateral: Secondary | ICD-10-CM | POA: Diagnosis not present

## 2018-03-21 DIAGNOSIS — H40033 Anatomical narrow angle, bilateral: Secondary | ICD-10-CM | POA: Diagnosis not present

## 2018-04-11 DIAGNOSIS — Z720 Tobacco use: Secondary | ICD-10-CM | POA: Diagnosis not present

## 2018-04-11 DIAGNOSIS — J449 Chronic obstructive pulmonary disease, unspecified: Secondary | ICD-10-CM | POA: Diagnosis not present

## 2018-04-11 DIAGNOSIS — J060 Acute laryngopharyngitis: Secondary | ICD-10-CM | POA: Diagnosis not present

## 2018-04-11 DIAGNOSIS — Z6829 Body mass index (BMI) 29.0-29.9, adult: Secondary | ICD-10-CM | POA: Diagnosis not present

## 2018-04-13 DIAGNOSIS — H353131 Nonexudative age-related macular degeneration, bilateral, early dry stage: Secondary | ICD-10-CM | POA: Diagnosis not present

## 2018-04-13 DIAGNOSIS — H25812 Combined forms of age-related cataract, left eye: Secondary | ICD-10-CM | POA: Diagnosis not present

## 2018-04-25 DIAGNOSIS — R918 Other nonspecific abnormal finding of lung field: Secondary | ICD-10-CM | POA: Diagnosis not present

## 2018-04-25 DIAGNOSIS — E782 Mixed hyperlipidemia: Secondary | ICD-10-CM | POA: Diagnosis not present

## 2018-04-25 DIAGNOSIS — J060 Acute laryngopharyngitis: Secondary | ICD-10-CM | POA: Diagnosis not present

## 2018-04-25 DIAGNOSIS — J449 Chronic obstructive pulmonary disease, unspecified: Secondary | ICD-10-CM | POA: Diagnosis not present

## 2018-04-25 DIAGNOSIS — G894 Chronic pain syndrome: Secondary | ICD-10-CM | POA: Diagnosis not present

## 2018-04-27 DIAGNOSIS — F410 Panic disorder [episodic paroxysmal anxiety] without agoraphobia: Secondary | ICD-10-CM | POA: Diagnosis not present

## 2018-04-27 DIAGNOSIS — G8929 Other chronic pain: Secondary | ICD-10-CM | POA: Diagnosis not present

## 2018-04-27 DIAGNOSIS — Z9849 Cataract extraction status, unspecified eye: Secondary | ICD-10-CM | POA: Diagnosis not present

## 2018-04-27 DIAGNOSIS — Z6826 Body mass index (BMI) 26.0-26.9, adult: Secondary | ICD-10-CM | POA: Diagnosis not present

## 2018-04-27 DIAGNOSIS — G2581 Restless legs syndrome: Secondary | ICD-10-CM | POA: Diagnosis not present

## 2018-04-27 DIAGNOSIS — I5032 Chronic diastolic (congestive) heart failure: Secondary | ICD-10-CM | POA: Diagnosis not present

## 2018-04-27 DIAGNOSIS — M545 Low back pain: Secondary | ICD-10-CM | POA: Diagnosis not present

## 2018-04-27 DIAGNOSIS — Z720 Tobacco use: Secondary | ICD-10-CM | POA: Diagnosis not present

## 2018-04-27 DIAGNOSIS — F411 Generalized anxiety disorder: Secondary | ICD-10-CM | POA: Diagnosis not present

## 2018-04-27 DIAGNOSIS — J441 Chronic obstructive pulmonary disease with (acute) exacerbation: Secondary | ICD-10-CM | POA: Diagnosis not present

## 2018-04-27 DIAGNOSIS — I10 Essential (primary) hypertension: Secondary | ICD-10-CM | POA: Diagnosis not present

## 2018-04-27 DIAGNOSIS — R911 Solitary pulmonary nodule: Secondary | ICD-10-CM | POA: Diagnosis not present

## 2018-05-03 ENCOUNTER — Ambulatory Visit (INDEPENDENT_AMBULATORY_CARE_PROVIDER_SITE_OTHER): Payer: Medicare Other | Admitting: *Deleted

## 2018-05-03 DIAGNOSIS — I441 Atrioventricular block, second degree: Secondary | ICD-10-CM | POA: Diagnosis not present

## 2018-05-04 DIAGNOSIS — H2512 Age-related nuclear cataract, left eye: Secondary | ICD-10-CM | POA: Diagnosis not present

## 2018-05-04 DIAGNOSIS — H25812 Combined forms of age-related cataract, left eye: Secondary | ICD-10-CM | POA: Diagnosis not present

## 2018-05-04 NOTE — Progress Notes (Signed)
Remote pacemaker transmission.   

## 2018-05-05 LAB — CUP PACEART REMOTE DEVICE CHECK
Battery Remaining Longevity: 130 mo
Brady Statistic AP VP Percent: 29 %
Brady Statistic AP VS Percent: 0 %
Brady Statistic AS VP Percent: 71 %
Brady Statistic AS VS Percent: 0 %
Implantable Lead Implant Date: 20161122
Implantable Lead Implant Date: 20161122
Implantable Lead Location: 753860
Implantable Lead Model: 5076
Implantable Pulse Generator Implant Date: 20161122
Lead Channel Impedance Value: 570 Ohm
Lead Channel Pacing Threshold Amplitude: 0.625 V
Lead Channel Pacing Threshold Amplitude: 0.625 V
Lead Channel Pacing Threshold Pulse Width: 0.4 ms
Lead Channel Pacing Threshold Pulse Width: 0.4 ms
Lead Channel Setting Pacing Amplitude: 2 V
MDC IDC LEAD LOCATION: 753859
MDC IDC MSMT BATTERY IMPEDANCE: 139 Ohm
MDC IDC MSMT BATTERY VOLTAGE: 2.79 V
MDC IDC MSMT LEADCHNL RV IMPEDANCE VALUE: 655 Ohm
MDC IDC SESS DTM: 20190529141553
MDC IDC SET LEADCHNL RV PACING AMPLITUDE: 2.5 V
MDC IDC SET LEADCHNL RV PACING PULSEWIDTH: 0.4 ms
MDC IDC SET LEADCHNL RV SENSING SENSITIVITY: 4 mV

## 2018-05-17 ENCOUNTER — Emergency Department (HOSPITAL_COMMUNITY): Payer: Medicare Other

## 2018-05-17 ENCOUNTER — Other Ambulatory Visit: Payer: Self-pay

## 2018-05-17 ENCOUNTER — Encounter (HOSPITAL_COMMUNITY): Payer: Self-pay | Admitting: Emergency Medicine

## 2018-05-17 ENCOUNTER — Emergency Department (HOSPITAL_COMMUNITY)
Admission: EM | Admit: 2018-05-17 | Discharge: 2018-05-17 | Disposition: A | Discharge status: Home / Self Care | Payer: Medicare Other | Attending: Emergency Medicine | Admitting: Emergency Medicine

## 2018-05-17 DIAGNOSIS — I5022 Chronic systolic (congestive) heart failure: Secondary | ICD-10-CM | POA: Insufficient documentation

## 2018-05-17 DIAGNOSIS — R0600 Dyspnea, unspecified: Secondary | ICD-10-CM | POA: Insufficient documentation

## 2018-05-17 DIAGNOSIS — I110 Hypertensive heart disease with heart failure: Secondary | ICD-10-CM | POA: Diagnosis not present

## 2018-05-17 DIAGNOSIS — I2510 Atherosclerotic heart disease of native coronary artery without angina pectoris: Secondary | ICD-10-CM

## 2018-05-17 DIAGNOSIS — J700 Acute pulmonary manifestations due to radiation: Secondary | ICD-10-CM

## 2018-05-17 DIAGNOSIS — I2584 Coronary atherosclerosis due to calcified coronary lesion: Secondary | ICD-10-CM

## 2018-05-17 DIAGNOSIS — F17210 Nicotine dependence, cigarettes, uncomplicated: Secondary | ICD-10-CM | POA: Diagnosis not present

## 2018-05-17 DIAGNOSIS — R0602 Shortness of breath: Secondary | ICD-10-CM | POA: Diagnosis not present

## 2018-05-17 DIAGNOSIS — J701 Chronic and other pulmonary manifestations due to radiation: Secondary | ICD-10-CM | POA: Insufficient documentation

## 2018-05-17 DIAGNOSIS — Z79899 Other long term (current) drug therapy: Secondary | ICD-10-CM | POA: Diagnosis not present

## 2018-05-17 DIAGNOSIS — I259 Chronic ischemic heart disease, unspecified: Secondary | ICD-10-CM | POA: Diagnosis not present

## 2018-05-17 DIAGNOSIS — Z7982 Long term (current) use of aspirin: Secondary | ICD-10-CM | POA: Insufficient documentation

## 2018-05-17 DIAGNOSIS — W888XXA Exposure to other ionizing radiation, initial encounter: Secondary | ICD-10-CM | POA: Diagnosis not present

## 2018-05-17 DIAGNOSIS — Z8709 Personal history of other diseases of the respiratory system: Secondary | ICD-10-CM | POA: Diagnosis not present

## 2018-05-17 HISTORY — DX: Malignant (primary) neoplasm, unspecified: C80.1

## 2018-05-17 LAB — CBC WITH DIFFERENTIAL/PLATELET
Basophils Absolute: 0 10*3/uL (ref 0.0–0.1)
Basophils Relative: 0 %
Eosinophils Absolute: 0 10*3/uL (ref 0.0–0.7)
Eosinophils Relative: 1 %
HEMATOCRIT: 34.9 % — AB (ref 39.0–52.0)
HEMOGLOBIN: 10.9 g/dL — AB (ref 13.0–17.0)
LYMPHS PCT: 10 %
Lymphs Abs: 0.8 10*3/uL (ref 0.7–4.0)
MCH: 31.1 pg (ref 26.0–34.0)
MCHC: 31.2 g/dL (ref 30.0–36.0)
MCV: 99.4 fL (ref 78.0–100.0)
MONO ABS: 0.5 10*3/uL (ref 0.1–1.0)
MONOS PCT: 7 %
NEUTROS ABS: 6.3 10*3/uL (ref 1.7–7.7)
NEUTROS PCT: 82 %
Platelets: 188 10*3/uL (ref 150–400)
RBC: 3.51 MIL/uL — ABNORMAL LOW (ref 4.22–5.81)
RDW: 14.4 % (ref 11.5–15.5)
WBC: 7.7 10*3/uL (ref 4.0–10.5)

## 2018-05-17 LAB — COMPREHENSIVE METABOLIC PANEL
ALK PHOS: 91 U/L (ref 38–126)
ALT: 20 U/L (ref 17–63)
ANION GAP: 11 (ref 5–15)
AST: 24 U/L (ref 15–41)
Albumin: 3.4 g/dL — ABNORMAL LOW (ref 3.5–5.0)
BILIRUBIN TOTAL: 1.8 mg/dL — AB (ref 0.3–1.2)
BUN: 14 mg/dL (ref 6–20)
CALCIUM: 8.5 mg/dL — AB (ref 8.9–10.3)
CO2: 31 mmol/L (ref 22–32)
Chloride: 91 mmol/L — ABNORMAL LOW (ref 101–111)
Creatinine, Ser: 1.11 mg/dL (ref 0.61–1.24)
GFR calc Af Amer: >60 mL/min (ref >60)
Glucose, Bld: 101 mg/dL — ABNORMAL HIGH (ref 65–99)
POTASSIUM: 4 mmol/L (ref 3.5–5.1)
Sodium: 133 mmol/L — ABNORMAL LOW (ref 135–145)
TOTAL PROTEIN: 7.6 g/dL (ref 6.5–8.1)

## 2018-05-17 LAB — BRAIN NATRIURETIC PEPTIDE: B Natriuretic Peptide: 262 pg/mL — ABNORMAL HIGH (ref 0.0–100.0)

## 2018-05-17 LAB — TROPONIN I

## 2018-05-17 MED ORDER — FUROSEMIDE 10 MG/ML IJ SOLN
40.0000 mg | Freq: Once | INTRAMUSCULAR | Status: AC
Start: 2018-05-17 — End: 2018-05-17
  Administered 2018-05-17: 40 mg via INTRAVENOUS
  Filled 2018-05-17: qty 4

## 2018-05-17 MED ORDER — IBUPROFEN 800 MG PO TABS: 800.0000 mg | ORAL_TABLET | Freq: Three times a day (TID) | ORAL | 0 refills | Status: DC

## 2018-05-17 MED ORDER — IOPAMIDOL (ISOVUE-370) INJECTION 76%
100.0000 mL | Freq: Once | INTRAVENOUS | Status: AC | PRN
  Administered 2018-05-17: 100 mL via INTRAVENOUS

## 2018-05-17 MED ORDER — ALBUTEROL SULFATE (2.5 MG/3ML) 0.083% IN NEBU
5.0000 mg | INHALATION_SOLUTION | Freq: Once | RESPIRATORY_TRACT | Status: AC
  Administered 2018-05-17: 5 mg via RESPIRATORY_TRACT
  Filled 2018-05-17: qty 6

## 2018-05-17 NOTE — ED Triage Notes (Signed)
Pt c/o sob for weeks. ble swelling for weeks. Mild sob noted. Denies chest pain. Pt c/o pain to right of navel intermittent, no pain now. Non diaphoretic.

## 2018-05-17 NOTE — ED Provider Notes (Signed)
[facility] EMERGENCY DEPARTMENT Provider Note   CSN: [id number] Arrival date & time: 05/17/18  1429     History   Chief Complaint Chief Complaint  Patient presents with  . Shortness of Breath    HPI Shaun May is a 67 y.o. male.  HPI 67 year old man history of CHF, COPD, status post pacemaker placement and anemia after bleed first while on Xarelto presents today with increasing dyspnea over the past 2 to 3 weeks.  He states that he has had increased swelling in his lower extremities.  He has not had any change in sputum production or cough.  He denies any fever or chills.  He denies history of DVT or PE.  He denies any recent immobilizations or long trips.  He endorses orthopnea and dyspnea on exertion. Past Medical History:  Diagnosis Date  . Anemia    when on Xarelto  . Cancer ([place])   . CHF (congestive heart failure) ([place])   . COPD (chronic obstructive pulmonary disease) (HCC)    stage 4 COPD  . Dyspnea   . Hemochromatosis   . Hypertension   . Lung nodule   . Mobitz type 2 second degree heart block    PPM MDT 10/28/15 Dr. Lovena Le  . Pericardial effusion   . Persistent atrial fibrillation ([place])   . Presence of permanent cardiac pacemaker    [employer]    Patient Active Problem List   Diagnosis Date Noted  . Chronic respiratory failure with hypoxia (HCC)/ nocturnal hypoxemia  08/15/2017  . Symptomatic anemia 05/25/2016  . Anemia 05/25/2016  . Hypokalemia 05/25/2016  . Hyponatremia 05/25/2016  . Atrial flutter ([place]), paroxysmal   . Pericardial effusion   . Acute on chronic systolic (congestive) heart failure ([place]) 03/29/2016  . 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 Elevated troponin 03/29/2016  . Restless leg syndrome 03/29/2016  . Presumed cancer of right upper lobe of lung ([place]) 03/29/2016  . Chest pain 03/27/2016  . COPD exacerbation ([place]) 03/27/2016  . Hypertension   . Essential hypertension   . Bradycardia 10/25/2015  . Heart block  AV second degree 10/25/2015  . Accelerated hypertension 10/25/2015  . Hemochromatosis 10/25/2015  . Cigarette smoker 10/25/2015  . Alcohol dependence ([place]) 10/25/2015  . Second degree AV block 10/25/2015    Past Surgical History:  Procedure Laterality Date  . BIOPSY  10/13/2016   Procedure: BIOPSY;  Surgeon: Rogene Houston, MD;  [facility];  Service: Endoscopy;;  esophageal  . CARDIAC CATHETERIZATION N/A 03/31/2016   Procedure: Left Heart Cath and Coronary Angiography;  Surgeon: Wellington Hampshire, MD;  Location: [place] [facility];  Service: Cardiovascular;  Laterality: N/A;  . COLONOSCOPY N/A 10/13/2016   Procedure: COLONOSCOPY;  Surgeon: Rogene Houston, MD;  Location: [facility];  Service: Endoscopy;  Laterality: N/A;  . EP IMPLANTABLE DEVICE N/A 10/28/2015   Procedure: Pacemaker Implant;  Surgeon: Evans Lance, MD;  Location: [place] [facility];  Service: Cardiovascular;  Laterality: N/A;  . EP IMPLANTABLE DEVICE N/A 11/07/2015   Procedure: Pocket Revision;  Surgeon: Evans Lance, MD;  Location: [place] [facility];  Service: Cardiovascular;  Laterality: N/A;  . ESOPHAGOGASTRODUODENOSCOPY N/A 05/27/2016   Procedure: ESOPHAGOGASTRODUODENOSCOPY (EGD);  Surgeon: Rogene Houston, MD;  [facility];  Service: Endoscopy;  Laterality: N/A;  . ESOPHAGOGASTRODUODENOSCOPY N/A 10/13/2016   Procedure: ESOPHAGOGASTRODUODENOSCOPY (EGD);  Surgeon: Rogene Houston, MD;  [facility];  Service: Endoscopy;  Laterality: N/A;  1:30 - moved to 11/8 @ 1:00 - Ann notified pt  . FUDUCIAL PLACEMENT Right 12/29/2017   Procedure: PLACEMENT OF FUDUCIAL;  Surgeon: [name], MD;  Location: [place];  Service: Thoracic;  Laterality: Right;  . POLYPECTOMY  10/13/2016   Procedure: POLYPECTOMY;  Surgeon: Rogene Houston, MD;  [facility];  Service: Endoscopy;;  transverse colon polypectomy  . TONSILLECTOMY    . VIDEO BRONCHOSCOPY WITH ENDOBRONCHIAL NAVIGATION  Right 12/29/2017   Procedure: VIDEO BRONCHOSCOPY WITH ENDOBRONCHIAL NAVIGATION;  Surgeon: [name], MD;  Location: [place];  Service: Thoracic;  Laterality: Right;        Home Medications    Prior to Admission medications   Medication Sig Start Date End Date Taking? Authorizing Provider  albuterol (PROVENTIL HFA;VENTOLIN HFA) 108 (90 Base) MCG/ACT inhaler Inhale 1-2 puffs into the lungs every 6 (six) hours as needed for wheezing or shortness of breath. 08/01/17   Fredia Sorrow, MD  aspirin EC 81 MG tablet Take 81 mg by mouth daily.    [provider]  atorvastatin (LIPITOR) 40 MG tablet TAKE ONE TABLET BY MOUTH ONCE DAILY AT 6PM. 09/22/16   Herminio Commons, MD  carvedilol (COREG) 25 MG tablet Take 25 mg by mouth 2 (two) times daily with a meal.  01/23/16   [provider]  Dextromethorphan-Guaifenesin (MUCINEX DM MAXIMUM STRENGTH) 60-1200 MG TB12 Take 1 tablet by mouth daily.    [provider]  furosemide (LASIX) 40 MG tablet TAKE ONE TABLET BY MOUTH TWICE DAILY. 09/23/17   Evans Lance, MD  guaiFENesin-dextromethorphan (ROBITUSSIN DM) 100-10 MG/5ML syrup Take 5 mLs by mouth every 8 (eight) hours as needed for cough.     [provider]  LORazepam (ATIVAN) 2 MG tablet Take 2 mg by mouth 2 (two) times daily.  10/01/15   [provider]  oxyCODONE-acetaminophen (PERCOCET/ROXICET) 5-325 MG tablet Take 1 tablet by mouth 3 (three) times daily as needed (pain).     [provider]  OXYGEN 2lpm with sleep and exertion if needed [facility]    [provider]  pantoprazole (PROTONIX) 40 MG tablet TAKE 1 TABLET BY MOUTH DAILY BEFORE BREAKFAST. 12/12/17   Rehman, Mechele Dawley, MD  Phenyleph-Doxylamine-DM-APAP (NYQUIL SEVERE COLD/FLU PO) Take 1 Dose by mouth at bedtime.    [provider]  potassium chloride SA (K-DUR,KLOR-CON) 20 MEQ tablet Take 20 mEq by mouth daily.  10/14/15   [provider]  rOPINIRole (REQUIP)  0.25 MG tablet Take 0.25-0.75 mg by mouth at bedtime.  02/25/16   [provider]  sodium chloride (OCEAN) 0.65 % SOLN nasal spray Place 1 spray into both nostrils as needed for congestion.    [provider]  Tiotropium Bromide-Olodaterol (STIOLTO RESPIMAT) 2.5-2.5 MCG/ACT AERS Inhale 2 puffs into the lungs daily. 09/16/17   Tanda Rockers, MD    Family History Family History  Problem Relation Age of Onset  . Diabetes Other   . Dementia Mother     Social History Social History   Tobacco Use  . Smoking status: Current Every Day Smoker    Packs/day: 2.00    Years: 56.00    Pack years: 112.00    Types: E-cigarettes, Cigarettes  . Smokeless tobacco: Never Used  Substance Use Topics  . Alcohol use: Yes    Alcohol/week: 1.8 oz    Types: 3 Shots of liquor per week    Comment: nightly  . Drug  use: No     Allergies   Xarelto [rivaroxaban]   Review of Systems Review of Systems  All other systems reviewed and are negative.    Physical Exam Updated Vital Signs BP (!) 127/92   Pulse (!) 58   Temp 97.9 F (36.6 C) (Oral)   Resp (!) 26   SpO2 100%   Physical Exam  Constitutional: He is oriented to person, place, and time. He appears well-developed and well-nourished. He does not appear ill.  HENT:  Head: Normocephalic and atraumatic.  Mouth/Throat: Oropharynx is clear and moist.  Eyes: Pupils are equal, round, and reactive to light. EOM are normal.  Neck: Normal range of motion. Neck supple.  Cardiovascular: Normal rate and regular rhythm.  Pulmonary/Chest: Effort normal. He has wheezes in the right middle field and the right lower field. He has rhonchi.  Abdominal: Soft. Bowel sounds are normal. He exhibits distension.  Musculoskeletal: Normal range of motion.       Right lower leg: He exhibits edema.       Left lower leg: He exhibits edema.  Neurological: He is alert and oriented to person, place, and time.  Skin: Skin is warm. Capillary refill  takes less than 2 seconds.  Psychiatric: He has a normal mood and affect. His behavior is normal.  Nursing note and vitals reviewed.    ED Treatments / Results  Labs (all labs ordered are listed, but only abnormal results are displayed) Labs Reviewed  CBC WITH DIFFERENTIAL/PLATELET - Abnormal; Notable for the following components:      Result Value   RBC 3.51 (*)    Hemoglobin 10.9 (*)    HCT 34.9 (*)    All other components within normal limits  COMPREHENSIVE METABOLIC PANEL  TROPONIN I    EKG EKG Interpretation  Date/Time:  Wednesday May 17 2018 14:41:40 EDT Ventricular Rate:  62 PR Interval:    QRS Duration: 174 QT Interval:  460 QTC Calculation: 468 R Axis:   172 Text Interpretation:  Atrial-ventricular dual-paced complexes No further analysis attempted due to paced rhythm Confirmed by Pattricia Boss [phone]) on 05/17/2018 3:36:50 PM   Radiology Dg Chest 2 View  Result Date: 05/17/2018 CLINICAL DATA:  Shortness of breath and leg swelling. History of lung cancer, COPD, CHF. EXAM: CHEST - 2 VIEW COMPARISON:  Chest radiograph May 08, 2018 FINDINGS: Cardiac silhouette is mildly enlarged. Mediastinal silhouette is not suspicious, calcified aortic arch. Dual lead LEFT cardiac pacemaker in situ. Masslike RIGHT upper lobe airspace opacity with new surgical clip/fiducial markers. Pulmonary vascular congestion appears normal. No pleural effusion. No pneumothorax. Moderate degenerative changes thoracic spine. IMPRESSION: More conspicuous masslike airspace opacity RIGHT upper lobe with fiducial marker/surgical clips. Tumor progression is possible. Stable cardiomegaly. Electronically Signed   By: Elon Alas M.D.   On: 05/17/2018 16:15   Ct Angio Chest Pe W And/or Wo Contrast  Result Date: 05/17/2018 CLINICAL DATA:  67 year old male with history of chronic shortness of breath worsening over the past 2-3 days. EXAM: CT ANGIOGRAPHY CHEST WITH CONTRAST TECHNIQUE: Multidetector CT  imaging of the chest was performed using the standard protocol during bolus administration of intravenous contrast. Multiplanar CT image reconstructions and MIPs were obtained to evaluate the vascular anatomy. CONTRAST:  177mL ISOVUE-370 IOPAMIDOL (ISOVUE-370) INJECTION 76% COMPARISON:  None. FINDINGS: Cardiovascular: Heart size is normal. Small amount of pericardial fluid and/or thickening with subtle pericardial enhancement. No pericardial calcification. There is aortic atherosclerosis, as well as atherosclerosis of the great vessels of the mediastinum  and the coronary arteries, including calcified atherosclerotic plaque in the left main, left anterior descending, left circumflex and right coronary arteries. Mediastinum/Nodes: No pathologically enlarged mediastinal or hilar lymph nodes. Esophagus is unremarkable in appearance. No axillary lymphadenopathy. Lungs/Pleura: Fiducial markers are noted in the right upper lobe around the previously noted right upper lobe nodule. On today's examination, the nodule is less well-defined than the prior study, but measures approximately 2.1 x 1.5 cm. Surrounding ground-glass attenuation and septal thickening is most compatible with evolving postradiation changes. No other suspicious appearing pulmonary nodules or masses are noted. No consolidative airspace disease elsewhere in the lungs. Mild diffuse bronchial wall thickening with mild centrilobular and paraseptal emphysema. Trace right pleural effusion lying dependently. Upper Abdomen: Aortic atherosclerosis. Diffuse low attenuation throughout the visualized portions of the hepatic parenchyma, indicative of hepatic steatosis. Musculoskeletal: There are no aggressive appearing lytic or blastic lesions noted in the visualized portions of the skeleton. Review of the MIP images confirms the above findings. IMPRESSION: 1. No evidence of pulmonary embolism. 2. Evolving postradiation changes in the right upper lobe adjacent to the  previously noted right upper lobe nodule, most compatible with evolving postradiation pneumonitis. 3. Small amount of pericardial fluid and/or thickening with some pericardial enhancement. Clinical correlation for signs and symptoms of pericarditis is recommended. 4. Aortic atherosclerosis, in addition to left main and 3 vessel coronary artery disease. Please note that although the presence of coronary artery calcium documents the presence of coronary artery disease, the severity of this disease and any potential stenosis cannot be assessed on this non-gated CT examination. Assessment for potential risk factor modification, dietary therapy or pharmacologic therapy may be warranted, if clinically indicated. 5. Mild diffuse bronchial wall thickening with mild centrilobular and paraseptal emphysema; imaging findings suggestive of underlying COPD. 6. Hepatic steatosis. Aortic Atherosclerosis (ICD10-I70.0) and Emphysema (ICD10-J43.9). Electronically Signed   By: Vinnie Langton M.D.   On: 05/17/2018 18:13    Procedures Procedures (including critical care time)  Medications Ordered in ED Medications  albuterol (PROVENTIL) (2.5 MG/3ML) 0.083% nebulizer solution 5 mg (5 mg Nebulization Given 05/17/18 1508)     Initial Impression / Assessment and Plan / ED Course  I have reviewed the triage vital signs and the nursing notes.  Pertinent labs & imaging results that were available during my care of the patient were reviewed by me and considered in my medical decision making (see chart for details).     Patient with copd, smoker, chf, recent lung cancer surgery with post surgical radiation presents with ongoing gradual increase in dyspnea.  Patient without evidence for cardiac ischemia, pneumonia, severe copd exacerbation, but has some mild increase in fluid with peripheral edema and bnp 262.  CT angio without evidence of pe.  Some postradiation changes seen, ?small pericardial effusion, and likely some  component of his copd.  Plan lasix here, nsaid for possible pericarditis although this seems most cw his radiation cannot rule out other causes. Plan cardiology referral re coronary calcifications on ct.  Discussed above with patient and he will call oncologist for follow up.  Advised of return precautions and need for close follow up.  CRITICAL CARE Performed by: Pattricia Boss Total critical care time: 30 minutes Critical care time was exclusive of separately billable procedures and treating other patients. Critical care was necessary to treat or prevent imminent or life-threatening deterioration. Critical care was time spent personally by me on the following activities: development of treatment plan with patient and/or surrogate as well as nursing,  discussions with consultants, evaluation of patient's response to treatment, examination of patient, obtaining history from patient or surrogate, ordering and performing treatments and interventions, ordering and review of laboratory studies, ordering and review of radiographic studies, pulse oximetry and re-evaluation of patient's condition.  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

## 2018-05-17 NOTE — Discharge Instructions (Addendum)
Drink plenty of water Stop smoking Call your oncologist tomorrow for recheck Take all your medications as prescribed Call cardiology for recheck Return if worsening shortness of breath, fever, or chest pain

## 2018-05-18 ENCOUNTER — Ambulatory Visit (INDEPENDENT_AMBULATORY_CARE_PROVIDER_SITE_OTHER): Payer: Medicare Other | Admitting: Cardiovascular Disease

## 2018-05-18 ENCOUNTER — Encounter: Payer: Self-pay | Admitting: Cardiovascular Disease

## 2018-05-18 VITALS — BP 138/80 | HR 70 | Ht 70.0 in | Wt 206.2 lb

## 2018-05-18 DIAGNOSIS — Z9289 Personal history of other medical treatment: Secondary | ICD-10-CM | POA: Diagnosis not present

## 2018-05-18 DIAGNOSIS — I313 Pericardial effusion (noninflammatory): Secondary | ICD-10-CM

## 2018-05-18 DIAGNOSIS — J449 Chronic obstructive pulmonary disease, unspecified: Secondary | ICD-10-CM

## 2018-05-18 DIAGNOSIS — I429 Cardiomyopathy, unspecified: Secondary | ICD-10-CM

## 2018-05-18 DIAGNOSIS — C3490 Malignant neoplasm of unspecified part of unspecified bronchus or lung: Secondary | ICD-10-CM | POA: Diagnosis not present

## 2018-05-18 DIAGNOSIS — Z720 Tobacco use: Secondary | ICD-10-CM | POA: Diagnosis not present

## 2018-05-18 DIAGNOSIS — I4892 Unspecified atrial flutter: Secondary | ICD-10-CM | POA: Diagnosis not present

## 2018-05-18 DIAGNOSIS — R600 Localized edema: Secondary | ICD-10-CM

## 2018-05-18 DIAGNOSIS — I5023 Acute on chronic systolic (congestive) heart failure: Secondary | ICD-10-CM | POA: Diagnosis not present

## 2018-05-18 DIAGNOSIS — I2510 Atherosclerotic heart disease of native coronary artery without angina pectoris: Secondary | ICD-10-CM

## 2018-05-18 DIAGNOSIS — Z950 Presence of cardiac pacemaker: Secondary | ICD-10-CM | POA: Diagnosis not present

## 2018-05-18 DIAGNOSIS — I3139 Other pericardial effusion (noninflammatory): Secondary | ICD-10-CM

## 2018-05-18 DIAGNOSIS — I2584 Coronary atherosclerosis due to calcified coronary lesion: Secondary | ICD-10-CM | POA: Diagnosis not present

## 2018-05-18 NOTE — Progress Notes (Signed)
SUBJECTIVE: The patient presents for routine follow-up. He has a history of chronic systolic heart failure, nonischemic cardiomyopathy, pacemaker, COPD, alcohol and tobacco abuse, and pericardial effusion.  Echocardiogram 06/14/16 demonstrated mildly reduced left ventricular systolic function, LVEF 65%, inferior and inferoseptal hypokinesis, mildly reduced right ventricular systolic function, and trivial pericardial effusion.  There was no evidence of obstructive coronary artery disease by coronary angiography 03/31/16.  He has chronic exertional dyspnea and has gold stage IV COPD.  He is diagnosed with non-small cell lung cancer and has received radiation therapy.  He was evaluated in the ED yesterday at [facility] for increasing dyspnea over 2 to 3 weeks.  He also complained of bilateral lower extremity swelling.  He received IV Lasix and NSAIDs.  ED notes mention a plan for cardiology referral regarding coronary artery calcification seen on CT.  I reviewed all relevant documentation, labs, and studies.  BNP was mildly elevated at 262.  Troponin was normal.  Renal function was normal.  Sodium was 133.  WBC count was normal.  Hemoglobin was mildly low at 10.9.  Platelets were normal.  Chest x-ray showed masslike airspace opacity in the right upper lobe with stable cardiomegaly.  I reviewed the CT angiogram of the chest which showed no evidence of pulmonary embolism.  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  There was a small amount of pericardial fluid and/or thickening with some pericardial enhancement.  Clinical correlation for signs and symptoms of pericarditis was recommended.  There were coronary artery calcifications with aortic atherosclerosis with left main and three-vessel coronary disease noted.  There was mild diffuse bronchial  wall thickening with mild centrilobular and paraseptal emphysema suggestive of underlying COPD.  He has had progressive development of bilateral leg edema.  He thinks it has been going on for months.  He presently takes Lasix 40 mg twice daily along with supplemental potassium.  He also complains of progressive exertional dyspnea.  He denies chest pain.  He continues to smoke over a pack and a half of cigarettes daily.   Review of Systems: As per "subjective", otherwise negative.  Allergies  Allergen Reactions  . Xarelto [Rivaroxaban] Other (See Comments)    Caused bleeding.     Current Outpatient Medications  Medication Sig Dispense Refill  . albuterol (PROVENTIL HFA;VENTOLIN HFA) 108 (90 Base) MCG/ACT inhaler Inhale 1-2 puffs into the lungs every 6 (six) hours as needed for wheezing or shortness of breath. 1 Inhaler 0  . aspirin EC 81 MG tablet Take 81 mg by mouth daily.    Marland Kitchen atorvastatin (LIPITOR) 40 MG tablet TAKE ONE TABLET BY MOUTH ONCE DAILY AT 6PM. 30 tablet 6  . carvedilol (COREG) 25 MG tablet Take 25 mg by mouth 2 (two) times daily with a meal.   11  . Dextromethorphan-Guaifenesin (MUCINEX DM MAXIMUM STRENGTH) 60-1200 MG TB12 Take 1 tablet by mouth daily.    . furosemide (LASIX) 40 MG tablet TAKE ONE TABLET BY MOUTH TWICE DAILY. 60 tablet 6  . guaiFENesin-dextromethorphan (ROBITUSSIN DM) 100-10 MG/5ML syrup Take 5 mLs by mouth every 8 (eight) hours as needed for cough.     Marland Kitchen ibuprofen (ADVIL,MOTRIN) 800 MG tablet Take 1 tablet (800 mg total) by mouth 3 (three) times daily. 21 tablet 0  . LORazepam (ATIVAN) 2 MG tablet Take 2 mg by mouth 2 (two) times daily.     Marland Kitchen  oxyCODONE-acetaminophen (PERCOCET/ROXICET) 5-325 MG tablet Take 1 tablet by mouth 2 (two) times daily. *May take one tablet three times daily as needed for pain    . OXYGEN Inhale 2 L into the lungs at bedtime. **2L with sleep and exertion if needed    . pantoprazole (PROTONIX) 40 MG tablet TAKE 1 TABLET BY MOUTH DAILY  BEFORE BREAKFAST. 90 tablet 3  . Phenyleph-Doxylamine-DM-APAP (NYQUIL SEVERE COLD/FLU PO) Take 1 Dose by mouth at bedtime.    . potassium chloride SA (K-DUR,KLOR-CON) 20 MEQ tablet Take 20 mEq by mouth daily.     Marland Kitchen rOPINIRole (REQUIP) 0.25 MG tablet Take 0.25-0.75 mg by mouth at bedtime.   1  . sodium chloride (OCEAN) 0.65 % SOLN nasal spray Place 1 spray into both nostrils as needed for congestion.    Marland Kitchen umeclidinium-vilanterol (ANORO ELLIPTA) 62.5-25 MCG/INH AEPB Inhale 1 puff into the lungs daily.     No current facility-administered medications for this visit.     Past Medical History:  Diagnosis Date  . Anemia    when on Xarelto  . Cancer ([place])   . CHF (congestive heart failure) ([place])   . COPD (chronic obstructive pulmonary disease) ([facility])    stage 4 COPD  . Dyspnea   . Hemochromatosis   . Hypertension   . Lung nodule   . Mobitz type 2 second degree heart block    PPM MDT 10/28/15 Dr. Lovena Le  . Pericardial effusion   . Persistent atrial fibrillation ([place])   . Presence of permanent cardiac pacemaker    [employer]    Past Surgical History:  Procedure Laterality Date  . BIOPSY  10/13/2016   Procedure: BIOPSY;  Surgeon: Rogene Houston, MD;  [facility];  Service: Endoscopy;;  esophageal  . CARDIAC CATHETERIZATION N/A 03/31/2016   Procedure: Left Heart Cath and Coronary Angiography;  Surgeon: Wellington Hampshire, MD;  Location: [place] [facility];  Service: Cardiovascular;  Laterality: N/A;  . COLONOSCOPY N/A 10/13/2016   Procedure: COLONOSCOPY;  Surgeon: Rogene Houston, MD;  [facility];  Service: Endoscopy;  Laterality: N/A;  . EP IMPLANTABLE DEVICE N/A 10/28/2015   Procedure: Pacemaker Implant;  Surgeon: Evans Lance, MD;  Location: [place] [facility];  Service: Cardiovascular;  Laterality: N/A;  . EP IMPLANTABLE DEVICE N/A 11/07/2015   Procedure: Pocket Revision;  Surgeon: Evans Lance, MD;  Location: [place] [facility];  Service: Cardiovascular;   Laterality: N/A;  . ESOPHAGOGASTRODUODENOSCOPY N/A 05/27/2016   Procedure: ESOPHAGOGASTRODUODENOSCOPY (EGD);  Surgeon: Rogene Houston, MD;  [facility];  Service: Endoscopy;  Laterality: N/A;  . ESOPHAGOGASTRODUODENOSCOPY N/A 10/13/2016   Procedure: ESOPHAGOGASTRODUODENOSCOPY (EGD);  Surgeon: Rogene Houston, MD;  [facility];  Service: Endoscopy;  Laterality: N/A;  1:30 - moved to 11/8 @ 1:00 - Ann notified pt  . FUDUCIAL PLACEMENT Right 12/29/2017   Procedure: PLACEMENT OF FUDUCIAL;  Surgeon: [name], MD;  Location: [place];  Service: Thoracic;  Laterality: Right;  . POLYPECTOMY  10/13/2016   Procedure: POLYPECTOMY;  Surgeon: Rogene Houston, MD;  [facility];  Service: Endoscopy;;  transverse colon polypectomy  . TONSILLECTOMY    . VIDEO BRONCHOSCOPY WITH ENDOBRONCHIAL NAVIGATION Right 12/29/2017   Procedure: VIDEO BRONCHOSCOPY WITH ENDOBRONCHIAL NAVIGATION;  Surgeon: [name], MD;  Location: [place];  Service: Thoracic;  Laterality: Right;    Social History   Socioeconomic History  . Marital status: Divorced    Spouse  name: Not on file  . Number of children: Not on file  . Years of education: Not on file  . Highest education level: Not on file  Occupational History  . Not on file  Social Needs  . Financial resource strain: Not on file  . Food insecurity:    Worry: Not on file    Inability: Not on file  . Transportation needs:    Medical: Not on file    Non-medical: Not on file  Tobacco Use  . Smoking status: Current Every Day Smoker    Packs/day: 2.00    Years: 56.00    Pack years: 112.00    Types: E-cigarettes, Cigarettes  . Smokeless tobacco: Never Used  Substance and Sexual Activity  . Alcohol use: Yes    Alcohol/week: 1.8 oz    Types: 3 Shots of liquor per week    Comment: nightly  . Drug use: No  . Sexual activity: Not on file  Lifestyle  . Physical activity:    Days per week: Not on file    Minutes per  session: Not on file  . Stress: Not on file  Relationships  . Social connections:    Talks on phone: Not on file    Gets together: Not on file    Attends religious service: Not on file    Active member of club or organization: Not on file    Attends meetings of clubs or organizations: Not on file    Relationship status: Not on file  . Intimate partner violence:    Fear of current or ex partner: Not on file    Emotionally abused: Not on file    Physically abused: Not on file    Forced sexual activity: Not on file  Other Topics Concern  . Not on file  Social History Narrative  . Not on file     Vitals:   05/18/18 1552  BP: 138/80  Pulse: 70  SpO2: 97%  Weight: 206 lb 3.2 oz (93.5 kg)  Height: 5\' 10"  (1.778 m)    Wt Readings from Last 3 Encounters:  05/18/18 206 lb 3.2 oz (93.5 kg)  03/07/18 199 lb 6.4 oz (90.4 kg)  02/23/18 198 lb 6.4 oz (90 kg)     PHYSICAL EXAM General: NAD HEENT: Some facial plethora is noted. Neck: No JVD, no thyromegaly. Lungs: Poor air movement.  No crackles or wheezes. CV: Regular rate and rhythm, normal S1/S2, no S3/S4, no murmur.  Bilateral 2+ pitting lower extremity edema.  Abdomen: Soft, nontender, no distention.  Neurologic: Alert and oriented.  Psych: Normal affect. Skin: Normal. Musculoskeletal: No gross deformities.    ECG: Most recent ECG reviewed.   Labs: Lab Results  Component Value Date/Time   K 4.0 05/17/2018 02:57 PM   BUN 14 05/17/2018 02:57 PM   BUN 8 02/21/2017 03:33 PM   CREATININE 1.11 05/17/2018 02:57 PM   CREATININE 1.07 04/08/2016 02:40 PM   ALT 20 05/17/2018 02:57 PM   TSH 0.755 05/25/2016 10:10 PM   HGB 10.9 (L) 05/17/2018 02:57 PM   HGB 13.8 02/21/2017 03:33 PM     Lipids: No results found for: LDLCALC, LDLDIRECT, CHOL, TRIG, HDL     ASSESSMENT AND PLAN: 1.  Bilateral lower extremity edema: He is taking Lasix 40 mg twice daily with supplemental potassium and no significant improvement.  His weight  is up 8 pounds since 02/23/2018.  I will switch Lasix to torsemide 20 mg twice daily and increase potassium chloride  to 20 meq twice daily.  I will obtain a basic metabolic panel within a few days of initiation.  I will also obtain an echocardiogram to evaluate cardiac structure and function and to assess for a potential decline in LVEF as well as for a possible pericardial effusion.  2. Pericardial effusion: Trivial in size 06/1016.  I will obtain a follow-up echocardiogram given his symptoms as discussed in #1.  3. Cardiomyopathy/acute on chronic systolic heart failure: EF 45% 06/14/16.  Please refer to discussion and #1.  Am switching Lasix to torsemide and obtaining a follow-up echocardiogram to assess for potential interval decline in LVEF  4. Pacemaker: Followed by Dr. Lovena Le.  Most recent device interrogation reviewed with normal device function.  5. Paroxysmal atrial flutter: On Coreg.Wishes to stay off Xarelto.  Has a history of GI bleeding.  6. Tobacco abuse: Unwilling to quit.  He continues to smoke over a pack and a half of cigarettes daily.  7.  Gold stage IV COPD: He continues to smoke and is unwilling to quit.   8.  Non-small cell lung cancer: Has received radiation therapy.  He has developed radiation pneumonitis.  He will likely require a prednisone taper.  I asked him to speak with his oncologist.  He continues to smoke and is unwilling to quit.    Disposition: Follow up 6 weeks.  Time spent: 40 minutes, of which greater than 50% was spent reviewing symptoms, relevant blood tests and studies, and discussing management plan with the patient.   Kate Sable, M.D., F.A.C.C.

## 2018-05-18 NOTE — Patient Instructions (Signed)
Medication Instructions:  Your physician has recommended you make the following change in your medication:  Stop Lasix  Start Torsemide 20 mg Two Times Daily  Start Potassium 20 meq Two Times Daily    Labwork: Your physician recommends that you return for lab work in: 3 Weeks   Testing/Procedures: Your physician has requested that you have an echocardiogram. Echocardiography is a painless test that uses sound waves to create images of your heart. It provides your doctor with information about the size and shape of your heart and how well your heart's chambers and valves are working. This procedure takes approximately one hour. There are no restrictions for this procedure.    Follow-Up: Your physician recommends that you schedule a follow-up appointment in: 6 Weeks    Any Other Special Instructions Will Be Listed Below (If Applicable).     If you need a refill on your cardiac medications before your next appointment, please call your pharmacy. Thank you for choosing [facility]!

## 2018-05-19 ENCOUNTER — Telehealth: Payer: Self-pay | Admitting: Cardiovascular Disease

## 2018-05-19 MED ORDER — TORSEMIDE 20 MG PO TABS: 20.0000 mg | ORAL_TABLET | Freq: Two times a day (BID) | ORAL | 3 refills | Status: DC

## 2018-05-19 MED ORDER — POTASSIUM CHLORIDE CRYS ER 20 MEQ PO TBCR: 20.0000 meq | EXTENDED_RELEASE_TABLET | Freq: Two times a day (BID) | ORAL | 3 refills | Status: DC

## 2018-05-19 NOTE — Progress Notes (Signed)
Shaun May 67 y.o. man with Non-small cell lung cancer.  He has developed radiation pneumonitis.  He will likely require a prednisone taper.  I asked him to speak with his oncologist. He continues to smoke and is unwilling to quit.   ED visit 6-12-019 for shortness of breath history of CHF, COPD, status post pacemaker placement and anemia after bleed first while on Xarelto presents today with increasing dyspnea over the past 2 to 3 weeks.  He states that he has had increased swelling in his lower extremities.   Request for recheck by cardiologist Dr. Kate Sable.  Stop Lasix  05-19-18 Start Torsemide 20 mg Two Times Daily  Start Potassium 20 meq Two Times Daily   Pacemaker: Followed by Dr. Lovena Le.Most recent device interrogation reviewed with normal device function.   Weight changes, if any: Wt Readings from Last 3 Encounters:  05/23/18 213 lb 3.2 oz (96.7 kg)  05/18/18 206 lb 3.2 oz (93.5 kg)  03/07/18 199 lb 6.4 oz (90.4 kg)   Respiratory complaints, if any: SOB O2 at 2 L/M mostly at night,coughing in the morning phlegm clear to white,wheezung Hemoptysis, if any: No  Swallowing Problems/Pain/Difficulty swallowing:No Smoking Tobacco/Marijuana/Snuff/ETOH use:2 P/D x 56 years alcohol nightly Appetite :Good Pain:2/10 legs oxycodone Legs swelling down to his ankles for a few months.  Legs with red spots down his legs have put ice packs on his legs and keeping elevated in recliner. BP 133/70 (BP Location: Left Arm, Patient Position: Sitting, Cuff Size: Normal)   Pulse (!) 59   Temp 97.6 F (36.4 C) (Oral)   Resp 20   Ht 5\' 10"  (1.778 m)   Wt 213 lb 3.2 oz (96.7 kg)   SpO2 97%   BMI 30.59 kg/m

## 2018-05-19 NOTE — Telephone Encounter (Signed)
Called to notify pt meds have been sent to pharmacy

## 2018-05-19 NOTE — Addendum Note (Signed)
Addended by: Levonne Hubert on: 05/19/2018 11:35 AM   Modules accepted: Orders

## 2018-05-19 NOTE — Telephone Encounter (Signed)
Pt called stating his Rx's from his office visit yesterday have not been sent to [facility]  Stop Lasix  Start Torsemide 20 mg Two Times Daily  Start Potassium 20 meq Two Times Daily

## 2018-05-23 ENCOUNTER — Telehealth: Payer: Self-pay | Admitting: Cardiovascular Disease

## 2018-05-23 ENCOUNTER — Ambulatory Visit
Admission: RE | Admit: 2018-05-23 | Discharge: 2018-05-23 | Disposition: A | Discharge status: Home / Self Care | Payer: Medicare Other | Source: Ambulatory Visit | Attending: Urology | Admitting: Urology

## 2018-05-23 ENCOUNTER — Other Ambulatory Visit (HOSPITAL_COMMUNITY): Payer: Medicare Other

## 2018-05-23 ENCOUNTER — Other Ambulatory Visit: Payer: Self-pay

## 2018-05-23 ENCOUNTER — Encounter: Payer: Self-pay | Admitting: Urology

## 2018-05-23 VITALS — BP 133/70 | HR 59 | Temp 97.6°F | Resp 20 | Ht 70.0 in | Wt 213.2 lb

## 2018-05-23 DIAGNOSIS — Z888 Allergy status to other drugs, medicaments and biological substances status: Secondary | ICD-10-CM | POA: Insufficient documentation

## 2018-05-23 DIAGNOSIS — C3411 Malignant neoplasm of upper lobe, right bronchus or lung: Secondary | ICD-10-CM

## 2018-05-23 DIAGNOSIS — I509 Heart failure, unspecified: Secondary | ICD-10-CM | POA: Diagnosis not present

## 2018-05-23 DIAGNOSIS — R0602 Shortness of breath: Secondary | ICD-10-CM | POA: Insufficient documentation

## 2018-05-23 DIAGNOSIS — R600 Localized edema: Secondary | ICD-10-CM | POA: Insufficient documentation

## 2018-05-23 DIAGNOSIS — R911 Solitary pulmonary nodule: Secondary | ICD-10-CM | POA: Diagnosis not present

## 2018-05-23 DIAGNOSIS — J449 Chronic obstructive pulmonary disease, unspecified: Secondary | ICD-10-CM | POA: Diagnosis not present

## 2018-05-23 DIAGNOSIS — R2242 Localized swelling, mass and lump, left lower limb: Secondary | ICD-10-CM | POA: Diagnosis not present

## 2018-05-23 DIAGNOSIS — Z79899 Other long term (current) drug therapy: Secondary | ICD-10-CM | POA: Diagnosis not present

## 2018-05-23 DIAGNOSIS — Z08 Encounter for follow-up examination after completed treatment for malignant neoplasm: Secondary | ICD-10-CM | POA: Diagnosis not present

## 2018-05-23 DIAGNOSIS — Z950 Presence of cardiac pacemaker: Secondary | ICD-10-CM | POA: Insufficient documentation

## 2018-05-23 DIAGNOSIS — Z7982 Long term (current) use of aspirin: Secondary | ICD-10-CM | POA: Insufficient documentation

## 2018-05-23 DIAGNOSIS — I428 Other cardiomyopathies: Secondary | ICD-10-CM | POA: Diagnosis not present

## 2018-05-23 DIAGNOSIS — R2241 Localized swelling, mass and lump, right lower limb: Secondary | ICD-10-CM | POA: Diagnosis not present

## 2018-05-23 NOTE — Addendum Note (Signed)
Encounter addended by: Freeman Caldron, PA-C on: 05/23/2018 5:26 PM  Actions taken: Order list changed, Diagnosis association updated, Visit Navigator Flowsheet section accepted

## 2018-05-23 NOTE — Telephone Encounter (Signed)
PA called me back, she is going to message dr Fuller Mandril pt has gained yet another 7 lbs and edema is up to patients thighs

## 2018-05-23 NOTE — Progress Notes (Signed)
Radiation Oncology         [phone] ________________________________  Name: Shaun May MRN: [id number]  Date: 05/23/2018  DOB: September 19, 1951  Post Treatment Note  CC: Celene Squibb, MD  Celene Squibb, MD  Diagnosis:   67 y.o.gentleman with 2.4 cm RUL lung nodule, putative stage I NSCLC.   Interval Since Last Radiation: 4 months  01/13/18, 01/17/18, 01/20/18:  The target was treated to 54 Gy in 3 fractions of 18 Gy  Narrative:  The patient returns today for further evaluation following recent ER visit on 05/17/18.  He presented to the emergency department in the hospital with complaints of progressive dyspnea on exertion and progressive bilateral LE swelling over the past 2 to 3 weeks despite Lasix 40mg  po BID.  He has a history of CHF, severe COPD and s/p pacemaker placement for nonischemic cardiomyopathy.  He denied any recent change in sputum production, pain with inspiration, increased frequency of cough, fever, chills, hemoptysis or chest pain.  BNP was elevated at 262.  CTA chest was performed to rule out pulmonary embolism and this showed evolving postradiation changes in the right upper lobe adjacent to the previously treated right upper lobe nodule but no evidence of disease progression or new lesions.  There was also a small amount of pericardial fluid and/or thickening with some pericardial enhancement. No evidence of pulmonary embolism.  Known underlying COPD/emphysema noted.  He was discharged home on 05/17/2018 with recommendations to follow-up with cardiology.  He saw his cardiologist, Dr. Bronson Ing on 05/18/2018.  His fluid pill was changed from Lasix to torsemide 20 mg p.o. twice daily and his potassium increased to 20 mEq twice daily.  However, since that time, he has had progressive swelling in the bilateral lower extremities and increased lower extremity cramping which he associates with the fluid pills.  The shortness of breath has not significantly worsened but has certainly not  improved.                            On review of systems, the patient states that he continues with shortness of breath on exertion, gradually worsening despite recent change in medications.  He denies coughing up yellow/green sputum, hemoptysis, increased cough, chest pain, night sweats, fever or chills.  He reports that he is not tolerating the new turosemide due to increased intermittent leg cramping despite taking higher dose of potassium.  This morning, he self switched back to Lasix 40mg  po BID.  He denies redness or warmth in the lower extremities. He has noted increased swelling in the abdomen. He is scheduled for an echocardiogram on Friday, 05/26/2018.   ALLERGIES:  is allergic to xarelto [rivaroxaban].  Meds: Current Outpatient Medications  Medication Sig Dispense Refill  . albuterol (PROVENTIL HFA;VENTOLIN HFA) 108 (90 Base) MCG/ACT inhaler Inhale 1-2 puffs into the lungs every 6 (six) hours as needed for wheezing or shortness of breath. 1 Inhaler 0  . aspirin EC 81 MG tablet Take 81 mg by mouth daily.    Marland Kitchen atorvastatin (LIPITOR) 40 MG tablet TAKE ONE TABLET BY MOUTH ONCE DAILY AT 6PM. 30 tablet 6  . carvedilol (COREG) 25 MG tablet Take 25 mg by mouth 2 (two) times daily with a meal.   11  . Dextromethorphan-Guaifenesin (MUCINEX DM MAXIMUM STRENGTH) 60-1200 MG TB12 Take 1 tablet by mouth daily.    Marland Kitchen guaiFENesin-dextromethorphan (ROBITUSSIN DM) 100-10 MG/5ML syrup Take 5 mLs by mouth every 8 (eight)  hours as needed for cough.     Marland Kitchen ibuprofen (ADVIL,MOTRIN) 800 MG tablet Take 1 tablet (800 mg total) by mouth 3 (three) times daily. 21 tablet 0  . LORazepam (ATIVAN) 2 MG tablet Take 2 mg by mouth 2 (two) times daily.     Marland Kitchen oxyCODONE-acetaminophen (PERCOCET/ROXICET) 5-325 MG tablet Take 1 tablet by mouth 2 (two) times daily. *May take one tablet three times daily as needed for pain    . OXYGEN Inhale 2 L into the lungs at bedtime. **2L with sleep and exertion if needed    . pantoprazole  (PROTONIX) 40 MG tablet TAKE 1 TABLET BY MOUTH DAILY BEFORE BREAKFAST. 90 tablet 3  . Phenyleph-Doxylamine-DM-APAP (NYQUIL SEVERE COLD/FLU PO) Take 1 Dose by mouth at bedtime.    . potassium chloride SA (K-DUR,KLOR-CON) 20 MEQ tablet Take 1 tablet (20 mEq total) by mouth 2 (two) times daily. 180 tablet 3  . rOPINIRole (REQUIP) 0.25 MG tablet Take 0.25-0.75 mg by mouth at bedtime.   1  . torsemide (DEMADEX) 20 MG tablet Take 1 tablet (20 mg total) by mouth 2 (two) times daily. 180 tablet 3  . umeclidinium-vilanterol (ANORO ELLIPTA) 62.5-25 MCG/INH AEPB Inhale 1 puff into the lungs daily.    . sodium chloride (OCEAN) 0.65 % SOLN nasal spray Place 1 spray into both nostrils as needed for congestion.     No current facility-administered medications for this encounter.     Physical Findings:  height is 5\' 10"  (1.778 m) and weight is 213 lb 3.2 oz (96.7 kg). His oral temperature is 97.6 F (36.4 C). His blood pressure is 133/70 and his pulse is 59 (abnormal). His respiration is 20 and oxygen saturation is 97%.  Pain Assessment Pain Score: 2  Pain Loc: Leg(Bil legs)/10 In general this is a well appearing Caucasian male in no acute distress.  He's alert and oriented x4 and appropriate throughout the examination. Cardiopulmonary assessment is negative for acute distress and he exhibits normal effort. Pulmonary exam with diffuse, coarse wheezing throughout all lung fields, R>L.  Bilateral LEs with 3+ pitting edema from ankle to distal thigh. Abdomen is soft, non-tender to palpation and normal bowel sounds.  Lab Findings: Lab Results  Component Value Date   WBC 7.7 05/17/2018   HGB 10.9 (L) 05/17/2018   HCT 34.9 (L) 05/17/2018   MCV 99.4 05/17/2018   PLT 188 05/17/2018     Radiographic Findings: Dg Chest 2 View  Result Date: 05/17/2018 CLINICAL DATA:  Shortness of breath and leg swelling. History of lung cancer, COPD, CHF. EXAM: CHEST - 2 VIEW COMPARISON:  Chest radiograph May 08, 2018  FINDINGS: Cardiac silhouette is mildly enlarged. Mediastinal silhouette is not suspicious, calcified aortic arch. Dual lead LEFT cardiac pacemaker in situ. Masslike RIGHT upper lobe airspace opacity with new surgical clip/fiducial markers. Pulmonary vascular congestion appears normal. No pleural effusion. No pneumothorax. Moderate degenerative changes thoracic spine. IMPRESSION: More conspicuous masslike airspace opacity RIGHT upper lobe with fiducial marker/surgical clips. Tumor progression is possible. Stable cardiomegaly. Electronically Signed   By: Elon Alas M.D.   On: 05/17/2018 16:15   Ct Angio Chest Pe W And/or Wo Contrast  Result Date: 05/17/2018 CLINICAL DATA:  67 year old male with history of chronic shortness of breath worsening over the past 2-3 days. EXAM: CT ANGIOGRAPHY CHEST WITH CONTRAST TECHNIQUE: Multidetector CT imaging of the chest was performed using the standard protocol during bolus administration of intravenous contrast. Multiplanar CT image reconstructions and MIPs were obtained to evaluate the  vascular anatomy. CONTRAST:  124mL ISOVUE-370 IOPAMIDOL (ISOVUE-370) INJECTION 76% COMPARISON:  None. FINDINGS: Cardiovascular: Heart size is normal. Small amount of pericardial fluid and/or thickening with subtle pericardial enhancement. No pericardial calcification. There is aortic atherosclerosis, as well as atherosclerosis of the great vessels of the mediastinum and the coronary arteries, including calcified atherosclerotic plaque in the left main, left anterior descending, left circumflex and right coronary arteries. Mediastinum/Nodes: No pathologically enlarged mediastinal or hilar lymph nodes. Esophagus is unremarkable in appearance. No axillary lymphadenopathy. Lungs/Pleura: Fiducial markers are noted in the right upper lobe around the previously noted right upper lobe nodule. On today's examination, the nodule is less well-defined than the prior study, but measures approximately  2.1 x 1.5 cm. Surrounding ground-glass attenuation and septal thickening is most compatible with evolving postradiation changes. No other suspicious appearing pulmonary nodules or masses are noted. No consolidative airspace disease elsewhere in the lungs. Mild diffuse bronchial wall thickening with mild centrilobular and paraseptal emphysema. Trace right pleural effusion lying dependently. Upper Abdomen: Aortic atherosclerosis. Diffuse low attenuation throughout the visualized portions of the hepatic parenchyma, indicative of hepatic steatosis. Musculoskeletal: There are no aggressive appearing lytic or blastic lesions noted in the visualized portions of the skeleton. Review of the MIP images confirms the above findings. IMPRESSION: 1. No evidence of pulmonary embolism. 2. Evolving postradiation changes in the right upper lobe adjacent to the previously noted right upper lobe nodule, most compatible with evolving postradiation pneumonitis. 3. Small amount of pericardial fluid and/or thickening with some pericardial enhancement. Clinical correlation for signs and symptoms of pericarditis is recommended. 4. Aortic atherosclerosis, in addition to left main and 3 vessel coronary artery disease. Please note that although the presence of coronary artery calcium documents the presence of coronary artery disease, the severity of this disease and any potential stenosis cannot be assessed on this non-gated CT examination. Assessment for potential risk factor modification, dietary therapy or pharmacologic therapy may be warranted, if clinically indicated. 5. Mild diffuse bronchial wall thickening with mild centrilobular and paraseptal emphysema; imaging findings suggestive of underlying COPD. 6. Hepatic steatosis. Aortic Atherosclerosis (ICD10-I70.0) and Emphysema (ICD10-J43.9). Electronically Signed   By: Vinnie Langton M.D.   On: 05/17/2018 18:13    Impression/Plan: 1. 67 y.o.gentleman with 2.4 cm RUL lung nodule,  putative stage I NSCLC.  There is no evidence of disease progression on recent CT Chest. There is evidence of treatment changes surrounding the previously treated RUL lesion but his clinical picture is not fitting with radiation pneumonitis given his symptoms and timing of presentation.  Imaging reviewed with Dr. Tammi Klippel who agrees. We will plan to repeat a CT chest for disease surveillance in 6 months time with a follow-up visit thereafter to review results. He knows to call  with any questions or concerns in the interim. 2. Progressive edema in the bilateral LEs.  His biggest complaint at this time is progressive edema in the LEs despite recent changes in his diuretics.  I advised that I am hesitant to start high dose steroids which could contribute to further fluid retention when I am not entirely convinced that this is radiation pneumonitis.  If his SOB persists once his edema is resolved/ better controlled, I would entertain the idea of treating a potential pneumonitis but feel this is unlikley.   3. Shortness of Breath.  Most likely related to underlying CHF with acute on chronic exacerbation.  Will refer back to cardiology for further medication adjustment/evaluation/treatment.  Hopefully, he can be seen in the office  tomorrow with Dr. Raliegh Ip. If his SOB persists once his edema is resolved/ better controlled, I would entertain the idea of treating a potential pneumonitis but feel this is unlikley.      Nicholos Johns, PA-C

## 2018-05-23 NOTE — Telephone Encounter (Signed)
Ashlyn PA w/ Radiation Oncology is wanting to speak with Dr. Court Joy nurse concerning MR. Solum. States his medication changes are not helping and he's having bilateral lower extremity swelling.   Ashlyn can be reached @ [phone]

## 2018-05-23 NOTE — Telephone Encounter (Signed)
Attempt to reach, line rings once, then goes to fast busy signal. I sent provider a staff message

## 2018-05-24 NOTE — Progress Notes (Signed)
I would try torsemide 40 mg bid rather than Lasix for better bioavailability. If his legs continue to swell and weight increased, he may need to be hospitalized for IV diuresis.

## 2018-05-25 ENCOUNTER — Telehealth: Payer: Self-pay

## 2018-05-25 ENCOUNTER — Telehealth: Payer: Self-pay | Admitting: Cardiovascular Disease

## 2018-05-25 MED ORDER — MAGNESIUM 400 MG PO TABS: 400.0000 mg | ORAL_TABLET | Freq: Every day | ORAL | 3 refills | Status: DC

## 2018-05-25 NOTE — Telephone Encounter (Signed)
-----   Message from Herminio Commons, MD sent at 05/25/2018  9:52 AM EDT ----- Yes. Cathey, please have him take supplemental KCl and Mg Oxide with torsemide.  ----- Message ----- From: Freeman Caldron, PA-C Sent: 05/24/2018   5:37 PM To: Herminio Commons, MD  Will you guys be in contact with the patient regarding treatment recommendations.  He did not tolerate the torosemide 20 mg BID due to severe leg cramping so I am not sure that he would be compliant with increased dose but may consider it coming from you all.  :)  He is scheduled for echo on Friday 05/26/18. Thank you! -Ashlyn ----- Message ----- From: Herminio Commons, MD Sent: 05/24/2018   4:52 PM To: Freeman Caldron, PA-C, Drema Dallas, CMA    ----- Message ----- From: Freeman Caldron, PA-C Sent: 05/23/2018   5:20 PM To: Herminio Commons, MD  This gentleman has progressive LE edema with a 7 lb weight gain since 05/18/18 despite recent changes in his diuretics.  Dr. Tammi Klippel and I reviewed his recent CT imaging and do not feel his SOB is clearly secondary to pneumonitis- more likely related to underlying CHF in light of progressive LE swelling.  Do not recommend steroids at this time. He likely needs a follow up appointment with you in the morning to adjust medications/further assessment and potentially move echo up sooner than scheduled (currently scheduled for Fri. 05/26/18).  He self switched back to Lasix since no improvement with turosemide and increased cramping.  I advised to take 80mg  in the morning and 40mg  in the evening until further advised by your office.  He will continue K+ at 20 mEq BID. Ailene Ards

## 2018-05-25 NOTE — Telephone Encounter (Signed)
I already spoke with patient at 11:13 this morning, NA,NM at home-cc

## 2018-05-25 NOTE — Telephone Encounter (Signed)
Pt returned call

## 2018-05-25 NOTE — Telephone Encounter (Signed)
Patient says he will continue with Torsemide and not take Lasix, he is still taking Potassium 20 meq BID and I e-scribed magnesium 400 mg daily to [employer]

## 2018-05-26 ENCOUNTER — Ambulatory Visit (HOSPITAL_BASED_OUTPATIENT_CLINIC_OR_DEPARTMENT_OTHER)
Admission: RE | Admit: 2018-05-26 | Discharge: 2018-05-26 | Disposition: A | Discharge status: Home / Self Care | Payer: Medicare Other | Source: Ambulatory Visit | Attending: Cardiovascular Disease | Admitting: Cardiovascular Disease

## 2018-05-26 ENCOUNTER — Telehealth: Payer: Self-pay

## 2018-05-26 ENCOUNTER — Emergency Department (HOSPITAL_COMMUNITY): Payer: Medicare Other

## 2018-05-26 ENCOUNTER — Encounter (HOSPITAL_COMMUNITY): Payer: Self-pay | Admitting: *Deleted

## 2018-05-26 ENCOUNTER — Other Ambulatory Visit: Payer: Self-pay

## 2018-05-26 ENCOUNTER — Inpatient Hospital Stay (HOSPITAL_COMMUNITY)
Admission: EM | Admit: 2018-05-26 | Discharge: 2018-05-30 | DRG: 292 | Disposition: A | Discharge status: Home / Self Care | Payer: Medicare Other | Attending: Family Medicine | Admitting: Family Medicine

## 2018-05-26 DIAGNOSIS — G2581 Restless legs syndrome: Secondary | ICD-10-CM | POA: Diagnosis present

## 2018-05-26 DIAGNOSIS — I5033 Acute on chronic diastolic (congestive) heart failure: Secondary | ICD-10-CM

## 2018-05-26 DIAGNOSIS — Z79891 Long term (current) use of opiate analgesic: Secondary | ICD-10-CM

## 2018-05-26 DIAGNOSIS — Z950 Presence of cardiac pacemaker: Secondary | ICD-10-CM

## 2018-05-26 DIAGNOSIS — N179 Acute kidney failure, unspecified: Secondary | ICD-10-CM | POA: Diagnosis not present

## 2018-05-26 DIAGNOSIS — R600 Localized edema: Secondary | ICD-10-CM | POA: Diagnosis not present

## 2018-05-26 DIAGNOSIS — I5023 Acute on chronic systolic (congestive) heart failure: Secondary | ICD-10-CM | POA: Diagnosis present

## 2018-05-26 DIAGNOSIS — Z720 Tobacco use: Secondary | ICD-10-CM

## 2018-05-26 DIAGNOSIS — I4430 Unspecified atrioventricular block: Secondary | ICD-10-CM | POA: Insufficient documentation

## 2018-05-26 DIAGNOSIS — C3411 Malignant neoplasm of upper lobe, right bronchus or lung: Secondary | ICD-10-CM | POA: Diagnosis not present

## 2018-05-26 DIAGNOSIS — R0602 Shortness of breath: Secondary | ICD-10-CM | POA: Diagnosis not present

## 2018-05-26 DIAGNOSIS — I110 Hypertensive heart disease with heart failure: Principal | ICD-10-CM | POA: Diagnosis present

## 2018-05-26 DIAGNOSIS — R001 Bradycardia, unspecified: Secondary | ICD-10-CM | POA: Insufficient documentation

## 2018-05-26 DIAGNOSIS — I509 Heart failure, unspecified: Secondary | ICD-10-CM

## 2018-05-26 DIAGNOSIS — I080 Rheumatic disorders of both mitral and aortic valves: Secondary | ICD-10-CM

## 2018-05-26 DIAGNOSIS — I481 Persistent atrial fibrillation: Secondary | ICD-10-CM | POA: Diagnosis present

## 2018-05-26 DIAGNOSIS — Z7982 Long term (current) use of aspirin: Secondary | ICD-10-CM

## 2018-05-26 DIAGNOSIS — J449 Chronic obstructive pulmonary disease, unspecified: Secondary | ICD-10-CM

## 2018-05-26 DIAGNOSIS — M7989 Other specified soft tissue disorders: Secondary | ICD-10-CM | POA: Diagnosis not present

## 2018-05-26 DIAGNOSIS — I313 Pericardial effusion (noninflammatory): Secondary | ICD-10-CM | POA: Insufficient documentation

## 2018-05-26 DIAGNOSIS — F17210 Nicotine dependence, cigarettes, uncomplicated: Secondary | ICD-10-CM | POA: Diagnosis present

## 2018-05-26 DIAGNOSIS — F17290 Nicotine dependence, other tobacco product, uncomplicated: Secondary | ICD-10-CM | POA: Diagnosis present

## 2018-05-26 DIAGNOSIS — K219 Gastro-esophageal reflux disease without esophagitis: Secondary | ICD-10-CM | POA: Diagnosis present

## 2018-05-26 DIAGNOSIS — E785 Hyperlipidemia, unspecified: Secondary | ICD-10-CM | POA: Diagnosis present

## 2018-05-26 DIAGNOSIS — Z888 Allergy status to other drugs, medicaments and biological substances status: Secondary | ICD-10-CM

## 2018-05-26 DIAGNOSIS — J9611 Chronic respiratory failure with hypoxia: Secondary | ICD-10-CM | POA: Diagnosis not present

## 2018-05-26 DIAGNOSIS — J9801 Acute bronchospasm: Secondary | ICD-10-CM | POA: Diagnosis present

## 2018-05-26 DIAGNOSIS — R748 Abnormal levels of other serum enzymes: Secondary | ICD-10-CM

## 2018-05-26 DIAGNOSIS — I4892 Unspecified atrial flutter: Secondary | ICD-10-CM | POA: Insufficient documentation

## 2018-05-26 DIAGNOSIS — I119 Hypertensive heart disease without heart failure: Secondary | ICD-10-CM | POA: Insufficient documentation

## 2018-05-26 DIAGNOSIS — Z791 Long term (current) use of non-steroidal anti-inflammatories (NSAID): Secondary | ICD-10-CM

## 2018-05-26 DIAGNOSIS — Z9981 Dependence on supplemental oxygen: Secondary | ICD-10-CM

## 2018-05-26 DIAGNOSIS — I428 Other cardiomyopathies: Secondary | ICD-10-CM | POA: Diagnosis present

## 2018-05-26 DIAGNOSIS — I441 Atrioventricular block, second degree: Secondary | ICD-10-CM | POA: Diagnosis present

## 2018-05-26 LAB — BASIC METABOLIC PANEL
Anion gap: 10 (ref 5–15)
BUN: 26 mg/dL — AB (ref 6–20)
CO2: 26 mmol/L (ref 22–32)
CREATININE: 1.46 mg/dL — AB (ref 0.61–1.24)
Calcium: 8.2 mg/dL — ABNORMAL LOW (ref 8.9–10.3)
Chloride: 97 mmol/L — ABNORMAL LOW (ref 101–111)
GFR calc Af Amer: 56 mL/min — ABNORMAL LOW (ref >60)
GFR, EST NON AFRICAN AMERICAN: 48 mL/min — AB (ref >60)
GLUCOSE: 88 mg/dL (ref 65–99)
Potassium: 4.7 mmol/L (ref 3.5–5.1)
SODIUM: 133 mmol/L — AB (ref 135–145)

## 2018-05-26 LAB — I-STAT TROPONIN, ED: Troponin i, poc: 0.01 ng/mL (ref 0.00–0.08)

## 2018-05-26 LAB — CBC
HEMATOCRIT: 33.9 % — AB (ref 39.0–52.0)
Hemoglobin: 10.5 g/dL — ABNORMAL LOW (ref 13.0–17.0)
MCH: 30.7 pg (ref 26.0–34.0)
MCHC: 31 g/dL (ref 30.0–36.0)
MCV: 99.1 fL (ref 78.0–100.0)
PLATELETS: 244 10*3/uL (ref 150–400)
RBC: 3.42 MIL/uL — ABNORMAL LOW (ref 4.22–5.81)
RDW: 14.9 % (ref 11.5–15.5)
WBC: 9.8 10*3/uL (ref 4.0–10.5)

## 2018-05-26 LAB — BRAIN NATRIURETIC PEPTIDE: B Natriuretic Peptide: 380 pg/mL — ABNORMAL HIGH (ref 0.0–100.0)

## 2018-05-26 MED ORDER — FUROSEMIDE 10 MG/ML IJ SOLN
80.0000 mg | Freq: Once | INTRAMUSCULAR | Status: AC
  Administered 2018-05-26: 80 mg via INTRAVENOUS
  Filled 2018-05-26: qty 8

## 2018-05-26 MED ORDER — POTASSIUM CHLORIDE CRYS ER 20 MEQ PO TBCR
40.0000 meq | EXTENDED_RELEASE_TABLET | Freq: Once | ORAL | Status: AC
  Administered 2018-05-26: 40 meq via ORAL
  Filled 2018-05-26: qty 2

## 2018-05-26 NOTE — Telephone Encounter (Signed)
Patient not at home, work states he is at an apt, will LM for pt to go to ED

## 2018-05-26 NOTE — ED Provider Notes (Signed)
Emergency Department Provider Note   I have reviewed the triage vital signs and the nursing notes.   HISTORY  Chief Complaint Shortness of Breath   HPI Shaun May is a 67 y.o. male multiple medical problems as documented below the presents the emergency department today secondary to worsening dyspnea.  Patient states worsening dyspnea on exertion, shortness of breath when lying flat and increased lower extremity swelling and abdominal distention.  He saw his cardiologist today which will need to be admitted to the hospital for diuresis but also switching from Lasix to torsemide.  States he had a 12 pound weight gain but unclear over what timeframe.  Nonproductive cough, no fever.  He states he has been urinating but it does not seem to be helping as much as it should. No other associated or modifying symptoms.    Past Medical History:  Diagnosis Date  . Anemia    when on Xarelto  . Cancer ([place])   . CHF (congestive heart failure) ([place])   . COPD (chronic obstructive pulmonary disease) (HCC)    stage 4 COPD  . Dyspnea   . Hemochromatosis   . Hypertension   . Lung nodule   . Mobitz type 2 second degree heart block    PPM MDT 10/28/15 Dr. Lovena Le  . Pericardial effusion   . Persistent atrial fibrillation ([place])   . Presence of permanent cardiac pacemaker    [employer]    Patient Active Problem List   Diagnosis Date Noted  . Chronic respiratory failure with hypoxia (HCC)/ nocturnal hypoxemia  08/15/2017  . Symptomatic anemia 05/25/2016  . Anemia 05/25/2016  . Hypokalemia 05/25/2016  . Hyponatremia 05/25/2016  . Atrial flutter ([place]), paroxysmal   . Pericardial effusion   . Acute on chronic systolic (congestive) heart failure ([place]) 03/29/2016  . 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 Elevated troponin 03/29/2016  . Restless leg syndrome 03/29/2016  . Presumed cancer of right upper lobe of lung ([place]) 03/29/2016  . Chest pain 03/27/2016  . COPD  exacerbation ([place]) 03/27/2016  . Hypertension   . Essential hypertension   . Bradycardia 10/25/2015  . Heart block AV second degree 10/25/2015  . Accelerated hypertension 10/25/2015  . Hemochromatosis 10/25/2015  . Cigarette smoker 10/25/2015  . Alcohol dependence ([place]) 10/25/2015  . Second degree AV block 10/25/2015    Past Surgical History:  Procedure Laterality Date  . BIOPSY  10/13/2016   Procedure: BIOPSY;  Surgeon: Rogene Houston, MD;  [facility];  Service: Endoscopy;;  esophageal  . CARDIAC CATHETERIZATION N/A 03/31/2016   Procedure: Left Heart Cath and Coronary Angiography;  Surgeon: Wellington Hampshire, MD;  Location: [place] [facility];  Service: Cardiovascular;  Laterality: N/A;  . COLONOSCOPY N/A 10/13/2016   Procedure: COLONOSCOPY;  Surgeon: Rogene Houston, MD;  [facility];  Service: Endoscopy;  Laterality: N/A;  . EP IMPLANTABLE DEVICE N/A 10/28/2015   Procedure: Pacemaker Implant;  Surgeon: Evans Lance, MD;  Location: [place] [facility];  Service: Cardiovascular;  Laterality: N/A;  . EP IMPLANTABLE DEVICE N/A 11/07/2015   Procedure: Pocket Revision;  Surgeon: Evans Lance, MD;  Location: [place] [facility];  Service: Cardiovascular;  Laterality: N/A;  . ESOPHAGOGASTRODUODENOSCOPY N/A 05/27/2016   Procedure: ESOPHAGOGASTRODUODENOSCOPY (EGD);  Surgeon: Rogene Houston, MD;  [facility];  Service: Endoscopy;  Laterality: N/A;  . ESOPHAGOGASTRODUODENOSCOPY N/A 10/13/2016   Procedure: ESOPHAGOGASTRODUODENOSCOPY (EGD);  Surgeon: Bernadene Person  Gloriann Loan, MD;  [facility];  Service: Endoscopy;  Laterality: N/A;  1:30 - moved to 11/8 @ 1:00 - Ann notified pt  . FUDUCIAL PLACEMENT Right 12/29/2017   Procedure: PLACEMENT OF FUDUCIAL;  Surgeon: [name], MD;  Location: [place];  Service: Thoracic;  Laterality: Right;  . POLYPECTOMY  10/13/2016   Procedure: POLYPECTOMY;  Surgeon: Rogene Houston, MD;  [facility];  Service:  Endoscopy;;  transverse colon polypectomy  . TONSILLECTOMY    . VIDEO BRONCHOSCOPY WITH ENDOBRONCHIAL NAVIGATION Right 12/29/2017   Procedure: VIDEO BRONCHOSCOPY WITH ENDOBRONCHIAL NAVIGATION;  Surgeon: [name], MD;  Location: [place];  Service: Thoracic;  Laterality: Right;    Current Outpatient Rx  . Order #: [id number] Class: Print  . Order #: [id number] Class: Historical Med  . Order #: [id number] Class: Normal  . Order #: [id number] Class: Historical Med  . Order #: [id number] Class: Historical Med  . Order #: [id number] Class: Historical Med  . Order #: [id number] Class: Print  . Order #: [id number] Class: Historical Med  . Order #: [id number] Class: Normal  . Order #: [id number] Class: Historical Med  . Order #: [id number] Class: Historical Med  . Order #: [id number] Class: Normal  . Order #: [id number] Class: Historical Med  . Order #: [id number] Class: Normal  . Order #: [id number] Class: Historical Med  . Order #: [id number] Class: Historical Med  . Order #: [id number] Class: Normal  . Order #: [id number] Class: Historical Med    Allergies Xarelto [rivaroxaban]  Family History  Problem Relation Age of Onset  . Diabetes Other   . Dementia Mother     Social History Social History   Tobacco Use  . Smoking status: Current Every Day Smoker    Packs/day: 2.00    Years: 56.00    Pack years: 112.00    Types: E-cigarettes, Cigarettes  . Smokeless tobacco: Never Used  Substance Use Topics  . Alcohol use: Yes    Alcohol/week: 1.8 oz    Types: 3 Shots of liquor per week    Comment: nightly  . Drug use: No    Review of Systems  All other systems negative except as documented in the HPI. All pertinent positives and negatives as reviewed in the HPI. ____________________________________________   PHYSICAL EXAM:  VITAL SIGNS: ED Triage Vitals  Enc Vitals Group     BP 05/26/18 2025 (!) 141/75     Pulse Rate 05/26/18 2025 79     Resp 05/26/18 2025 19     Temp 05/26/18 2025 98.5 F  (36.9 C)     Temp Source 05/26/18 2025 Oral     SpO2 05/26/18 2025 94 %     Weight 05/26/18 2029 214 lb (97.1 kg)    Constitutional: Alert and oriented. Well appearing and in no acute distress. Eyes: Conjunctivae are normal. PERRL. EOMI. Head: Atraumatic. Nose: No congestion/rhinnorhea. Mouth/Throat: Mucous membranes are moist.  Oropharynx non-erythematous. Neck: No stridor.  No meningeal signs.   Cardiovascular: Normal rate, regular rhythm. Good peripheral circulation. Grossly normal heart sounds.   Respiratory: tachypneic respiratory effort.  No retractions. Lungs diminished with crackles and wheezing. Gastrointestinal: Soft and nontender. Mild distention.  Musculoskeletal: 2+ edema to knees, 1+ above knes. No gross deformities of extremities. Neurologic:  Normal speech and language. No gross focal neurologic deficits are appreciated.  Skin:  Skin is warm, dry and intact. No rash noted.   ____________________________________________   LABS (all labs ordered are listed, but only abnormal results are displayed)  Labs Reviewed  BASIC METABOLIC PANEL -  Abnormal; Notable for the following components:      Result Value   Sodium 133 (*)    Chloride 97 (*)    BUN 26 (*)    Creatinine, Ser 1.46 (*)    Calcium 8.2 (*)    GFR calc non Af Amer 48 (*)    GFR calc Af Amer 56 (*)    All other components within normal limits  CBC - Abnormal; Notable for the following components:   RBC 3.42 (*)    Hemoglobin 10.5 (*)    HCT 33.9 (*)    All other components within normal limits  BRAIN NATRIURETIC PEPTIDE - Abnormal; Notable for the following components:   B Natriuretic Peptide 380.0 (*)    All other components within normal limits  I-STAT TROPONIN, ED   ____________________________________________  EKG   EKG Interpretation  Date/Time:  Friday May 26 2018 20:33:32 EDT Ventricular Rate:  80 PR Interval:  202 QRS Duration: 176 QT Interval:  426 QTC Calculation: 491 R  Axis:   -169 Text Interpretation:  Atrial-sensed ventricular-paced rhythm Abnormal ECG No significant change since last tracing Confirmed by Merrily Pew [phone]) on 05/26/2018 10:59:25 PM       ____________________________________________  RADIOLOGY  Dg Chest 2 View  Result Date: 05/26/2018 CLINICAL DATA:  Short of breath with recent weight gain EXAM: CHEST - 2 VIEW COMPARISON:  CT 05/17/2018, radiograph 05/17/2018, 12/28/2017 FINDINGS: Mild diffuse bronchitic changes. Surgical clips in the right upper lung as before. Increased interstitial opacity in the right upper lobe, likely corresponding to CT demonstrated post treatment changes. Patchy focus of atelectasis in the left lower lung. No pleural effusion. Mild cardiomegaly with aortic atherosclerosis. Slight central vascular congestion. No pneumothorax. Degenerative changes of the spine. IMPRESSION: 1. Borderline to mild cardiomegaly with slight central vascular congestion 2. Increased interstitial opacity in the right upper lobe, presumably corresponding to CT demonstrated post treatment changes. Electronically Signed   By: Donavan Foil M.D.   On: 05/26/2018 21:24    ____________________________________________   PROCEDURES  Procedure(s) performed:   Procedures   ____________________________________________   INITIAL IMPRESSION / ASSESSMENT AND PLAN / ED COURSE  Suspect fluid overload as cause for symptoms. Could have component of copd as well? Will give breathing tx to ascertain if it helps. Had pe study about a week ago with similar symptoms so no need to repeat at this time. Will get labs/xr/admit.   Pertinent labs & imaging results that were available during my care of the patient were reviewed by me and considered in my medical decision making (see chart for details).  ____________________________________________  FINAL CLINICAL IMPRESSION(S) / ED DIAGNOSES  Final diagnoses:  Acute on chronic diastolic congestive  heart failure (HCC)     MEDICATIONS GIVEN DURING THIS VISIT:  Medications  furosemide (LASIX) injection 80 mg (has no administration in time range)  potassium chloride SA (K-DUR,KLOR-CON) CR tablet 40 mEq (has no administration in time range)     NEW OUTPATIENT MEDICATIONS STARTED DURING THIS VISIT:  New Prescriptions   No medications on file    Note:  This note was prepared with assistance of Dragon voice recognition software. Occasional wrong-word or sound-a-like substitutions may have occurred due to the inherent limitations of voice recognition software.   Merrily Pew, MD 05/26/18 2259

## 2018-05-26 NOTE — Telephone Encounter (Signed)
Pt walked in after echo apt.States he has his suitcases packed to come into the hospital for his leg swelling. He states his weight is at 218 lbs.He is taking Torsemide and potassium. Has stopped the lasix. He wants to know what he should do now

## 2018-05-26 NOTE — Progress Notes (Signed)
*  PRELIMINARY RESULTS* Echocardiogram 2D Echocardiogram has been performed.  Leavy Cella 05/26/2018, 2:56 PM

## 2018-05-26 NOTE — ED Triage Notes (Signed)
Pt was called and advised to come in to the ED.  Notes show that cardiologist noted a 12lbs weight gain and they want him to be hospitalized for diuresis. Pt reports increasing swelling in his legs and increasing sob.  Pt weighs 214lbs in clothes and shoes in triage.

## 2018-05-26 NOTE — Telephone Encounter (Signed)
218 lbs would be consistent with a 12 lb weight gain since his office visit with me. If oral diuretics are failing to resolve symptoms, he should go to ED and be hospitalized for IV diuresis.

## 2018-05-27 ENCOUNTER — Other Ambulatory Visit: Payer: Self-pay

## 2018-05-27 ENCOUNTER — Encounter (HOSPITAL_COMMUNITY): Payer: Self-pay

## 2018-05-27 DIAGNOSIS — I509 Heart failure, unspecified: Secondary | ICD-10-CM

## 2018-05-27 DIAGNOSIS — I5023 Acute on chronic systolic (congestive) heart failure: Secondary | ICD-10-CM | POA: Diagnosis not present

## 2018-05-27 LAB — BASIC METABOLIC PANEL
ANION GAP: 10 (ref 5–15)
BUN: 24 mg/dL — AB (ref 6–20)
CHLORIDE: 94 mmol/L — AB (ref 101–111)
CO2: 31 mmol/L (ref 22–32)
Calcium: 8.3 mg/dL — ABNORMAL LOW (ref 8.9–10.3)
Creatinine, Ser: 1.32 mg/dL — ABNORMAL HIGH (ref 0.61–1.24)
GFR calc Af Amer: >60 mL/min (ref >60)
GFR calc non Af Amer: 55 mL/min — ABNORMAL LOW (ref >60)
Glucose, Bld: 100 mg/dL — ABNORMAL HIGH (ref 65–99)
POTASSIUM: 4.4 mmol/L (ref 3.5–5.1)
SODIUM: 135 mmol/L (ref 135–145)

## 2018-05-27 LAB — CBC
HEMATOCRIT: 32.8 % — AB (ref 39.0–52.0)
HEMOGLOBIN: 10.3 g/dL — AB (ref 13.0–17.0)
MCH: 31 pg (ref 26.0–34.0)
MCHC: 31.4 g/dL (ref 30.0–36.0)
MCV: 98.8 fL (ref 78.0–100.0)
Platelets: 232 10*3/uL (ref 150–400)
RBC: 3.32 MIL/uL — AB (ref 4.22–5.81)
RDW: 14.9 % (ref 11.5–15.5)
WBC: 8.7 10*3/uL (ref 4.0–10.5)

## 2018-05-27 LAB — MAGNESIUM: Magnesium: 1 mg/dL — ABNORMAL LOW (ref 1.7–2.4)

## 2018-05-27 MED ORDER — MUCINEX DM MAXIMUM STRENGTH 60-1200 MG PO TB12: 1.0000 | ORAL_TABLET | Freq: Every day | ORAL | Status: DC

## 2018-05-27 MED ORDER — ACETAMINOPHEN 325 MG PO TABS: 650.0000 mg | ORAL_TABLET | Freq: Four times a day (QID) | ORAL | Status: DC | PRN

## 2018-05-27 MED ORDER — ONDANSETRON HCL 4 MG PO TABS: 4.0000 mg | ORAL_TABLET | Freq: Four times a day (QID) | ORAL | Status: DC | PRN

## 2018-05-27 MED ORDER — FUROSEMIDE 10 MG/ML IJ SOLN
40.0000 mg | Freq: Two times a day (BID) | INTRAMUSCULAR | Status: DC
  Administered 2018-05-27 – 2018-05-30 (×7): 40 mg via INTRAVENOUS
  Filled 2018-05-27 (×7): qty 4

## 2018-05-27 MED ORDER — SODIUM CHLORIDE 0.9% FLUSH
3.0000 mL | Freq: Two times a day (BID) | INTRAVENOUS | Status: DC
  Administered 2018-05-27 – 2018-05-30 (×5): 3 mL via INTRAVENOUS

## 2018-05-27 MED ORDER — BUDESONIDE 0.25 MG/2ML IN SUSP
0.2500 mg | Freq: Two times a day (BID) | RESPIRATORY_TRACT | Status: DC
  Administered 2018-05-27 – 2018-05-30 (×7): 0.25 mg via RESPIRATORY_TRACT
  Filled 2018-05-27 (×14): qty 2

## 2018-05-27 MED ORDER — MAGNESIUM OXIDE 400 (241.3 MG) MG PO TABS
400.0000 mg | ORAL_TABLET | Freq: Every day | ORAL | Status: DC
  Administered 2018-05-27 – 2018-05-30 (×4): 400 mg via ORAL
  Filled 2018-05-27: qty 1
  Filled 2018-05-27: qty 2
  Filled 2018-05-27: qty 1
  Filled 2018-05-27 (×4): qty 2
  Filled 2018-05-27 (×2): qty 1

## 2018-05-27 MED ORDER — CARVEDILOL 12.5 MG PO TABS
25.0000 mg | ORAL_TABLET | Freq: Two times a day (BID) | ORAL | Status: DC
  Administered 2018-05-27 – 2018-05-30 (×7): 25 mg via ORAL
  Filled 2018-05-27 (×7): qty 2

## 2018-05-27 MED ORDER — ASPIRIN EC 81 MG PO TBEC
81.0000 mg | DELAYED_RELEASE_TABLET | Freq: Every day | ORAL | Status: DC
  Administered 2018-05-27 – 2018-05-30 (×4): 81 mg via ORAL
  Filled 2018-05-27 (×4): qty 1

## 2018-05-27 MED ORDER — LORAZEPAM 1 MG PO TABS
2.0000 mg | ORAL_TABLET | Freq: Two times a day (BID) | ORAL | Status: DC
  Administered 2018-05-27 – 2018-05-30 (×8): 2 mg via ORAL
  Filled 2018-05-27 (×8): qty 2

## 2018-05-27 MED ORDER — UMECLIDINIUM-VILANTEROL 62.5-25 MCG/INH IN AEPB
1.0000 | INHALATION_SPRAY | Freq: Every day | RESPIRATORY_TRACT | Status: DC
  Administered 2018-05-27 – 2018-05-30 (×4): 1 via RESPIRATORY_TRACT
  Filled 2018-05-27: qty 14

## 2018-05-27 MED ORDER — ATORVASTATIN CALCIUM 40 MG PO TABS
40.0000 mg | ORAL_TABLET | Freq: Every day | ORAL | Status: DC
  Administered 2018-05-27 – 2018-05-29 (×3): 40 mg via ORAL
  Filled 2018-05-27 (×3): qty 1

## 2018-05-27 MED ORDER — NICOTINE 14 MG/24HR TD PT24
14.0000 mg | MEDICATED_PATCH | Freq: Every day | TRANSDERMAL | Status: DC
  Administered 2018-05-27 – 2018-05-30 (×4): 14 mg via TRANSDERMAL
  Filled 2018-05-27 (×4): qty 1

## 2018-05-27 MED ORDER — PANTOPRAZOLE SODIUM 40 MG PO TBEC
40.0000 mg | DELAYED_RELEASE_TABLET | Freq: Every day | ORAL | Status: DC
  Administered 2018-05-27 – 2018-05-30 (×4): 40 mg via ORAL
  Filled 2018-05-27 (×4): qty 1

## 2018-05-27 MED ORDER — ACETAMINOPHEN 650 MG RE SUPP: 650.0000 mg | Freq: Four times a day (QID) | RECTAL | Status: DC | PRN

## 2018-05-27 MED ORDER — LEVALBUTEROL HCL 0.63 MG/3ML IN NEBU
0.6300 mg | INHALATION_SOLUTION | Freq: Three times a day (TID) | RESPIRATORY_TRACT | Status: AC
  Administered 2018-05-27 (×3): 0.63 mg via RESPIRATORY_TRACT
  Filled 2018-05-27 (×3): qty 3

## 2018-05-27 MED ORDER — SALINE SPRAY 0.65 % NA SOLN: 1.0000 | NASAL | Status: DC | PRN

## 2018-05-27 MED ORDER — ONDANSETRON HCL 4 MG/2ML IJ SOLN: 4.0000 mg | Freq: Four times a day (QID) | INTRAMUSCULAR | Status: DC | PRN

## 2018-05-27 MED ORDER — DM-GUAIFENESIN ER 30-600 MG PO TB12
1.0000 | ORAL_TABLET | Freq: Two times a day (BID) | ORAL | Status: DC
  Administered 2018-05-27 – 2018-05-30 (×7): 1 via ORAL
  Filled 2018-05-27 (×8): qty 1

## 2018-05-27 MED ORDER — ROPINIROLE HCL 0.25 MG PO TABS
0.2500 mg | ORAL_TABLET | Freq: Every day | ORAL | Status: DC
  Administered 2018-05-27 – 2018-05-29 (×4): 0.75 mg via ORAL
  Filled 2018-05-27 (×4): qty 3

## 2018-05-27 MED ORDER — IPRATROPIUM BROMIDE 0.02 % IN SOLN
0.5000 mg | Freq: Three times a day (TID) | RESPIRATORY_TRACT | Status: AC
  Administered 2018-05-27 (×3): 0.5 mg via RESPIRATORY_TRACT
  Filled 2018-05-27 (×3): qty 2.5

## 2018-05-27 MED ORDER — SODIUM CHLORIDE 0.9 % IV SOLN: 250.0000 mL | INTRAVENOUS | Status: DC | PRN

## 2018-05-27 MED ORDER — POTASSIUM CHLORIDE CRYS ER 20 MEQ PO TBCR
20.0000 meq | EXTENDED_RELEASE_TABLET | Freq: Two times a day (BID) | ORAL | Status: DC
  Administered 2018-05-27 – 2018-05-30 (×8): 20 meq via ORAL
  Filled 2018-05-27 (×9): qty 1

## 2018-05-27 MED ORDER — SODIUM CHLORIDE 0.9% FLUSH: 3.0000 mL | INTRAVENOUS | Status: DC | PRN

## 2018-05-27 MED ORDER — OXYCODONE-ACETAMINOPHEN 5-325 MG PO TABS
1.0000 | ORAL_TABLET | Freq: Two times a day (BID) | ORAL | Status: DC
  Administered 2018-05-27 – 2018-05-30 (×8): 1 via ORAL
  Filled 2018-05-27 (×8): qty 1

## 2018-05-27 MED ORDER — GUAIFENESIN-DM 100-10 MG/5ML PO SYRP
5.0000 mL | ORAL_SOLUTION | Freq: Three times a day (TID) | ORAL | Status: DC | PRN
  Administered 2018-05-27 – 2018-05-29 (×3): 5 mL via ORAL
  Filled 2018-05-27 (×3): qty 5

## 2018-05-27 NOTE — H&P (Addendum)
History and Physical    CUTTER PASSEY GLO:[id number] DOB: 1951/02/09 DOA: 05/26/2018  PCP: Celene Squibb, MD   Patient coming from: Home  Chief Complaint: Dyspnea/weight gain/edema  HPI: Shaun May is a 67 y.o. 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  This is worse when laying flat and he has had worsening bilateral lower extremity swelling and abdominal distention as well.  He was recently switched from Lasix 40 mg twice daily to torsemide 40 mg twice daily just today and he apparently took his first dose earlier this morning.  He called his cardiologist Dr. Bronson Ing and stated that he had gained approximately 12 pounds of weight and was told to come to the ED for further evaluation as he had persistent symptoms. He denies any cough, fever, chills, or chest pain.   ED Course: Vital signs are stable.  BNP is 380 and troponin is 0.01.  EKG with no acute findings.  Two-view chest x-ray with mild cardiomegaly and central vascular congestion as well as right upper lobe opacity which has been noted previously and is suspected to be related to radiation pneumonitis.  He has been given 80 mg of IV Lasix and appears to be starting to diurese quite well.  Review of Systems: All others reviewed and otherwise negative.  Past Medical History:  Diagnosis Date  . Anemia    when on Xarelto  . Cancer ([place])   . CHF (congestive heart failure) ([place])   . COPD (chronic obstructive pulmonary disease) (HCC)    stage 4 COPD  . Dyspnea   . Hemochromatosis   . Hypertension   . Lung nodule   . Mobitz type 2  second degree heart block    PPM MDT 10/28/15 Dr. Lovena Le  . Pericardial effusion   . Persistent atrial fibrillation ([place])   . Presence of permanent cardiac pacemaker    [employer]    Past Surgical History:  Procedure Laterality Date  . BIOPSY  10/13/2016   Procedure: BIOPSY;  Surgeon: Rogene Houston, MD;  [facility];  Service: Endoscopy;;  esophageal  . CARDIAC CATHETERIZATION N/A 03/31/2016   Procedure: Left Heart Cath and Coronary Angiography;  Surgeon: Wellington Hampshire, MD;  Location: [place] [facility];  Service: Cardiovascular;  Laterality: N/A;  . COLONOSCOPY N/A 10/13/2016   Procedure: COLONOSCOPY;  Surgeon: Rogene Houston, MD;  [facility];  Service: Endoscopy;  Laterality: N/A;  . EP IMPLANTABLE DEVICE N/A 10/28/2015   Procedure: Pacemaker Implant;  Surgeon: Evans Lance, MD;  Location: [place] [facility];  Service: Cardiovascular;  Laterality: N/A;  . EP IMPLANTABLE DEVICE N/A 11/07/2015   Procedure: Pocket Revision;  Surgeon: Evans Lance, MD;  Location: [place] [facility];  Service: Cardiovascular;  Laterality: N/A;  . ESOPHAGOGASTRODUODENOSCOPY N/A 05/27/2016   Procedure: ESOPHAGOGASTRODUODENOSCOPY (EGD);  Surgeon: Rogene Houston, MD;  [facility];  Service: Endoscopy;  Laterality: N/A;  . ESOPHAGOGASTRODUODENOSCOPY N/A 10/13/2016   Procedure: ESOPHAGOGASTRODUODENOSCOPY (EGD);  Surgeon: Rogene Houston, MD;  [facility];  Service: Endoscopy;  Laterality: N/A;  1:30 - moved to 11/8 @  1:00 - Ann notified pt  . FUDUCIAL PLACEMENT Right 12/29/2017   Procedure: PLACEMENT OF FUDUCIAL;  Surgeon: [name], MD;  Location: [facility];  Service: Thoracic;  Laterality: Right;  . POLYPECTOMY  10/13/2016   Procedure: POLYPECTOMY;  Surgeon: Rogene Houston, MD;  [facility];  Service: Endoscopy;;  transverse colon polypectomy  . TONSILLECTOMY    . VIDEO BRONCHOSCOPY WITH ENDOBRONCHIAL NAVIGATION Right 12/29/2017    Procedure: VIDEO BRONCHOSCOPY WITH ENDOBRONCHIAL NAVIGATION;  Surgeon: [name], MD;  Location: [place];  Service: Thoracic;  Laterality: Right;     reports that he has been smoking e-cigarettes and cigarettes.  He has a 112.00 pack-year smoking history. He has never used smokeless tobacco. He reports that he drinks about 1.8 oz of alcohol per week. He reports that he does not use drugs.  Allergies  Allergen Reactions  . Xarelto [Rivaroxaban] Other (See Comments)    Caused bleeding.     Family History  Problem Relation Age of Onset  . Diabetes Other   . Dementia Mother     Prior to Admission medications   Medication Sig Start Date End Date Taking? Authorizing Provider  albuterol (PROVENTIL HFA;VENTOLIN HFA) 108 (90 Base) MCG/ACT inhaler Inhale 1-2 puffs into the lungs every 6 (six) hours as needed for wheezing or shortness of breath. 08/01/17   Fredia Sorrow, MD  aspirin EC 81 MG tablet Take 81 mg by mouth daily.    [provider]  atorvastatin (LIPITOR) 40 MG tablet TAKE ONE TABLET BY MOUTH ONCE DAILY AT 6PM. 09/22/16   Herminio Commons, MD  carvedilol (COREG) 25 MG tablet Take 25 mg by mouth 2 (two) times daily with a meal.  01/23/16   [provider]  Dextromethorphan-Guaifenesin (MUCINEX DM MAXIMUM STRENGTH) 60-1200 MG TB12 Take 1 tablet by mouth daily.    [provider]  guaiFENesin-dextromethorphan (ROBITUSSIN DM) 100-10 MG/5ML syrup Take 5 mLs by mouth every 8 (eight) hours as needed for cough.     [provider]  ibuprofen (ADVIL,MOTRIN) 800 MG tablet Take 1 tablet (800 mg total) by mouth 3 (three) times daily. 05/17/18   Pattricia Boss, MD  LORazepam (ATIVAN) 2 MG tablet Take 2 mg by mouth 2 (two) times daily.  10/01/15   [provider]  Magnesium 400 MG TABS Take 400 mg by mouth daily. 05/25/18   Herminio Commons, MD  oxyCODONE-acetaminophen (PERCOCET/ROXICET) 5-325 MG tablet Take 1 tablet by mouth 2 (two) times  daily. *May take one tablet three times daily as needed for pain    [provider]  OXYGEN Inhale 2 L into the lungs at bedtime. **2L with sleep and exertion if needed    [provider]  pantoprazole (PROTONIX) 40 MG tablet TAKE 1 TABLET BY MOUTH DAILY BEFORE BREAKFAST. 12/12/17   Rehman, Mechele Dawley, MD  Phenyleph-Doxylamine-DM-APAP (NYQUIL SEVERE COLD/FLU PO) Take 1 Dose by mouth at bedtime.    [provider]  potassium chloride SA (K-DUR,KLOR-CON) 20 MEQ tablet Take 1 tablet (20 mEq total) by mouth 2 (two) times daily. 05/19/18   Herminio Commons, MD  rOPINIRole (REQUIP) 0.25 MG tablet Take 0.25-0.75 mg by mouth at bedtime.  02/25/16   [provider]  sodium chloride (OCEAN) 0.65 % SOLN nasal spray Place 1 spray into both nostrils as needed for congestion.    [provider]  torsemide (DEMADEX) 20 MG tablet Take 1 tablet (20 mg total) by mouth 2 (two) times daily.  05/19/18 08/17/18  Herminio Commons, MD  umeclidinium-vilanterol (ANORO ELLIPTA) 62.5-25 MCG/INH AEPB Inhale 1 puff into the lungs daily.    [provider]    Physical Exam: Vitals:   05/26/18 2025 05/26/18 2029  BP: (!) 141/75   Pulse: 79   Resp: 19   Temp: 98.5 F (36.9 C)   TempSrc: Oral   SpO2: 94%   Weight:  97.1 kg (214 lb)    Constitutional: NAD, calm, comfortable Vitals:   05/26/18 2025 05/26/18 2029  BP: (!) 141/75   Pulse: 79   Resp: 19   Temp: 98.5 F (36.9 C)   TempSrc: Oral   SpO2: 94%   Weight:  97.1 kg (214 lb)   Eyes: lids and conjunctivae normal ENMT: Mucous membranes are moist.  Neck: normal, supple Respiratory: clear to auscultation bilaterally. Normal respiratory effort. No accessory muscle use.  Currently on 2 L nasal cannula with mild wheezing noted. Cardiovascular: Regular rate and rhythm, no murmurs. No extremity edema. Abdomen: no tenderness, no distention. Bowel sounds positive.  Musculoskeletal:  No joint deformity upper and  lower extremities.   Skin: no rashes, lesions, ulcers.  Psychiatric: Normal judgment and insight. Alert and oriented x 3. Normal mood.   Labs on Admission: I have personally reviewed following labs and imaging studies  CBC: Recent Labs  Lab 05/26/18 2041  WBC 9.8  HGB 10.5*  HCT 33.9*  MCV 99.1  PLT 703   Basic Metabolic Panel: Recent Labs  Lab 05/26/18 2041  NA 133*  K 4.7  CL 97*  CO2 26  GLUCOSE 88  BUN 26*  CREATININE 1.46*  CALCIUM 8.2*   GFR: Estimated Creatinine Clearance: 58.1 mL/min (A) (by C-G formula based on SCr of 1.46 mg/dL (H)). Liver Function Tests: No results for input(s): AST, ALT, ALKPHOS, BILITOT, PROT, ALBUMIN in the last 168 hours. No results for input(s): LIPASE, AMYLASE in the last 168 hours. No results for input(s): AMMONIA in the last 168 hours. Coagulation Profile: No results for input(s): INR, PROTIME in the last 168 hours. Cardiac Enzymes: No results for input(s): CKTOTAL, CKMB, CKMBINDEX, TROPONINI in the last 168 hours. BNP (last 3 results) No results for input(s): PROBNP in the last 8760 hours. HbA1C: No results for input(s): HGBA1C in the last 72 hours. CBG: No results for input(s): GLUCAP in the last 168 hours. Lipid Profile: No results for input(s): CHOL, HDL, LDLCALC, TRIG, CHOLHDL, LDLDIRECT in the last 72 hours. Thyroid Function Tests: No results for input(s): TSH, T4TOTAL, FREET4, T3FREE, THYROIDAB in the last 72 hours. Anemia Panel: No results for input(s): VITAMINB12, FOLATE, FERRITIN, TIBC, IRON, RETICCTPCT in the last 72 hours. Urine analysis:    Component Value Date/Time   COLORURINE YELLOW 05/25/2016 2318   APPEARANCEUR CLEAR 05/25/2016 2318   LABSPEC <1.005 (L) 05/25/2016 2318   PHURINE 5.5 05/25/2016 2318   GLUCOSEU NEGATIVE 05/25/2016 2318   HGBUR NEGATIVE 05/25/2016 2318   [place] NEGATIVE 05/25/2016 2318   KETONESUR NEGATIVE 05/25/2016 2318   PROTEINUR NEGATIVE 05/25/2016 2318   NITRITE NEGATIVE  05/25/2016 2318   LEUKOCYTESUR NEGATIVE 05/25/2016 2318    Radiological Exams on Admission: Dg Chest 2 View  Result Date: 05/26/2018 CLINICAL DATA:  Short of breath with recent weight gain EXAM: CHEST - 2 VIEW COMPARISON:  CT 05/17/2018, radiograph 05/17/2018, 12/28/2017 FINDINGS: Mild diffuse bronchitic changes. Surgical clips in the right upper lung as before. Increased interstitial opacity in the right upper lobe, likely corresponding to CT demonstrated post treatment changes. Patchy  focus of atelectasis in the left lower lung. No pleural effusion. Mild cardiomegaly with aortic atherosclerosis. Slight central vascular congestion. No pneumothorax. Degenerative changes of the spine. IMPRESSION: 1. Borderline to mild cardiomegaly with slight central vascular congestion 2. Increased interstitial opacity in the right upper lobe, presumably corresponding to CT demonstrated post treatment changes. Electronically Signed   By: Donavan Foil M.D.   On: 05/26/2018 21:24    EKG: Independently reviewed. 80bpm V-paced rhythm.  Assessment/Plan Principal Problem:   Acute on chronic systolic (congestive) heart failure (HCC) Active Problems:   COPD GOLD IV/ 02 dep at hs/ still smoking    Restless leg syndrome   Presumed cancer of right upper lobe of lung (HCC)   Atrial flutter ([place]), paroxysmal    1. Acute on chronic systolic congestive heart failure.  This is in the setting of nonischemic cardiomyopathy with permanent pacemaker placement.  Plan for ongoing diuresis with Lasix 40 mg twice daily and monitor daily weights as well as I's and O's.  Fluid restriction. 2. Mild AKI.  Monitor carefully with ongoing diuresis with repeat renal panel.  Avoid other nephrotoxic agents. 3. Advanced COPD Gold stage IV.  Patient does have some mild bronchospasms for which I will order duo nebs as well as Pulmicort.  He does not appear to be in full exacerbation however.  He does wear 2 L home oxygen at bedtime and thinks  that he may require oxygen during the daytime as well. 4. Ongoing tobacco use. Nicotine patch ordered. Does not desire to quit. 5. Paroxysmal atrial flutter.  Monitor on telemetry.  Currently rate controlled and patient has pacemaker.  Maintain on carvedilol.  No anticoagulation due to prior GI bleed history. 6. Restless leg syndrome.  Maintained on Requip. 7. GERD.  Maintain on PPI. 8. Dyslipidemia.  Maintain on Lipitor.   DVT prophylaxis: SCDs Code Status: Full Family Communication: Friend at bedside Disposition Plan:Diuresis Consults called:None Admission status: Obs, tele   Emersynn Deatley Darleen Crocker DO [facility] Pager [phone]  If 7PM-7AM, please contact night-coverage [email or web address] Password [facility]  05/27/2018, 1:07 AM

## 2018-05-27 NOTE — Plan of Care (Signed)
Continue planned regimen

## 2018-05-27 NOTE — Progress Notes (Signed)
PROGRESS NOTE  KYIAN OBST JJH:[id number] DOB: [birth year] DOA: 05/26/2018 PCP: Celene Squibb, MD  HPI/Recap of past 24 hours: HPI: Shaun May is a 67 y.o. 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  This is worse when laying flat and he has had worsening bilateral lower extremity swelling and abdominal distention as well.  He was recently switched from Lasix 40 mg twice daily to torsemide 40 mg twice daily just today and he apparently took his first dose earlier this morning.  He called his cardiologist Dr. Bronson Ing and stated that he had gained approximately 12 pounds of weight and was told to come to the ED for further evaluation as he had persistent symptoms. He denies any cough, fever, chills, or chest pain.  Patient stated his leg pain and stiffness is better    Assessment/Plan: Principal Problem:   Acute on chronic systolic (congestive) heart failure (HCC) Active Problems:   COPD GOLD IV/ 02 dep at hs/ still smoking    Restless leg syndrome   Presumed cancer of right upper lobe of lung (HCC)   Atrial flutter ([place]), paroxysmal   CHF (congestive heart failure) ([place])   Code Status: Full  Family Communication: Wife was at bedside  Disposition Plan: Home   Consultants:  None  Procedures:  None  Antimicrobials:  None  DVT prophylaxis: SCD   Objective: Vitals:   05/27/18 0739 05/27/18 1003 05/27/18 1337 05/27/18 1411  BP:   100/61   Pulse:   60   Resp:   18   Temp:   97.8 F (36.6 C)   TempSrc:   Oral   SpO2: 99%  99% 93%  Weight:  95.3 kg (210 lb 1.6 oz)       Intake/Output Summary (Last 24 hours) at 05/27/2018 1755 Last data filed at 05/27/2018 1200 Gross per 24 hour  Intake 480 ml  Output -  Net 480 ml   Filed Weights   05/26/18 2029 05/27/18 1003  Weight: 97.1 kg (214 lb) 95.3 kg (210 lb 1.6 oz)   Body mass index is 30.15 kg/m.  Exam:   Eyes: lids and conjunctivae normal ENMT: Mucous membranes are moist.  Neck: normal, supple Respiratory: clear to auscultation bilaterally. Normal respiratory effort. No accessory muscle use.  Currently on 2 L nasal cannula with mild wheezing noted. Cardiovascular: Regular rate and rhythm, no murmurs.mild extremity edema. Abdomen: no tenderness, no distention. Bowel sounds positive.  Musculoskeletal:  No joint deformity upper and lower extremities.   Skin: no rashes, lesions, ulcers.  Psychiatric: Normal judgment and insight. Alert and oriented x 3. Normal mood.     Data Reviewed: CBC: Recent Labs  Lab 05/26/18 2041 05/27/18 0528  WBC 9.8 8.7  HGB 10.5* 10.3*  HCT 33.9* 32.8*  MCV 99.1 98.8  PLT 244 818   Basic Metabolic Panel: Recent Labs  Lab 05/26/18 2041 05/27/18 0527 05/27/18 0528  NA 133*  --  135  K 4.7  --  4.4  CL 97*  --  94*  CO2 26  --  31  GLUCOSE 88  --  100*  BUN 26*  --  24*  CREATININE 1.46*  --  1.32*  CALCIUM 8.2*  --  8.3*  MG  --  1.0*  --    GFR: Estimated Creatinine Clearance: 63.8 mL/min (A) (by C-G formula based on SCr of 1.32 mg/dL (H)). Liver Function Tests: No results for input(s): AST, ALT, ALKPHOS, BILITOT, PROT, ALBUMIN in the last 168 hours. No results for input(s): LIPASE, AMYLASE in the last 168 hours. No results for input(s): AMMONIA in the last 168 hours. Coagulation Profile: No results for input(s): INR, PROTIME in the last 168 hours. Cardiac Enzymes: No results for input(s): CKTOTAL, CKMB, CKMBINDEX, TROPONINI in the last 168 hours. BNP (last 3 results) No results for input(s): PROBNP in the last 8760 hours. HbA1C: No results  for input(s): HGBA1C in the last 72 hours. CBG: No results for input(s): GLUCAP in the last 168 hours. Lipid Profile: No results for input(s): CHOL, HDL, LDLCALC, TRIG, CHOLHDL, LDLDIRECT in the last 72 hours. Thyroid Function Tests: No results for input(s): TSH, T4TOTAL, FREET4, T3FREE, THYROIDAB in the last 72 hours. Anemia Panel: No results for input(s): VITAMINB12, FOLATE, FERRITIN, TIBC, IRON, RETICCTPCT in the last 72 hours. Urine analysis:    Component Value Date/Time   COLORURINE YELLOW 05/25/2016 2318   APPEARANCEUR CLEAR 05/25/2016 2318   LABSPEC <1.005 (L) 05/25/2016 2318   PHURINE 5.5 05/25/2016 2318   GLUCOSEU NEGATIVE 05/25/2016 2318   HGBUR NEGATIVE 05/25/2016 2318   BILIRUBINUR NEGATIVE 05/25/2016 2318   KETONESUR NEGATIVE 05/25/2016 2318   PROTEINUR NEGATIVE 05/25/2016 2318   NITRITE NEGATIVE 05/25/2016 2318   LEUKOCYTESUR NEGATIVE 05/25/2016 2318   Sepsis Labs: @LABRCNTIP (procalcitonin:4,lacticidven:4)  )No results found for this or any previous visit (from the past 240 hour(s)).    Studies: Dg Chest 2 View  Result Date: 05/26/2018 CLINICAL DATA:  Short of breath with recent weight gain EXAM: CHEST - 2 VIEW COMPARISON:  CT 05/17/2018, radiograph 05/17/2018, 12/28/2017 FINDINGS: Mild diffuse bronchitic changes. Surgical clips in the right upper lung as before. Increased interstitial opacity in the right upper lobe, likely corresponding to CT demonstrated post treatment changes. Patchy focus of atelectasis in the left lower lung. No pleural effusion. Mild cardiomegaly with aortic atherosclerosis. Slight central vascular congestion. No pneumothorax. Degenerative changes of the spine. IMPRESSION: 1. Borderline to mild cardiomegaly with slight central vascular congestion 2. Increased interstitial opacity in the right upper lobe, presumably corresponding to CT demonstrated post treatment changes. Electronically Signed   By: Donavan Foil M.D.   On: 05/26/2018 21:24     Scheduled Meds: . aspirin EC  81 mg Oral Daily  . atorvastatin  40 mg Oral q1800  . budesonide (PULMICORT) nebulizer solution  0.25 mg Nebulization BID  . carvedilol  25 mg Oral BID WC  . dextromethorphan-guaiFENesin  1 tablet Oral BID  . furosemide  40 mg Intravenous Q12H  . LORazepam  2 mg Oral BID  . Magnesium  400 mg Oral Daily  . nicotine  14 mg Transdermal Daily  . oxyCODONE-acetaminophen  1 tablet Oral BID  . pantoprazole  40 mg Oral Daily  . potassium chloride SA  20 mEq Oral BID  . rOPINIRole  0.25-0.75 mg Oral QHS  . sodium chloride flush  3 mL Intravenous Q12H  . umeclidinium-vilanterol  1 puff Inhalation Daily    Continuous Infusions: . sodium chloride       LOS: 0 days     Cristal Deer, MD [facility]  To reach me or the doctor on call, go to: [email or web address] Password  [email or web address]  05/27/2018, 5:55 PM

## 2018-05-28 DIAGNOSIS — I5033 Acute on chronic diastolic (congestive) heart failure: Secondary | ICD-10-CM | POA: Diagnosis not present

## 2018-05-28 DIAGNOSIS — I483 Typical atrial flutter: Secondary | ICD-10-CM | POA: Diagnosis not present

## 2018-05-28 DIAGNOSIS — J449 Chronic obstructive pulmonary disease, unspecified: Secondary | ICD-10-CM | POA: Diagnosis not present

## 2018-05-28 DIAGNOSIS — G2581 Restless legs syndrome: Secondary | ICD-10-CM | POA: Diagnosis not present

## 2018-05-28 LAB — BASIC METABOLIC PANEL
ANION GAP: 10 (ref 5–15)
BUN: 21 mg/dL — ABNORMAL HIGH (ref 6–20)
CO2: 29 mmol/L (ref 22–32)
CREATININE: 1.1 mg/dL (ref 0.61–1.24)
Calcium: 8 mg/dL — ABNORMAL LOW (ref 8.9–10.3)
Chloride: 95 mmol/L — ABNORMAL LOW (ref 101–111)
Glucose, Bld: 97 mg/dL (ref 65–99)
Potassium: 4.2 mmol/L (ref 3.5–5.1)
SODIUM: 134 mmol/L — AB (ref 135–145)

## 2018-05-28 LAB — CBC WITH DIFFERENTIAL/PLATELET
BASOS ABS: 0 10*3/uL (ref 0.0–0.1)
BASOS PCT: 0 %
EOS ABS: 0.1 10*3/uL (ref 0.0–0.7)
EOS PCT: 1 %
HCT: 30.7 % — ABNORMAL LOW (ref 39.0–52.0)
Hemoglobin: 9.5 g/dL — ABNORMAL LOW (ref 13.0–17.0)
Lymphocytes Relative: 11 %
Lymphs Abs: 0.8 10*3/uL (ref 0.7–4.0)
MCH: 30.6 pg (ref 26.0–34.0)
MCHC: 30.9 g/dL (ref 30.0–36.0)
MCV: 99 fL (ref 78.0–100.0)
MONO ABS: 0.6 10*3/uL (ref 0.1–1.0)
Monocytes Relative: 8 %
NEUTROS ABS: 5.6 10*3/uL (ref 1.7–7.7)
Neutrophils Relative %: 80 %
PLATELETS: 209 10*3/uL (ref 150–400)
RBC: 3.1 MIL/uL — ABNORMAL LOW (ref 4.22–5.81)
RDW: 15 % (ref 11.5–15.5)
WBC: 7 10*3/uL (ref 4.0–10.5)

## 2018-05-28 LAB — HIV ANTIBODY (ROUTINE TESTING W REFLEX): HIV SCREEN 4TH GENERATION: NONREACTIVE

## 2018-05-28 MED ORDER — MAGNESIUM SULFATE 4 GM/100ML IV SOLN
4.0000 g | Freq: Once | INTRAVENOUS | Status: AC
  Administered 2018-05-28: 4 g via INTRAVENOUS
  Filled 2018-05-28: qty 100

## 2018-05-28 MED ORDER — ENOXAPARIN SODIUM 40 MG/0.4ML ~~LOC~~ SOLN
40.0000 mg | SUBCUTANEOUS | Status: DC
  Administered 2018-05-28 – 2018-05-29 (×2): 40 mg via SUBCUTANEOUS
  Filled 2018-05-28 (×2): qty 0.4

## 2018-05-28 MED ORDER — ALBUTEROL SULFATE (2.5 MG/3ML) 0.083% IN NEBU
2.5000 mg | INHALATION_SOLUTION | Freq: Four times a day (QID) | RESPIRATORY_TRACT | Status: DC
  Administered 2018-05-28 – 2018-05-30 (×5): 2.5 mg via RESPIRATORY_TRACT
  Filled 2018-05-28 (×5): qty 3

## 2018-05-28 MED ORDER — HYDROCORTISONE 1 % EX CREA
TOPICAL_CREAM | Freq: Two times a day (BID) | CUTANEOUS | Status: DC
  Administered 2018-05-28 – 2018-05-30 (×4): via TOPICAL
  Filled 2018-05-28: qty 28

## 2018-05-28 NOTE — Progress Notes (Signed)
PROGRESS NOTE    Shaun May  JKD:[id number] DOB: 1951/04/10 DOA: 05/26/2018 PCP: Celene Squibb, MD    Brief Narrative:  67 year old male admitted to the hospital with decompensated CHF, he had dyspnea weight gain and lower extremity edema.  Started on intravenous Lasix.  He also has COPD and continues to smoke.  He needs continued diuresis at this time.   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 Acute on chronic diastolic congestive heart failure.  Echocardiogram shows ejection fraction of 60 to 65% with grade 2 diastolic dysfunction.  Currently on intravenous Lasix.  Intake and output is not being accurately recorded.  Reinforced importance of collecting all urine to measure accurate output.  Also discussed importance of dietary restrictions.  Will request nutrition consult for further diet education. 2. Chronic respiratory failure with hypoxia.  Related to COPD.  He is chronically on 2 L of oxygen at bedtime. 3. COPD.  He does have some wheezing at this time.  Will start on bronchodilators.  Continue inhaled steroids. 4. Mild AKI.  Improved with diuresis.  Continue to monitor. 5. Ongoing tobacco use.  Nicotine patch ordered. 6. Paroxysmal atrial flutter.  Currently rate controlled.  Continue carvedilol.  Not on anticoagulation due to history of GI bleeding. 7. Restless leg syndrome.  Continue on Requip 8. GERD.  Continue on PPI 9. This hyperlipidemia.  Continue Lipitor.   DVT prophylaxis: Lovenox Code Status: Full code Family Communication: Discussed with wife at the bedside Disposition Plan: Discharge home once improved   Consultants:     Procedures:     Antimicrobials:        Subjective: Feels short of breath.  Continues to have swelling in lower extremities.  He has not been accurately collecting urine output and urinal.  On my arrival, patient is noted to be eating a [employer] biscuit  Objective: Vitals:   05/28/18 0549 05/28/18 0600 05/28/18 1011 05/28/18 1330  BP: 120/69   106/68  Pulse: 60   63  Resp: 16   20  Temp: 98.2 F (36.8 C)   98.2 F (36.8 C)  TempSrc: Oral   Oral  SpO2: 96%  97% 92%  Weight:  94.8 kg (208 lb 15.9 oz)      Intake/Output Summary (Last 24 hours) at 05/28/2018 1757 Last data filed at 05/28/2018 1200 Gross per 24 hour  Intake 720 ml  Output 700 ml  Net 20 ml   Filed Weights   05/26/18 2029 05/27/18 1003 05/28/18 0600  Weight: 97.1 kg (214 lb) 95.3 kg (210 lb 1.6 oz) 94.8 kg (208 lb 15.9 oz)    Examination:  General exam: Appears calm and comfortable  Respiratory system: Crackles at bases with mild wheeze bilaterally. Respiratory effort normal. Cardiovascular system: S1 & S2 heard, RRR. No JVD, murmurs, rubs, gallops or clicks. 2+ pedal edema. Gastrointestinal system: Abdomen is nondistended, soft and nontender. No organomegaly or masses felt. Normal bowel sounds heard. Central nervous system: Alert and oriented. No focal neurological deficits. Extremities: Symmetric 5 x 5 power. Skin: Stasis dermatitis on lower extremities bilaterally Psychiatry: Judgement and insight appear normal. Mood & affect appropriate.     Data Reviewed: I  have personally reviewed following labs and imaging studies  CBC: Recent Labs  Lab 05/26/18 2041 05/27/18 0528 05/28/18 0634  WBC 9.8 8.7 7.0  NEUTROABS  --   --  5.6  HGB 10.5* 10.3* 9.5*  HCT 33.9* 32.8* 30.7*  MCV 99.1 98.8 99.0  PLT 244 232 322   Basic Metabolic Panel: Recent Labs  Lab 05/26/18 2041 05/27/18 0527 05/27/18 0528 05/28/18 0634  NA 133*  --  135 134*  K 4.7  --  4.4 4.2  CL 97*  --  94* 95*  CO2 26  --  31 29  GLUCOSE 88  --  100* 97  BUN 26*  --   24* 21*  CREATININE 1.46*  --  1.32* 1.10  CALCIUM 8.2*  --  8.3* 8.0*  MG  --  1.0*  --   --    GFR: Estimated Creatinine Clearance: 76.3 mL/min (by C-G formula based on SCr of 1.1 mg/dL). Liver Function Tests: No results for input(s): AST, ALT, ALKPHOS, BILITOT, PROT, ALBUMIN in the last 168 hours. No results for input(s): LIPASE, AMYLASE in the last 168 hours. No results for input(s): AMMONIA in the last 168 hours. Coagulation Profile: No results for input(s): INR, PROTIME in the last 168 hours. Cardiac Enzymes: No results for input(s): CKTOTAL, CKMB, CKMBINDEX, TROPONINI in the last 168 hours. BNP (last 3 results) No results for input(s): PROBNP in the last 8760 hours. HbA1C: No results for input(s): HGBA1C in the last 72 hours. CBG: No results for input(s): GLUCAP in the last 168 hours. Lipid Profile: No results for input(s): CHOL, HDL, LDLCALC, TRIG, CHOLHDL, LDLDIRECT in the last 72 hours. Thyroid Function Tests: No results for input(s): TSH, T4TOTAL, FREET4, T3FREE, THYROIDAB in the last 72 hours. Anemia Panel: No results for input(s): VITAMINB12, FOLATE, FERRITIN, TIBC, IRON, RETICCTPCT in the last 72 hours. Sepsis Labs: No results for input(s): PROCALCITON, LATICACIDVEN in the last 168 hours.  No results found for this or any previous visit (from the past 240 hour(s)).       Radiology Studies: Dg Chest 2 View  Result Date: 05/26/2018 CLINICAL DATA:  Short of breath with recent weight gain EXAM: CHEST - 2 VIEW COMPARISON:  CT 05/17/2018, radiograph 05/17/2018, 12/28/2017 FINDINGS: Mild diffuse bronchitic changes. Surgical clips in the right upper lung as before. Increased interstitial opacity in the right upper lobe, likely corresponding to CT demonstrated post treatment changes. Patchy focus of atelectasis in the left lower lung. No pleural effusion. Mild cardiomegaly with aortic atherosclerosis. Slight central vascular congestion. No pneumothorax. Degenerative  changes of the spine. IMPRESSION: 1. Borderline to mild cardiomegaly with slight central vascular congestion 2. Increased interstitial opacity in the right upper lobe, presumably corresponding to CT demonstrated post treatment changes. Electronically Signed   By: Donavan Foil M.D.   On: 05/26/2018 21:24        Scheduled Meds: . albuterol  2.5 mg Nebulization Q6H  . aspirin EC  81 mg Oral Daily  . atorvastatin  40 mg Oral q1800  . budesonide (PULMICORT) nebulizer solution  0.25 mg Nebulization BID  . carvedilol  25 mg Oral BID WC  . dextromethorphan-guaiFENesin  1 tablet Oral BID  . furosemide  40 mg Intravenous Q12H  . hydrocortisone cream   Topical BID  . LORazepam  2 mg Oral BID  . Magnesium  400 mg Oral Daily  . nicotine  14 mg Transdermal Daily  . oxyCODONE-acetaminophen  1 tablet Oral BID  . pantoprazole  40  mg Oral Daily  . potassium chloride SA  20 mEq Oral BID  . rOPINIRole  0.25-0.75 mg Oral QHS  . sodium chloride flush  3 mL Intravenous Q12H  . umeclidinium-vilanterol  1 puff Inhalation Daily   Continuous Infusions: . sodium chloride       LOS: 0 days    Time spent: 35 minutes greater than 50% of this time spent in direct contact with patient discussing management of CHF including dietary restrictions, compliance of medications, management of COPD including stopping smoking    Kathie Dike, MD [facility] Pager [phone]  If 7PM-7AM, please contact night-coverage [email or web address] Password [facility] 05/28/2018, 5:57 PM

## 2018-05-29 DIAGNOSIS — F17210 Nicotine dependence, cigarettes, uncomplicated: Secondary | ICD-10-CM | POA: Diagnosis present

## 2018-05-29 DIAGNOSIS — I509 Heart failure, unspecified: Secondary | ICD-10-CM

## 2018-05-29 DIAGNOSIS — I110 Hypertensive heart disease with heart failure: Secondary | ICD-10-CM | POA: Diagnosis present

## 2018-05-29 DIAGNOSIS — N179 Acute kidney failure, unspecified: Secondary | ICD-10-CM | POA: Diagnosis present

## 2018-05-29 DIAGNOSIS — K219 Gastro-esophageal reflux disease without esophagitis: Secondary | ICD-10-CM | POA: Diagnosis present

## 2018-05-29 DIAGNOSIS — I4892 Unspecified atrial flutter: Secondary | ICD-10-CM | POA: Diagnosis present

## 2018-05-29 DIAGNOSIS — I428 Other cardiomyopathies: Secondary | ICD-10-CM | POA: Diagnosis present

## 2018-05-29 DIAGNOSIS — I5033 Acute on chronic diastolic (congestive) heart failure: Secondary | ICD-10-CM | POA: Diagnosis not present

## 2018-05-29 DIAGNOSIS — Z888 Allergy status to other drugs, medicaments and biological substances status: Secondary | ICD-10-CM | POA: Diagnosis not present

## 2018-05-29 DIAGNOSIS — I481 Persistent atrial fibrillation: Secondary | ICD-10-CM | POA: Diagnosis present

## 2018-05-29 DIAGNOSIS — C3411 Malignant neoplasm of upper lobe, right bronchus or lung: Secondary | ICD-10-CM | POA: Diagnosis not present

## 2018-05-29 DIAGNOSIS — J449 Chronic obstructive pulmonary disease, unspecified: Secondary | ICD-10-CM | POA: Diagnosis not present

## 2018-05-29 DIAGNOSIS — I5023 Acute on chronic systolic (congestive) heart failure: Secondary | ICD-10-CM | POA: Diagnosis not present

## 2018-05-29 DIAGNOSIS — Z9981 Dependence on supplemental oxygen: Secondary | ICD-10-CM | POA: Diagnosis not present

## 2018-05-29 DIAGNOSIS — E785 Hyperlipidemia, unspecified: Secondary | ICD-10-CM | POA: Diagnosis present

## 2018-05-29 DIAGNOSIS — Z950 Presence of cardiac pacemaker: Secondary | ICD-10-CM | POA: Diagnosis not present

## 2018-05-29 DIAGNOSIS — J9801 Acute bronchospasm: Secondary | ICD-10-CM | POA: Diagnosis present

## 2018-05-29 DIAGNOSIS — Z79891 Long term (current) use of opiate analgesic: Secondary | ICD-10-CM | POA: Diagnosis not present

## 2018-05-29 DIAGNOSIS — G2581 Restless legs syndrome: Secondary | ICD-10-CM | POA: Diagnosis not present

## 2018-05-29 DIAGNOSIS — J9611 Chronic respiratory failure with hypoxia: Secondary | ICD-10-CM | POA: Diagnosis present

## 2018-05-29 DIAGNOSIS — Z791 Long term (current) use of non-steroidal anti-inflammatories (NSAID): Secondary | ICD-10-CM | POA: Diagnosis not present

## 2018-05-29 DIAGNOSIS — F17290 Nicotine dependence, other tobacco product, uncomplicated: Secondary | ICD-10-CM | POA: Diagnosis present

## 2018-05-29 DIAGNOSIS — I441 Atrioventricular block, second degree: Secondary | ICD-10-CM | POA: Diagnosis present

## 2018-05-29 DIAGNOSIS — Z7982 Long term (current) use of aspirin: Secondary | ICD-10-CM | POA: Diagnosis not present

## 2018-05-29 DIAGNOSIS — I483 Typical atrial flutter: Secondary | ICD-10-CM | POA: Diagnosis not present

## 2018-05-29 LAB — BASIC METABOLIC PANEL
Anion gap: 10 (ref 5–15)
BUN: 20 mg/dL (ref 6–20)
CHLORIDE: 94 mmol/L — AB (ref 101–111)
CO2: 31 mmol/L (ref 22–32)
CREATININE: 1.16 mg/dL (ref 0.61–1.24)
Calcium: 8 mg/dL — ABNORMAL LOW (ref 8.9–10.3)
Glucose, Bld: 104 mg/dL — ABNORMAL HIGH (ref 65–99)
Potassium: 4.1 mmol/L (ref 3.5–5.1)
SODIUM: 135 mmol/L (ref 135–145)

## 2018-05-29 LAB — MAGNESIUM: MAGNESIUM: 1.9 mg/dL (ref 1.7–2.4)

## 2018-05-29 NOTE — Progress Notes (Signed)
PROGRESS NOTE    Shaun May  ZMO:[id number] DOB: February 04, 1951 DOA: 05/26/2018 PCP: Celene Squibb, MD    Brief Narrative:  67 year old male admitted to the hospital with decompensated CHF, he had dyspnea weight gain and lower extremity edema.  Started on intravenous Lasix.  He also has COPD and continues to smoke.  He needs continued diuresis at this time.   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 Acute on chronic diastolic congestive heart failure.  Echocardiogram shows ejection fraction of 60 to 65% with grade 2 diastolic dysfunction.  Currently on intravenous Lasix.  Urine output fair yesterday with 1800 cc out.  Weight is down approximately 12 pounds since admission.  Weight on admission noted to be 214 pounds.  Continues to have evidence of volume overload and needs further IV diuresis. 2. Chronic respiratory failure with hypoxia.  Related to COPD.  He is chronically on 2 L of oxygen at bedtime. 3. COPD.  Wheezing is better.  Continue on bronchodilators and inhaled steroids. 4. Mild AKI.  Improved with diuresis.  Continue to monitor. 5. Ongoing tobacco use.  Nicotine patch ordered. 6. Paroxysmal atrial flutter.  Currently rate controlled.  Continue carvedilol.  Not on anticoagulation due to history of GI bleeding. 7. Restless leg syndrome.  Continue on Requip 8. GERD.  Continue on PPI 9. Hyperlipidemia.  Continue Lipitor.   DVT prophylaxis: Lovenox Code Status: Full code Family Communication: No family present Disposition Plan: Discharge home once improved   Consultants:     Procedures:  Echo:- Left ventricle: The cavity size was normal. There was moderate   concentric  hypertrophy. Systolic function was normal. The   estimated ejection fraction was in the range of 60% to 65%. Wall   motion was normal; there were no regional wall motion   abnormalities. Features are consistent with a pseudonormal left   ventricular filling pattern, with concomitant abnormal relaxation   and increased filling pressure (grade 2 diastolic dysfunction).   Doppler parameters are consistent with high ventricular filling   pressure. - Aortic valve: Mildly calcified annulus. Trileaflet. - Mitral valve: Mildly calcified annulus. Normal thickness leaflets   . - Right ventricle: Systolic function was mildly reduced. - Pericardium, extracardiac: There was a small to moderate size    pericardial effusion without evidence for tamponade physiology.  Antimicrobials:       Subjective: No chest pain.  Still has some shortness of breath, but feels that he is improving.  Objective: Vitals:   05/29/18 0806 05/29/18 0811 05/29/18 1439 05/29/18 1448  BP:   109/72   Pulse:   63   Resp:   18   Temp:   (!) 97.4 F (36.3 C)   TempSrc:      SpO2: (!) 89% 99% 96% 95%  Weight:        Intake/Output Summary (Last 24 hours) at 05/29/2018 1508 Last data filed at 05/29/2018 1235 Gross per 24 hour  Intake 600 ml  Output 1100 ml  Net -500 ml   Filed Weights   05/27/18 1003 05/28/18 0600 05/29/18 0500  Weight: 95.3 kg (210 lb 1.6 oz) 94.8 kg (208 lb  15.9 oz) 91.6 kg (202 lb)    Examination:  General exam: Alert, awake, oriented x 3 Respiratory system: Crackles at bases. Respiratory effort normal. Cardiovascular system:RRR. No murmurs, rubs, gallops. Gastrointestinal system: Abdomen is nondistended, soft and nontender. No organomegaly or masses felt. Normal bowel sounds heard. Central nervous system: Alert and oriented. No focal neurological deficits. Extremities: 1-2+ pedal edema bilaterally Skin: Rash noted on lower extremities bilaterally Psychiatry: Judgement and insight appear  normal. Mood & affect appropriate.      Data Reviewed: I have personally reviewed following labs and imaging studies  CBC: Recent Labs  Lab 05/26/18 2041 05/27/18 0528 05/28/18 0634  WBC 9.8 8.7 7.0  NEUTROABS  --   --  5.6  HGB 10.5* 10.3* 9.5*  HCT 33.9* 32.8* 30.7*  MCV 99.1 98.8 99.0  PLT 244 232 694   Basic Metabolic Panel: Recent Labs  Lab 05/26/18 2041 05/27/18 0527 05/27/18 0528 05/28/18 0634 05/29/18 0406  NA 133*  --  135 134* 135  K 4.7  --  4.4 4.2 4.1  CL 97*  --  94* 95* 94*  CO2 26  --  31 29 31   GLUCOSE 88  --  100* 97 104*  BUN 26*  --  24* 21* 20  CREATININE 1.46*  --  1.32* 1.10 1.16  CALCIUM 8.2*  --  8.3* 8.0* 8.0*  MG  --  1.0*  --   --  1.9   GFR: Estimated Creatinine Clearance: 71.2 mL/min (by C-G formula based on SCr of 1.16 mg/dL). Liver Function Tests: No results for input(s): AST, ALT, ALKPHOS, BILITOT, PROT, ALBUMIN in the last 168 hours. No results for input(s): LIPASE, AMYLASE in the last 168 hours. No results for input(s): AMMONIA in the last 168 hours. Coagulation Profile: No results for input(s): INR, PROTIME in the last 168 hours. Cardiac Enzymes: No results for input(s): CKTOTAL, CKMB, CKMBINDEX, TROPONINI in the last 168 hours. BNP (last 3 results) No results for input(s): PROBNP in the last 8760 hours. HbA1C: No results for input(s): HGBA1C in the last 72 hours. CBG: No results for input(s): GLUCAP in the last 168 hours. Lipid Profile: No results for input(s): CHOL, HDL, LDLCALC, TRIG, CHOLHDL, LDLDIRECT in the last 72 hours. Thyroid Function Tests: No results for input(s): TSH, T4TOTAL, FREET4, T3FREE, THYROIDAB in the last 72 hours. Anemia Panel: No results for input(s): VITAMINB12, FOLATE, FERRITIN, TIBC, IRON, RETICCTPCT in the last 72 hours. Sepsis Labs: No results for input(s): PROCALCITON, LATICACIDVEN in the last 168 hours.  No results found for this or any previous visit (from the past 240 hour(s)).        Radiology Studies: No results found.      Scheduled Meds: . albuterol  2.5 mg Nebulization Q6H  . aspirin EC  81 mg Oral Daily  . atorvastatin  40 mg Oral q1800  . budesonide (PULMICORT) nebulizer solution  0.25 mg Nebulization BID  . carvedilol  25 mg Oral BID WC  . dextromethorphan-guaiFENesin  1 tablet Oral BID  . enoxaparin (LOVENOX) injection  40 mg Subcutaneous Q24H  . furosemide  40 mg Intravenous Q12H  . hydrocortisone cream   Topical BID  . LORazepam  2 mg Oral BID  . magnesium oxide  400 mg Oral Daily  . nicotine  14 mg Transdermal Daily  . oxyCODONE-acetaminophen  1 tablet Oral BID  . pantoprazole  40 mg Oral Daily  . potassium chloride SA  20 mEq Oral BID  . rOPINIRole  0.25-0.75  mg Oral QHS  . sodium chloride flush  3 mL Intravenous Q12H  . umeclidinium-vilanterol  1 puff Inhalation Daily   Continuous Infusions: . sodium chloride       LOS: 0 days    Time spent: 19mins    Kathie Dike, MD [facility] Pager [phone]  If 7PM-7AM, please contact night-coverage [email or web address] Password [facility] 05/29/2018, 3:08 PM

## 2018-05-29 NOTE — Plan of Care (Signed)
Nutrition Education Note  RD consulted for nutrition education regarding acute on chronic onset CHF.  RD provided "Heart Failure Nutrition Therapy" handout from the [employer] which also includes review of fluid restriction.   Reviewed patient's dietary recall. His daily diet is very high is sodium. He frequently eats cheeseburgers, pizza, and french fries. Eats a variety of cheeses as snacks as well. Reports to eat 1 large meal daily in the evening. Also says he drinks a whiskey and coke every evening after work. He continues to smoke at home though he is on oxygen. He goes on to say he smokes in the other room away from his O2.  Provided examples on ways to decrease sodium intake in diet. Discouraged intake of processed foods and use of salt shaker. Encouraged fresh fruits and vegetables as well as whole grain sources of carbohydrates to maximize fiber intake.   RD discussed why it is important for patient to adhere to diet recommendations, and emphasized the role of fluids, foods to avoid, and importance of weighing self daily. Teach back method used.  Expect good compliance. Patient says he is ready to change his food choices and adopt a more healthy diet.   Body mass index is 28.98 kg/m. Pt meets criteria for overweight based on current BMI.  Current diet order is Heart Healthy and 1500 ml fluid restriction. Patient is consuming approximately 75-100% of meals at this time.  Labs and medications reviewed.   Goals: Begin a habit of reading food labels. Focus initially of foods he eats daily. Limit fast foods particularly- Cheeseburgers, french fries and Pizza. Choose to weigh himself daily in the morning and record his weight. Contact MD with 3-5 lb weight gain in less than 1 week or/ per MD instructions.  No further nutrition interventions warranted at this time. RD contact information provided. Recommend if MD wishes pt continue fluid restriction at discharge  this be emphasized. Currently he is unaware that he is limited to 6 cups daily.  Colman Cater MS,RD,CSG,LDN Office: [phone] Pager: [phone]

## 2018-05-29 NOTE — Progress Notes (Signed)
Patient and spouse education on heart failure - daily weights, elevating legs, medications.  Reinforcement needed for heart failure management.

## 2018-05-29 NOTE — Progress Notes (Signed)
Pt was on RA when I walked in to administer nebulizer. His SPO2 was 95%. I asked how long had he been off his O2 and he replied 3 or 4 mins.

## 2018-05-30 DIAGNOSIS — I5023 Acute on chronic systolic (congestive) heart failure: Secondary | ICD-10-CM

## 2018-05-30 DIAGNOSIS — J449 Chronic obstructive pulmonary disease, unspecified: Secondary | ICD-10-CM

## 2018-05-30 DIAGNOSIS — I5033 Acute on chronic diastolic (congestive) heart failure: Secondary | ICD-10-CM

## 2018-05-30 DIAGNOSIS — G2581 Restless legs syndrome: Secondary | ICD-10-CM

## 2018-05-30 DIAGNOSIS — C3411 Malignant neoplasm of upper lobe, right bronchus or lung: Secondary | ICD-10-CM

## 2018-05-30 LAB — BASIC METABOLIC PANEL
ANION GAP: 10 (ref 5–15)
BUN: 20 mg/dL (ref 8–23)
CO2: 30 mmol/L (ref 22–32)
CREATININE: 1.2 mg/dL (ref 0.61–1.24)
Calcium: 8.4 mg/dL — ABNORMAL LOW (ref 8.9–10.3)
Chloride: 94 mmol/L — ABNORMAL LOW (ref 98–111)
GFR calc Af Amer: >60 mL/min (ref >60)
GFR calc non Af Amer: >60 mL/min (ref >60)
GLUCOSE: 97 mg/dL (ref 70–99)
POTASSIUM: 4 mmol/L (ref 3.5–5.1)
SODIUM: 134 mmol/L — AB (ref 135–145)

## 2018-05-30 NOTE — Care Management Important Message (Signed)
Important Message  Patient Details  Name: Shaun May MRN: [id number] Date of Birth: August 27, 1951   Medicare Important Message Given:  Yes    Sherald Barge, RN 05/30/2018, 11:54 AM

## 2018-05-30 NOTE — Discharge Summary (Addendum)
Physician Discharge Summary  [name]:[id number] DOB: 06-Jan-1951 DOA: 05/26/2018  PCP: Celene Squibb, MD Cardiology: Bronson Ing  Admit date: 05/26/2018 Discharge date: 05/30/2018  Admitted From: Home  Disposition: Home   Recommendations for Outpatient Follow-up:  1. Follow up with PCP in 1 weeks 2. Follow up with cardiology on 8/1 as scheduled 3. Please obtain BMP/CBC in 1 week  Discharge Condition: STABLE   CODE STATUS: FULL    Brief Hospitalization Summary: Please see all hospital notes, images, labs for full details of the hospitalization. HPI: Shaun May is a 67 y.o. 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  This is worse when laying flat and he has had worsening bilateral lower extremity swelling and abdominal distention as well.  He was recently switched from Lasix 40 mg twice daily to torsemide 40 mg twice daily just today and he apparently took his first dose earlier this morning.  He called his cardiologist Dr. Bronson Ing and stated that he had gained approximately 12 pounds of weight and was told to come to the ED for further evaluation as he had persistent symptoms. He denies any cough, fever, chills, or chest pain.   ED Course: Vital signs are stable.  BNP is 380 and troponin is 0.01.  EKG with no acute findings.  Two-view chest x-ray with mild cardiomegaly and central vascular congestion as well as right upper lobe opacity which has been noted previously and is suspected to be related to radiation pneumonitis.  He has been given 80 mg of IV Lasix and appears to be starting to  diurese quite well.  1. Acute on chronic diastolic congestive heart failure.  He is feeling much better and feeling back to his baseline.  He wants to go home.  Echocardiogram shows ejection fraction of 60 to 65% with grade 2 diastolic dysfunction.  Treated with intravenous Lasix.   Weight is down approximately 12 pounds since admission.  Weight on admission noted to be 214 pounds.  2. Chronic respiratory failure with hypoxia.  Related to COPD.  He is chronically on 2 L of oxygen at bedtime.  He is off daytime oxygen and having good oxygenation and saturations.  3. COPD.  Wheezing is better.  Continue on bronchodilators and inhaled steroids. 4. Mild AKI.  Improved with diuresis.  Follow up with PCP next week for recheck of labs.  5. Ongoing tobacco use.  Nicotine patch ordered.  He was counseled to please stop using tobacco.  6. Paroxysmal atrial flutter.  Currently rate controlled.  Continue carvedilol.  Not on anticoagulation due to history of GI bleeding. 7. Restless leg syndrome.  Continue on Requip 8. GERD.  Continue on PPI 9. Hyperlipidemia.  Continue Lipitor.  DVT prophylaxis: Lovenox Code Status: Full code Family Communication: wife in room Disposition Plan: Discharge home once improved  Discharge Diagnoses:  Principal Problem:   Acute on chronic diastolic (congestive) heart failure (HCC) Active Problems:   COPD GOLD IV/ 02 dep at hs/ still smoking    Restless leg syndrome   Presumed cancer of right upper lobe of lung ([place])   Atrial flutter ([place]), paroxysmal   CHF (  congestive heart failure) (HCC)   CHF exacerbation ([address])  Discharge Instructions: Discharge Instructions    (HEART FAILURE PATIENTS) Call MD:  Anytime you have any of the following symptoms: 1) 3 pound weight gain in 24 hours or 5 pounds in 1 week 2) shortness of breath, with or without a dry hacking cough 3) swelling in the hands, feet or stomach 4) if you have to sleep on extra pillows at night in order to  breathe.   Complete by:  As directed    Call MD for:  difficulty breathing, headache or visual disturbances   Complete by:  As directed    Call MD for:  extreme fatigue   Complete by:  As directed    Call MD for:  persistant dizziness or light-headedness   Complete by:  As directed    Diet - low sodium heart healthy   Complete by:  As directed    Increase activity slowly   Complete by:  As directed      Allergies as of 05/30/2018      Reactions   Xarelto [rivaroxaban] Other (See Comments)   Caused bleeding.       Medication List    TAKE these medications   albuterol 108 (90 Base) MCG/ACT inhaler Commonly known as:  PROVENTIL HFA;VENTOLIN HFA Inhale 1-2 puffs into the lungs every 6 (six) hours as needed for wheezing or shortness of breath.   ANORO ELLIPTA 62.5-25 MCG/INH Aepb Generic drug:  umeclidinium-vilanterol Inhale 1 puff into the lungs daily.   aspirin EC 81 MG tablet Take 81 mg by mouth daily.   atorvastatin 40 MG tablet Commonly known as:  LIPITOR TAKE ONE TABLET BY MOUTH ONCE DAILY AT 6PM.   carvedilol 25 MG tablet Commonly known as:  COREG Take 25 mg by mouth 2 (two) times daily with a meal.   ibuprofen 800 MG tablet Commonly known as:  ADVIL,MOTRIN Take 1 tablet (800 mg total) by mouth 3 (three) times daily.   LORazepam 2 MG tablet Commonly known as:  ATIVAN Take 2 mg by mouth 2 (two) times daily.   Magnesium 400 MG Tabs Take 400 mg by mouth daily.   MUCINEX DM MAXIMUM STRENGTH 60-1200 MG Tb12 Take 1 tablet by mouth daily. What changed:  Another medication with the same name was removed. Continue taking this medication, and follow the directions you see here.   NYQUIL SEVERE COLD/FLU PO Take 1 Dose by mouth at bedtime.   oxyCODONE-acetaminophen 5-325 MG tablet Commonly known as:  PERCOCET/ROXICET Take 1 tablet by mouth 2 (two) times daily. *May take one tablet three times daily as needed for pain   OXYGEN Inhale 2 L into the lungs at bedtime.  **2L with sleep and exertion if needed   pantoprazole 40 MG tablet Commonly known as:  PROTONIX TAKE 1 TABLET BY MOUTH DAILY BEFORE BREAKFAST.   potassium chloride SA 20 MEQ tablet Commonly known as:  K-DUR,KLOR-CON Take 1 tablet (20 mEq total) by mouth 2 (two) times daily.   rOPINIRole 0.25 MG tablet Commonly known as:  REQUIP Take 0.25-0.75 mg by mouth at bedtime.   sodium chloride 0.65 % Soln nasal spray Commonly known as:  OCEAN Place 1 spray into both nostrils as needed for congestion.   torsemide 20 MG tablet Commonly known as:  DEMADEX Take 1 tablet (20 mg total) by mouth 2 (two) times daily.      Follow-up Information    Celene Squibb, MD. Schedule an appointment as soon  as possible for a visit in 1 week(s).   Specialty:  Internal Medicine Why:  Hospital Follow Up  Contact information: [facility] [address] [phone]        Herminio Commons, MD. Schedule an appointment as soon as possible for a visit in 2 week(s).   Specialty:  Cardiology Why:  Hospital Follow Up  Contact information: [place] [address] [phone]          Allergies  Allergen Reactions  . Xarelto [Rivaroxaban] Other (See Comments)    Caused bleeding.    Allergies as of 05/30/2018      Reactions   Xarelto [rivaroxaban] Other (See Comments)   Caused bleeding.       Medication List    TAKE these medications   albuterol 108 (90 Base) MCG/ACT inhaler Commonly known as:  PROVENTIL HFA;VENTOLIN HFA Inhale 1-2 puffs into the lungs every 6 (six) hours as needed for wheezing or shortness of breath.   ANORO ELLIPTA 62.5-25 MCG/INH Aepb Generic drug:  umeclidinium-vilanterol Inhale 1 puff into the lungs daily.   aspirin EC 81 MG tablet Take 81 mg by mouth daily.   atorvastatin 40 MG tablet Commonly known as:  LIPITOR TAKE ONE TABLET BY MOUTH ONCE DAILY AT 6PM.   carvedilol 25 MG tablet Commonly known as:  COREG Take 25 mg by mouth 2  (two) times daily with a meal.   ibuprofen 800 MG tablet Commonly known as:  ADVIL,MOTRIN Take 1 tablet (800 mg total) by mouth 3 (three) times daily.   LORazepam 2 MG tablet Commonly known as:  ATIVAN Take 2 mg by mouth 2 (two) times daily.   Magnesium 400 MG Tabs Take 400 mg by mouth daily.   MUCINEX DM MAXIMUM STRENGTH 60-1200 MG Tb12 Take 1 tablet by mouth daily. What changed:  Another medication with the same name was removed. Continue taking this medication, and follow the directions you see here.   NYQUIL SEVERE COLD/FLU PO Take 1 Dose by mouth at bedtime.   oxyCODONE-acetaminophen 5-325 MG tablet Commonly known as:  PERCOCET/ROXICET Take 1 tablet by mouth 2 (two) times daily. *May take one tablet three times daily as needed for pain   OXYGEN Inhale 2 L into the lungs at bedtime. **2L with sleep and exertion if needed   pantoprazole 40 MG tablet Commonly known as:  PROTONIX TAKE 1 TABLET BY MOUTH DAILY BEFORE BREAKFAST.   potassium chloride SA 20 MEQ tablet Commonly known as:  K-DUR,KLOR-CON Take 1 tablet (20 mEq total) by mouth 2 (two) times daily.   rOPINIRole 0.25 MG tablet Commonly known as:  REQUIP Take 0.25-0.75 mg by mouth at bedtime.   sodium chloride 0.65 % Soln nasal spray Commonly known as:  OCEAN Place 1 spray into both nostrils as needed for congestion.   torsemide 20 MG tablet Commonly known as:  DEMADEX Take 1 tablet (20 mg total) by mouth 2 (two) times daily.       Procedures/Studies: Dg Chest 2 View  Result Date: 05/26/2018 CLINICAL DATA:  Short of breath with recent weight gain EXAM: CHEST - 2 VIEW COMPARISON:  CT 05/17/2018, radiograph 05/17/2018, 12/28/2017 FINDINGS: Mild diffuse bronchitic changes. Surgical clips in the right upper lung as before. Increased interstitial opacity in the right upper lobe, likely corresponding to CT demonstrated post treatment changes. Patchy focus of atelectasis in the left lower lung. No pleural effusion.  Mild cardiomegaly with aortic atherosclerosis. Slight central vascular congestion. No  pneumothorax. Degenerative changes of the spine. IMPRESSION: 1. Borderline to mild cardiomegaly with slight central vascular congestion 2. Increased interstitial opacity in the right upper lobe, presumably corresponding to CT demonstrated post treatment changes. Electronically Signed   By: Donavan Foil M.D.   On: 05/26/2018 21:24   Dg Chest 2 View  Result Date: 05/17/2018 CLINICAL DATA:  Shortness of breath and leg swelling. History of lung cancer, COPD, CHF. EXAM: CHEST - 2 VIEW COMPARISON:  Chest radiograph May 08, 2018 FINDINGS: Cardiac silhouette is mildly enlarged. Mediastinal silhouette is not suspicious, calcified aortic arch. Dual lead LEFT cardiac pacemaker in situ. Masslike RIGHT upper lobe airspace opacity with new surgical clip/fiducial markers. Pulmonary vascular congestion appears normal. No pleural effusion. No pneumothorax. Moderate degenerative changes thoracic spine. IMPRESSION: More conspicuous masslike airspace opacity RIGHT upper lobe with fiducial marker/surgical clips. Tumor progression is possible. Stable cardiomegaly. Electronically Signed   By: Elon Alas M.D.   On: 05/17/2018 16:15   Ct Angio Chest Pe W And/or Wo Contrast  Result Date: 05/17/2018 CLINICAL DATA:  67 year old male with history of chronic shortness of breath worsening over the past 2-3 days. EXAM: CT ANGIOGRAPHY CHEST WITH CONTRAST TECHNIQUE: Multidetector CT imaging of the chest was performed using the standard protocol during bolus administration of intravenous contrast. Multiplanar CT image reconstructions and MIPs were obtained to evaluate the vascular anatomy. CONTRAST:  190mL ISOVUE-370 IOPAMIDOL (ISOVUE-370) INJECTION 76% COMPARISON:  None. FINDINGS: Cardiovascular: Heart size is normal. Small amount of pericardial fluid and/or thickening with subtle pericardial enhancement. No pericardial calcification. There is  aortic atherosclerosis, as well as atherosclerosis of the great vessels of the mediastinum and the coronary arteries, including calcified atherosclerotic plaque in the left main, left anterior descending, left circumflex and right coronary arteries. Mediastinum/Nodes: No pathologically enlarged mediastinal or hilar lymph nodes. Esophagus is unremarkable in appearance. No axillary lymphadenopathy. Lungs/Pleura: Fiducial markers are noted in the right upper lobe around the previously noted right upper lobe nodule. On today's examination, the nodule is less well-defined than the prior study, but measures approximately 2.1 x 1.5 cm. Surrounding ground-glass attenuation and septal thickening is most compatible with evolving postradiation changes. No other suspicious appearing pulmonary nodules or masses are noted. No consolidative airspace disease elsewhere in the lungs. Mild diffuse bronchial wall thickening with mild centrilobular and paraseptal emphysema. Trace right pleural effusion lying dependently. Upper Abdomen: Aortic atherosclerosis. Diffuse low attenuation throughout the visualized portions of the hepatic parenchyma, indicative of hepatic steatosis. Musculoskeletal: There are no aggressive appearing lytic or blastic lesions noted in the visualized portions of the skeleton. Review of the MIP images confirms the above findings. IMPRESSION: 1. No evidence of pulmonary embolism. 2. Evolving postradiation changes in the right upper lobe adjacent to the previously noted right upper lobe nodule, most compatible with evolving postradiation pneumonitis. 3. Small amount of pericardial fluid and/or thickening with some pericardial enhancement. Clinical correlation for signs and symptoms of pericarditis is recommended. 4. Aortic atherosclerosis, in addition to left main and 3 vessel coronary artery disease. Please note that although the presence of coronary artery calcium documents the presence of coronary artery disease,  the severity of this disease and any potential stenosis cannot be assessed on this non-gated CT examination. Assessment for potential risk factor modification, dietary therapy or pharmacologic therapy may be warranted, if clinically indicated. 5. Mild diffuse bronchial wall thickening with mild centrilobular and paraseptal emphysema; imaging findings suggestive of underlying COPD. 6. Hepatic steatosis. Aortic Atherosclerosis (ICD10-I70.0) and Emphysema (ICD10-J43.9). Electronically Signed  By: Vinnie Langton M.D.   On: 05/17/2018 18:13      Subjective: Pt says that he is feeling a lot better.  He feels stable to go home today.  He feels like his baseline when feeling well.  He has been ambulating.    Discharge Exam: Vitals:   05/30/18 0735 05/30/18 0743  BP:    Pulse:    Resp:    Temp:    SpO2: 98% 96%   Vitals:   05/30/18 0634 05/30/18 0730 05/30/18 0735 05/30/18 0743  BP: 117/73     Pulse: 60     Resp: 15     Temp: 98.3 F (36.8 C)     TempSrc: Oral     SpO2: 97% 98% 98% 96%  Weight:       General: Pt is alert, awake, not in acute distress Cardiovascular: normal S1/S2 +, no rubs, no gallops Respiratory: CTA bilaterally, no wheezing, no rhonchi Abdominal: Soft, NT, ND, bowel sounds + Extremities: trace pretibial edema, no cyanosis   The results of significant diagnostics from this hospitalization (including imaging, microbiology, ancillary and laboratory) are listed below for reference.     Microbiology: No results found for this or any previous visit (from the past 240 hour(s)).   Labs: BNP (last 3 results) Recent Labs    08/01/17 0928 05/17/18 1457 05/26/18 2041  BNP 146.0* 262.0* 989.2*   Basic Metabolic Panel: Recent Labs  Lab 05/26/18 2041 05/27/18 0527 05/27/18 0528 05/28/18 0634 05/29/18 0406 05/30/18 0432  NA 133*  --  135 134* 135 134*  K 4.7  --  4.4 4.2 4.1 4.0  CL 97*  --  94* 95* 94* 94*  CO2 26  --  31 29 31 30   GLUCOSE 88  --  100* 97  104* 97  BUN 26*  --  24* 21* 20 20  CREATININE 1.46*  --  1.32* 1.10 1.16 1.20  CALCIUM 8.2*  --  8.3* 8.0* 8.0* 8.4*  MG  --  1.0*  --   --  1.9  --    Liver Function Tests: No results for input(s): AST, ALT, ALKPHOS, BILITOT, PROT, ALBUMIN in the last 168 hours. No results for input(s): LIPASE, AMYLASE in the last 168 hours. No results for input(s): AMMONIA in the last 168 hours. CBC: Recent Labs  Lab 05/26/18 2041 05/27/18 0528 05/28/18 0634  WBC 9.8 8.7 7.0  NEUTROABS  --   --  5.6  HGB 10.5* 10.3* 9.5*  HCT 33.9* 32.8* 30.7*  MCV 99.1 98.8 99.0  PLT 244 232 209   Cardiac Enzymes: No results for input(s): CKTOTAL, CKMB, CKMBINDEX, TROPONINI in the last 168 hours. BNP: Invalid input(s): POCBNP CBG: No results for input(s): GLUCAP in the last 168 hours. D-Dimer No results for input(s): DDIMER in the last 72 hours. Hgb A1c No results for input(s): HGBA1C in the last 72 hours. Lipid Profile No results for input(s): CHOL, HDL, LDLCALC, TRIG, CHOLHDL, LDLDIRECT in the last 72 hours. Thyroid function studies No results for input(s): TSH, T4TOTAL, T3FREE, THYROIDAB in the last 72 hours.  Invalid input(s): FREET3 Anemia work up No results for input(s): VITAMINB12, FOLATE, FERRITIN, TIBC, IRON, RETICCTPCT in the last 72 hours. Urinalysis    Component Value Date/Time   COLORURINE YELLOW 05/25/2016 2318   APPEARANCEUR CLEAR 05/25/2016 2318   LABSPEC <1.005 (L) 05/25/2016 2318   PHURINE 5.5 05/25/2016 2318   GLUCOSEU NEGATIVE 05/25/2016 2318   HGBUR NEGATIVE 05/25/2016 2318   BILIRUBINUR  NEGATIVE 05/25/2016 2318   [place] 05/25/2016 2318   PROTEINUR NEGATIVE 05/25/2016 2318   NITRITE NEGATIVE 05/25/2016 2318   LEUKOCYTESUR NEGATIVE 05/25/2016 2318   Sepsis Labs Invalid input(s): PROCALCITONIN,  WBC,  LACTICIDVEN Microbiology No results found for this or any previous visit (from the past 240 hour(s)).  Time coordinating discharge: 33  minutes SIGNED:  Irwin Brakeman, MD  [facility] 05/30/2018, 10:58 AM Pager [phone]  If 7PM-7AM, please contact night-coverage [email or web address] Password [email or web address]

## 2018-05-30 NOTE — Progress Notes (Signed)
Patient discharged home.  IV removed - WNL.  Reviewed AVS and medications.  Educated on HF management.  Patient able to verbalize understanding of importance of daily weights, low salt diet, and following up with MD.  No questions at this time.  Assisted off unit in NAD

## 2018-05-30 NOTE — Discharge Instructions (Signed)
Follow with Primary MD  Celene Squibb, MD  and other consultants as instructed your Hospitalist MD  Please get a complete blood count and chemistry panel checked by your Primary MD at your next visit, and again as instructed by your Primary MD.  Get Medicines reviewed and adjusted: Please take all your medications with you for your next visit with your Primary MD  Laboratory/radiological data: Please request your Primary MD to go over all hospital tests and procedure/radiological results at the follow up, please ask your Primary MD to get all Hospital records sent to his/her office.  In some cases, they will be blood work, cultures and biopsy results pending at the time of your discharge. Please request that your primary care M.D. follows up on these results.  Also Note the following: If you experience worsening of your admission symptoms, develop shortness of breath, life threatening emergency, suicidal or homicidal thoughts you must seek medical attention immediately by calling 911 or calling your MD immediately  if symptoms less severe.  You must read complete instructions/literature along with all the possible adverse reactions/side effects for all the Medicines you take and that have been prescribed to you. Take any new Medicines after you have completely understood and accpet all the possible adverse reactions/side effects.   Do not drive when taking Pain medications or sleeping medications (Benzodaizepines)  Do not take more than prescribed Pain, Sleep and Anxiety Medications. It is not advisable to combine anxiety,sleep and pain medications without talking with your primary care practitioner  Special Instructions: If you have smoked or chewed Tobacco  in the last 2 yrs please stop smoking, stop any regular Alcohol  and or any Recreational drug use.  Wear Seat belts while driving.  Please note: You were cared for by a hospitalist during your hospital stay. Once you are discharged,  your primary care physician will handle any further medical issues. Please note that NO REFILLS for any discharge medications will be authorized once you are discharged, as it is imperative that you return to your primary care physician (or establish a relationship with a primary care physician if you do not have one) for your post hospital discharge needs so that they can reassess your need for medications and monitor your lab values.      Heart Failure Heart failure means your heart has trouble pumping blood. This makes it hard for your body to work well. Heart failure is usually a long-term (chronic) condition. You must take good care of yourself and follow your doctor's treatment plan. Follow these instructions at home:  Take your heart medicine as told by your doctor. ? Do not stop taking medicine unless your doctor tells you to. ? Do not skip any dose of medicine. ? Refill your medicines before they run out. ? Take other medicines only as told by your doctor or pharmacist.  Stay active if told by your doctor. The elderly and people with severe heart failure should talk with a doctor about physical activity.  Eat heart-healthy foods. Choose foods that are without trans fat and are low in saturated fat, cholesterol, and salt (sodium). This includes fresh or frozen fruits and vegetables, fish, lean meats, fat-free or low-fat dairy foods, whole grains, and high-fiber foods. Lentils and dried peas and beans (legumes) are also good choices.  Limit salt if told by your doctor.  Cook in a healthy way. Roast, grill, broil, bake, poach, steam, or stir-fry foods.  Limit fluids as told by your doctor.  Weigh yourself every morning. Do this after you pee (urinate) and before you eat breakfast. Write down your weight to give to your doctor.  Take your blood pressure and write it down if your doctor tells you to.  Ask your doctor how to check your pulse. Check your pulse as told.  Lose weight  if told by your doctor.  Stop smoking or chewing tobacco. Do not use gum or patches that help you quit without your doctor's approval.  Schedule and go to doctor visits as told.  Nonpregnant women should have no more than 1 drink a day. Men should have no more than 2 drinks a day. Talk to your doctor about drinking alcohol.  Stop illegal drug use.  Stay current with shots (immunizations).  Manage your health conditions as told by your doctor.  Learn to manage your stress.  Rest when you are tired.  If it is really hot outside: ? Avoid intense activities. ? Use air conditioning or fans, or get in a cooler place. ? Avoid caffeine and alcohol. ? Wear loose-fitting, lightweight, and light-colored clothing.  If it is really cold outside: ? Avoid intense activities. ? Layer your clothing. ? Wear mittens or gloves, a hat, and a scarf when going outside. ? Avoid alcohol.  Learn about heart failure and get support as needed.  Get help to maintain or improve your quality of life and your ability to care for yourself as needed. Contact a doctor if:  You gain weight quickly.  You are more short of breath than usual.  You cannot do your normal activities.  You tire easily.  You cough more than normal, especially with activity.  You have any or more puffiness (swelling) in areas such as your hands, feet, ankles, or belly (abdomen).  You cannot sleep because it is hard to breathe.  You feel like your heart is beating fast (palpitations).  You get dizzy or light-headed when you stand up. Get help right away if:  You have trouble breathing.  There is a change in mental status, such as becoming less alert or not being able to focus.  You have chest pain or discomfort.  You faint. This information is not intended to replace advice given to you by your health care provider. Make sure you discuss any questions you have with your health care provider. Document Released:  08/31/2008 Document Revised: 04/29/2016 Document Reviewed: 01/08/2013 Elsevier Interactive Patient Education  2017 [place].   Heart Failure Action Plan A heart failure action plan helps you understand what to do when you have symptoms of heart failure. Follow the plan that was created by you and your health care provider. Review your plan each time you visit your health care provider. Red zone These signs and symptoms mean you should get medical help right away:  You have trouble breathing when resting.  You have a dry cough that is getting worse.  You have swelling or pain in your legs or abdomen that is getting worse.  You suddenly gain more than 2-3 lb (0.9-1.4 kg) in a day, or more than 5 lb (2.3 kg) in one week. This amount may be more or less depending on your condition.  You have trouble staying awake or you feel confused.  You have chest pain.  You do not have an appetite.  You pass out.  If you experience any of these symptoms:  Call your local emergency services (911 in the [place] right away or seek help at  the emergency department of the nearest hospital.  Yellow zone These signs and symptoms mean your condition may be getting worse and you should make some changes:  You have trouble breathing when you are active or you need to sleep with extra pillows.  You have swelling in your legs or abdomen.  You gain 2-3 lb (0.9-1.4 kg) in one day, or 5 lb (2.3 kg) in one week. This amount may be more or less depending on your condition.  You get tired easily.  You have trouble sleeping.  You have a dry cough.  If you experience any of these symptoms:  Contact your health care provider within the next day.  Your health care provider may adjust your medicines.  Green zone These signs mean you are doing well and can continue what you are doing:  You do not have shortness of breath.  You have very little swelling or no new swelling.  Your weight is stable  (no gain or loss).  You have a normal activity level.  You do not have chest pain or any other new symptoms.  Follow these instructions at home:  Take over-the-counter and prescription medicines only as told by your health care provider.  Weigh yourself daily. Your target weight is __________ lb (__________ kg). ? Call your health care provider if you gain more than __________ lb (__________ kg) in a day, or more than __________ lb (__________ kg) in one week.  Eat a heart-healthy diet. Work with a diet and nutrition specialist (dietitian) to create an eating plan that is best for you.  Keep all follow-up visits as told by your health care provider. This is important. Where to find more information:  American Heart Association: www.heart.org Summary  Follow the action plan that was created by you and your health care provider.  Get help right away if you have any symptoms in the Red zone. This information is not intended to replace advice given to you by your health care provider. Make sure you discuss any questions you have with your health care provider. Document Released: 01/01/2017 Document Revised: 01/01/2017 Document Reviewed: 01/01/2017 Elsevier Interactive Patient Education  2018 [place].   Heart Failure Eating Plan Heart failure, also called congestive heart failure, occurs when your heart does not pump blood well enough to meet your body's needs for oxygen-rich blood. Heart failure is a long-term (chronic) condition. Living with heart failure can be challenging. However, following your health care provider's instructions about a healthy lifestyle and working with a diet and nutrition specialist (dietitian) to choose the right foods may help to improve your symptoms. What are tips for following this plan? General guidelines  Do not eat more than 2,300 mg of salt (sodium) a day. The amount of sodium that is recommended for you may be lower, depending on your  condition.  Maintain a healthy body weight as directed. Ask your health care provider what a healthy weight is for you. ? Check your weight every day. ? Work with your health care provider and dietitian to make a plan that is right for you to lose weight or maintain your current weight.  Limit how much fluid you drink. Ask your health care provider or dietitian how much fluid you can have each day.  Limit or avoid alcohol as told by your health care provider or dietitian. Reading food labels  Check food labels for the amount of sodium per serving. Choose foods that have less than 140 mg (milligrams) of  sodium in each serving.  Check food labels for the number of calories per serving. This is important if you need to limit your daily calorie intake to lose weight.  Check food labels for the serving size. If you eat more than one serving, you will be eating more sodium and calories than what is listed on the label.  Look for foods that are labeled as "sodium-free," "very low sodium," or "low sodium." ? Foods labeled as "reduced sodium" or "lightly salted" may still have more sodium than what is recommended for you. Cooking  Avoid adding salt when cooking. Ask your health care provider or dietitian before using salt substitutes.  Season food with salt-free seasonings, spices, or herbs. Check the label of seasoning mixes to make sure they do not contain salt.  Cook with heart-healthy oils, such as olive, canola, soybean, or sunflower oil.  Do not fry foods. Cook foods using low-fat methods, such as baking, boiling, grilling, and broiling.  Limit unhealthy fats when cooking by: ? Removing the skin from poultry, such as chicken. ? Removing all visible fats from meats. ? Skimming the fat off from stews, soups, and gravies before serving them. Meal planning  Limit your intake of: ? Processed, canned, or pre-packaged foods. ? Foods that are high in trans fat, such as fried  foods. ? Sweets, desserts, sugary drinks, and other foods with added sugar. ? Full-fat dairy products, such as whole milk.  Eat a balanced diet that includes: ? 4-5 servings of fruit each day and 4-5 servings of vegetables each day. At each meal, try to fill half of your plate with fruits and vegetables. ? Up to 6-8 servings of whole grains each day. ? Up to 2 servings of lean meat, poultry, or fish each day. One serving of meat is equal to 3 oz. This is about the same size as a deck of cards. ? 2 servings of low-fat dairy each day. ? Heart-healthy fats. Healthy fats called omega-3 fatty acids are found in foods such as flaxseed and cold-water fish like sardines, salmon, and mackerel.  Aim to eat 25-35 g (grams) of fiber a day. Foods that are high in fiber include apples, broccoli, carrots, beans, peas, and whole grains.  Do not add salt or condiments that contain salt (such as soy sauce) to foods before eating.  When eating at a restaurant, ask that your food be prepared with less salt or no salt, if possible.  Try to eat 2 or more vegetarian meals each week.  Eat more home-cooked food and eat less restaurant, buffet, and fast food. Recommended foods The items listed may not be a complete list. Talk with your dietitian about what dietary choices are best for you. Grains Bread with less than 80 mg of sodium per slice. Whole-wheat pasta, quinoa, and brown rice. Oats and oatmeal. Barley. [place]. Grits and cream of wheat. Whole-grain and whole-wheat cold cereal. Vegetables All fresh vegetables. Vegetables that are frozen without sauce or added salt. Low-sodium or sodium-free canned vegetables. Fruits All fresh, frozen, and canned fruits. Dried fruits, such as raisins, prunes, and cranberries. Meats and other protein foods Lean cuts of meat. Skinless chicken and [place]. Fish with high omega-3 fatty acids, such as salmon, sardines, and other cold-water fishes. Eggs. Dried beans, peas, and  edamame. Unsalted nuts and nut butters. Dairy Low-fat or nonfat (skim) milk and dried milk. Rice milk, soy milk, and almond milk. Low-fat or nonfat yogurt. Small amounts of reduced-sodium block cheese. Low-sodium cottage  cheese. Fats and oils Olive, canola, soybean, flaxseed, or sunflower oil. Avocado. Sweets and desserts Apple sauce. Granola bars. Sugar-free pudding and gelatin. Frozen fruit bars. Seasoning and other foods Fresh and dried herbs. Lemon or lime juice. Vinegar. Low-sodium ketchup. Salt-free marinades, salad dressings, sauces, and seasonings. Foods to avoid The items listed may not be a complete list. Talk with your dietitian about what dietary choices are best for you. Grains Bread with more than 80 mg of sodium per slice. Hot or cold cereal with more than 140 mg sodium per serving. Salted pretzels and crackers. Pre-packaged breadcrumbs. Bagels, croissants, and biscuits. Vegetables Canned vegetables. Frozen vegetables with sauce or seasonings. Creamed vegetables. [place] fries. Onion rings. Pickled vegetables and sauerkraut. Fruits Fruits that are dried with sodium-containing preservatives. Meats and other protein foods Ribs and chicken wings. Bacon, ham, pepperoni, bologna, salami, and packaged luncheon meats. Hot dogs, bratwurst, and sausage. Canned meat. Smoked meat and fish. Salted nuts and seeds. Dairy Whole milk, half-and-half, and cream. Buttermilk. Processed cheese, cheese spreads, and cheese curds. Regular cottage cheese. Feta cheese. Shredded cheese. String cheese. Fats and oils Butter, lard, shortening, ghee, and bacon fat. Canned and packaged gravies. Seasoning and other foods Onion salt, garlic salt, table salt, and sea salt. Marinades. Regular salad dressings. Relishes, pickles, and olives. Meat flavorings and tenderizers, and bouillon cubes. Horseradish, ketchup, and mustard. Worcestershire sauce. Teriyaki sauce, soy sauce (including reduced sodium). Hot sauce and  Tabasco sauce. Steak sauce, fish sauce, oyster sauce, and cocktail sauce. Taco seasonings. Barbecue sauce. Tartar sauce. Summary  A heart failure eating plan includes changes that limit your intake of sodium and unhealthy fat, and it may help you lose weight or maintain a healthy weight. Your health care provider may also recommend limiting how much fluid you drink.  Most people with heart failure should eat no more than 2,300 mg of salt (sodium) a day. The amount of sodium that is recommended for you may be lower, depending on your condition.  Contact your health care provider or dietitian before making any major changes to your diet. This information is not intended to replace advice given to you by your health care provider. Make sure you discuss any questions you have with your health care provider. Document Released: 04/08/2017 Document Revised: 04/08/2017 Document Reviewed: 04/08/2017 Elsevier Interactive Patient Education  2018 [place].   Heart Failure Medicines Heart failure is a condition in which the heart cannot pump enough blood through the body. This can cause symptoms such as shortness of breath, fatigue, and confusion. There are two types of heart failure:  Heart failure with reduced ejection fraction. In this type, the heart muscle is weak.  Heart failure with preserved ejection fraction. In this type, the heart muscle does not fill with blood properly and may be stiff.  There is no cure for heart failure. However, being treated with medicines and following your health care provider's instructions about a healthy lifestyle can help you stay active, avoid problems, and live longer. Talk to your health care provider about all medicines that you are taking, how often you should take them, and what possible problems (side effects) they may cause. Talk with your health care provider if you have difficulty affording your medicines. What are some common medicines for heart  failure? The medicines that are prescribed for you will depend on your symptoms, the type of heart failure you have, and the cause of your heart failure. In some cases, you may need to take more  than one medicine. You will be prescribed the following medicines according to your type of heart failure: Heart failure with reduced ejection fraction  Beta-blockers.  Angiotensin-converting enzyme (ACE) inhibitors.  Angiotensin II receptor blockers (ARBs).  Angiotensin receptor neprilysin inhibitors (ARNIs).  Aldosterone antagonists.  Diuretics.  Digoxin.  Nitrates. Heart failure with preserved ejection fraction  Medicines to control blood pressure, including: ? Beta-blockers. ? Angiotensin-converting enzyme (ACE) inhibitors. ? Angiotensin II receptor blockers (ARBs).  Diuretics.  Aldosterone antagonists. What should I know about beta-blockers?  These medicines lower your blood pressure and slow your heart rate. This helps to lessen your heart's workload.  They can help to relieve chest pain (angina).  They can help to improve your heart's ability to pump.  They may cause asthma attacks and shortness of breath.  Because these medicines slow your heart rate, it is important not to overwork yourself while exercising. Talk to your health care provider about what your target heart rate should be while you exercise.  These medicines can hide the symptoms of low blood sugar (glucose), which is also called hypoglycemia. If you have diabetes, make sure to check your blood glucose carefully. If you have hypoglycemia, talk to your health care provider about adjusting your medicines.  Beta-blockers may make you feel dizzy or light-headed at first. Do not drive or use heavy machinery when you first start these medicines. Ask your health care provider when it is safe for you to drive. What should I know about ACE inhibitors or ARBs?  These medicines help to widen arteries and veins. This  action lowers your blood pressure and lessens the strain on your heart, making it easier for your heart to pump.  They can help to lessen the symptoms of heart failure.  ARBs are often used if a person cannot take ACE inhibitors.  ACE inhibitors may cause a dry cough.  In rare cases, ACE inhibitors and ARBs can cause swelling of the tongue or lips, other swelling, taste problems, and skin rashes. If these symptoms occur, stop taking the medicines and contact your health care provider.  Do not take ACE inhibitors if you are pregnant or may become pregnant. These medicines can cause health problems in an unborn baby.  These medicines may cause dizziness. You may need regular checkups and blood tests to monitor how they are working. What should I know about ARNIs?  These medicines are a combination of an ARB and another medicine. They lower your blood pressure.  Side effects may include dry cough, dizziness, low blood pressure, and kidney problems.  Do not take ARNIs if you are already taking ACE inhibitors or ARBs.  You may notice increased urination when taking these medicines.  ARNIs can raise the amount of potassium in the blood. Your potassium levels will be monitored regularly by your health care provider. What should I know about aldosterone antagonists?  They help the body to remove excess sodium through urination. This helps to lessen the amount of blood that the heart needs to pump.  They can also help to lower blood pressure and improve the heart's ability to pump blood.  They may cause dizziness, diarrhea, coughing, or flu-like symptoms.  They should not be used if you have type 2 diabetes.  They can raise the amount of potassium in the blood. Your potassium levels will be monitored regularly by your health care provider.  These medicines can make men's breasts large and tender. What should I know about diuretics?  Diuretics are  medicines that help the body get rid of  excess fluid through urination. They can also help lessen your heart's workload.  They help to lessen fluid buildup in the lungs, ankles, and feet.  They help to lower your blood pressure.  They can worsen problems with controlling urination (urinary incontinence).  They may cause dizziness, headaches, muscle cramps, and an upset stomach.  They can cause weak muscles, dry mouth, or confusion. It is important to drink plenty of fluids while taking these medicines, especially while exercising or on hot days. What should I know about digoxin?  Digoxin helps the heart pump more blood efficiently. It also lowers your heart rate.  It can help ease heart failure symptoms and may be used if other medicines do not work.  It can also help with irregular heartbeat (arrhythmia).  It may cause stomach problems, fatigue, headache, drowsiness, or vision problems. What should I know about nitrates?  Nitrates relax the blood vessels and increase oxygen and blood supply to the heart. They also lower the blood pressure.  They are usually taken to lessen chest pain.  They may cause headaches, flushing, or irregular heartbeat. Summary  A healthy lifestyle and treatment with medicine will relieve symptoms of heart failure.  In some cases, you may need to take more than one medicine.  It is important to talk to your health care provider about how often you should take your medicines. Do not skip a dose or change your dosage.  Talk to your heaLth care provider about possible side effects of these medicines. This information is not intended to replace advice given to you by your health care provider. Make sure you discuss any questions you have with your health care provider. Document Released: 04/08/2017 Document Revised: 04/08/2017 Document Reviewed: 04/08/2017 Elsevier Interactive Patient Education  2018 [place].   Heart Failure and Exercise Heart failure is a condition in which the heart  does not fill or pump enough blood and oxygen to support your body and its functions. Heart failure is a long-term (chronic) condition. Living with heart failure can be challenging. However, following your health care provider's instructions about a healthy lifestyle may help improve your symptoms. This includes choosing the right exercise plan. Doing daily physical activity is important after a diagnosis of heart failure. You may have some activity restrictions, so talk to your health care provider before doing any exercises. What are the benefits of exercise? Exercise may:  Make your heart muscles stronger.  Lower your blood pressure.  Lower your cholesterol.  Help you lose weight.  Help your bones stay strong.  Improve your blood circulation.  Help your body use oxygen better. This relieves symptoms such as fatigue and shortness of breath.  Help your mental health by lowering the risk of depression and other problems.  Improve your quality of life.  Decrease your chance of hospital admission for heart failure.  What is an exercise plan? An exercise plan is a set of specific exercises and training activities. You will work with your health care provider to create the exercise plan that works for you. The plan may include:  Different types of exercises and how to do them.  Cardiac rehabilitation exercises. These are supervised programs that are designed to strengthen your heart.  What are strengthening exercises? Strengthening exercises are a type of physical activity that involves using resistance to improve your muscle strength. Strengthening exercises usually have repetitive motions. These types of exercises can include:  Lifting weights.  Using weight machines.  Using resistance tubes and bands.  Using kettlebells.  Using your body weight, such as doing push-ups or squats.  What are balance exercises? Balance exercises are another type of physical activity. They  strengthen the muscles of the back, stomach, and pelvis (core muscles) and improve your balance. They can also lower your risk of falling. These types of exercises can include:  Standing on one leg.  Walking backward, sideways, and in a straight line.  Standing up after sitting, without using your hands.  Shifting your weight from one leg to the other.  Lifting one leg in front of you.  Doing tai chi. This is a type of exercise that uses slow movements and deep breathing.  How can I increase my flexibility? Having better flexibility can keep you from falling. It can also lengthen your muscles, improve your range of motion, and help your joints. You can increase your flexibility by:  Doing tai chi.  Doing yoga.  Stretching.  How much aerobic exercise should I get? Aerobic exercises strengthen your breathing and circulation system and increase your body's use of oxygen. Examples of aerobic exercise include biking, walking, running, and swimming. Talk to your health care provider to find out how much aerobic exercise is safe for you.  To do these exercises:  Start exercising slowly, limiting the amount of time at first. You may need to start with 5 minutes of aerobic exercise every day.  Slowly add more minutes until you can safely do at least 30 minutes of exercise at least 4 days a week.  Summary  Daily physical activity is important after a diagnosis of heart failure.  Exercise can make your heart muscles stronger. It also offers other benefits that will improve your health.  Talk to your health care provider before doing any exercises. This information is not intended to replace advice given to you by your health care provider. Make sure you discuss any questions you have with your health care provider. Document Released: 04/05/2017 Document Revised: 04/05/2017 Document Reviewed: 04/05/2017 Elsevier Interactive Patient Education  2018 [place] With Heart  Failure  Heart failure is a long-term (chronic) condition in which the heart cannot pump enough blood through the body. When this happens, parts of the body do not get the blood and oxygen they need. There is no cure for heart failure at this time, so it is important for you to take good care of yourself and follow the treatment plan set by your health care provider. If you are living with heart failure, there are ways to help you manage the disease. Follow these instructions at home: Living with heart failure requires you to make changes in your life. Your health care team will teach you about the changes you need to make in order to relieve your symptoms and lower your risk of going to the hospital. Follow the treatment plan as set by your health care provider. Medicines Medicines are important in reducing your heart's workload, slowing the progression of heart failure, and improving your symptoms.  Take over-the-counter and prescription medicines only as told by your health care provider.  Do not stop taking your medicine unless your health care provider tells you to do that.  Do not skip any dose of your medicine.  Refill prescriptions before you run out of medicine. You need your medicines every day.  Eating and drinking  Eat heart-healthy foods. Talk with a dietitian to make an eating plan that  is right for you. ? If directed by your health care provider: ? Limit salt (sodium). Lowering your sodium intake may reduce symptoms of heart failure. Ask a dietitian to recommend heart-healthy seasonings. ? Limit your fluid intake. Fluid restriction may reduce symptoms of heart failure. ? Use low-fat cooking methods instead of frying. Low-fat methods include roasting, grilling, broiling, baking, poaching, steaming, and stir-frying. ? Choose foods that contain no trans fat and are low in saturated fat and cholesterol. Healthy choices include fresh or frozen fruits and vegetables, fish, lean meats,  legumes, fat-free or low-fat dairy products, and whole-grain or high-fiber foods.  Limit alcohol intake to no more than 1 drink a day for nonpregnant women and 2 drinks a day for men. One drink equals 12 oz of beer, 5 oz of wine, or 1 oz of hard liquor. ? Drinking more than that is harmful to your heart. Tell your health care provider if you drink alcohol several times a week. ? Talk with your health care provider about whether any level of alcohol use is safe for you. Activity  Ask your health care provider about attending cardiac rehabilitation. These programs include aerobic physical activity, which provides many benefits for your heart.  If no cardiac rehabilitation program is available, ask your health care provider what aerobic exercises are safe for you to do. Lifestyle Make the lifestyle changes recommended by your health care provider. In general:  Lose weight if your health care provider tells you to do that. Weight loss may reduce symptoms of heart failure.  Do not use any products that contain nicotine or tobacco, such as cigarettes or e-cigarettes. If you need help quitting, ask your health care provider.  Do not use street (illegal) drugs.  Return to your normal activities as told by your health care provider. Ask your health care provider what activities are safe for you.  General instructions  Make sure you weigh yourself every day to track your weight. Rapid weight gain may indicate an increase in fluid in your body and may increase the workload of your heart. ? Weigh yourself every morning. Do this after you urinate but before you eat breakfast. ? Wear the same type of clothing, without shoes, each time you weigh yourself. ? Weigh yourself on the same scale and in the same spot each time.  Living with chronic heart failure often leads to emotions such as fear, stress, anxiety, and depression. If you feel any of these emotions and need help coping, contact your health  care provider. Other ways to get help include: ? Talking to friends and family members about your condition. They can give you support and guidance. Explain your symptoms to them and, if comfortable, invite them to attend appointments or rehabilitation with you. ? Joining a support group for people with chronic heart failure. Talking with other people who have the same symptoms may give you new ways of coping with your disease and your emotions.  Stay up to date with your shots (vaccines). Staying current on pneumococcal and influenza vaccines is especially important in preventing germs from attacking your airways (respiratory infections).  Keep all follow-up visits as told by your health care provider. This is important. How to recognize changes in your condition You and your family members need to know what changes to watch for in your condition. Watch for the following changes and report them to your health care provider:  Sudden weight gain. Ask your health care provider what amount of weight gain  to report.  Shortness of breath: ? Feeling short of breath while at rest, with no exercise or activity that required great effort. ? Feeling breathless with activity.  Swelling of your lower legs or ankles.  Difficulty sleeping: ? You wake up feeling short of breath. ? You have to use more pillows to raise your head in order to sleep.  Frequent, dry, hacking cough.  Loss of appetite.  Feeling more tired all the time.  Depression or feelings of sadness or hopelessness.  Bloating in the stomach.  Where to find more information  Local support groups. Ask your health care provider about groups near you.  The American Heart Association: www.heart.org Contact a health care provider if:  You have a rapid weight gain.  You have increasing shortness of breath that is unusual for you.  You are unable to participate in your usual physical activities.  You tire easily.  You cough  more than normal, especially with physical activity.  You have any swelling or more swelling in areas such as your hands, feet, ankles, or abdomen.  You feel like your heart is beating quickly (palpitations).  You become dizzy or light-headed when you stand up. Get help right away if:  You have difficulty breathing.  You notice or your family notices a change in your awareness, such as having trouble staying awake or having difficulty with concentration.  You have pain or discomfort in your chest.  You have an episode of fainting (syncope). Summary  There is no cure for heart failure, so it is important for you to take good care of yourself and follow the treatment plan set by your health care provider.  Medicines are important in reducing your heart's workload, slowing the progression of heart failure, and improving your symptoms.  Living with chronic heart failure often leads to emotions such as fear, stress, anxiety, and depression. If you are feeling any of these emotions and need help coping, contact your health care provider. This information is not intended to replace advice given to you by your health care provider. Make sure you discuss any questions you have with your health care provider. Document Released: 04/06/2017 Document Revised: 04/06/2017 Document Reviewed: 04/06/2017 Elsevier Interactive Patient Education  2018 [place].   Preventing Heart Failure Heart failure is a condition in which the heart has trouble pumping blood. This may mean that the heart cannot pump enough blood out to the body, or that the heart does not fill up with enough blood. Either of those problems can lead to symptoms such as fatigue, trouble breathing, and swelling throughout the body. This is a common medical condition that affects not only the heart, but the entire body. Making certain nutrition and lifestyle changes can help you prevent heart failure and avoid serious health  problems. What nutrition changes can be made?  If you are overweight or obese, reduce how many calories you eat each day so that you lose weight. Work with your health care provider or a diet and nutrition specialist (dietitian) to determine how many calories you need each day.  Eat foods that are low in salt (sodium). Avoid adding extra salt to foods.  Eat a well-balanced diet that includes a lot of: ? Fresh fruits and vegetables. ? Whole grains. ? Lean meats. ? Beans. ? Fat-free or low-fat dairy products.  Avoid foods that contain a lot of: ? Trans fats. ? Saturated fats. ? Sugar. ? Cholesterol. What lifestyle changes can be made?  Do not  use any products that contain nicotine or tobacco, such as cigarettes and e-cigarettes. If you need help quitting or reducing how much you smoke, ask your health care provider.  Stop using alcohol, or limit alcohol intake to no more than 1 drink a day for nonpregnant women and 2 drinks a day for men. One drink equals 12 oz of beer, 5 oz of wine, or 1 oz of hard liquor.  Exercise for at least 150 minutes each week, or as much as told by your health care provider. ? Do moderate-intensity exercise, such as brisk walking, bicycling, or water aerobics. ? Ask your health care provider which activities are safe for you.  See a health care provider regularly for screening and wellness checks. Know your heart health indicators, such as: ? Blood pressure. ? Cholesterol levels. ? Blood sugar (glucose) levels. ? Weight and BMI.  If you have diabetes, manage your condition and follow your treatment plan as instructed.  Try to get 7-9 hours of sleep each night. To help with sleep: ? Keep your bedroom cool and dark. ? Do not eat a heavy meal during the hour before you go to bed. ? Do not drink alcohol or caffeinated drinks before bed. ? Avoid screen time before bedtime. This means avoiding television, computers, tablets, and cell phones.  Find ways to  relax and manage stress. These may include: ? Breathing exercises. ? Meditation. ? Yoga. ? Listening to music. Why are these changes important?  A well-balanced diet with the appropriate amount of calories can keep your body weight at a healthy level, which reduces strain on your heart.  A low-sodium diet can help keep your blood pressure in a normal range and keep your blood vessels working properly.  Quitting smoking and limiting alcohol intake can reduce harmful effects that these substances have on your heart and blood vessels.  Regular exercise can keep your heart strong so it can pump blood normally.  Managing diabetes helps your blood circulate and can help you maintain a healthy weight.  Managing stress helps to reduce the risk of high blood pressure and heart problems. What can happen if changes are not made? Heart failure can cause very serious problems that may get worse over time, such as:  Extreme fatigue during normal physical activities.  Shortness of breath or trouble breathing.  Swelling in your abdomen, legs, ankles, feet, or neck.  Fluid buildup throughout the body.  Weight gain.  Cough.  Frequent urination.  What can I do to lower my risk? You may be able to lower your risk of heart failure by:  Losing weight or keeping your weight under control.  Working with your health care provider to manage your: ? Cholesterol. ? Blood pressure. ? Diabetes, if this applies.  Eating a healthy diet.  Exercising regularly.  Avoiding unhealthy habits, such as smoking, drinking, or using drugs.  Getting plenty of sleep.  Managing your stress.  How is this treated? Heart failure cannot be cured except by heart transplant, but treatment can help to improve your quality of life. Treatment may include:  Medicines to help: ? Lower blood pressure. ? Remove excess sodium from your body. ? Relax blood vessels. ? Improve heart function. ? Control other  symptoms of heart failure.  Surgery to open blocked coronary arteries or repair damaged heart valves.  Implantation of a biventricular pacemaker to improve heart muscle function (cardiac resynchronization therapy). This device paces both the right ventricle and left ventricle.  Implantation of  a device to treat serious abnormal heart rhythms (implantable cardioverter defibrillator, ICD).  Implantation of a mechanical heart pump to improve the pumping ability of your heart (left ventricular assist device, LVAD).  Heart transplant. This treatment is considered for certain people who do not improve with other treatments.  Where to find more information:  National Heart, Lung, and Blood Institute: ClickDebate.gl  Centers for Disease Control and Prevention: LawyerNetworking.com.cy  [facility]: https://www.[place].info/  American Heart Association: ReligiousCamps.at.jsp Contact a health care provider if:  You have rapid weight gain.  You have increasing shortness of breath that is unusual for you.  You tire easily, or you are unable to participate in your usual activities.  You cough more than normal, especially with physical activity.  You have any swelling or more swelling in areas such as your hands, feet, ankles, or abdomen. Summary  Heart failure can be prevented by making changes to your diet and your lifestyle.  It is important to eat a healthy diet, manage your weight, exercise regularly, manage stress, avoid drugs and alcohol, and keep your cholesterol and blood pressure under control.  Heart failure can cause very serious problems over time. This information is not intended to replace advice given to you by your health care provider. Make sure you discuss any questions you have with your  health care provider. Document Released: 07/13/2016 Document Revised: 02/08/2017 Document Reviewed: 07/13/2016 Elsevier Interactive Patient Education  2018 [place] With Heart Failure Caregivers provide physical and emotional support to friends or family members with heart failure. If you are supporting someone who has heart failure, you play an important role in making sure that the person follows schedules, takes medicines, and follows the overall treatment and rehabilitation plan. What do I need to know about heart failure? Heart failure is a serious condition. It means that the heart cannot fill or pump enough blood and oxygen to support the body and its functions. This can cause damage to many parts of the body, including important organs.  The person with heart failure may:  Need to take medicines on a regular basis.  Need to have some procedures or surgery.  Require rehabilitation treatment.  Need to make changes in: ? Lifestyle. ? Diet. ? Physical activity and exercise.  Planning for discharge from the hospital Privacy laws require that your friend or family member give you permission to hear private health information before this information will be shared with you. Talk with the health care provider about these rules. Before your friend or family member leaves the hospital after treatment, make sure you understand:  The recovery process after heart failure.  Physical, emotional, behavior, and other changes that occur in the person with heart failure.  The medicines that the person with heart failure has to take and when to take them.  Whether you will need extra help at home.  The treatments for heart failure.  Changes you have to make at home to make the person safe.  How to lower the person's risk of having other heart problems.  Diet and exercise changes for the person with heart failure.  Whether the person with heart failure will  need help using the bathroom, bathing, eating, or doing other activities.  Whether you need to get certain devices or equipment for the person with heart failure.  That heart failure requires emergency medical care if symptoms appear.  Caring for yourself It is normal to have many different emotions while caring  for someone with heart failure. The lifestyle changes that come with this diagnosis can be stressful and difficult. As a caregiver, you need to:  Monitor yourself for signs of emotional, physical, and mental exhaustion (caregiver burnout). This can happen if you feel overwhelmed.  Take care of yourself so that you can better meet the needs of your loved one and also meet your own needs.  Signs of caregiver burnout  Sleep problems.  Difficulty concentrating.  Changes in appetite.  Depressed mood and mood swings.  Feeling emotionally out of control.  Feeling unmotivated to do anything.  Neglecting your loved one, including forgetting to give medicines or keep appointments. Ways to care for yourself  Find a local support group.  Ask friends and family for help.  Get counseling or therapy.  Ask a health care provider for help.  Take time for yourself.  Relax, exercise, and eat healthy. What support is available?  Ask your health care provider for help.  Think about taking classes to learn more about supporting someone with heart failure.  Take a class to become certified in CPR.  Think about joining a support group for people who are caring for someone with heart failure. It is possible to find such a group in your local community.  Search for information online from trusted sources such as the American Heart Association: www.heart.org Summary  Before you leave the hospital, make sure you understand how to care for someone with heart failure.  It is normal to have many different emotions while caring for someone with heart failure.  Take care of yourself  by asking for help and taking time to relax.  Monitor yourself for caregiver burnout and know when to ask for help. This information is not intended to replace advice given to you by your health care provider. Make sure you discuss any questions you have with your health care provider. Document Released: 04/05/2017 Document Revised: 04/05/2017 Document Reviewed: 04/05/2017 Elsevier Interactive Patient Education  2018 [employer].    Steps to Quit Smoking Smoking tobacco can be harmful to your health and can affect almost every organ in your body. Smoking puts you, and those around you, at risk for developing many serious chronic diseases. Quitting smoking is difficult, but it is one of the best things that you can do for your health. It is never too late to quit. What are the benefits of quitting smoking? When you quit smoking, you lower your risk of developing serious diseases and conditions, such as:  Lung cancer or lung disease, such as COPD.  Heart disease.  Stroke.  Heart attack.  Infertility.  Osteoporosis and bone fractures.  Additionally, symptoms such as coughing, wheezing, and shortness of breath may get better when you quit. You may also find that you get sick less often because your body is stronger at fighting off colds and infections. If you are pregnant, quitting smoking can help to reduce your chances of having a baby of low birth weight. How do I get ready to quit? When you decide to quit smoking, create a plan to make sure that you are successful. Before you quit:  Pick a date to quit. Set a date within the next two weeks to give you time to prepare.  Write down the reasons why you are quitting. Keep this list in places where you will see it often, such as on your bathroom mirror or in your car or wallet.  Identify the people, places, things, and activities that  make you want to smoke (triggers) and avoid them. Make sure to take these actions: ? Throw away all  cigarettes at home, at work, and in your car. ? Throw away smoking accessories, such as Scientist, research (medical). ? Clean your car and make sure to empty the ashtray. ? Clean your home, including curtains and carpets.  Tell your family, friends, and coworkers that you are quitting. Support from your loved ones can make quitting easier.  Talk with your health care provider about your options for quitting smoking.  Find out what treatment options are covered by your health insurance.  What strategies can I use to quit smoking? Talk with your healthcare provider about different strategies to quit smoking. Some strategies include:  Quitting smoking altogether instead of gradually lessening how much you smoke over a period of time. Research shows that quitting cold [place] is more successful than gradually quitting.  Attending in-person counseling to help you build problem-solving skills. You are more likely to have success in quitting if you attend several counseling sessions. Even short sessions of 10 minutes can be effective.  Finding resources and support systems that can help you to quit smoking and remain smoke-free after you quit. These resources are most helpful when you use them often. They can include: ? Online chats with a Social worker. ? Telephone quitlines. ? Careers information officer. ? Support groups or group counseling. ? Text messaging programs. ? Mobile phone applications.  Taking medicines to help you quit smoking. (If you are pregnant or breastfeeding, talk with your health care provider first.) Some medicines contain nicotine and some do not. Both types of medicines help with cravings, but the medicines that include nicotine help to relieve withdrawal symptoms. Your health care provider may recommend: ? Nicotine patches, gum, or lozenges. ? Nicotine inhalers or sprays. ? Non-nicotine medicine that is taken by mouth.  Talk with your health care provider about combining  strategies, such as taking medicines while you are also receiving in-person counseling. Using these two strategies together makes you more likely to succeed in quitting than if you used either strategy on its own. If you are pregnant or breastfeeding, talk with your health care provider about finding counseling or other support strategies to quit smoking. Do not take medicine to help you quit smoking unless told to do so by your health care provider. What things can I do to make it easier to quit? Quitting smoking might feel overwhelming at first, but there is a lot that you can do to make it easier. Take these important actions:  Reach out to your family and friends and ask that they support and encourage you during this time. Call telephone quitlines, reach out to support groups, or work with a counselor for support.  Ask people who smoke to avoid smoking around you.  Avoid places that trigger you to smoke, such as bars, parties, or smoke-break areas at work.  Spend time around people who do not smoke.  Lessen stress in your life, because stress can be a smoking trigger for some people. To lessen stress, try: ? Exercising regularly. ? Deep-breathing exercises. ? Yoga. ? Meditating. ? Performing a body scan. This involves closing your eyes, scanning your body from head to toe, and noticing which parts of your body are particularly tense. Purposefully relax the muscles in those areas.  Download or purchase mobile phone or tablet apps (applications) that can help you stick to your quit plan by providing reminders, tips, and encouragement. There  are many free apps, such as QuitGuide from the [employer] Office manager for Disease Control and Prevention). You can find other support for quitting smoking (smoking cessation) through smokefree.gov and other websites.  How will I feel when I quit smoking? Within the first 24 hours of quitting smoking, you may start to feel some withdrawal symptoms. These symptoms  are usually most noticeable 2-3 days after quitting, but they usually do not last beyond 2-3 weeks. Changes or symptoms that you might experience include:  Mood swings.  Restlessness, anxiety, or irritation.  Difficulty concentrating.  Dizziness.  Strong cravings for sugary foods in addition to nicotine.  Mild weight gain.  Constipation.  Nausea.  Coughing or a sore throat.  Changes in how your medicines work in your body.  A depressed mood.  Difficulty sleeping (insomnia).  After the first 2-3 weeks of quitting, you may start to notice more positive results, such as:  Improved sense of smell and taste.  Decreased coughing and sore throat.  Slower heart rate.  Lower blood pressure.  Clearer skin.  The ability to breathe more easily.  Fewer sick days.  Quitting smoking is very challenging for most people. Do not get discouraged if you are not successful the first time. Some people need to make many attempts to quit before they achieve long-term success. Do your best to stick to your quit plan, and talk with your health care provider if you have any questions or concerns. This information is not intended to replace advice given to you by your health care provider. Make sure you discuss any questions you have with your health care provider. Document Released: 11/16/2001 Document Revised: 07/20/2016 Document Reviewed: 04/08/2015 Elsevier Interactive Patient Education  2018 [employer] with Quitting Smoking Quitting smoking is a physical and mental challenge. You will face cravings, withdrawal symptoms, and temptation. Before quitting, work with your health care provider to make a plan that can help you cope. Preparation can help you quit and keep you from giving in. How can I cope with cravings? Cravings usually last for 5-10 minutes. If you get through it, the craving will pass. Consider taking the following actions to help you cope with cravings:  Keep  your mouth busy: ? Chew sugar-free gum. ? Suck on hard candies or a straw. ? Brush your teeth.  Keep your hands and body busy: ? Immediately change to a different activity when you feel a craving. ? Squeeze or play with a ball. ? Do an activity or a hobby, like making bead jewelry, practicing needlepoint, or working with wood. ? Mix up your normal routine. ? Take a short exercise break. Go for a quick walk or run up and down stairs. ? Spend time in public places where smoking is not allowed.  Focus on doing something kind or helpful for someone else.  Call a friend or family member to talk during a craving.  Join a support group.  Call a quit line, such as 1-800-QUIT-NOW.  Talk with your health care provider about medicines that might help you cope with cravings and make quitting easier for you.  How can I deal with withdrawal symptoms? Your body may experience negative effects as it tries to get used to not having nicotine in the system. These effects are called withdrawal symptoms. They may include:  Feeling hungrier than normal.  Trouble concentrating.  Irritability.  Trouble sleeping.  Feeling depressed.  Restlessness and agitation.  Craving a cigarette.  To manage withdrawal symptoms:  Avoid places, people, and activities that trigger your cravings.  Remember why you want to quit.  Get plenty of sleep.  Avoid coffee and other caffeinated drinks. These may worsen some of your symptoms.  How can I handle social situations? Social situations can be difficult when you are quitting smoking, especially in the first few weeks. To manage this, you can:  Avoid parties, bars, and other social situations where people might be smoking.  Avoid alcohol.  Leave right away if you have the urge to smoke.  Explain to your family and friends that you are quitting smoking. Ask for understanding and support.  Plan activities with friends or family where smoking is not an  option.  What are some ways I can cope with stress? Wanting to smoke may cause stress, and stress can make you want to smoke. Find ways to manage your stress. Relaxation techniques can help. For example:  Breathe slowly and deeply, in through your nose and out through your mouth.  Listen to soothing, relaxing music.  Talk with a family member or friend about your stress.  Light a candle.  Soak in a bath or take a shower.  Think about a peaceful place.  What are some ways I can prevent weight gain? Be aware that many people gain weight after they quit smoking. However, not everyone does. To keep from gaining weight, have a plan in place before you quit and stick to the plan after you quit. Your plan should include:  Having healthy snacks. When you have a craving, it may help to: ? Eat plain popcorn, crunchy carrots, celery, or other cut vegetables. ? Chew sugar-free gum.  Changing how you eat: ? Eat small portion sizes at meals. ? Eat 4-6 small meals throughout the day instead of 1-2 large meals a day. ? Be mindful when you eat. Do not watch television or do other things that might distract you as you eat.  Exercising regularly: ? Make time to exercise each day. If you do not have time for a long workout, do short bouts of exercise for 5-10 minutes several times a day. ? Do some form of strengthening exercise, like weight lifting, and some form of aerobic exercise, like running or swimming.  Drinking plenty of water or other low-calorie or no-calorie drinks. Drink 6-8 glasses of water daily, or as much as instructed by your health care provider.  Summary  Quitting smoking is a physical and mental challenge. You will face cravings, withdrawal symptoms, and temptation to smoke again. Preparation can help you as you go through these challenges.  You can cope with cravings by keeping your mouth busy (such as by chewing gum), keeping your body and hands busy, and making calls to  family, friends, or a helpline for people who want to quit smoking.  You can cope with withdrawal symptoms by avoiding places where people smoke, avoiding drinks with caffeine, and getting plenty of rest.  Ask your health care provider about the different ways to prevent weight gain, avoid stress, and handle social situations. This information is not intended to replace advice given to you by your health care provider. Make sure you discuss any questions you have with your health care provider. Document Released: 11/19/2016 Document Revised: 11/19/2016 Document Reviewed: 11/19/2016 Elsevier Interactive Patient Education  [employer].

## 2018-05-30 NOTE — Care Management Note (Signed)
Case Management Note  Patient Details  Name: Shaun May MRN: [id number] Date of Birth: 04-12-1951   Subjective/Objective:      Admitted with CHF. Pt from home, lives with spouse, ind with ADL's, work's part time. Pt has PCP, transportation and insurance with drug coverage. Pt has home oxygen set up for cont use and is asking about getting a port concentrator. Pt reports compliance with meds, diet and daily weights.               Action/Plan: Pt would have to switch DME providers to get a POC. He would like to switch to [facility]. CM has asked [facility] rep when pt's billing cycle ends so that he may switch providers. Rep says they will POC's available July 1st and will reach out to patient. Pt aware if he changes DME provider he will have have his PCP or pulmonologist make referral.  CM has made referral for [facility] for CHF.   Expected Discharge Date:  05/30/18               Expected Discharge Plan:  [place]  In-House Referral:  NA  Discharge planning Services  CM Consult  Post Acute Care Choice:  NA Choice offered to:  Patient  Status of Service:  Completed, signed off  Sherald Barge, RN 05/30/2018, 12:05 PM

## 2018-05-30 NOTE — Progress Notes (Signed)
Pt was in the restroom on RA. Checked SPO2 when patient returned to bed. He was 96% on RA after walking. O2 left off of patient at this time.

## 2018-05-30 NOTE — Plan of Care (Signed)
progressing 

## 2018-06-02 ENCOUNTER — Other Ambulatory Visit: Payer: Self-pay

## 2018-06-02 NOTE — Patient Outreach (Signed)
[place] [facility]) Care Management  06/02/2018  Shaun May September 28, 1951 [id number]   EMMI- Heart Failure RED ON EMMI ALERT Day # 1 Date:  06/01/18 Red Alert Reason:  Scheduled follow up appointment? no  Outreach attempt # 1 Spoke with patient he is able to verify HIPAA.  Addressed red flag with patient.  He states he will be calling today for follow up appointment.  Discussed with patient heart failure and how to monitor.  Patient states he is going to have to get used to his new diet and knowing heart failure.  Discussed with patient [facility] services and how we could support him. Patient is agreeable to services.    Social: Patient lives in the home with girlfriend and they help each other.  Patient is independent with all aspects of care.   Conditions: Patient admits to heart failure he states he was diagnosed about 2 years ago.  Patient sees Dr. Bronson Ing.  Patient does weigh daily but states he will be getting him another scale. Patent also has COPD, A. Fibrillation, Hyperlipidemia, and GERD.  Medications: Patient has all his medications and offers no questions or concerns.       Plan: RN CM will refer to health coach for disease management and support of his heart failure.     Jone Baseman, RN, MSN [facility] Care Management Care Management Coordinator Direct Line [phone] Toll Free: [phone]  Fax: [phone]

## 2018-06-06 ENCOUNTER — Other Ambulatory Visit: Payer: Self-pay

## 2018-06-06 NOTE — Patient Outreach (Addendum)
[place] [facility]) Care Management  [place]  06/06/2018   Shaun May [birth year] [id number]   EMMI- Heart Failure RED ON EMMI ALERT Day # 3 Date: 06/05/2018 Red Alert Reason: New/worsening problems? Yes  New Swelling? Yes  New /worsening shortness of breath? Yes  Outreach attempt # 1 to the patient.  HIPAA verified.  I discussed with the patient about his red flag answers.  The patient states that he pushed the wrong button on the call.  He denies having any swelling, new/worsening issues and new/worsening shortness of breath.  He states that he is still having shortness of breath.  I advised the patient to call and discuss his issues with his physician.  We also discussed medication adherence and signs and symptoms of heart failure. He verbalized understanding. He states that he has an appointment with Dr [place] Crane and the end of this month and an appointment with Dr Bronson Ing on the 17 th of this month.  Plan: RN will follow up with the patient at the next scheduled interval.   [place] [facility]) Care Management  Subjective: Telephone call to the patient for initial assessment.  HIPAA verified.  The patient lives in the home with his girlfriend.  He states that he is independent with his ADLS/IADLS.  He has transportation to his appointments.  The patient states that he has a new digital scale, blood pressure machine, oxygen concentrator and wheelchair in the home.   He states that he weighed this morning and his weight was 197.  He states that he is adherent with his medications.    He is trying to follow a heart healthy and low sodium diet although he is not happy with it.  He states that he was able to see a nutritionist while in the hospital.  The patient does not have a health care power of attorney at this time and declined any information.   Encounter Medications:  Outpatient Encounter Medications as of 06/06/2018  Medication Sig Note  . albuterol  (PROVENTIL HFA;VENTOLIN HFA) 108 (90 Base) MCG/ACT inhaler Inhale 1-2 puffs into the lungs every 6 (six) hours as needed for wheezing or shortness of breath.   Marland Kitchen aspirin EC 81 MG tablet Take 81 mg by mouth daily.   Marland Kitchen atorvastatin (LIPITOR) 40 MG tablet TAKE ONE TABLET BY MOUTH ONCE DAILY AT 6PM.   . carvedilol (COREG) 25 MG tablet Take 25 mg by mouth 2 (two) times daily with a meal.    . Dextromethorphan-Guaifenesin (MUCINEX DM MAXIMUM STRENGTH) 60-1200 MG TB12 Take 1 tablet by mouth daily.   Marland Kitchen LORazepam (ATIVAN) 2 MG tablet Take 2 mg by mouth 2 (two) times daily.    . Magnesium 400 MG TABS Take 400 mg by mouth daily.   Marland Kitchen oxyCODONE-acetaminophen (PERCOCET/ROXICET) 5-325 MG tablet Take 1 tablet by mouth 2 (two) times daily. *May take one tablet three times daily as needed for pain 01/24/2018: Patient states that he only takes 2 per day.  . OXYGEN Inhale 2 L into the lungs at bedtime. **2L with sleep and exertion if needed   . pantoprazole (PROTONIX) 40 MG tablet TAKE 1 TABLET BY MOUTH DAILY BEFORE BREAKFAST.   Marland Kitchen Phenyleph-Doxylamine-DM-APAP (NYQUIL SEVERE COLD/FLU PO) Take 1 Dose by mouth at bedtime.   . potassium chloride SA (K-DUR,KLOR-CON) 20 MEQ tablet Take 1 tablet (20 mEq total) by mouth 2 (two) times daily.   Marland Kitchen rOPINIRole (REQUIP) 0.25 MG tablet Take 0.25-0.75 mg by mouth at bedtime.    Marland Kitchen  sodium chloride (OCEAN) 0.65 % SOLN nasal spray Place 1 spray into both nostrils as needed for congestion.   . torsemide (DEMADEX) 20 MG tablet Take 1 tablet (20 mg total) by mouth 2 (two) times daily.   Marland Kitchen umeclidinium-vilanterol (ANORO ELLIPTA) 62.5-25 MCG/INH AEPB Inhale 1 puff into the lungs daily.   Marland Kitchen ibuprofen (ADVIL,MOTRIN) 800 MG tablet Take 1 tablet (800 mg total) by mouth 3 (three) times daily. (Patient not taking: Reported on 06/06/2018)    No facility-administered encounter medications on file as of 06/06/2018.     Functional Status:  In your present state of health, do you have any difficulty  performing the following activities: 05/27/2018 05/27/2018  Hearing? N N  Vision? N N  Difficulty concentrating or making decisions? N N  Walking or climbing stairs? Y N  Dressing or bathing? N N  Doing errands, shopping? N -  Some recent data might be hidden    Fall/Depression Screening: Fall Risk  06/06/2018 05/23/2018 03/07/2018  Falls in the past year? No No No   PHQ 2/9 Scores 06/06/2018 06/02/2018 05/23/2018 03/07/2018 01/06/2018  PHQ - 2 Score 2 0 0 0 0  PHQ- 9 Score 7 - - - -    Assessment: Patient will continue to benefit from health coach outreach for disease management and support.  THN CM Care Plan Problem One     Most Recent Value  Care Plan Problem One  Knowledge deficit related to diease manegment  Role Documenting the Problem One  [place] for Problem One  Active  THN Long Term Goal   IN 90 days the patient will verbalize that he has remained hospital free  [place] Term Goal Start Date  06/06/18  Interventions for Problem One Long Term Goal  Reviewed signs and symptoms of heart failure, discussed with patient, encouraged patient to weigh daily, verified with patient correct dose of fluid pills he should be taking from his last appointment, encouraged medication compliance  THN CM Short Term Goal #1   In 30 days the patient will verbalize that he is weighing daily and recording his values  THN CM Short Term Goal #1 Start Date  06/06/18  Interventions for Short Term Goal #1  talked with the patient about weighing daily and how it will help with his CHF  THN CM Short Term Goal #2   In 30 days the patient will verbalize reducing his cigarrette smoking from 2 packs a day to 1 1/2  THN CM Short Term Goal #2 Start Date  06/06/18  Interventions for Short Term Goal #2  discussed with the patient about smoking cessation and how it can help with his copd and chf      Plan: [place] will provide ongoing education for patient on heart failure through phone calls and  sending printed information to patient for further discussion.  RN Health Coach will send welcome packet with consent to patient as well as printed information on heart failure.  RN Health Coach will send initial barriers letter, assessment, and care plan to primary care physician.  Lazaro Arms RN, BSN, [place] Direct Dial:  [phone]  Fax: [phone]

## 2018-06-07 ENCOUNTER — Other Ambulatory Visit: Payer: Self-pay

## 2018-06-07 NOTE — Patient Outreach (Signed)
[place] [facility]) Care Management  06/07/2018  ZAKARIAH URWIN 10-14-1951 [id number]   EMMI- Heart Failure Red on EMMI Alert: Day # 6 Date: 06/06/2018 Red Alert Reason: New/worsening problems? Yes, New/worsening shortness of breath? Yes Sad, hopeless, or empty? Lost interest in things they used to enjoy? Yes   Outreach attempt #  to patient.  HIPAA verified. Addressed red flag answers.  The patient states the when he answers the questions he is pressing the wrong button.  He denies any new/worsening symptoms, new/worsening shortness of breath,  swelling or chest pain. The patient weighed today his weight was 198.6   He does states that he is sad that he is having to change his diet and remove all of the foods that he enjoys.  I explained to the patient that changing his food intake will help with improving his health and he will still be able to enjoy his food with modifications.  The patient states that he was getting ready to use his inhaler and has his nebulizer if he needs it.  He is also concerned about getting a mobile oxygen unit .  I discussed with the patient that he has a concentrator in the home if needed for home use and asked him to call his provider to notify them of his concerns. I also discussed with the patient that he will receive more calls.  If any of the responses come back abnormal that I will cal and check in with him again.  The patient verbalized understanding and thanked me for the call.   Plan: RN Health Coach will follow up with the patient at the next scheduled interval.  Lazaro Arms RN, BSN, [place]:  [phone] Fax: [phone]

## 2018-06-09 ENCOUNTER — Other Ambulatory Visit: Payer: Self-pay

## 2018-06-09 NOTE — Patient Outreach (Signed)
[place] [facility]) Care Management  06/09/2018  Shaun May Sep 14, 1951 [id number]     EMMI- Heart Failure Red on EMMI Alert: Day # 7 & 8 Date: 06/09/2018 Red Alert Reason: New/worsening problems? Yes, New/worsening shortness of breath? Yes Went to follow up appointment? No  Outreach attempt # 1 to patient.  HIPAA verified. The patient states that he is not having any new/worsening symptoms.  His shortness of breath is about the same when he was in the hospital. He states that he has been using his inhaler but not much with the nebulizer.  He used the nebulizer yesterday but he could not tell much of a difference.  He also stated that he has not called his physician with his problem.  I advised him to let his physician know how he is feeling.  We went over the CHF action plan, salt intake and adherence with his medications.  He denies any chest pain or swelling.  He stated that he weighed today and is staying around his norm.  We talked about smoking cessation and how that can help with his breathing.  He verbalized understanding.  I told the patient that I will follow up with him again if any of his responses to his calls are abnormal and he said that would be fine and thanked me for calling.   Plan:  RN Health Coach will follow up with the patient at the next scheduled interval.  Lazaro Arms RN, BSN, [place]:  [phone]  Fax: [phone]

## 2018-06-12 ENCOUNTER — Telehealth: Payer: Self-pay | Admitting: *Deleted

## 2018-06-12 ENCOUNTER — Telehealth: Payer: Self-pay | Admitting: Cardiovascular Disease

## 2018-06-12 ENCOUNTER — Other Ambulatory Visit: Payer: Self-pay

## 2018-06-12 NOTE — Telephone Encounter (Signed)
Called to verify medications with RN and computer was down.  Patient will be contacted directly.

## 2018-06-12 NOTE — Telephone Encounter (Signed)
Pt has been gaining a pound a day for the past 3 days and is having SOB

## 2018-06-12 NOTE — Telephone Encounter (Signed)
Note fwd to [facility] triage as this is [place] patient.

## 2018-06-12 NOTE — Patient Outreach (Signed)
[place] [facility]) Care Management  06/12/2018  Shaun May 26-Dec-1950 [id number]    EMMI-Heart Failure Red on EMMI Alert: Day #9 , 10, 11 Date:06/12/2018 Red Alert Reason:New/worsening problems? Yes, New/worsening shortness of breath? Yes  Know why to take meds? No   Smoker?  Yes  Outreach attempt # 1 to patient.  HIPAA verified.  Questions addressed with the patient.  He states that he is not having any New/Worsening symptoms.  He denies chest pain, swelling or weakness.  He does state that he is unable to walk in two rooms of his house and then becomes short of breath.  He states that he is taking his medication as prescribed.  He used his inhaler this morning and uses his nebulizer once daily.  He states that he did not know which medications that he takes to help with his heart failure.  We discussed his medications and what he is taking them for.  The patient verbalized understanding. The patient states that his weight today was 200 lbs which has increased by a pound a day  in the last three days.  I asked the patient has he called his physician office and he stated "no".  I reiterated to the patient the importance of informing the doctor's office.  I went over the heart failure action plan with the patient. The patient verbalized understanding.   The patient states that he has an appointment with his physician on the 17 th of this month.  I expressed to him that I felt it important for him to inform his doctor of his situation.  He stated that he would call.  I told him that I would send a note as well.    I call Dr Court Joy office and gave information to [place] who would forward the message.    Plan:  RN Health Coach will follow up with the patient at the next scheduled interval.  Lazaro Arms RN, BSN, [place]: [phone]  [phone]

## 2018-06-12 NOTE — Telephone Encounter (Signed)
D/C 05/30/2018 - nurse concerned - more SOB with walking within his house.  198 - 199 - 200 - weight trending upward.  Legs swelling like in the hospital - is keeping them elevated.  Nurse has advised patient to call, but he has not yet. Does have upcoming OV scheduled on 06/21/2018 with Melina Copa, PA.

## 2018-06-13 ENCOUNTER — Other Ambulatory Visit: Payer: Self-pay

## 2018-06-13 NOTE — Patient Outreach (Signed)
[place] [facility]) Care Management  06/13/2018  Shaun May Dec 01, 1951 [id number]   EMMI- Heart Failure  Red on EMMI Alert: Day # 12 Date:06/13/2018 Red Alert Reason: Any new problems? Yes  New/Worsening problems? Yes  New /worsening Shortness of Breath Yes  Lightheaded or dizzy? Yes  Outreach attempt # 1 to patient for assessment.  1st call to [phone] a male answered the phone and stated that the patient was not at home.  She gave me another number to dial [phone].  The number was called with no answer a HIPAA compliant voicemail was left with contact information.    Plan: RN Health Coach will send letter. RN Health Coach will make another attempt to the patient within four business days.  Lazaro Arms RN, BSN, [place] Direct Dial:  [phone] Fax: [phone]

## 2018-06-14 ENCOUNTER — Other Ambulatory Visit: Payer: Self-pay

## 2018-06-14 NOTE — Telephone Encounter (Signed)
Called pt., no answer, no voicemail.

## 2018-06-14 NOTE — Patient Outreach (Signed)
[facility]) Care Management  06/14/2018  CLYDELL SPOSITO [birth year] [id number]    EMMI- Heart Failure  Red on EMMI Alert: Day # 12, 13 Date:06/14/2018 Red Alert Reason: Any new problems? Yes  New/Worsening problems? Yes  New /worsening Shortness of Breath Yes  Lightheaded or dizzy? Yes Smoker? Yes  Outreach attempt # 2 to patient for assessment.  First call made to the [phone] a male answered the phone and stated he was not there.  The next call placed to [phone] no answer unable to leave a message due to no answering machine pickup.  Plan:  RN Health Coach will make another attempt to the patient within four business days.  Lazaro Arms RN, BSN, [place] Direct Dial: [phone]

## 2018-06-15 ENCOUNTER — Other Ambulatory Visit: Payer: Self-pay

## 2018-06-15 ENCOUNTER — Ambulatory Visit: Payer: Self-pay

## 2018-06-15 NOTE — Patient Outreach (Signed)
[facility]) Care Management  06/15/2018  MALIKAI GUT [birth year] [id number]   EMMI-Heart Failure Red on EMMI Alert: Day #12, 13, 14 Date:06/14/2018 Red Alert Reason:Any new problems? Yes New/Worsening problems? Yes New /worsening Shortness of Breath Yes Lightheaded or dizzy? YesSmoker? Yes  Patient returned call for follow up on EMMI.  HIPAA verified.  All EMMI questions were addressed.  The patient denies any new/worsening problems, chest pain, swelling , lightheaded or dizzy. He states that he still is having shortness of breath.  He states that he is using his inhaler and his nebulizer once a day.  He states that he is being adherent with all of his medications.  He verbalized that he did call the physician office as I had advised but has not had a return call yet. I reiterated the CHF action plan and discussed smoking cessation.  The patient verbalized understanding.  The patient has an appointment scheduled for the 17 th of this month and I encouraged him to keep his appointment.  Plan:  RN Health Coach will follow up with the patient at the next scheduled interval.  Lazaro Arms RN, BSN, [place]:  [phone]  Fax: [phone]

## 2018-06-15 NOTE — Patient Outreach (Signed)
[facility]) Care Management  06/15/2018  SYDNEY AZURE [birth year] [id number]   EMMI-Heart Failure Red on EMMI Alert: Day #12, 13, 14 Date:06/14/2018 Red Alert Reason:Any new problems? Yes New/Worsening problems? Yes New /worsening Shortness of Breath Yes Lightheaded or dizzy? Yes Smoker? Yes  Outreach attempt #3to patient for assessment. RN Health Coach received message that the patient asked for a return call. First call made to the [phone] a male answered the phone and stated he was not there.  The next call placed to [phone]  [facility]) no answer unable to leave a message due to no answering machine pickup.  Plan:  RN Health Coach will make another attempt to the patient within four business days. If no response to calls and letter in ten business days. Rn health Coach will proceed with case closure.   Lazaro Arms RN, BSN, [place] Direct Dial:  [phone]  Fax: [phone]

## 2018-06-19 ENCOUNTER — Other Ambulatory Visit: Payer: Self-pay

## 2018-06-19 NOTE — Patient Outreach (Signed)
[facility]) Care Management  06/19/2018  Shaun May 03-Oct-1951 [id number]   EMMI-Heart Failure Red on EMMI Alert: Day #16, 18 Date:06/19/2018 Red Alert Reason:Any new problems? Yes New/Worsening problems? Yes New /worsening Shortness of Breath Yes Other Symptoms/problems? Yes  Outreach attempt #1 to patient for assessment. No answer. Unable to leave the patient a message due to no answering machine pick up.  Plan: RN Health Coach will send letter. RN Health Coach will make another attempt to the patient within four business days.  Lazaro Arms RN, BSN, [place] Direct Dial:  [phone]  Fax: [phone]

## 2018-06-20 ENCOUNTER — Ambulatory Visit: Payer: Self-pay

## 2018-06-20 ENCOUNTER — Encounter: Payer: Self-pay | Admitting: Physician Assistant

## 2018-06-20 ENCOUNTER — Other Ambulatory Visit: Payer: Self-pay

## 2018-06-20 NOTE — Patient Outreach (Signed)
[facility]) Care Management  06/20/2018  Shaun May [birth year] [id number]   EMMI-Heart Failure Red on EMMI Alert: Day #16, 18, 19 Date:06/20/2018 Red Alert Reason:Any new problems? Yes New/Worsening problems? Yes New /worsening Shortness of Breath Yes Other Symptoms/problems? Yes Had diarrhea or felt sick to stomach? Yes Nausea or Vomiting? Yes  Outreach attempt # 2 to the patient.  HIPAA verified.  All issues were address.  The patient denies having any chest pain.  The patient states that he is still having shortness of breath.  He had some diarrhea over the weekend but has not had any since.  He states that he does not have any nausea or vomiting.   Heart Failure action plan red zone was addressed with the patient. He states that he is taking all of his medications, using his inhaler and using his nebulizer once daily. His weight today was 197.  He states that he fluctuates between 197 to 199.  He said his legs have begun to have red spot above his ankles.  He has noticed it is hard to breath in the heat.  I advised the patient about the heat and trying to stay in a cool place and staying hydrated.  The patient verbalized understanding.  The patient has a cardiology appointment at 130 tomorrow and I encouraged him to keep it and address his issues with the physician.  He stated that he would.  I let the patient know that if I receive any abnormal responses to his EMMI calls that I will call him.  He stated that he appreciate the calls.  Plan: RN Health Coach will call the patient at the next scheduled interval.  Lazaro Arms RN, BSN, [place] Direct Dial:  [phone]  Fax: [phone]

## 2018-06-20 NOTE — Progress Notes (Signed)
Cardiology Office Note    Date:  06/21/2018  ID:  Shaun May, DOB [birth year], MRN [id number] PCP:  Celene Squibb, MD  Cardiologist:  Kate Sable, MD  Chief Complaint: f/u SOB  History of Present Illness:  Shaun May is a 67 y.o. 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   Per cardiology notes, he has a history of pericardial effusion followed by prior echos. In 06/2016 EF was 45-50% with trivial effusion. He has a history of paroxysmal atrial arrhythmias. Dr. Tanna Furry note calls this prior atrial fib, and Dr. Court Joy note states prior atrial flutter. A discharge summary in 03/2016 indicated atrial flutter. He was placed on Xarelto but then developed a GI bleed and required a 5 unit blood transfusion for Hgb of 4.2. At that time, he had a gastric ulcer which has since healed on subsequent evaluations. In 01/2017 he saw Dr. Rayann Heman to discuss [facility]. He was placed on a re-trial of anticoagulation with Eliquis, then developed bleeding with urination so he has declined further anticoagulation since that time. In 03/2016 he underwent cath for EF 45% showing no evidence of obstructive CAD. In early 2019 he was diagnosed with lung CA and is undergoing XRT as he was not thought to be a candidate for surgical resection. PET scan 11/2017 showed no evidence of metastatic disease. On 05/17/18 he was seen in the ER with diagnosis of radiation pneumonitis. There was also concern for worsening HF. CT angio ruled out PE but showed small amount of pericardial  fluid with some enhancement, coronary artery calcifications, COPD and evolving changes compatible with radiation pneumonitis. At visit the next day with Dr. Bronson Ing 05/18/18,  Lasix was increased for bilateral lower extemity edema. Per phone notes, this was changed to torsemide given ineffective diuresis. 2D Echo 05/26/18 showed EF 60-65%, grade 2 DD, high vent filling pressure, mildly reduced RV function, small-moderate sized pericardial effusion without tamponade. On same day, he was admitted to the hospital with worsening SOB, weight gain, LEE, and abdominal distention. He was treated with IV Lasix with discharge weight not listed but reported to be down 12lb since admission (which would be about 202 at DC). He had mild AKI which improved with diuresis. Last labs 05/30/18 - Na 134, K 4.0, Cr 1.20, Mg 1.9 (as low as 1.0), Hgb 9.5 (was 13.6 in 12/2017, then 10.9->10.5->10.3->9.5), BNP 380, troponin neg. TSH 2017 wnl. No recent lipids on file.  He presents for post-hospital follow-up. He reports his significant dyspnea on exertion remains as bad as it was both before and after he left the hospital. He does not personally feel diuresis improved his SOB. It did improve his edema but he remains with 1+ LEE. He continues to smoke. No chest pain, orthopnea, syncope, or bleeding reported.   Past Medical History:  Diagnosis Date  . Alcohol use   . Anemia    when on Xarelto  . Cancer ([place])   . Chronic combined systolic and diastolic CHF (congestive heart failure) ([place])   . CKD (chronic kidney disease), stage II   . COPD (chronic obstructive pulmonary disease) (  HCC)    stage 4 COPD  . Dyspnea   . Gastric ulcer   . GI bleeding   . Hematuria   . Hemochromatosis   . Hypertension   . Lung nodule   . Mobitz type 2 second degree heart block    PPM MDT 10/28/15 Dr. Lovena Le  . NICM (nonischemic cardiomyopathy) ([place])   . Paroxysmal atrial flutter ([place])   . Pericardial effusion   . Persistent atrial  fibrillation ([place])   . Presence of permanent cardiac pacemaker    Medtronic  . Tobacco abuse     Past Surgical History:  Procedure Laterality Date  . BIOPSY  10/13/2016   Procedure: BIOPSY;  Surgeon: Rogene Houston, MD;  [facility];  Service: Endoscopy;;  esophageal  . CARDIAC CATHETERIZATION N/A 03/31/2016   Procedure: Left Heart Cath and Coronary Angiography;  Surgeon: Wellington Hampshire, MD;  Location: [place] [facility];  Service: Cardiovascular;  Laterality: N/A;  . COLONOSCOPY N/A 10/13/2016   Procedure: COLONOSCOPY;  Surgeon: Rogene Houston, MD;  [facility];  Service: Endoscopy;  Laterality: N/A;  . EP IMPLANTABLE DEVICE N/A 10/28/2015   Procedure: Pacemaker Implant;  Surgeon: Evans Lance, MD;  Location: [place] [facility];  Service: Cardiovascular;  Laterality: N/A;  . EP IMPLANTABLE DEVICE N/A 11/07/2015   Procedure: Pocket Revision;  Surgeon: Evans Lance, MD;  Location: [place] [facility];  Service: Cardiovascular;  Laterality: N/A;  . ESOPHAGOGASTRODUODENOSCOPY N/A 05/27/2016   Procedure: ESOPHAGOGASTRODUODENOSCOPY (EGD);  Surgeon: Rogene Houston, MD;  [facility];  Service: Endoscopy;  Laterality: N/A;  . ESOPHAGOGASTRODUODENOSCOPY N/A 10/13/2016   Procedure: ESOPHAGOGASTRODUODENOSCOPY (EGD);  Surgeon: Rogene Houston, MD;  [facility];  Service: Endoscopy;  Laterality: N/A;  1:30 - moved to 11/8 @ 1:00 - Ann notified pt  . FUDUCIAL PLACEMENT Right 12/29/2017   Procedure: PLACEMENT OF FUDUCIAL;  Surgeon: [name], MD;  Location: [place];  Service: Thoracic;  Laterality: Right;  . POLYPECTOMY  10/13/2016   Procedure: POLYPECTOMY;  Surgeon: Rogene Houston, MD;  [facility];  Service: Endoscopy;;  transverse colon polypectomy  . TONSILLECTOMY    . VIDEO BRONCHOSCOPY WITH ENDOBRONCHIAL NAVIGATION Right 12/29/2017   Procedure: VIDEO BRONCHOSCOPY WITH ENDOBRONCHIAL NAVIGATION;  Surgeon: [name],  MD;  Location: [place];  Service: Thoracic;  Laterality: Right;    Current Medications: Current Meds  Medication Sig  . albuterol (PROVENTIL HFA;VENTOLIN HFA) 108 (90 Base) MCG/ACT inhaler Inhale 1-2 puffs into the lungs every 6 (six) hours as needed for wheezing or shortness of breath.  Marland Kitchen aspirin EC 81 MG tablet Take 81 mg by mouth daily.  Marland Kitchen atorvastatin (LIPITOR) 40 MG tablet TAKE ONE TABLET BY MOUTH ONCE DAILY AT 6PM.  . LORazepam (ATIVAN) 2 MG tablet Take 2 mg by mouth 2 (two) times daily.   . Magnesium 400 MG TABS Take 400 mg by mouth daily.  Marland Kitchen oxyCODONE-acetaminophen (PERCOCET/ROXICET) 5-325 MG tablet Take 1 tablet by mouth 2 (two) times daily. *May take one tablet three times daily as needed for pain  . OXYGEN Inhale 2 L into the lungs at bedtime. **2L with sleep and exertion if needed  . pantoprazole (PROTONIX) 40 MG tablet TAKE 1 TABLET BY MOUTH DAILY BEFORE BREAKFAST.  Marland Kitchen Phenyleph-Doxylamine-DM-APAP (NYQUIL SEVERE COLD/FLU PO) Take 1 Dose by mouth at bedtime.  . potassium chloride SA (K-DUR,KLOR-CON) 20 MEQ tablet Take 1 tablet (20 mEq total) by mouth  2 (two) times daily.  Marland Kitchen rOPINIRole (REQUIP) 0.25 MG tablet Take 0.25-0.75 mg by mouth at bedtime.   . sodium chloride (OCEAN) 0.65 % SOLN nasal spray Place 1 spray into both nostrils as needed for congestion.  . torsemide (DEMADEX) 20 MG tablet Take 1 tablet (20 mg total) by mouth 2 (two) times daily.  Marland Kitchen umeclidinium-vilanterol (ANORO ELLIPTA) 62.5-25 MCG/INH AEPB Inhale 1 puff into the lungs daily.    Allergies:   Xarelto [rivaroxaban]   Social History   Socioeconomic History  . Marital status: Divorced    Spouse name: Not on file  . Number of children: Not on file  . Years of education: Not on file  . Highest education level: Not on file  Occupational History  . Not on file  Social Needs  . Financial resource strain: Not on file  . Food insecurity:    Worry: Not on file    Inability: Not on file  . Transportation needs:      Medical: Not on file    Non-medical: Not on file  Tobacco Use  . Smoking status: Current Every Day Smoker    Packs/day: 2.00    Years: 56.00    Pack years: 112.00    Types: E-cigarettes, Cigarettes  . Smokeless tobacco: Never Used  Substance and Sexual Activity  . Alcohol use: Yes    Alcohol/week: 1.8 oz    Types: 3 Shots of liquor per week    Comment: nightly  . Drug use: Never  . Sexual activity: Yes    Birth control/protection: None  Lifestyle  . Physical activity:    Days per week: Not on file    Minutes per session: Not on file  . Stress: Not on file  Relationships  . Social connections:    Talks on phone: Not on file    Gets together: Not on file    Attends religious service: Not on file    Active member of club or organization: Not on file    Attends meetings of clubs or organizations: Not on file    Relationship status: Not on file  Other Topics Concern  . Not on file  Social History Narrative  . Not on file     Family History:  The patient's family history includes Dementia in his mother; Diabetes in his maternal grandmother and other.  ROS:   Please see the history of present illness.  All other systems are reviewed and otherwise negative.    PHYSICAL EXAM:   VS:  BP 122/78   Pulse 61   Ht 5\' 10"  (1.778 m)   Wt 204 lb (92.5 kg)   SpO2 98%   BMI 29.27 kg/m   BMI: Body mass index is 29.27 kg/m. GEN: Well nourished, well developed pale WM in no acute distress HEENT: normocephalic, atraumatic Neck: no JVD, carotid bruits, or masses Cardiac: RRR; no murmurs, rubs, or gallops, 1+ stiff chronically thickened BLE pitting edema  Respiratory: diffusely diminished without wheezes, rales or rhonchi. normal work of breathing GI: soft, nontender, nondistended, + BS MS: no deformity or atrophy Skin: warm and dry, no rash Neuro:  Alert and Oriented x 3, Strength and sensation are intact, follows commands Psych: euthymic mood, full affect  Wt Readings from  Last 3 Encounters:  06/21/18 204 lb (92.5 kg)  05/29/18 202 lb (91.6 kg)  05/23/18 213 lb 3.2 oz (96.7 kg)      Studies/Labs Reviewed:   EKG:   EKG was not ordered today.  Recent Labs: 05/17/2018: ALT 20 05/26/2018: B Natriuretic Peptide 380.0 05/28/2018: Hemoglobin 9.5; Platelets 209 05/29/2018: Magnesium 1.9 05/30/2018: BUN 20; Creatinine, Ser 1.20; Potassium 4.0; Sodium 134   Lipid Panel No results found for: CHOL, TRIG, HDL, CHOLHDL, VLDL, LDLCALC, LDLDIRECT  Additional studies/ records that were reviewed today include: Summarized above.   ASSESSMENT & PLAN:   1. Shortness of breath - I suspect this is primarily driven by advanced COPD in setting of ongoing tobacco abuse but also am concerned for this question of recent radiation pneumonitis. I am a little worried about the continuity for his lung cancer. Per his report he does not think anyone is actually following this aside from radiation oncology with whom he has an appointment in several months. He has not seen general oncology at all, nor pulmonology in several months. Will try to get him in this week with pulmonology. In the meantime will recheck CBC, BMET, BNP to re-eval volume status and anemia. His weight is up 2lb since discharge so we may have some room to advance his diuretic. However, his BNP was rather low in the hospital and out of proportion to the degree of dyspnea he is reporting. Note he had totally normal coronaries in 2017 so I would be less concerned about progressive blockage. He requests nicotine inhaler rx which we will try. 2. Lower extremity edema - I suspect this is due to RHF in setting of his lung disease. However, given malignancy, will evaluate with LE duplex today to exclude DVT. If this is present would recommend hospital admission given prior issues with bleeding on anticoagulation. If he were to refuse, IVC filter would need to be considered. We would obviously cross that bridge when we come to  it. 3. Chronic combined CHF - appears slightly volume overloaded from a right heart perspective. See above. Will f/u labs to decide next steps. Reviewed 2g sodium restriction, 2L fluid restriction, daily weights with patient. 4. Pericardial effusion - small-moderate by recent echocardiogram. I will forward to Dr. Bronson Ing the patient's progress to see if he has any other thoughts. This has been an intermittent issue over the years. Clinically he is not describing any symptoms compatible with pericarditis. 5. Anemia - Hgb began to downtrend in the hospital, will repeat given continued dyspnea. 6. Paroxysmal atrial flutter - pt declined anticoagulation so focus is on making sure rate controlled. Rate controlled at present time.  Disposition: F/u with previously arranged APP appointment 8/1.   Medication Adjustments/Labs and Tests Ordered: Current medicines are reviewed at length with the patient today.  Concerns regarding medicines are outlined above. Medication changes, Labs and Tests ordered today are summarized above and listed in the Patient Instructions accessible in Encounters.   Signed, Charlie Pitter, PA-C  06/21/2018 2:13 PM    [facility] in [place]. [place], [place] [address] Ph: [phone]; Fax [phone]

## 2018-06-21 ENCOUNTER — Ambulatory Visit (HOSPITAL_COMMUNITY)
Admission: RE | Admit: 2018-06-21 | Discharge: 2018-06-21 | Disposition: A | Discharge status: Home / Self Care | Payer: Medicare Other | Source: Ambulatory Visit | Attending: Physician Assistant | Admitting: Physician Assistant

## 2018-06-21 ENCOUNTER — Ambulatory Visit (INDEPENDENT_AMBULATORY_CARE_PROVIDER_SITE_OTHER): Payer: Medicare Other | Admitting: Physician Assistant

## 2018-06-21 ENCOUNTER — Other Ambulatory Visit: Payer: Self-pay

## 2018-06-21 ENCOUNTER — Other Ambulatory Visit (HOSPITAL_COMMUNITY)
Admission: RE | Admit: 2018-06-21 | Discharge: 2018-06-21 | Disposition: A | Discharge status: Home / Self Care | Payer: Medicare Other | Source: Ambulatory Visit | Attending: Physician Assistant | Admitting: Physician Assistant

## 2018-06-21 ENCOUNTER — Encounter: Payer: Self-pay | Admitting: Physician Assistant

## 2018-06-21 VITALS — BP 122/78 | HR 61 | Ht 70.0 in | Wt 204.0 lb

## 2018-06-21 DIAGNOSIS — I2510 Atherosclerotic heart disease of native coronary artery without angina pectoris: Secondary | ICD-10-CM | POA: Diagnosis not present

## 2018-06-21 DIAGNOSIS — I5042 Chronic combined systolic (congestive) and diastolic (congestive) heart failure: Secondary | ICD-10-CM

## 2018-06-21 DIAGNOSIS — I313 Pericardial effusion (noninflammatory): Secondary | ICD-10-CM

## 2018-06-21 DIAGNOSIS — I2584 Coronary atherosclerosis due to calcified coronary lesion: Secondary | ICD-10-CM

## 2018-06-21 DIAGNOSIS — R0602 Shortness of breath: Secondary | ICD-10-CM | POA: Diagnosis not present

## 2018-06-21 DIAGNOSIS — R600 Localized edema: Secondary | ICD-10-CM

## 2018-06-21 DIAGNOSIS — I3139 Other pericardial effusion (noninflammatory): Secondary | ICD-10-CM

## 2018-06-21 DIAGNOSIS — D649 Anemia, unspecified: Secondary | ICD-10-CM

## 2018-06-21 DIAGNOSIS — I4892 Unspecified atrial flutter: Secondary | ICD-10-CM | POA: Diagnosis not present

## 2018-06-21 LAB — BASIC METABOLIC PANEL
Anion gap: 9 (ref 5–15)
BUN: 21 mg/dL (ref 8–23)
CHLORIDE: 91 mmol/L — AB (ref 98–111)
CO2: 29 mmol/L (ref 22–32)
Calcium: 8.7 mg/dL — ABNORMAL LOW (ref 8.9–10.3)
Creatinine, Ser: 1.46 mg/dL — ABNORMAL HIGH (ref 0.61–1.24)
GFR calc non Af Amer: 48 mL/min — ABNORMAL LOW (ref >60)
GFR, EST AFRICAN AMERICAN: 56 mL/min — AB (ref >60)
Glucose, Bld: 108 mg/dL — ABNORMAL HIGH (ref 70–99)
POTASSIUM: 4.9 mmol/L (ref 3.5–5.1)
Sodium: 129 mmol/L — ABNORMAL LOW (ref 135–145)

## 2018-06-21 LAB — CBC WITH DIFFERENTIAL/PLATELET
BASOS ABS: 0 10*3/uL (ref 0.0–0.1)
Basophils Relative: 0 %
Eosinophils Absolute: 0.1 10*3/uL (ref 0.0–0.7)
Eosinophils Relative: 1 %
HEMATOCRIT: 34.4 % — AB (ref 39.0–52.0)
HEMOGLOBIN: 11 g/dL — AB (ref 13.0–17.0)
Lymphocytes Relative: 9 %
Lymphs Abs: 0.7 10*3/uL (ref 0.7–4.0)
MCH: 30.8 pg (ref 26.0–34.0)
MCHC: 32 g/dL (ref 30.0–36.0)
MCV: 96.4 fL (ref 78.0–100.0)
MONO ABS: 0.6 10*3/uL (ref 0.1–1.0)
Monocytes Relative: 9 %
NEUTROS ABS: 5.7 10*3/uL (ref 1.7–7.7)
Neutrophils Relative %: 81 %
Platelets: 186 10*3/uL (ref 150–400)
RBC: 3.57 MIL/uL — ABNORMAL LOW (ref 4.22–5.81)
RDW: 14.7 % (ref 11.5–15.5)
WBC: 7 10*3/uL (ref 4.0–10.5)

## 2018-06-21 LAB — BRAIN NATRIURETIC PEPTIDE: B Natriuretic Peptide: 420 pg/mL — ABNORMAL HIGH (ref 0.0–100.0)

## 2018-06-21 MED ORDER — NICOTINE 10 MG IN INHA: 1.0000 | RESPIRATORY_TRACT | 0 refills | Status: DC | PRN

## 2018-06-21 NOTE — Patient Outreach (Signed)
[facility]) Care Management  06/21/2018  Shaun May [birth year] [id number]    EMMI-Heart Failure Red on EMMI Alert: Day #20 Date:06/21/2018 Red Alert Reason: New/Worsening problems? Yes New /worsening Shortness of Breath?  Yes Lost interest in things they use to enjoy? Yes   Outreach attempt # 1 to the patient. The patient's significant other answered the phone and stated that he was unable to come to the phone at this time to talk.  She asked if I could call him later and stated that he has an appointment today with his physician.    Plan:RN Health Coach will make another outreach attempt to the patient within the next four business days.  Lazaro Arms RN, BSN, [place] Direct Dial:  [phone]  Fax: [phone]

## 2018-06-21 NOTE — Patient Instructions (Addendum)
For patients with congestive heart failure, we give them these special instructions:  1. Follow a low-salt diet - you are allowed no more than 2,000mg  of sodium per day. Watch your fluid intake. In general, you should not be taking in more than 2 liters of fluid per day (no more than 8 glasses per day). This includes sources of water in foods like soup, coffee, tea, milk, etc. 2. Weigh yourself on the same scale at same time of day and keep a log. 3. Call your doctor: (Anytime you feel any of the following symptoms)  - 3lb weight gain overnight or 5lb within a few days - Shortness of breath, with or without a dry hacking cough  - Swelling in the hands, feet or stomach  - If you have to sleep on extra pillows at night in order to breathe   IT IS IMPORTANT TO LET YOUR DOCTOR KNOW EARLY ON IF YOU ARE HAVING SYMPTOMS SO WE CAN HELP YOU! Medication Instructions:  Your physician recommends that you continue on your current medications as directed. Please refer to the Current Medication list given to you today.   Labwork: Your physician recommends that you return for lab work today.   Testing/Procedures: Your physician has requested that you have a lower or upper extremity venous duplex. This test is an ultrasound of the veins in the legs or arms. It looks at venous blood flow that carries blood from the heart to the legs or arms. Allow one hour for a Lower Venous exam. Allow thirty minutes for an Upper Venous exam. There are no restrictions or special instructions.    Follow-Up: Your physician recommends that you schedule a follow-up appointment in: 4-6 Weeks   Any Other Special Instructions Will Be Listed Below (If Applicable).     If you need a refill on your cardiac medications before your next appointment, please call your pharmacy.   Thank you for choosing [facility]!

## 2018-06-22 ENCOUNTER — Ambulatory Visit (INDEPENDENT_AMBULATORY_CARE_PROVIDER_SITE_OTHER)
Admission: RE | Admit: 2018-06-22 | Discharge: 2018-06-22 | Disposition: A | Discharge status: Home / Self Care | Payer: Medicare Other | Source: Ambulatory Visit | Attending: Internal Medicine | Admitting: Internal Medicine

## 2018-06-22 ENCOUNTER — Other Ambulatory Visit: Payer: Self-pay

## 2018-06-22 ENCOUNTER — Telehealth: Payer: Self-pay | Admitting: *Deleted

## 2018-06-22 ENCOUNTER — Encounter: Payer: Self-pay | Admitting: Internal Medicine

## 2018-06-22 ENCOUNTER — Ambulatory Visit (INDEPENDENT_AMBULATORY_CARE_PROVIDER_SITE_OTHER): Payer: Medicare Other | Admitting: Internal Medicine

## 2018-06-22 VITALS — BP 104/60 | HR 60 | Ht 70.0 in | Wt 201.0 lb

## 2018-06-22 DIAGNOSIS — F17210 Nicotine dependence, cigarettes, uncomplicated: Secondary | ICD-10-CM | POA: Diagnosis not present

## 2018-06-22 DIAGNOSIS — I2510 Atherosclerotic heart disease of native coronary artery without angina pectoris: Secondary | ICD-10-CM

## 2018-06-22 DIAGNOSIS — I2584 Coronary atherosclerosis due to calcified coronary lesion: Secondary | ICD-10-CM

## 2018-06-22 DIAGNOSIS — J449 Chronic obstructive pulmonary disease, unspecified: Secondary | ICD-10-CM

## 2018-06-22 DIAGNOSIS — J9611 Chronic respiratory failure with hypoxia: Secondary | ICD-10-CM | POA: Diagnosis not present

## 2018-06-22 DIAGNOSIS — Z79899 Other long term (current) drug therapy: Secondary | ICD-10-CM

## 2018-06-22 MED ORDER — PREDNISONE 10 MG PO TABS: ORAL_TABLET | ORAL | 0 refills | Status: DC

## 2018-06-22 MED ORDER — FLUTICASONE-UMECLIDIN-VILANT 100-62.5-25 MCG/INH IN AEPB: 1.0000 | INHALATION_SPRAY | Freq: Every day | RESPIRATORY_TRACT | 11 refills | Status: AC

## 2018-06-22 MED ORDER — FLUTICASONE-UMECLIDIN-VILANT 100-62.5-25 MCG/INH IN AEPB: 1.0000 | INHALATION_SPRAY | Freq: Every day | RESPIRATORY_TRACT | 0 refills | Status: DC

## 2018-06-22 NOTE — Telephone Encounter (Signed)
Pt verified medications at office visit 06/21/2018 with Melina Copa, PA.

## 2018-06-22 NOTE — Progress Notes (Signed)
Subjective:     Patient ID: Shaun May, male   DOB: [birth year],    MRN: [id number]    Brief patient profile:  21 yowm active smoker with GOLD IV s/p RT for presumed lung ca 01/26/18     History of Present Illness  08/11/2017 1st [facility] office visit/ Shaun May   Chief Complaint  Patient presents with  . Pulmonary Consult    Switching care from Dr Elsworth Soho. Pt c/o increased coughing for at least the past month- hard to produce sputum and when he does it's clear and thick. He gets winded with taking the trash down the driveway.   doe = mb and back flat mild sob x 50 ft = MMRC3 = can't walk 100 yards even at a slow pace at a flat grade s stopping due to sob   Sleeps on 2lpm at bedtime  And uses prn daytime  Cough worse first thing in am / congested sounding but all white at this point rec Plan A = Automatic = Bevespi Take 2 puffs first thing in am and then another 2 puffs about 12 hours later.  Work on inhaler technique:   Plan B = Backup Only use your albuterol (Proair) as a rescue medication Plan C = Crisis - only use your albuterol nebulizer if you first try Plan B      09/16/2017  f/u ov/Shaun May re:  GOLD IV/ still smoking  Chief Complaint  Patient presents with  . Follow-up    Breathing is about the same. He is coughing a bit less and has not needed albuterol inhaler or neb.    still struggling to get back from mb to house but no need for saba "you told me to stop it anyway" Somewhat congested in am but no purulent sputum or hemoptysis.  rec Plan A = Automatic =  Stiolto 2 pffs each am  Work on inhaler technique:  Plan B = Backup Only use your albuterol as a rescue medication  Plan C = Crisis - only use your albuterol nebulizer if you first try Plan B and it fails to help > ok to use the nebulizer up to every 4 hours but if start needing it regularly call for immediate appointment      10/11/2017  f/u ov/Shaun May re: GOLD IV / still smoking / only using 02 at hs  Chief  Complaint  Patient presents with  . Follow-up    coughing better this week, SOB on activity about the same, sleeps in recliner to help with breathing, O2 at night    Walks across parking lot s [facility] parking then looks for scooter/ slow pace when he does walk anywhere  [facility] = can't walk 100 yards even at a slow pace at a flat grade s stopping due to sob   Some thick white mucus each am, maybe a tbp or two never bloody  rec Plan A = Automatic =  Stiolto 2 pffs each am  Work on inhaler technique:    Plan B = Backup Only use your albuterol as a rescue medication Plan C = Crisis - only use your albuterol nebulizer if you first try Plan B     06/22/2018  Acute extended  ov/Shaun May re:  Worse sob x 3 months/ more cough/ congestion / wheeze  Chief Complaint  Patient presents with  . Acute Visit    Pt states breathing has been progressively worse since the last visit. He is getting winded walking from one  room to the next.  He also c/o cough and wheezing. Cough is occ prod with clear sputum.  He is using proair 3-4 x per wk on average.   Dyspnea:  Room to room = [facility]  = sob if tries to leave home or while getting dressed   Cough: worse vs baseline  esp in am but mucus mostly just clear  X sev tsp   SABA use: once a day  hfa/ once a week on neb 02: 2lpm hs only   No obvious day to day or daytime variability or assoc excess/ purulent sputum or mucus plugs or hemoptysis or cp or chest tightness, subjective wheeze or overt sinus or hb symptoms.   Sleeping: recliner x 45 degrees x  A year  without nocturnal   exacerbation  of respiratory  c/o's or need for noct saba. Also denies any obvious fluctuation of symptoms with weather or environmental changes or other aggravating or alleviating factors except as outlined above   No unusual exposure hx or h/o childhood pna/ asthma or knowledge of premature birth.  Current Allergies, Complete Past Medical History, Past Surgical History, Family History, and  Social History were reviewed in [employer] record.  ROS  The following are not active complaints unless bolded Hoarseness, sore throat, dysphagia, dental problems, itching, sneezing,  nasal congestion or discharge of excess mucus or purulent secretions, ear ache,   fever, chills, sweats, unintended wt loss or wt gain, classically pleuritic or exertional cp,  orthopnea pnd or arm/hand swelling  or leg swelling improved, presyncope, palpitations, abdominal pain, anorexia, nausea, vomiting, diarrhea  or change in bowel habits or change in bladder habits, change in stools or change in urine, dysuria, hematuria,  rash, arthralgias, visual complaints, headache, numbness, weakness or ataxia or problems with walking or coordination,  change in mood or  memory.        Current Meds  Medication Sig  . albuterol (PROVENTIL HFA;VENTOLIN HFA) 108 (90 Base) MCG/ACT inhaler Inhale 1-2 puffs into the lungs every 6 (six) hours as needed for wheezing or shortness of breath.  Marland Kitchen aspirin EC 81 MG tablet Take 81 mg by mouth daily.  Marland Kitchen atorvastatin (LIPITOR) 40 MG tablet TAKE ONE TABLET BY MOUTH ONCE DAILY AT 6PM.  . carvedilol (COREG) 25 MG tablet Take 25 mg by mouth 2 (two) times daily with a meal.   . Dextromethorphan-Guaifenesin (MUCINEX DM MAXIMUM STRENGTH) 60-1200 MG TB12 Take 1 tablet by mouth daily.  Marland Kitchen LORazepam (ATIVAN) 2 MG tablet Take 2 mg by mouth 2 (two) times daily.   . Magnesium 400 MG TABS Take 400 mg by mouth daily.  Marland Kitchen oxyCODONE-acetaminophen (PERCOCET/ROXICET) 5-325 MG tablet Take 1 tablet by mouth 2 (two) times daily. *May take one tablet three times daily as needed for pain  . OXYGEN Inhale 2 L into the lungs at bedtime. **2L with sleep and exertion if needed  . pantoprazole (PROTONIX) 40 MG tablet TAKE 1 TABLET BY MOUTH DAILY BEFORE BREAKFAST.  Marland Kitchen Phenyleph-Doxylamine-DM-APAP (NYQUIL SEVERE COLD/FLU PO) Take 1 Dose by mouth at bedtime.  . potassium chloride SA (K-DUR,KLOR-CON)  20 MEQ tablet Take 1 tablet (20 mEq total) by mouth 2 (two) times daily.  Marland Kitchen rOPINIRole (REQUIP) 0.25 MG tablet Take 0.25-0.75 mg by mouth at bedtime.   . sodium chloride (OCEAN) 0.65 % SOLN nasal spray Place 1 spray into both nostrils as needed for congestion.  . torsemide (DEMADEX) 20 MG tablet Take 1 tablet (20 mg total) by mouth  2 (two) times daily.  Marland Kitchen   umeclidinium-vilanterol (ANORO ELLIPTA) 62.5-25 MCG/INH AEPB Inhale 1 puff into the lungs daily.                           Objective:   Physical Exam    amb wm nad   06/22/2018       201   10/11/2017      188   08/11/17 186 lb (84.4 kg)  08/01/17 190 lb (86.2 kg)  02/21/17 180 lb (81.6 kg)    Vital signs reviewed - Note on arrival 02 sats  95% on RA       HEENT: nl dentition, turbinates bilaterally, and oropharynx. Nl external ear canals without cough reflex   NECK :  without JVD/Nodes/TM/ nl carotid upstrokes bilaterally   LUNGS: no acc muscle use,  Barrel chest with mild/ mod insp/ exp rhonchi bilaterally      CV:  RRR  no s3 or murmur or increase in P2, and no edema   ABD:  soft and nontender with nl inspiratory excursion in the supine position. No bruits or organomegaly appreciated, bowel sounds nl  MS:  Very slow gait/ ext warm without deformities, calf tenderness, cyanosis or clubbing No obvious joint restrictions   SKIN: warm and dry without lesions    NEURO:  alert, approp, nl sensorium with  no motor or cerebellar deficits apparent.        CXR PA and Lateral:   06/22/2018 :    I personally reviewed images and agree with radiology impression as follows:    Minimal pulmonary vascular prominence, diminished since the prior study. No other change. Scarring in the right upper lobe     Assessment:

## 2018-06-22 NOTE — Patient Outreach (Signed)
[facility]) Care Management  06/22/2018  DARSHAN SOLANKI 1950/12/31 [id number]   EMMI-Heart Failure Red on EMMI Alert: Day #20 Date:06/22/2018 Red Alert Reason: New/Worsening problems? Yes New /worsening Shortness of Breath?  Yes Lost interest in things they use to enjoy? Yes   Patient returned  phone call. HIPAA verified.  All issues were addressed.  The patient states that he went to his appointment with his cardiologist on 6/17 concerning his breathing.  He states he  was given a prescription for Trelegy that he has not filled. I encouraged the patient to get his medication filled today and explained the medication is to help with his breathing.  He was instructed to increase his torsemide to 40 mg bid for three days then back to 20 mg bid.  He was given a fluid restriction of 48 oz a day and 2000 mg of sodium per day.  The patient states that he was also given a prescription for a nicotine inhaler to help him quit smoking but it will cost him 185 dollars.  I advised the patient that I will put in a referral to pharmacy to see if they will be able to assist him with cost. I reviewed signs and symptoms of heart failure, discussed with patient CHF action plan, encouraged patient to weigh daily, and encouraged medication compliance. The patient states that he was seen at pulmonology today.   I also advise the patient that I would be calling him again if he has any abnormal responses to the EMMI calls.  The patient verbalized understanding and thanked me for the call.  Plan:  RN Health Coach will call the patient at the next scheduled interval.  Lazaro Arms RN, BSN, [place] Direct Dial:  [phone]  Fax: [phone]

## 2018-06-22 NOTE — Telephone Encounter (Signed)
Called pt. No answer, no voicemail. Will call pt back later today.

## 2018-06-22 NOTE — Progress Notes (Signed)
I agree with your assessment and plan.  I also reviewed his labs and his negative lower extremity Dopplers.  It does sound more pulmonary in etiology and agree that he needs pulmonary and oncology follow-up.

## 2018-06-22 NOTE — Telephone Encounter (Signed)
Pt informed of test results and appt w/ Dr. Melvyn Novas on 06/22/18 @ 2:30. Pt voiced understanding

## 2018-06-22 NOTE — Patient Outreach (Signed)
[facility]) Care Management  06/22/2018  Shaun May 03-19-1951 [id number]   EMMI-Heart Failure Red on EMMI Alert: Day #20 Date:06/21/2018 Red Alert Reason: New/Worsening problems? Yes New /worsening Shortness of Breath?  Yes Lost interest in things they use to enjoy? Yes  Outreach attempt # 2 to the patient. Gentleman answered the phone and stated that he was not there.  HIPAA compliant voicemail left with contact information.  Plan: RN Health Coach will make another outreach attempt to the patient within the next four business days.  Lazaro Arms RN, BSN, [place] Direct Dial:  [phone]  Fax: [phone]

## 2018-06-22 NOTE — Assessment & Plan Note (Addendum)
On 2lpm hs and prn daytime  - 09/16/2017   Walked RA  2 laps @ 185 ft each stopped due to  Legs gave out, sats 93%  - ONO RA  09/20/17  desat x 8:28sec at < 89% > continue 2lpm hs    Adequate control on present rx, reviewed in detail with pt > no change in rx needed  = 2lpm hs and none daytime as sats are fine even during flare

## 2018-06-22 NOTE — Telephone Encounter (Signed)
-----   Message from Charlie Pitter, [place] sent at 06/21/2018  5:07 PM EDT ----- Please let patient know venous duplex negative, no DVT (see other lab result note) Dayna Dunn PA-C

## 2018-06-22 NOTE — Telephone Encounter (Signed)
-----   Message from Charlie Pitter, [place] sent at 06/21/2018  5:07 PM EDT ----- Please call patient. Labs show decreased sodium level which I suspect is related to his poor lung function +/- his lung CA. Recent Cr has run 1.1-1.46. It was 1.46 when he needed diuresis so I think this is generally stable.  We can increase the torsemide to 40mg  QAM/20mg  QPM for 3 days then back to 20mg  BID.  Limit fluids to 48 oz per day. Limit sodium to 2000mg  per day. Can you confirm he got in to see pulmonary this week? Had requested Kisha to call to arrange, OK to see APP. Keep f/u 8/1 as scheduled. Would recommend BMET in 1 week to trend Melina Copa PA-C

## 2018-06-22 NOTE — Patient Instructions (Addendum)
Prednisone 10 mg take  4 each am x 2 days,   2 each am x 2 days,  1 each am x 2 days and stop    Plan A = Automatic = Trelegy  On click each am   Plan B = Backup Only use your albuterol as a rescue medication to be used if you can't catch your breath by resting or doing a relaxed purse lip breathing pattern.  - The less you use it, the better it will work when you need it. - Ok to use the inhaler up to 2 puffs  every 4 hours if you must but call for appointment if use goes up over your usual need - Don't leave home without it !!  (think of it like the spare tire for your car)   Plan C = Crisis - only use your albuterol nebulizer if you first try Plan B and it fails to help > ok to use the nebulizer up to every 4 hours but if start needing it regularly call for immediate appointment   Please remember to go to the  x-ray department downstairs in the basement  for your tests - we will call you with the results when they are available.      Please schedule a follow up office visit in 4 weeks, sooner if needed with pfts on return - consider change coreg to bisoprolol next ov

## 2018-06-22 NOTE — Assessment & Plan Note (Addendum)
- Spirometry 08/11/2017  FEV1 1.04 (30%)  Ratio 44 p 11% improvement on no maint rx  - 08/11/2017   try bevespi - PFT's  09/16/2017  FEV1 1.05 (30  % ) ratio 46  p 14 % improvement from saba p bevespi prior to study with DLCO  41/45 % corrects to 53  % for alv volume   - 09/16/2017    try stiolto as bevespi too expensive> found anoro cheapest started around 03/2018 - 06/22/2018 flare on anoro > changed to trelegy  - 06/22/2018  After extensive coaching inhaler device  effectiveness =    75% hfa/ 90% dpi    Acute on chronic flare really going on for months   DDX of  difficult airways management almost all start with A and  include Adherence, Ace Inhibitors, Acid Reflux, Active Sinus Disease, Alpha 1 Antitripsin deficiency, Anxiety masquerading as Airways dz,  ABPA,  Allergy(esp in young), Aspiration (esp in elderly), Adverse effects of meds,  Active smokers, A bunch of PE's (a small clot burden can't cause this syndrome unless there is already severe underlying pulm or vascular dz with poor reserve) plus two Bs  = Bronchiectasis and Beta blocker use..and one C= CHF  Adherence is always the initial "prime suspect" and is a multilayered concern that requires a "trust but verify" approach in every patient - starting with knowing how to use medications, especially inhalers, correctly, keeping up with refills and understanding the fundamental difference between maintenance and prns vs those medications only taken for a very short course and then stopped and not refilled.  - see hfa teaching   Active smoking greatest concern >  (see separate a/p)   ? Acid (or non-acid) GERD > always difficult to exclude as up to 75% of pts in some series report no assoc GI/ Heartburn symptoms> rec continue max (24h)  acid suppression and diet restrictions/ reviewed     ? Allergy/asthma > pred x 6 days and add ICS = trelegy  ? Adverse effects of DPI > hopefully not but if can't tol trelegy need to go back to stiolto  ? Beta  blocker effects - In the setting of respiratory symptoms of unknown etiology,  It would be preferable to use bystolic, the most beta -1  selective Beta blocker available in sample form, with bisoprolol the most selective generic choice  on the market, at least on a trial basis, to make sure the spillover Beta 2 effects of the less specific Beta blockers are not contributing to this patient's symptoms.   ? Chf>>   Edema in feet/ vascular congestion on cxr   better on present rx but breathing is not>>  rules against cardiac asthma   I had an extended discussion with the patient reviewing all relevant studies completed to date and  lasting 25 minutes of a 40  minute acute office visit to re-eatablish with me     re  severe non-specific but potentially very serious refractory respiratory symptoms of uncertain and potentially multiple  etiologies.   Each maintenance medication was reviewed in detail including most importantly the difference between maintenance and prns and under what circumstances the prns are to be triggered using an action plan format that is not reflected in the computer generated alphabetically organized AVS.    Please see AVS for specific instructions unique to this office visit that I personally wrote and verbalized to the the pt in detail and then reviewed with pt  by my nurse highlighting  any changes in therapy/plan of care  recommended at today's visit.

## 2018-06-22 NOTE — Assessment & Plan Note (Signed)

## 2018-06-23 NOTE — Progress Notes (Signed)
ATC, NA and no option to leave msg 

## 2018-06-25 ENCOUNTER — Encounter: Payer: Self-pay | Admitting: Internal Medicine

## 2018-06-26 ENCOUNTER — Other Ambulatory Visit: Payer: Self-pay

## 2018-06-26 ENCOUNTER — Telehealth: Payer: Self-pay

## 2018-06-26 NOTE — Progress Notes (Signed)
ATC, NA on home number  Called work number and he was not in

## 2018-06-26 NOTE — Telephone Encounter (Signed)
[facility] Olivia Mackie called to say pt is not following sodium restricted diet, had steak and potato with salt, pepper, and garlic salt. Weights are inconsistent.She reccommended he call our office but he wants to wait.I will reach out to him

## 2018-06-26 NOTE — Patient Outreach (Signed)
[facility] [facility]) Care Management  06/26/2018  Shaun May 1951-01-17 [id number]  67 year old male referred to [facility].  [facility] services requested for medication assistance with nicotine inhaler.  PMHx includes, but not limited to, second degree heart block, hypertension, systolic heart failure, atrial flutter, COPD and smoker.  Successful outreach to [name].  HIPAA identifiers verified.   Subjective: [name] reports that his new prescription for Nicotrol Inhaler is going to cost $185.  He reports that he did not get it filled. He states that he has tried Chantix, but didn't like the side effects. He reports that some of his friends have told him that the nicotine inhaler also had some bad side effects like dizziness and rapid heart beat. He states that he is not sure that he wants to get this prescription filled and would like to think about it before we begin with patient assistance application.  I requested that he give me a call in the future if he would like to proceed with the application.  Informed him that I will close his [place] case at this time.  Plan: Route discipline closure letter to PCP, Dr. Nevada Crane.  Inform [facility] RN, Lazaro Arms of case closure.   Joetta Manners, PharmD Clinical Pharmacist [place] [phone]

## 2018-06-26 NOTE — Patient Outreach (Signed)
[place]) Care Management  06/26/2018  Shaun May 1951-07-14 [id number]   EMMI-Heart Failure Red on EMMI Alert: Day #24 Date:06/21/2018 Red Alert Reason: New/Worsening problems? Yes New /worsening Shortness of Breath?Yes   Outreach attempt # 1 to the patient.  HIPAA verified.  All issued were addressed.  The patient states that he is on prednisone and feels that it may be causing him to have headaches.  He seems to only be having the headaches at night. He is also still having shortness of breath.  The patient states that he is using his new inhaler as prescribed and all other meds.  The patient states that his weight is 202 today. He denies any chest pain, dizziness, or swelling.  When asked what his food intake has been like he states that he cooked a steak on the grill that he seasoned with garlic salt and pepper.  I discussed with the patient about his fluid and salt restriction.  We discussed about smoking cessation. I reviewed signs and symptoms of heart failure, discussed with patient CHF action plan, discussed being in the heat and how it will affect his breathing.  I encouraged the patient to call his doctors office and inform them of his condition but he stated " I will wait until tomorrow and see how it is".  I informed the patient that I would call his physician office and update them on his situation.  He stated that would be fine. RN Health Coach called and spoke with [name] in his physician office and explained the situation.     Plan:  RN Health Coach will call the patient at the next scheduled interval.  Lazaro Arms RN, BSN, [place] Direct Dial:  [phone]  Fax: [phone]

## 2018-06-27 ENCOUNTER — Other Ambulatory Visit: Payer: Self-pay

## 2018-06-27 NOTE — Patient Outreach (Signed)
[facility]) Care Management  06/27/2018  DELPHIN FUNES 02/13/1951 [id number]    EMMI-Heart Failure Red on EMMI Alert: Day #26 Date:06/27/2018 Red Alert Reason: Any new problems? New/Worsening problems? Yes Other symptom/problems? Yes   Outreach attempt # 1 to the patient. No answer HIPAA compliant voicemail left with contact information.  Plan: RN Health Coach will make another outreach attempt to the patient within the next four business days.  Lazaro Arms RN, BSN, [place] Direct Dial:  [phone]  Fax: [phone]

## 2018-06-28 ENCOUNTER — Other Ambulatory Visit: Payer: Self-pay

## 2018-06-28 NOTE — Progress Notes (Signed)
ATC work- not in  [employer] home- NA and no VM

## 2018-06-28 NOTE — Patient Outreach (Signed)
06/28/2018  Shaun May [birth year] [id number]   EMMI-Heart Failure Red on EMMI Alert: Day #26 Date:06/28/2018 Red Alert Reason: Any new problems?New/Worsening problems? Yes Other symptom/problems? Yes  Patient returned call.  HIPAA verified.  All issues were addressed.  The patient states that he still has some shortness of breath but it has gotten better.  He weighed today and his weight was 198.  He denies chest pain, swelling, and cough.  He reports that he has an appointment with Dr Nevada Crane on Friday the 26th.  I discussed CHF action plan, dietary restrictions and adherence with medications.  The patient verbalized understanding.  I informed the patient that if I receive any abnormal responses to his calls I will call him again.  The patient thank me for calling and checking on him.   Plan: RN Health Coach will call the patient at the next scheduled interval.  Lazaro Arms RN, BSN, [facility] Direct [phone]

## 2018-06-28 NOTE — Patient Outreach (Signed)
[place] [facility]) Care Management  06/28/2018  Shaun May [birth year] [id number]   EMMI-Heart Failure Red on EMMI Alert: Day #26 Date:06/28/2018 Red Alert Reason: Any new problems? New/Worsening problems? Yes Other symptom/problems? Yes   Outreach attempt # 2 to the patient. Gentleman answered the phone and stated that the patient was not there at the moment. HIPAA compliant voicemail left with contact information.  Plan: RN Health Coach will make another outreach attempt to the patient within the next four business days.  Lazaro Arms RN, BSN, [place] Direct Dial: [phone]  [phone]

## 2018-06-29 ENCOUNTER — Other Ambulatory Visit: Payer: Self-pay

## 2018-06-29 ENCOUNTER — Ambulatory Visit: Payer: Self-pay

## 2018-06-29 ENCOUNTER — Other Ambulatory Visit (HOSPITAL_COMMUNITY)
Admission: RE | Admit: 2018-06-29 | Discharge: 2018-06-29 | Disposition: A | Discharge status: Home / Self Care | Payer: Medicare Other | Source: Ambulatory Visit | Attending: Physician Assistant | Admitting: Physician Assistant

## 2018-06-29 DIAGNOSIS — Z79899 Other long term (current) drug therapy: Secondary | ICD-10-CM | POA: Diagnosis not present

## 2018-06-29 LAB — BASIC METABOLIC PANEL
ANION GAP: 7 (ref 5–15)
BUN: 32 mg/dL — ABNORMAL HIGH (ref 8–23)
CALCIUM: 8.4 mg/dL — AB (ref 8.9–10.3)
CO2: 34 mmol/L — ABNORMAL HIGH (ref 22–32)
CREATININE: 1.45 mg/dL — AB (ref 0.61–1.24)
Chloride: 87 mmol/L — ABNORMAL LOW (ref 98–111)
GFR calc Af Amer: 56 mL/min — ABNORMAL LOW (ref >60)
GFR calc non Af Amer: 49 mL/min — ABNORMAL LOW (ref >60)
GLUCOSE: 111 mg/dL — AB (ref 70–99)
Potassium: 4 mmol/L (ref 3.5–5.1)
Sodium: 128 mmol/L — ABNORMAL LOW (ref 135–145)

## 2018-06-29 NOTE — Patient Outreach (Signed)
[facility] [facility]) Care Management  06/29/2018  Shaun May April 01, 1951 [id number]   EMMI-Heart Failure Red on EMMI Alert: Day #28 Date:06/29/2018 Red Alert Reason: New/Worsening problems? Yes  New/worsening shortness of breath Yes  Outreach attempt # 1 Patient able to verify HIPAA.  All issues addressed. The patient state that he is not having any new/worsening symptoms.  He is still having shortness of breath.  He denies any chest pain or swelling. His weight today is 199.  He states in the mornings he does have some coughing and phlegm but feels that it comes from him smoking. I encouraged the patient to cut down on his smoking and  discussed the CHF action plan with the patient.  He verbalized understanding.  He reports he  has an appointment today at the  [facility] center to have blood work done and appointment with Dr Nevada Crane tomorrow to discuss his legs.  I notified the patient that I would call him again if there are any abnormal reposes to his calls.  The patient thanked me for calling and appreciates me caring about his health.   Plan: RN Health Coach will outreach the patient at the next schedule interval.  [place], BSN, [place] Direct Dial:  [phone]  Fax: [phone]

## 2018-06-30 ENCOUNTER — Telehealth: Payer: Self-pay

## 2018-06-30 DIAGNOSIS — Z720 Tobacco use: Secondary | ICD-10-CM | POA: Diagnosis not present

## 2018-06-30 DIAGNOSIS — L2089 Other atopic dermatitis: Secondary | ICD-10-CM | POA: Diagnosis not present

## 2018-06-30 DIAGNOSIS — N189 Chronic kidney disease, unspecified: Secondary | ICD-10-CM | POA: Diagnosis not present

## 2018-06-30 DIAGNOSIS — I5020 Unspecified systolic (congestive) heart failure: Secondary | ICD-10-CM | POA: Diagnosis not present

## 2018-06-30 DIAGNOSIS — Z6827 Body mass index (BMI) 27.0-27.9, adult: Secondary | ICD-10-CM | POA: Diagnosis not present

## 2018-06-30 DIAGNOSIS — R911 Solitary pulmonary nodule: Secondary | ICD-10-CM | POA: Diagnosis not present

## 2018-06-30 DIAGNOSIS — Z712 Person consulting for explanation of examination or test findings: Secondary | ICD-10-CM | POA: Diagnosis not present

## 2018-06-30 DIAGNOSIS — D631 Anemia in chronic kidney disease: Secondary | ICD-10-CM | POA: Diagnosis not present

## 2018-06-30 DIAGNOSIS — J449 Chronic obstructive pulmonary disease, unspecified: Secondary | ICD-10-CM | POA: Diagnosis not present

## 2018-06-30 MED ORDER — TORSEMIDE 20 MG PO TABS: 20.0000 mg | ORAL_TABLET | Freq: Every day | ORAL | 3 refills | Status: DC

## 2018-06-30 MED ORDER — POTASSIUM CHLORIDE CRYS ER 20 MEQ PO TBCR: 20.0000 meq | EXTENDED_RELEASE_TABLET | Freq: Every day | ORAL | 3 refills | Status: DC

## 2018-06-30 NOTE — Telephone Encounter (Signed)
Pt will decrease both torsemide and potassium as directed.Has apt with pcp today, will forward lab work.He states he will call oncologist for apt

## 2018-06-30 NOTE — Telephone Encounter (Signed)
-----   Message from Charlie Pitter, [place] sent at 06/29/2018  3:18 PM EDT ----- Please call patient. Labs are similar to prior but possibly showing he might be on the drier side now. Most recent tele health reviewed, with continued downtrend in weight.  As stated before, labs show decreased sodium level which I suspect is related to his poor lung function +/- his lung CA. Recent Cr has run 1.1-1.46. So this appears generally stable, but BUN is slightly higher.  If swelling is under control I think he can go ahead and decrease torsemide to 20mg  once a day (but may take 1 extra tablet daily as needed for swelling), and decrease potassium to 71meq daily.  Keep f/u as planned. Really needs oncology follow-up as well - he needs to discuss with pulm or PCP.  Dayna Dunn PA-C

## 2018-07-03 ENCOUNTER — Other Ambulatory Visit: Payer: Self-pay

## 2018-07-03 NOTE — Patient Outreach (Signed)
[facility]) Care Management  07/03/2018  Shaun May Apr 21, 1951 [id number]    EMMI-Heart Failure Red on EMMI Alert: Day #31 Date:06/29/2018 Red Alert Reason: Weight (LBS) 199   Outreach attempt #1 to the patient.  Gentleman answered the phone and stated that he was not there.  HIPAA compliant voicemail left with contact information.  Plan: RN Health Coach will send letter. RN Health Coach will make another attempt to the patient within four business days.   Lazaro Arms RN, BSN, [place] Direct Dial:  [phone]  Fax: [phone]

## 2018-07-04 ENCOUNTER — Telehealth: Payer: Self-pay | Admitting: Student

## 2018-07-04 ENCOUNTER — Other Ambulatory Visit: Payer: Self-pay

## 2018-07-04 ENCOUNTER — Ambulatory Visit: Payer: Self-pay

## 2018-07-04 ENCOUNTER — Telehealth: Payer: Self-pay | Admitting: Cardiovascular Disease

## 2018-07-04 NOTE — Telephone Encounter (Signed)
Please give pt a call concerning weight gain.

## 2018-07-04 NOTE — Patient Outreach (Signed)
[facility]) Care Management  07/04/2018  Shaun May 1951-05-22 [id number]    EMMI-Heart Failure Red on EMMI Alert: Day #31, 33 Date:07/04/2018 Red Alert Reason: Weight (LBS) 199 Weight (LBS) 202 New/Worsening problems? Yes  Patient returned call. Verified HIPAA. All Issues addressed. Patient states that he received a call on 7/26 from cardiology office and was told that he is on the dry side.  He was asked to take 1 torsemide 20 mg daily and 1 potassium 20 meq daily.  Since he has cut down on his medication he has noticed that his weight has increased daily.  Yesterday he weighed 202 and today he is weighing 203.  He denies any chest pain or swelling.  He states that he is more short of breath.  He feels that is may be coming from the increase in temperature.  He states that he is going to call the cardiology office today and notify them.  I advised that would be a good Idea.  I discussed the CHF action plan red zone with the patient and he verbalized understanding. I advised the patient that if I receive any abnormal responses to his calls I will contact him again.  The patient thanked me for calling.    Plan: RN Health Coach will follow up with the patient at the next scheduled interval.  Lazaro Arms RN, BSN, [place]:  [phone]  Fax: [phone]

## 2018-07-04 NOTE — Telephone Encounter (Signed)
Spoke with Pt who c/o 4 lb weight gain since Saturday. Pt denies increase SOB or swelling at this time. No c/o chest pain. Please advise.

## 2018-07-04 NOTE — Telephone Encounter (Signed)
Please call patient regarding medication changes / tg

## 2018-07-05 ENCOUNTER — Other Ambulatory Visit: Payer: Self-pay

## 2018-07-05 NOTE — Telephone Encounter (Signed)
Called work number, told to call his home.called home and phone just rang-cc

## 2018-07-05 NOTE — Telephone Encounter (Signed)
He can take an extra torsemide 20 mg for 3 lb or more weight gain.

## 2018-07-05 NOTE — Telephone Encounter (Signed)
Pt notified , will take extra torsemide as directed

## 2018-07-05 NOTE — Progress Notes (Signed)
Cardiology Office Note    Date:  07/06/2018   ID:  Shaun May, DOB 08/12/1951, MRN [id number]  PCP:  Celene Squibb, MD  Cardiologist: Kate Sable, MD   EP: Dr. Lovena Le  Chief Complaint  Patient presents with  . Follow-up    2 week visit    History of Present Illness:    Shaun May is a 67 y.o. 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  He was examined by Sharrell Ku, PA-C on 06/21/2018 following a recent admission for acute on chronic diastolic CHF. He reported still having significant dyspnea on exertion at that time despite significant diuresis and he still had lower extremity edema as well.  His symptoms were thought to be most consistent with a pulmonary etiology in the setting of his advanced COPD and ongoing tobacco use. He also reported not seeing oncology since his diagnosis, therefore he was referred to Pulmonology and Oncology for further evaluation. Lower extremity Dopplers were obtained and negative for a DVT. Labs were obtained and it was recommended that he increase his Torsemide to 40mg  in AM/20mg  in PM for 3 days then back to 20mg  BID. He was evaluated by Pulmonology the day following his Cardiology visit and was started on a prednisone taper. Repeat labs on 06/29/2018 showed creatinine was slightly elevated to 1.45 and Na+ remained low at 128, therefore Torsemide was reduced to 20 mg once daily.  In talking with the patient today, he reports that his dyspnea did  improve following the use of a steroid taper. Over the past several days, he has developed worsening lower extremity edema and has noticed a 4 pound weight gain on his home scales. He denies any specific orthopnea or PND but utilizes 2 L nasal cannula at night. Oxygen saturations are at 96% on room air today.  Denies any recent chest discomfort or palpitations. He does not check blood pressure regularly but it is well controlled at 136/88 during today's visit.   Past Medical History:  Diagnosis Date  . Alcohol use   . Anemia    when on Xarelto  . Cancer ([place])   . Chronic combined systolic and diastolic CHF (congestive heart failure) ([place])   . CKD (chronic kidney disease), stage II   . COPD (chronic obstructive pulmonary disease) ([facility])    stage 4 COPD  . Dyspnea   . Gastric ulcer   . GI bleeding   . Hematuria   . Hemochromatosis   . Hypertension   . Lung nodule   . Mobitz type 2 second degree heart block    PPM MDT 10/28/15 Dr. Lovena Le  . NICM (nonischemic cardiomyopathy) ([place])   . Paroxysmal atrial flutter ([place])   . Pericardial effusion   . Persistent atrial fibrillation ([place])   . Presence of permanent cardiac pacemaker    [employer]  . Tobacco abuse     Past Surgical History:  Procedure Laterality Date  . BIOPSY  10/13/2016   Procedure: BIOPSY;  Surgeon: Rogene Houston,  MD;  [facility];  Service: Endoscopy;;  esophageal  . CARDIAC CATHETERIZATION N/A 03/31/2016   Procedure: Left Heart Cath and Coronary Angiography;  Surgeon: Wellington Hampshire, MD;  Location: [place] [facility];  Service: Cardiovascular;  Laterality: N/A;  . COLONOSCOPY N/A 10/13/2016   Procedure: COLONOSCOPY;  Surgeon: Rogene Houston, MD;  [facility];  Service: Endoscopy;  Laterality: N/A;  . EP IMPLANTABLE DEVICE N/A 10/28/2015   Procedure: Pacemaker Implant;  Surgeon: Evans Lance, MD;  Location: [place] [facility];  Service: Cardiovascular;  Laterality: N/A;  . EP IMPLANTABLE  DEVICE N/A 11/07/2015   Procedure: Pocket Revision;  Surgeon: Evans Lance, MD;  Location: [place] [facility];  Service: Cardiovascular;  Laterality: N/A;  . ESOPHAGOGASTRODUODENOSCOPY N/A 05/27/2016   Procedure: ESOPHAGOGASTRODUODENOSCOPY (EGD);  Surgeon: Rogene Houston, MD;  [facility];  Service: Endoscopy;  Laterality: N/A;  . ESOPHAGOGASTRODUODENOSCOPY N/A 10/13/2016   Procedure: ESOPHAGOGASTRODUODENOSCOPY (EGD);  Surgeon: Rogene Houston, MD;  [facility];  Service: Endoscopy;  Laterality: N/A;  1:30 - moved to 11/8 @ 1:00 - Ann notified pt  . FUDUCIAL PLACEMENT Right 12/29/2017   Procedure: PLACEMENT OF FUDUCIAL;  Surgeon: [name], MD;  Location: [place];  Service: Thoracic;  Laterality: Right;  . POLYPECTOMY  10/13/2016   Procedure: POLYPECTOMY;  Surgeon: Rogene Houston, MD;  [facility];  Service: Endoscopy;;  transverse colon polypectomy  . TONSILLECTOMY    . VIDEO BRONCHOSCOPY WITH ENDOBRONCHIAL NAVIGATION Right 12/29/2017   Procedure: VIDEO BRONCHOSCOPY WITH ENDOBRONCHIAL NAVIGATION;  Surgeon: [name], MD;  Location: [place];  Service: Thoracic;  Laterality: Right;    Current Medications: Outpatient Medications Prior to Visit  Medication Sig Dispense Refill  . albuterol (PROVENTIL HFA;VENTOLIN HFA) 108 (90 Base) MCG/ACT inhaler Inhale 1-2 puffs into the lungs every 6 (six) hours as needed for wheezing or shortness of breath. 1 Inhaler 0  . aspirin EC 81 MG tablet Take 81 mg by mouth daily.    Marland Kitchen atorvastatin (LIPITOR) 40 MG tablet TAKE ONE TABLET BY MOUTH ONCE DAILY AT 6PM. 30 tablet 6  . carvedilol (COREG) 25 MG tablet Take 25 mg by mouth 2 (two) times daily with a meal.   11  . Dextromethorphan-Guaifenesin (MUCINEX DM MAXIMUM STRENGTH) 60-1200 MG TB12 Take 1 tablet by mouth daily.    . Fluticasone-Umeclidin-Vilant (TRELEGY ELLIPTA) 100-62.5-25 MCG/INH AEPB Inhale 1 puff into the lungs daily. 60 each 11  . LORazepam  (ATIVAN) 2 MG tablet Take 2 mg by mouth 2 (two) times daily.     . Magnesium 250 MG TABS Take 500 mg by mouth daily.    Marland Kitchen oxyCODONE-acetaminophen (PERCOCET/ROXICET) 5-325 MG tablet Take 1 tablet by mouth 2 (two) times daily. *May take one tablet three times daily as needed for pain    . OXYGEN Inhale 2 L into the lungs at bedtime. **2L with sleep and exertion if needed    . pantoprazole (PROTONIX) 40 MG tablet TAKE 1 TABLET BY MOUTH DAILY BEFORE BREAKFAST. 90 tablet 3  . Phenyleph-Doxylamine-DM-APAP (NYQUIL SEVERE COLD/FLU PO) Take 1 Dose by mouth at bedtime.    . potassium chloride SA (K-DUR,KLOR-CON) 20 MEQ tablet Take 1 tablet (20 mEq total) by mouth daily. 90 tablet 3  . rOPINIRole (REQUIP) 0.25 MG tablet Take 0.25-0.75 mg by mouth at bedtime.   1  . sodium chloride (OCEAN) 0.65 % SOLN nasal spray Place 1 spray into both nostrils  as needed for congestion.    . torsemide (DEMADEX) 20 MG tablet Take 1 tablet (20 mg total) by mouth daily. May taken additional 20 mg for swelling 90 tablet 3  . Magnesium 400 MG TABS Take 400 mg by mouth daily. 90 tablet 3  . predniSONE (DELTASONE) 10 MG tablet Take  4 each am x 2 days,   2 each am x 2 days,  1 each am x 2 days and stop 14 tablet 0   No facility-administered medications prior to visit.      Allergies:   Xarelto [rivaroxaban] and Eliquis [apixaban]   Social History   Socioeconomic History  . Marital status: Divorced    Spouse name: Not on file  . Number of children: Not on file  . Years of education: Not on file  . Highest education level: Not on file  Occupational History  . Not on file  Social Needs  . Financial resource strain: Not on file  . Food insecurity:    Worry: Not on file    Inability: Not on file  . Transportation needs:    Medical: Not on file    Non-medical: Not on file  Tobacco Use  . Smoking status: Current Every Day Smoker    Packs/day: 2.00    Years: 56.00    Pack years: 112.00    Types: E-cigarettes,  Cigarettes  . Smokeless tobacco: Never Used  Substance and Sexual Activity  . Alcohol use: Yes    Alcohol/week: 1.8 oz    Types: 3 Shots of liquor per week    Comment: nightly  . Drug use: Never  . Sexual activity: Yes    Birth control/protection: None  Lifestyle  . Physical activity:    Days per week: Not on file    Minutes per session: Not on file  . Stress: Not on file  Relationships  . Social connections:    Talks on phone: Not on file    Gets together: Not on file    Attends religious service: Not on file    Active member of club or organization: Not on file    Attends meetings of clubs or organizations: Not on file    Relationship status: Not on file  Other Topics Concern  . Not on file  Social History Narrative  . Not on file     Family History:  The patient's family history includes Dementia in his mother; Diabetes in his maternal grandmother and other.   Review of Systems:   Please see the history of present illness.     General:  No chills, fever, night sweats or weight changes.  Cardiovascular:  No chest pain, orthopnea, palpitations, paroxysmal nocturnal dyspnea. Positive for edema and dyspnea on exertion.  Dermatological: No rash, lesions/masses Respiratory: No cough, dyspnea Urologic: No hematuria, dysuria Abdominal:   No nausea, vomiting, diarrhea, bright red blood per rectum, melena, or hematemesis Neurologic:  No visual changes, wkns, changes in mental status. All other systems reviewed and are otherwise negative except as noted above.   Physical Exam:    VS:  BP 136/88   Pulse 60   Ht 5\' 10"  (1.778 m)   Wt 209 lb 3.2 oz (94.9 kg)   SpO2 96% Comment: on room air  BMI 30.02 kg/m    General: Well developed, well nourished Caucasian male appearing in no acute distress. Head: Normocephalic, atraumatic, sclera non-icteric, no xanthomas, nares are without discharge.  Neck: No carotid bruits. JVD not elevated.  Lungs:  Respirations regular and  unlabored, without wheezes or rales.  Heart: Regular rate and rhythm. No S3 or S4.  No murmur, no rubs, or gallops appreciated. Abdomen: Soft, non-tender, non-distended with normoactive bowel sounds. No hepatomegaly. No rebound/guarding. No obvious abdominal masses. Msk:  Strength and tone appear normal for age. No joint deformities or effusions. Extremities: No clubbing or cyanosis. 1+ pitting edema bilaterally.  Distal pedal pulses are 2+ bilaterally. Neuro: Alert and oriented X 3. Moves all extremities spontaneously. No focal deficits noted. Psych:  Responds to questions appropriately with a normal affect. Skin: No rashes or lesions noted  Wt Readings from Last 3 Encounters:  07/06/18 209 lb 3.2 oz (94.9 kg)  06/22/18 201 lb (91.2 kg)  06/21/18 204 lb (92.5 kg)    Studies/Labs Reviewed:   EKG:  EKG is not ordered today.    Recent Labs: 05/17/2018: ALT 20 05/29/2018: Magnesium 1.9 06/21/2018: B Natriuretic Peptide 420.0; Hemoglobin 11.0; Platelets 186 06/29/2018: BUN 32; Creatinine, Ser 1.45; Potassium 4.0; Sodium 128   Lipid Panel No results found for: CHOL, TRIG, HDL, CHOLHDL, VLDL, LDLCALC, LDLDIRECT  Additional studies/ records that were reviewed today include:   Echocardiogram: 05/26/2018 Study Conclusions  - Left ventricle: The cavity size was normal. There was moderate   concentric hypertrophy. Systolic function was normal. The   estimated ejection fraction was in the range of 60% to 65%. Wall   motion was normal; there were no regional wall motion   abnormalities. Features are consistent with a pseudonormal left   ventricular filling pattern, with concomitant abnormal relaxation   and increased filling pressure (grade 2 diastolic dysfunction).   Doppler parameters are consistent with high ventricular filling   pressure. - Aortic valve: Mildly calcified annulus. Trileaflet. - Mitral valve: Mildly calcified annulus. Normal thickness leaflets   . - Right ventricle:  Systolic function was mildly reduced. - Pericardium, extracardiac: There was a small to moderate size   pericardial effusion without evidence for tamponade physiology.  Assessment:    1. Chronic combined systolic and diastolic CHF (congestive heart failure) ([place])   2. Lower extremity edema   3. Pericardial effusion   4. Chronic obstructive pulmonary disease, unspecified COPD type ([place])   5. Non-small cell lung cancer, unspecified laterality ([place])   6. PAF (paroxysmal atrial fibrillation) (H[facility])   7. Cardiac pacemaker in situ   8. Tobacco use      Plan:   In order of problems listed above:  1. Chronic Combined Systolic and Diastolic CHF/ NICM/ Edema - EF 35-40% by echo in 03/2016 with cath showing nonobstructive CAD with EF improved to 60-65% by echo in 05/2018. He has chronic dyspnea on exertion which is multifactorial in the setting of CHF, COPD, and lung cancer. Respiratory symptoms recently improved with a steroid taper but he developed worsening edema. Has also noticed a 4 lb weight gain on his home scales and he denies excessive food consumption while on Prednisone (has finished full course of steroid treatment at this time).  - lungs are clear on examination today but he does have 1+ pitting edema bilaterally. Will ask for him to take Torsemide 40mg  daily with additional K+ supplementation for the next 4 days then resume at 20mg  daily. He is aware to take an extra 20mg  following this for worsening edema or weight gain > 3 lbs overnight or > 5 lbs in one week.   2. Pericardial Effusion - small to moderate by echo in 05/2018. Denies any symptoms consistent with pericarditis at  this time. Continue with diuretic therapy as outlined above.   3. COPD/ Chronic Hypoxic Respiratory Failure/ Lung Cancer - COPD followed by Pulmonology. Has chronic dyspnea but symptoms did improve recently following a steroid taper for radiation pneumonitis. He uses 2L [place] at night and intermittently throughout  the day. Does have NSCLC which is being followed by Radiation Oncology. Will re-enter referral for Oncology as previously recommended.   4. Paroxysmal Atrial Fibrillation - he denies any recent palpitations. HR well-controlled in the 60's during today's visit. Continue Coreg 25mg  BID for rate-control. No longer on anticoagulation given history of GIB and hematuria with anticoagulation in the past. Remains on ASA 81mg  daily.   5. 2nd Degree Heart Block - s/p PPM placement in 2016. Interrogation in 04/2018 showed normal device function.  - followed by Dr. Lovena Le.   6. Continued Tobacco Use - he has continued to reduce his tobacco use. Congratulated on reduction with complete cessation advised.    Medication Adjustments/Labs and Tests Ordered: Current medicines are reviewed at length with the patient today.  Concerns regarding medicines are outlined above.  Medication changes, Labs and Tests ordered today are listed in the Patient Instructions below. Patient Instructions  Medication Instructions:  Your physician has recommended you make the following change in your medication:  Take Torsemide 40 mg Daily for 4 Days then resume 20 mg daily  Take Potassium 40 meq Daily for 4 Days then resume 20 meq daily    Labwork: NONE   Testing/Procedures: NONE   Follow-Up: Your physician recommends that you schedule a follow-up appointment in: 2-3 months.  You have been referred to Oncology   Any Other Special Instructions Will Be Listed Below (If Applicable).  If you need a refill on your cardiac medications before your next appointment, please call your pharmacy.    Signed, Erma Heritage, PA-C  07/06/2018 8:50 PM    [facility] [address] [place], [place] [address] Phone: [phone]

## 2018-07-05 NOTE — Patient Outreach (Signed)
[facility]) Care Management  07/05/2018  Shaun May 04/23/1951 [id number]    EMMI-Heart Failure Red on EMMI Alert: Day #34 Date:07/04/2018 Red Alert Reason: Weight (LBS) 203  New/Worsening problems? Yes   Red on EMMI dashboard received. No outreach call warranted to patient at this time. RN CM Health Coach addressed issue on previous call.  Per chart review the patient contacted the physician office for weight gain and was told to take an extra torsemide 20 mg for 3 lb or more weight gain.    Plan:  RN Health Coach will contact the patient at the next scheduled interval.  Lazaro Arms RN, BSN, [place]:  [phone]  Fax: [phone]

## 2018-07-06 ENCOUNTER — Encounter: Payer: Self-pay | Admitting: Student

## 2018-07-06 ENCOUNTER — Encounter (HOSPITAL_COMMUNITY): Payer: Self-pay | Admitting: *Deleted

## 2018-07-06 ENCOUNTER — Other Ambulatory Visit: Payer: Self-pay

## 2018-07-06 ENCOUNTER — Other Ambulatory Visit (HOSPITAL_COMMUNITY): Payer: Self-pay | Admitting: *Deleted

## 2018-07-06 ENCOUNTER — Ambulatory Visit (INDEPENDENT_AMBULATORY_CARE_PROVIDER_SITE_OTHER): Payer: Medicare Other | Admitting: Student

## 2018-07-06 VITALS — BP 136/88 | HR 60 | Ht 70.0 in | Wt 209.2 lb

## 2018-07-06 DIAGNOSIS — I3139 Other pericardial effusion (noninflammatory): Secondary | ICD-10-CM

## 2018-07-06 DIAGNOSIS — I313 Pericardial effusion (noninflammatory): Secondary | ICD-10-CM | POA: Diagnosis not present

## 2018-07-06 DIAGNOSIS — R600 Localized edema: Secondary | ICD-10-CM

## 2018-07-06 DIAGNOSIS — Z720 Tobacco use: Secondary | ICD-10-CM

## 2018-07-06 DIAGNOSIS — I2510 Atherosclerotic heart disease of native coronary artery without angina pectoris: Secondary | ICD-10-CM

## 2018-07-06 DIAGNOSIS — Z950 Presence of cardiac pacemaker: Secondary | ICD-10-CM | POA: Diagnosis not present

## 2018-07-06 DIAGNOSIS — C3490 Malignant neoplasm of unspecified part of unspecified bronchus or lung: Secondary | ICD-10-CM | POA: Diagnosis not present

## 2018-07-06 DIAGNOSIS — I5042 Chronic combined systolic (congestive) and diastolic (congestive) heart failure: Secondary | ICD-10-CM | POA: Diagnosis not present

## 2018-07-06 DIAGNOSIS — I2584 Coronary atherosclerosis due to calcified coronary lesion: Secondary | ICD-10-CM

## 2018-07-06 DIAGNOSIS — J449 Chronic obstructive pulmonary disease, unspecified: Secondary | ICD-10-CM | POA: Diagnosis not present

## 2018-07-06 DIAGNOSIS — R911 Solitary pulmonary nodule: Secondary | ICD-10-CM

## 2018-07-06 DIAGNOSIS — I480 Paroxysmal atrial fibrillation: Secondary | ICD-10-CM

## 2018-07-06 NOTE — Patient Instructions (Addendum)
Medication Instructions:  Your physician has recommended you make the following change in your medication:  Take Torsemide 40 mg Daily for 4 Days then resume 20 mg daily  Take Potassium 40 meq Daily for 4 Days then resume 20 meq daily    Labwork: NONE   Testing/Procedures: NONE   Follow-Up: Your physician recommends that you schedule a follow-up appointment in: 2-3 months.  You have been referred to Oncology    Any Other Special Instructions Will Be Listed Below (If Applicable).     If you need a refill on your cardiac medications before your next appointment, please call your pharmacy.

## 2018-07-06 NOTE — Progress Notes (Signed)
Patient is aware of appointments.

## 2018-07-06 NOTE — Patient Outreach (Signed)
[facility]) Care Management  07/06/2018  Shaun May 22-Sep-1951 [id number]    EMMI-Heart Failure Red on EMMI Alert: Day #35 Date:07/06/2018 Red Alert Reason: Weight (LBS) 203  New/Worsening problems? Yes New/Worsening shortness of breath? Yes  Outreach attempt # 1 to the patient. No answer.  No answering machine pickup.  Unable to leave a message.   Plan: RN Health Coach will send letter. RN Health Coach will make another attempt to the patient within four business days.  Lazaro Arms RN, BSN, [place] Direct Dial:  [phone]  Fax: [phone]

## 2018-07-06 NOTE — Progress Notes (Signed)
Patient referred to our office by his cardiologist.  Patient is a patient in [place] where he was worked up and treated for lung cancer earlier this year.  I called and spoke with patient and he wants to have follow-up here in [place].  I spoke with Dr. Delton Coombes and he wants patient to have a restaging PET scan done prior to coming to our office.    I will call him with appointments.

## 2018-07-07 ENCOUNTER — Ambulatory Visit: Payer: Medicare Other

## 2018-07-07 ENCOUNTER — Encounter (HOSPITAL_COMMUNITY): Payer: Self-pay | Admitting: *Deleted

## 2018-07-07 NOTE — Progress Notes (Signed)
Patient called Shaun May today to advise that he doesn't want to have the PET scan done and doesn't want to come in to see the physician.  He did not want to reschedule.

## 2018-07-10 ENCOUNTER — Other Ambulatory Visit: Payer: Self-pay | Admitting: *Deleted

## 2018-07-10 NOTE — Patient Outreach (Signed)
[facility]) Care Management  07/10/2018  DONEVIN SAINSBURY 1951/10/31 [id number]   EMMI Alert Follow Up  EMMI:  Heart Failure Red on EMMI Alert EMMI Day:  #39 Date:  07/09/2018 Red Alert Reason:  New/worsening problems? Yes and New/worsening shortness of breath? Yes   Outreach Attempt:  Successful telephone outreach to patient for EMMI Heart failure Red Alert.  Patient flagged Red Alert on 07/09/2018 for New/worsening problems? Yes and New/worsening shortness of breath? Yes.  HIPAA verified with patient.  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  Reports he currently has home oxygen supplied by [employer] and wears it nightly but feels he will need portable tanks to be able to use throughout the day while working.  Weight this morning was 202 pounds.  Endorses swelling in his legs, abdomen, and scrotum.  Reports he is taking his extra dose of Demadex daily (Demadex 20 mg twice a day currently).  Continues to smoke and reports using his rescue nebulizer about 4-5 times a week and daily compliance with his maintenance inhaler.  Patient encouraged to contact Primary Care provider, Dr. [place] Crane or Pulmonologist, Dr. Melvyn Novas to discuss increased shortness of breath and possibility of home oxygen.  Appointments:  Patient attended Cardiology appointment on 07/06/2018 where he was instructed to increase his Demadex (which he is doing).  Reports he has scheduled appointment with Dr. [place] Crane on 08/02/2018 and Dr. Melvyn Novas, Pulmonology on 08/04/2018.  Encouraged to contact Pulmonology to schedule sooner appointment and discuss need for home oxygen with activity.  Patient stating he will call Pulmonologist today.  Plan:  RN Health Coach will continue to monitor EMM Heart Failure Red Alerts.  [place] [phone] [email or web address]

## 2018-07-12 ENCOUNTER — Other Ambulatory Visit: Payer: Self-pay

## 2018-07-12 NOTE — Patient Outreach (Signed)
[facility]) Care Management  07/12/2018   Shaun May 1951-07-23 [id number]  Subjective: Telephone call placed to the patient for assessment.  HIPAA verified.  The patient states that he is still short of breath.  He reports the he has not call Dr Gustavus Bryant office to see about getting his appointment moved up.  I advised the patient to call.  He verbalized understanding.  His weight today was 202 lbs.  He states that he is a pound down from yesterday.  He denies any pain or falls.  He states that he is adherent with his medications.   He states that he is following the salt restriction he is on but may have a little more that 48 oz of fluid.   The patient states that he haas noticed that he is having weeping from his legs.  I advised the patient to call his PCP and notify him of the situation.  He verbalized understanding.  The patient has his next appointment with Dr Melvyn Novas scheduled for 8/30 and Dr Bronson Ing on 11/6.   Current Medications:  Current Outpatient Medications  Medication Sig Dispense Refill  . albuterol (PROVENTIL HFA;VENTOLIN HFA) 108 (90 Base) MCG/ACT inhaler Inhale 1-2 puffs into the lungs every 6 (six) hours as needed for wheezing or shortness of breath. 1 Inhaler 0  . aspirin EC 81 MG tablet Take 81 mg by mouth daily.    Marland Kitchen atorvastatin (LIPITOR) 40 MG tablet TAKE ONE TABLET BY MOUTH ONCE DAILY AT 6PM. 30 tablet 6  . carvedilol (COREG) 25 MG tablet Take 25 mg by mouth 2 (two) times daily with a meal.   11  . Dextromethorphan-Guaifenesin (MUCINEX DM MAXIMUM STRENGTH) 60-1200 MG TB12 Take 1 tablet by mouth daily.    . Fluticasone-Umeclidin-Vilant (TRELEGY ELLIPTA) 100-62.5-25 MCG/INH AEPB Inhale 1 puff into the lungs daily. 60 each 11  . LORazepam (ATIVAN) 2 MG tablet Take 2 mg by mouth 2 (two) times daily.     . Magnesium 250 MG TABS Take 500 mg by mouth daily.    Marland Kitchen oxyCODONE-acetaminophen (PERCOCET/ROXICET) 5-325 MG tablet Take 1 tablet by mouth 2 (two) times  daily. *May take one tablet three times daily as needed for pain    . OXYGEN Inhale 2 L into the lungs at bedtime. **2L with sleep and exertion if needed    . pantoprazole (PROTONIX) 40 MG tablet TAKE 1 TABLET BY MOUTH DAILY BEFORE BREAKFAST. 90 tablet 3  . potassium chloride SA (K-DUR,KLOR-CON) 20 MEQ tablet Take 1 tablet (20 mEq total) by mouth daily. 90 tablet 3  . rOPINIRole (REQUIP) 0.25 MG tablet Take 0.25-0.75 mg by mouth at bedtime.   1  . sodium chloride (OCEAN) 0.65 % SOLN nasal spray Place 1 spray into both nostrils as needed for congestion.    . torsemide (DEMADEX) 20 MG tablet Take 1 tablet (20 mg total) by mouth daily. May taken additional 20 mg for swelling 90 tablet 3  . Phenyleph-Doxylamine-DM-APAP (NYQUIL SEVERE COLD/FLU PO) Take 1 Dose by mouth at bedtime.     No current facility-administered medications for this visit.     Functional Status:  In your present state of health, do you have any difficulty performing the following activities: 05/27/2018 05/27/2018  Hearing? N N  Vision? N N  Difficulty concentrating or making decisions? N N  Walking or climbing stairs? Y N  Dressing or bathing? N N  Doing errands, shopping? N -  Some recent data might be hidden  Fall/Depression Screening: Fall Risk  06/06/2018 05/23/2018 03/07/2018  Falls in the past year? No No No   PHQ 2/9 Scores 06/06/2018 06/02/2018 05/23/2018 03/07/2018 01/06/2018  PHQ - 2 Score 2 0 0 0 0  PHQ- 9 Score 7 - - - -    Assessment: Patient will continue to benefit from health coach outreach for disease management and support.  THN CM Care Plan Problem One     Most Recent Value  THN Long Term Goal   IN 90 days the patient will verbalize that he has remained hospital free  [place] Term Goal Start Date  07/12/18  Interventions for Problem One Long Term Goal  Reviewed signs and symptoms of heart failure, discussed with patient chf action plan  THN CM Short Term Goal #1   In 30 days the patient will verbalize  that he is weighing daily and recording his values  THN CM Short Term Goal #1 Start Date  07/12/18  [facility] CM Short Term Goal #1 Met Date  07/12/18       Plan: [place] will contact patient in the month of September and patient agrees to next outreach.  Lazaro Arms RN, BSN, [place] Direct Dial:  [phone]  Fax: [phone]

## 2018-07-13 ENCOUNTER — Telehealth: Payer: Self-pay | Admitting: Student

## 2018-07-13 NOTE — Telephone Encounter (Signed)
Pt states he "dabbed around my ankles with a kleenex" and it felt wet.I encouraged him to wear compression stockings but he said he doesn't like them.I also encouraged him to elevate legs when ever he sits. He denied redness, swelling, or foul discharge from wet area. I encouraged him to f/u with his pcp if things change, he agreed

## 2018-07-13 NOTE — Telephone Encounter (Signed)
Patient states that he has "fluid coming out of legs". Wants "recommendation". / tg

## 2018-07-14 ENCOUNTER — Other Ambulatory Visit: Payer: Self-pay

## 2018-07-14 NOTE — Patient Outreach (Signed)
[facility]) Care Management  07/14/2018  Shaun May [birth year] [id number]    EMMI-Heart Failure Red on EMMI Alert: Day #43 Date:07/13/2018 Red Alert Reason: Weight (LBS) 204   Any new problems? Yes New/Worsening problems? Yes Other symptoms/problems? Yes Know why to take meds? No  Outreach attempt # 1 to the patient. HIPAA verified by the patient.  All issues addressed. The patient states that his weight yesterday was 204.  Today he states that his weight is 205. Reviewed signs and symptoms of heart failure, discussed with patient CHF action plan.   He verbalized understanding.  The patient states that he is still having problems with shortness of breath and feels that it maybe do to the heat.  He denies any chest pain.  He is having swelling in his feet and ankles.  I verified with patient correct dose of fluid pills he should be taking from his last appointment and instructions given by provider for weight gain, encouraged medication compliance. Patient verbalized understanding.  He states that he ha an appointment with Dr Nevada Crane on 8/12 to talk about his legs and swelling.  He reports that he is not sure of why he is taking his meds and what they are for.  I put in a referral with pharmacy.  Explained to the patient that if I receive any abnormal answers to the questions he receives that I will call back.  The patient states that he is fine with that and appreciates the calls.  Plan: RN Health Coach will call the patient at the next scheduled interval.  Lazaro Arms RN, BSN, [place] Direct Dial:  [phone]  Fax: [phone]

## 2018-07-17 ENCOUNTER — Other Ambulatory Visit: Payer: Self-pay

## 2018-07-17 DIAGNOSIS — I5032 Chronic diastolic (congestive) heart failure: Secondary | ICD-10-CM | POA: Diagnosis not present

## 2018-07-17 DIAGNOSIS — J449 Chronic obstructive pulmonary disease, unspecified: Secondary | ICD-10-CM | POA: Diagnosis not present

## 2018-07-17 DIAGNOSIS — Z712 Person consulting for explanation of examination or test findings: Secondary | ICD-10-CM | POA: Diagnosis not present

## 2018-07-17 DIAGNOSIS — Z6827 Body mass index (BMI) 27.0-27.9, adult: Secondary | ICD-10-CM | POA: Diagnosis not present

## 2018-07-17 DIAGNOSIS — R918 Other nonspecific abnormal finding of lung field: Secondary | ICD-10-CM | POA: Diagnosis not present

## 2018-07-17 DIAGNOSIS — R600 Localized edema: Secondary | ICD-10-CM | POA: Diagnosis not present

## 2018-07-17 NOTE — Patient Outreach (Signed)
[facility]) Care Management  07/17/2018  Shaun May 10-07-1951 [id number]  67 year old male referred to [facility].  [place] services requested for medication management -- discuss indications of his  medications. Marland Kitchen  PMHx includes, but not limited to, second degree heart block, hypertension, systolic heart failure, atrial flutter, COPD and smoker.  Successful outreach to Shaun May.  HIPAA identifiers verified.   Medication Management: Referral from [facility] call follow up.  Shaun May reports that he is "not sure what my medications are for."  Shaun May was able to verbalized what his medications were being used for with the exception of atorvastatin.   I reviewed all of his medications with him twice and asked him to write down the indications on a piece of paper.  He verbalized that he did.  Discussed side effects and informed him not to purchase OTC medications without speaking to a pharmacist.  We also discussed smoking cessation.  He states that he is trying to quit, but has been unsuccessful.  He reports trying Chantix in the past, but stopped due to side effects.  He states he does not want to try the Nicotrol Inhaler that was previously prescribed because of side effects his friends experienced using it.  He states that he tried nicotine patches in the hospital, but they "didn't seem to help."  Encouraged him to try nicotine gum and to speak with his doctor at his next appointment.  He states that he is seeing Shaun May soon and hopes to received daytime oxygen because he "really needs it."  Patient states that he has no further medication questions or concerns.  He recorded my phone number and he is aware that he can call me in the future should medication issues arise.   Will close [place] case.   Plan: Route note and discipline closure letter to PCP, Shaun May.  Joetta Manners, PharmD Clinical Pharmacist [place] [phone]

## 2018-07-17 NOTE — Patient Outreach (Signed)
[name]) Care Management  07/17/2018  Shaun May 28-Mar-1951 [id number]    EMMI- Red on EMMI Alert: Day # 92 Date:07/14/2018 Red Alert Reason: New/worsening problems? Yes Ne swelling? Yes New/worsening shortness of breath? Yes   Outreach attempt # 1 to patient   [place]ent[place] man answered the phone and stated that the patient wa not there.  I left a HIPAA compliant message with contact information.    Plan: RN Health Coach will send letter. RN Health Coach will make another attempt to the patient within four business days.  Lazaro Arms RN, BSN, [place] Direct Dial:  [phone] Fax: [phone]

## 2018-07-18 ENCOUNTER — Other Ambulatory Visit: Payer: Self-pay

## 2018-07-18 NOTE — Patient Outreach (Signed)
[facility]) Care Management  07/18/2018  Shaun May [birth year] [id number]    EMMI- Red on EMMI Alert: Day # 35 Date:07/14/2018 Red Alert Reason: New/worsening problems? Yes  New swelling? Yes  New/worsening shortness of breath? Yes  Patient returned call .  HIPAA verified.  All issues address.  The patient states that he haas no new/worsening problems. He states that he was seen at Dr Juel Burrow office yesterday for weeping of his legs.  He was told to take 2 of his fluid pills for four days as well as potassium then stop and go back to his original prescription. The patient reports that he was checked for his breathing and was approved to receive  Mobile oxygen unit.  His weight today was 202 in the home same as yesterday. Reviewed the Heart failure action plan with the patient and he verbalized understanding.  Patient reminded if I receive an abnormal response to calls he receives that I will contact him again.  The patient thanked me for the call.  Plan:  RN Health coach will outreach the patient at the next scheduled interval.  Lazaro Arms RN, BSN, [place] Direct Dial:  [phone]  Fax: [phone]

## 2018-07-19 ENCOUNTER — Encounter (HOSPITAL_COMMUNITY): Payer: Medicare Other

## 2018-07-19 ENCOUNTER — Ambulatory Visit: Payer: Medicare Other

## 2018-07-20 ENCOUNTER — Ambulatory Visit (HOSPITAL_COMMUNITY): Payer: Medicare Other | Admitting: Hematology

## 2018-07-26 DIAGNOSIS — I5032 Chronic diastolic (congestive) heart failure: Secondary | ICD-10-CM | POA: Diagnosis not present

## 2018-07-26 DIAGNOSIS — C3490 Malignant neoplasm of unspecified part of unspecified bronchus or lung: Secondary | ICD-10-CM | POA: Diagnosis not present

## 2018-07-26 DIAGNOSIS — Z6825 Body mass index (BMI) 25.0-25.9, adult: Secondary | ICD-10-CM | POA: Diagnosis not present

## 2018-07-26 DIAGNOSIS — R0602 Shortness of breath: Secondary | ICD-10-CM | POA: Diagnosis not present

## 2018-07-26 DIAGNOSIS — R600 Localized edema: Secondary | ICD-10-CM | POA: Diagnosis not present

## 2018-07-26 DIAGNOSIS — L0390 Cellulitis, unspecified: Secondary | ICD-10-CM | POA: Diagnosis not present

## 2018-07-26 DIAGNOSIS — J449 Chronic obstructive pulmonary disease, unspecified: Secondary | ICD-10-CM | POA: Diagnosis not present

## 2018-08-02 DIAGNOSIS — J449 Chronic obstructive pulmonary disease, unspecified: Secondary | ICD-10-CM | POA: Diagnosis not present

## 2018-08-02 DIAGNOSIS — I87312 Chronic venous hypertension (idiopathic) with ulcer of left lower extremity: Secondary | ICD-10-CM | POA: Diagnosis not present

## 2018-08-02 DIAGNOSIS — Z6827 Body mass index (BMI) 27.0-27.9, adult: Secondary | ICD-10-CM | POA: Diagnosis not present

## 2018-08-02 DIAGNOSIS — R600 Localized edema: Secondary | ICD-10-CM | POA: Diagnosis not present

## 2018-08-02 DIAGNOSIS — I5032 Chronic diastolic (congestive) heart failure: Secondary | ICD-10-CM | POA: Diagnosis not present

## 2018-08-03 ENCOUNTER — Other Ambulatory Visit: Payer: Self-pay

## 2018-08-03 ENCOUNTER — Telehealth (HOSPITAL_COMMUNITY): Payer: Self-pay | Admitting: Physical Therapy

## 2018-08-03 ENCOUNTER — Ambulatory Visit (INDEPENDENT_AMBULATORY_CARE_PROVIDER_SITE_OTHER): Payer: Medicare Other | Admitting: *Deleted

## 2018-08-03 ENCOUNTER — Ambulatory Visit (HOSPITAL_COMMUNITY): Payer: Medicare Other | Attending: Internal Medicine | Admitting: Physical Therapy

## 2018-08-03 DIAGNOSIS — M79604 Pain in right leg: Secondary | ICD-10-CM | POA: Diagnosis not present

## 2018-08-03 DIAGNOSIS — R600 Localized edema: Secondary | ICD-10-CM | POA: Insufficient documentation

## 2018-08-03 DIAGNOSIS — R262 Difficulty in walking, not elsewhere classified: Secondary | ICD-10-CM | POA: Insufficient documentation

## 2018-08-03 DIAGNOSIS — M79605 Pain in left leg: Secondary | ICD-10-CM | POA: Insufficient documentation

## 2018-08-03 DIAGNOSIS — I442 Atrioventricular block, complete: Secondary | ICD-10-CM

## 2018-08-03 NOTE — Therapy (Signed)
[facility] [address] [place], [place], [address] Phone: [phone]   Fax:  [phone]  Physical Therapy Evaluation  Patient Details  Name: Shaun May MRN: [id number] Date of Birth: [birth year] Referring Provider: Allyn Kenner   Encounter Date: 08/03/2018  PT End of Session - 08/03/18 1719    Visit Number  1    Number of Visits  12    Date for PT Re-Evaluation  09/02/18    PT Start Time  1430    PT Stop Time  1600    PT Time Calculation (min)  90 min    Activity Tolerance  Patient tolerated treatment well    Behavior During Therapy  [name] for tasks assessed/performed       Past Medical History:  Diagnosis Date  . Alcohol use   . Anemia    when on Xarelto  . Cancer ([place])   . Chronic combined systolic and diastolic CHF (congestive heart failure) ([place])   . CKD (chronic kidney disease), stage II   . COPD (chronic obstructive pulmonary disease) (H[facility])    stage 4 COPD  . Dyspnea   . Gastric ulcer   . GI bleeding   . Hematuria   . Hemochromatosis   . Hypertension   . Lung nodule   . Mobitz type 2 second degree heart block    PPM MDT 10/28/15 Dr. Lovena Le  . NICM (nonischemic cardiomyopathy) ([place])   . Paroxysmal atrial flutter ([place])   . Pericardial effusion   . Persistent atrial fibrillation ([place])   . Presence of permanent cardiac pacemaker    [employer]  . Tobacco abuse     Past Surgical History:  Procedure Laterality Date  . BIOPSY  10/13/2016   Procedure: BIOPSY;  Surgeon: Rogene Houston, MD;  [facility];  Service: Endoscopy;;  esophageal  . CARDIAC CATHETERIZATION N/A 03/31/2016   Procedure: Left Heart Cath and Coronary Angiography;  Surgeon: Wellington Hampshire, MD;  Location: [facility] CV LAB;  Service: Cardiovascular;  Laterality: N/A;  . COLONOSCOPY N/A 10/13/2016   Procedure: COLONOSCOPY;  Surgeon: Rogene Houston, MD;  [facility];  Service: Endoscopy;  Laterality: N/A;  . EP IMPLANTABLE  DEVICE N/A 10/28/2015   Procedure: Pacemaker Implant;  Surgeon: Evans Lance, MD;  Location: [place] CV LAB;  Service: Cardiovascular;  Laterality: N/A;  . EP IMPLANTABLE DEVICE N/A 11/07/2015   Procedure: Pocket Revision;  Surgeon: Evans Lance, MD;  Location: [facility] [facility] LAB;  Service: Cardiovascular;  Laterality: N/A;  . ESOPHAGOGASTRODUODENOSCOPY N/A 05/27/2016   Procedure: ESOPHAGOGASTRODUODENOSCOPY (EGD);  Surgeon: Rogene Houston, MD;  [facility];  Service: Endoscopy;  Laterality: N/A;  . ESOPHAGOGASTRODUODENOSCOPY N/A 10/13/2016   Procedure: ESOPHAGOGASTRODUODENOSCOPY (EGD);  Surgeon: Rogene Houston, MD;  [facility];  Service: Endoscopy;  Laterality: N/A;  1:30 - moved to 11/8 @ 1:00 - Ann notified pt  . FUDUCIAL PLACEMENT Right 12/29/2017   Procedure: PLACEMENT OF FUDUCIAL;  Surgeon: [name], MD;  Location: [facility];  Service: Thoracic;  Laterality: Right;  . POLYPECTOMY  10/13/2016   Procedure: POLYPECTOMY;  Surgeon: Rogene Houston, MD;  [facility];  Service: Endoscopy;;  transverse colon polypectomy  . TONSILLECTOMY    . VIDEO BRONCHOSCOPY WITH ENDOBRONCHIAL NAVIGATION Right 12/29/2017   Procedure: VIDEO BRONCHOSCOPY WITH ENDOBRONCHIAL NAVIGATION;  Surgeon: [name], MD;  Location: [facility];  Service: Thoracic;  Laterality: Right;  There were no vitals filed for this visit.   Subjective Assessment - 08/03/18 1447    Subjective  PT states that he was in the hospital the last week of June and ever since he has had swelling and pain in both of his legs.   He states that the left is worst than the right.  He states that the pain interfers with his sleep waking up every hour and a half.  He is having weeping B.  He states that the MD doubled his diuretic medication.      Pertinent History  CHF, COPD,     How long can you walk comfortably?  Pt states he does not walk much primarily due to his breathing.     Patient  Stated Goals  less pain     Currently in Pain?  Yes   Pain 10/10 this morning;    Pain Score  1     Pain Orientation  Right;Left    Pain Descriptors / Indicators  Aching;Tightness;Sharp    Pain Type  Chronic pain    Pain Onset  More than a month ago    Pain Frequency  Intermittent    Aggravating Factors   feet on the floor          [facility] PT Assessment - 08/03/18 0001      Assessment   Medical Diagnosis  B LE weeping wounds with edema i    Referring Provider  Allyn Kenner    Onset Date/Surgical Date  05/30/18    Next MD Visit  not scheduled     Prior Therapy  none      Precautions   Precautions  None      Restrictions   Weight Bearing Restrictions  No      Balance Screen   Has the patient fallen in the past 6 months  No    Has the patient had a decrease in activity level because of a fear of falling?   Yes    Is the patient reluctant to leave their home because of a fear of falling?   No      Prior Function   Level of Independence  Independent      Cognition   Overall Cognitive Status  Within Functional Limits for tasks assessed        LYMPHEDEMA/ONCOLOGY QUESTIONNAIRE - 08/03/18 1457      Type   Cancer Type  lung      Treatment   Active Radiation Treatment  No    Past Radiation Treatment  Yes   3   Date  01/22/18    Body Site  chest      What other symptoms do you have   Are you Having Heaviness or Tightness  Yes    Are you having Pain  Yes    Are you having pitting edema  Yes    Body Site  leg    Is it Hard or Difficult finding clothes that fit  --   shoes and socks    Do you have infections  No      Lymphedema Stage   Stage  STAGE 2 SPONTANEOUSLY IRREVERSIBLE      Lymphedema Assessments   Lymphedema Assessments  Lower extremities      Right Lower Extremity Lymphedema   At Midpatella/Popliteal Crease  41 cm    30 cm Proximal to Floor at Lateral Plantar Foot  38.2 cm    20 cm Proximal to Floor at  Lateral Plantar Foot  30.5 1    10  cm Proximal to  Floor at Lateral Malleoli  25 cm    Circumference of ankle/heel  34.8 cm.    5 cm Proximal to 1st MTP Joint  26.8 cm    Across MTP Joint  25.3 cm      Left Lower Extremity Lymphedema   At Midpatella/Popliteal Crease  43 cm    30 cm Proximal to Floor at Lateral Plantar Foot  37.3 cm    20 cm Proximal to Floor at Lateral Plantar Foot  28.7 cm    10 cm Proximal to Floor at Lateral Malleoli  24.2 cm    Circumference of ankle/heel  34.7 cm.    5 cm Proximal to 1st MTP Joint  25 cm    Across MTP Joint  25 cm             Objective measurements completed on examination: See above findings.     Wound Therapy - 08/03/18 1701    Subjective  Pt has had weeping wounds on both LE since June.   He is to pick up antibiotics today.      Patient and Family Stated Goals  wounds to heal     Date of Onset  05/25/18    Prior Treatments  self care     Pain Score  1    at night 9/10   Pain Type  Chronic pain    Pain Orientation  Right;Left    Pain Descriptors / Indicators  Aching    Evaluation and Treatment Procedures Explained to Patient/Family  Yes    Evaluation and Treatment Procedures  agreed to    Wound Properties Date First Assessed: 08/03/18 Time First Assessed: 1761 Wound Type: Venous stasis ulcer;Other (Comment) Location: Leg Location Orientation: Right Wound Description (Comments): anterior aspect  Present on Admission: Yes   Dressing Type  None    Dressing Change Frequency  PRN    Site / Wound Assessment  Painful;Red    % Wound base Red or Granulating  80%    % Wound base Yellow/Fibrinous Exudate  20%    Peri-wound Assessment  Edema;Erythema (blanchable);Induration    Wound Length (cm)  4.5 cm    Wound Width (cm)  3.5 cm    Wound Surface Area (cm^2)  15.75 cm^2    Drainage Amount  Moderate    Drainage Description  Serous    Treatment  Cleansed;Other (Comment)   compression dressing using foam and 3 short stretch    Wound Properties Date First Assessed: 08/03/18 Time First  Assessed: 1455 Wound Type: Other (Comment);Venous stasis ulcer Location: Leg Location Orientation: Right;Posterior Wound Description (Comments): posterior aspect  Present on Admission: Yes   Dressing Type  None    Dressing Change Frequency  PRN    Site / Wound Assessment  Painful;Red    % Wound base Red or Granulating  100%    Peri-wound Assessment  Edema;Erythema (blanchable)    Wound Length (cm)  5 cm    Wound Width (cm)  4 cm    Wound Surface Area (cm^2)  20 cm^2    Drainage Amount  Minimal    Drainage Description  Serous    Treatment  Cleansed;Other (Comment)   compression dressing    Wound Properties Date First Assessed: 08/03/18 Time First Assessed: 1510 Wound Type: Venous stasis ulcer;Other (Comment) Location: Leg Location Orientation: Left;Posterior Present on Admission: Yes   Dressing Type  None    Dressing  Changed  New    Site / Wound Assessment  Painful;Red    Wound Length (cm)  7 cm    Wound Width (cm)  5 cm    Wound Surface Area (cm^2)  35 cm^2    Drainage Amount  Moderate    Drainage Description  Serous    Treatment  Cleansed;Other (Comment)    Wound Therapy - Clinical Statement  Shaun May is a 48 male who has had increased edema and weeping from B LE since June.  He has currently been referred to skilled physical therapy.  Pt has increased redness of the entire LE B; he states he is to pick up an antibiotic today.  He has induration B.  It is noted that his legs are cool and there is minimal hair.  The pt also states that his legs cause him more pain when they are up compared to being down.  He has noted spider veins B.  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  The MD was contactes and an ABI was requested, however this message was left on a recorder as there was no answer at the office.  Shaun May will most likely benefit from a modified foam, short stretch dressing using only 3 short strech bandages.  This was  applied B following application of alginate to absorb the current drainage.  Pt will benefit from wound care with compression bandaging 3x a week for 4 weeks.     Wound Therapy - Functional Problem List  Increased pain causing pain with ambulation, difficulty donning and doffing shoes and socks due to edema.     Factors Delaying/Impairing Wound Healing  Infection - systemic/local;Multiple medical problems;Vascular compromise    Hydrotherapy Plan  Debridement;Dressing change;Patient/family education;Other (comment)   manual for edema control and compression bandaging   Wound Therapy - Frequency  3X / week    Wound Therapy - Current Recommendations  PT    Wound Plan  as above     Dressing   alginate followed by kerlix followed by 1/2 " foam and 3 short stretch bandages B.               PT Education - 08/03/18 1536    Education Details  Take dressing off if any increased pain or if they become wet     Person(s) Educated  Patient    Methods  Explanation    Comprehension  Verbalized understanding       PT Short Term Goals - 08/03/18 1721      PT SHORT TERM GOAL #1   Title  B LE to have no weeping to reduce risk of infection.     Time  1    Period  Weeks    Status  Unable to assess    Target Date  08/10/18        PT Long Term Goals - 08/03/18 1721      PT LONG TERM GOAL #1   Title  Pain level in B LE to be no greater than a 2/10 to allow pt to walk for 30 minutes in comfort for work tasks.     Time  4    Period  Weeks    Status  New    Target Date  08/31/18      PT LONG TERM GOAL #2   Title  PT to verbalize the importance of wearing compression everyday    Time  4    Period  Weeks    Status  New      PT LONG TERM GOAL #3   Title  PT wounds to be healed to prevent cellulitis    Time  4    Period  Weeks    Status  New      PT LONG TERM GOAL #4   Title  Edema to be decreased to allow pt to don shoes and socks with ease.     Time  4    Period  Weeks    Status   New             Plan - 08/03/18 1435    Clinical Impression Statement  As above    History and Personal Factors relevant to plan of care:  COPD, CHF,     Clinical Presentation  Evolving    Rehab Potential  Good    PT Frequency  3x / week    PT Treatment/Interventions  ADLs/Self Care Home Management;Manual lymph drainage;Compression bandaging;Manual techniques;Other (comment)   debridement as needed    PT Next Visit Plan  Cleanse area, assess how compression bandaging did for patient, debride and appropriate dressing change.  Manual to decrease induration.        Patient will benefit from skilled therapeutic intervention in order to improve the following deficits and impairments:  Increased edema, Decreased skin integrity, Difficulty walking, Pain  Visit Diagnosis: Difficulty in walking, not elsewhere classified  Pain in right leg  Pain in left leg  Localized edema     Problem List Patient Active Problem List   Diagnosis Date Noted  . CHF exacerbation ([place]) 05/29/2018  . CHF (congestive heart failure) ([place]) 05/27/2018  . Chronic respiratory failure with hypoxia (HCC)/ nocturnal hypoxemia  08/15/2017  . Symptomatic anemia 05/25/2016  . Anemia 05/25/2016  . Hypokalemia 05/25/2016  . Hyponatremia 05/25/2016  . Atrial flutter ([place]), paroxysmal   . Pericardial effusion   . Acute on chronic systolic (congestive) heart failure ([place]) 03/29/2016  . 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 Elevated troponin 03/29/2016  . Restless leg syndrome 03/29/2016  . 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 Chest pain 03/27/2016  . COPD exacerbation ([place]) 03/27/2016  . Hypertension   . Essential hypertension   . Bradycardia 10/25/2015  . Heart block AV second degree 10/25/2015  . Accelerated hypertension 10/25/2015  . Hemochromatosis 10/25/2015  . Cigarette smoker 10/25/2015  . Alcohol dependence ([place]) 10/25/2015  . Second degree AV  block 10/25/2015    Rayetta Humphrey, PT CLT [phone] 08/03/2018, 5:25 PM  [facility] [address] [place], [place], [address] Phone: [phone]   Fax:  [phone]  Name: Shaun May MRN: [id number] Date of Birth: May 19, 1951

## 2018-08-03 NOTE — Progress Notes (Signed)
Remote pacemaker transmission.   

## 2018-08-04 ENCOUNTER — Ambulatory Visit: Payer: Medicare Other | Admitting: Internal Medicine

## 2018-08-08 ENCOUNTER — Encounter (HOSPITAL_COMMUNITY): Payer: Self-pay | Admitting: Physical Therapy

## 2018-08-08 ENCOUNTER — Ambulatory Visit (HOSPITAL_COMMUNITY): Payer: Medicare Other | Attending: Internal Medicine | Admitting: Physical Therapy

## 2018-08-08 DIAGNOSIS — S81801S Unspecified open wound, right lower leg, sequela: Secondary | ICD-10-CM

## 2018-08-08 DIAGNOSIS — R262 Difficulty in walking, not elsewhere classified: Secondary | ICD-10-CM | POA: Diagnosis not present

## 2018-08-08 DIAGNOSIS — R600 Localized edema: Secondary | ICD-10-CM | POA: Diagnosis not present

## 2018-08-08 DIAGNOSIS — M79604 Pain in right leg: Secondary | ICD-10-CM | POA: Diagnosis not present

## 2018-08-08 DIAGNOSIS — S81802S Unspecified open wound, left lower leg, sequela: Secondary | ICD-10-CM | POA: Insufficient documentation

## 2018-08-08 DIAGNOSIS — M79605 Pain in left leg: Secondary | ICD-10-CM | POA: Diagnosis not present

## 2018-08-08 NOTE — Therapy (Signed)
[place], [place], [address] Phone: [phone]   Fax:  [phone]  Wound Care Therapy  Patient Details  Name: Shaun May MRN: [id number] Date of Birth: 1951-01-31 Referring Provider: Allyn Kenner   Encounter Date: 08/08/2018  PT End of Session - 08/08/18 1229    Visit Number  2    Number of Visits  12    Date for PT Re-Evaluation  09/02/18    PT Start Time  1030    PT Stop Time  1130    PT Time Calculation (min)  60 min    Activity Tolerance  Patient tolerated treatment well    Behavior During Therapy  [facility] for tasks assessed/performed       Past Medical History:  Diagnosis Date  . Alcohol use   . Anemia    when on Xarelto  . Cancer ([place])   . Chronic combined systolic and diastolic CHF (congestive heart failure) ([place])   . CKD (chronic kidney disease), stage II   . COPD (chronic obstructive pulmonary disease) (HCC)    stage 4 COPD  . Dyspnea   . Gastric ulcer   . GI bleeding   . Hematuria   . Hemochromatosis   . Hypertension   . Lung nodule   . Mobitz type 2 second degree heart block    PPM MDT 10/28/15 Dr. Lovena Le  . NICM (nonischemic cardiomyopathy) ([place])   . Paroxysmal atrial flutter ([place])   . Pericardial effusion   . Persistent atrial fibrillation ([place])   . Presence of permanent cardiac pacemaker    Medtronic  . Tobacco abuse     Past Surgical History:  Procedure Laterality Date  . BIOPSY  10/13/2016   Procedure: BIOPSY;  Surgeon: Rogene Houston, MD;  [facility];  Service: Endoscopy;;  esophageal  . CARDIAC CATHETERIZATION N/A 03/31/2016   Procedure: Left Heart Cath and Coronary Angiography;  Surgeon: Wellington Hampshire, MD;  Location: [place] [facility];  Service: Cardiovascular;  Laterality: N/A;  . COLONOSCOPY N/A 10/13/2016   Procedure: COLONOSCOPY;  Surgeon: Rogene Houston, MD;  [facility];  Service: Endoscopy;  Laterality: N/A;  . EP IMPLANTABLE DEVICE N/A  10/28/2015   Procedure: Pacemaker Implant;  Surgeon: Evans Lance, MD;  Location: [place] [facility];  Service: Cardiovascular;  Laterality: N/A;  . EP IMPLANTABLE DEVICE N/A 11/07/2015   Procedure: Pocket Revision;  Surgeon: Evans Lance, MD;  Location: [place] [facility] LAB;  Service: Cardiovascular;  Laterality: N/A;  . ESOPHAGOGASTRODUODENOSCOPY N/A 05/27/2016   Procedure: ESOPHAGOGASTRODUODENOSCOPY (EGD);  Surgeon: Rogene Houston, MD;  [facility];  Service: Endoscopy;  Laterality: N/A;  . ESOPHAGOGASTRODUODENOSCOPY N/A 10/13/2016   Procedure: ESOPHAGOGASTRODUODENOSCOPY (EGD);  Surgeon: Rogene Houston, MD;  [facility];  Service: Endoscopy;  Laterality: N/A;  1:30 - moved to 11/8 @ 1:00 - Ann notified pt  . FUDUCIAL PLACEMENT Right 12/29/2017   Procedure: PLACEMENT OF FUDUCIAL;  Surgeon: [name], MD;  Location: [place];  Service: Thoracic;  Laterality: Right;  . POLYPECTOMY  10/13/2016   Procedure: POLYPECTOMY;  Surgeon: Rogene Houston, MD;  [facility];  Service: Endoscopy;;  transverse colon polypectomy  . TONSILLECTOMY    . VIDEO BRONCHOSCOPY WITH ENDOBRONCHIAL NAVIGATION Right 12/29/2017   Procedure: VIDEO BRONCHOSCOPY WITH ENDOBRONCHIAL NAVIGATION;  Surgeon: [name], MD;  Location: [place];  Service: Thoracic;  Laterality: Right;  There were no vitals filed for this visit.              Wound Therapy - 08/08/18 1222    Subjective  Pt states that he did not change his dressings despite them being wet as he forgot therapist told him to.  Pt quit taking his antibiotic due to stomack upset.      Patient and Family Stated Goals  wounds to heal     Date of Onset  05/25/18    Prior Treatments  self care     Pain Score  7    with debridement   Evaluation and Treatment Procedures Explained to Patient/Family  Yes    Evaluation and Treatment Procedures  agreed to    Wound Properties Date First Assessed: 08/03/18 Time  First Assessed: 1975 Wound Type: Venous stasis ulcer;Other (Comment) Location: Leg Location Orientation: Right Wound Description (Comments): anterior aspect  Present on Admission: Yes   Dressing Type  Compression wrap;None    Dressing Changed  Changed    Dressing Change Frequency  PRN    Site / Wound Assessment  Painful;Red    % Wound base Red or Granulating  70%    % Wound base Yellow/Fibrinous Exudate  30%    Peri-wound Assessment  Edema;Erythema (blanchable);Induration    Drainage Amount  Moderate    Drainage Description  Serous;Green;Odor    Treatment  Cleansed;Debridement (Selective)    Wound Properties Date First Assessed: 08/03/18 Time First Assessed: 1455 Wound Type: Other (Comment);Venous stasis ulcer Location: Leg Location Orientation: Right;Posterior Wound Description (Comments): posterior aspect  Present on Admission: Yes   Dressing Type  Compression wrap;None    Dressing Changed  Changed    Dressing Change Frequency  PRN    Site / Wound Assessment  Painful;Red    % Wound base Red or Granulating  75%    % Wound base Yellow/Fibrinous Exudate  25%    Peri-wound Assessment  Edema;Erythema (blanchable)    Drainage Amount  Moderate    Drainage Description  Green;Odor    Treatment  Cleansed;Debridement (Selective);Other (Comment)    Wound Properties Date First Assessed: 08/03/18 Time First Assessed: 1510 Wound Type: Venous stasis ulcer;Other (Comment) Location: Leg Location Orientation: Left;Posterior Present on Admission: Yes   Dressing Type  Compression wrap;None    Dressing Changed  Changed    Site / Wound Assessment  Painful;Red    Drainage Amount  Copious    Drainage Description  Serous;Green;Odor    Treatment  Cleansed;Debridement (Selective);Other (Comment)    Wound Therapy - Clinical Statement  PT comes to department with drainage through his dressings.  PT has stopped taking his antibiotic due to stomach issues.  Wound drainage has increased with decreased granulation,  drainage is green with positive odor.  Explained pt pt he must be on an antibiotic and to call MD if he needs to change to a different antibiotic.  PT and significant other verbalized understanding.  Changed alginate to silverhydrofiber.  Therapist cut new foam and used new short stretch for compression bandaging with multiple layer short stretch.      Wound Therapy - Functional Problem List  Increased pain causing pain with ambulation, difficulty donning and doffing shoes and socks due to edema.     Factors Delaying/Impairing Wound Healing  Infection - systemic/local;Multiple medical problems;Vascular compromise    Hydrotherapy Plan  Debridement;Dressing change;Patient/family education;Other (comment)   manual for edema control and compression bandaging   Wound Therapy - Frequency  3X / week  Wound Therapy - Current Recommendations  PT    Wound Plan  as above     Dressing   silverhydrofiber, alginate, ab paid kerlix followed by 1/2" foam and multilayer short stretch bandages.                 PT Short Term Goals - 08/08/18 1231      PT SHORT TERM GOAL #1   Title  B LE to have no weeping to reduce risk of infection.     Time  1    Period  Weeks    Status  On-going        PT Long Term Goals - 08/08/18 1231      PT LONG TERM GOAL #1   Title  Pain level in B LE to be no greater than a 2/10 to allow pt to walk for 30 minutes in comfort for work tasks.     Time  4    Period  Weeks    Status  On-going      PT LONG TERM GOAL #2   Title  PT to verbalize the importance of wearing compression everyday    Time  4    Period  Weeks    Status  On-going      PT LONG TERM GOAL #3   Title  PT wounds to be healed to prevent cellulitis    Time  4    Period  Weeks    Status  On-going      PT LONG TERM GOAL #4   Title  Edema to be decreased to allow pt to don shoes and socks with ease.     Time  4    Period  Weeks    Status  On-going            Plan - 08/08/18 1230     Clinical Impression Statement  as above     Rehab Potential  Good    PT Frequency  3x / week    PT Treatment/Interventions  ADLs/Self Care Home Management;Manual lymph drainage;Compression bandaging;Manual techniques;Other (comment)   debridement as needed    PT Next Visit Plan  Assess how silvernitrate assisted in controling psudomonus; see if pt is taking antibiotics, debridment and Compression bandaging    Consulted and Agree with Plan of Care  Patient       Patient will benefit from skilled therapeutic intervention in order to improve the following deficits and impairments:  Increased edema, Decreased skin integrity, Difficulty walking, Pain  Visit Diagnosis: Difficulty in walking, not elsewhere classified  Pain in left leg  Pain in right leg  Localized edema  Wound of right leg, sequela  Wound of left leg, sequela     Problem List Patient Active Problem List   Diagnosis Date Noted  . CHF exacerbation ([place]) 05/29/2018  . CHF (congestive heart failure) ([place]) 05/27/2018  . Chronic respiratory failure with hypoxia (HCC)/ nocturnal hypoxemia  08/15/2017  . Symptomatic anemia 05/25/2016  . Anemia 05/25/2016  . Hypokalemia 05/25/2016  . Hyponatremia 05/25/2016  . Atrial flutter ([place]), paroxysmal   . Pericardial effusion   . Acute on chronic systolic (congestive) heart failure ([place]) 03/29/2016  . 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 Elevated troponin 03/29/2016  . Restless leg syndrome 03/29/2016  . Presumed cancer of right upper lobe of lung ([place]) 03/29/2016  . Chest pain 03/27/2016  . COPD exacerbation ([place]) 03/27/2016  . Hypertension   .  Essential hypertension   . Bradycardia 10/25/2015  . Heart block AV second degree 10/25/2015  . Accelerated hypertension 10/25/2015  . Hemochromatosis 10/25/2015  . Cigarette smoker 10/25/2015  . Alcohol dependence ([place]) 10/25/2015  . Second degree AV block 10/25/2015    Rayetta Humphrey, PT  CLT [phone] 08/08/2018, 12:33 PM  [place] [address] [place], [place], [address] Phone: [phone]   Fax:  [phone]  Name: Shaun May MRN: [id number] Date of Birth: Sep 21, 1951

## 2018-08-09 ENCOUNTER — Ambulatory Visit (HOSPITAL_COMMUNITY): Payer: Medicare Other

## 2018-08-09 ENCOUNTER — Other Ambulatory Visit (HOSPITAL_COMMUNITY): Payer: Self-pay | Admitting: Internal Medicine

## 2018-08-09 NOTE — Telephone Encounter (Signed)
Open in error.  Rayetta Humphrey, [place] CLT [phone]

## 2018-08-10 ENCOUNTER — Ambulatory Visit (HOSPITAL_COMMUNITY): Payer: Medicare Other | Admitting: Physical Therapy

## 2018-08-10 ENCOUNTER — Other Ambulatory Visit (HOSPITAL_COMMUNITY): Payer: Self-pay | Admitting: Internal Medicine

## 2018-08-10 DIAGNOSIS — M79604 Pain in right leg: Secondary | ICD-10-CM

## 2018-08-10 DIAGNOSIS — S81801S Unspecified open wound, right lower leg, sequela: Secondary | ICD-10-CM | POA: Diagnosis not present

## 2018-08-10 DIAGNOSIS — S81802S Unspecified open wound, left lower leg, sequela: Secondary | ICD-10-CM | POA: Diagnosis not present

## 2018-08-10 DIAGNOSIS — I739 Peripheral vascular disease, unspecified: Secondary | ICD-10-CM

## 2018-08-10 DIAGNOSIS — M79605 Pain in left leg: Secondary | ICD-10-CM

## 2018-08-10 DIAGNOSIS — R262 Difficulty in walking, not elsewhere classified: Secondary | ICD-10-CM | POA: Diagnosis not present

## 2018-08-10 DIAGNOSIS — R600 Localized edema: Secondary | ICD-10-CM | POA: Diagnosis not present

## 2018-08-10 NOTE — Therapy (Signed)
[place], [place], [address] Phone: [phone]   Fax:  [phone]  Wound Care Therapy  Patient Details  Name: Shaun May MRN: [id number] Date of Birth: 1951/09/15 Referring Provider: Allyn Kenner   Encounter Date: 08/10/2018  PT End of Session - 08/10/18 1610    Visit Number  3    Number of Visits  12    Date for PT Re-Evaluation  09/02/18    PT Start Time  1435    PT Stop Time  1525    PT Time Calculation (min)  50 min    Activity Tolerance  Patient tolerated treatment well    Behavior During Therapy  [facility] for tasks assessed/performed       Past Medical History:  Diagnosis Date  . Alcohol use   . Anemia    when on Xarelto  . Cancer ([place])   . Chronic combined systolic and diastolic CHF (congestive heart failure) ([place])   . CKD (chronic kidney disease), stage II   . COPD (chronic obstructive pulmonary disease) (HCC)    stage 4 COPD  . Dyspnea   . Gastric ulcer   . GI bleeding   . Hematuria   . Hemochromatosis   . Hypertension   . Lung nodule   . Mobitz type 2 second degree heart block    PPM MDT 10/28/15 Dr. Lovena Le  . NICM (nonischemic cardiomyopathy) ([place])   . Paroxysmal atrial flutter ([place])   . Pericardial effusion   . Persistent atrial fibrillation ([place])   . Presence of permanent cardiac pacemaker    [employer]tr[employer]  . Tobacco abuse     Past Surgical History:  Procedure Laterality Date  . BIOPSY  10/13/2016   Procedure: BIOPSY;  Surgeon: Rogene Houston, MD;  [facility];  Service: Endoscopy;;  esophageal  . CARDIAC CATHETERIZATION N/A 03/31/2016   Procedure: Left Heart Cath and Coronary Angiography;  Surgeon: Wellington Hampshire, MD;  Location: [place] CV LAB;  Service: Cardiovascular;  Laterality: N/A;  . COLONOSCOPY N/A 10/13/2016   Procedure: COLONOSCOPY;  Surgeon: Rogene Houston, MD;  [facility];  Service: Endoscopy;  Laterality: N/A;  . EP IMPLANTABLE DEVICE N/A  10/28/2015   Procedure: Pacemaker Implant;  Surgeon: Evans Lance, MD;  Location: [place] [facility] LAB;  Service: Cardiovascular;  Laterality: N/A;  . EP IMPLANTABLE DEVICE N/A 11/07/2015   Procedure: Pocket Revision;  Surgeon: Evans Lance, MD;  Location: [place] CV LAB;  Service: Cardiovascular;  Laterality: N/A;  . ESOPHAGOGASTRODUODENOSCOPY N/A 05/27/2016   Procedure: ESOPHAGOGASTRODUODENOSCOPY (EGD);  Surgeon: Rogene Houston, MD;  [facility];  Service: Endoscopy;  Laterality: N/A;  . ESOPHAGOGASTRODUODENOSCOPY N/A 10/13/2016   Procedure: ESOPHAGOGASTRODUODENOSCOPY (EGD);  Surgeon: Rogene Houston, MD;  [facility];  Service: Endoscopy;  Laterality: N/A;  1:30 - moved to 11/8 @ 1:00 - Ann notified pt  . FUDUCIAL PLACEMENT Right 12/29/2017   Procedure: PLACEMENT OF FUDUCIAL;  Surgeon: [name], MD;  Location: [place];  Service: Thoracic;  Laterality: Right;  . POLYPECTOMY  10/13/2016   Procedure: POLYPECTOMY;  Surgeon: Rogene Houston, MD;  [facility];  Service: Endoscopy;;  transverse colon polypectomy  . TONSILLECTOMY    . VIDEO BRONCHOSCOPY WITH ENDOBRONCHIAL NAVIGATION Right 12/29/2017   Procedure: VIDEO BRONCHOSCOPY WITH ENDOBRONCHIAL NAVIGATION;  Surgeon: [name], MD;  Location: [place];  Service: Thoracic;  Laterality: Right;  There were no vitals filed for this visit.              Wound Therapy - 08/10/18 1546    Subjective  Pt returns with same dressings and reports he resumed his antibiotic and is taking food with it without issues.      Pain Score  0-No pain    Evaluation and Treatment Procedures Explained to Patient/Family  Yes    Wound Properties Date First Assessed: 08/03/18 Time First Assessed: 2426 Wound Type: Venous stasis ulcer;Other (Comment) Location: Leg Location Orientation: Right Wound Description (Comments): anterior aspect  Present on Admission: Yes   Dressing Type  Compression wrap     Dressing Changed  Changed    Dressing Status  Intact;Old drainage    Dressing Change Frequency  PRN    Site / Wound Assessment  Painful;Red    % Wound base Red or Granulating  80%    % Wound base Yellow/Fibrinous Exudate  20%    Peri-wound Assessment  Edema;Erythema (blanchable);Induration    Drainage Amount  Moderate    Drainage Description  Serosanguineous    Treatment  Cleansed;Debridement (Selective)    Wound Properties Date First Assessed: 08/03/18 Time First Assessed: 1455 Wound Type: Other (Comment);Venous stasis ulcer Location: Leg Location Orientation: Right;Posterior Wound Description (Comments): posterior aspect  Present on Admission: Yes   Dressing Type  Compression wrap    Dressing Changed  Changed    Dressing Status  Intact;Old drainage    Dressing Change Frequency  PRN    Site / Wound Assessment  Painful;Red    % Wound base Red or Granulating  80%    % Wound base Yellow/Fibrinous Exudate  20%    Peri-wound Assessment  Edema;Erythema (blanchable)    Drainage Amount  Moderate    Drainage Description  Serosanguineous    Treatment  Cleansed;Debridement (Selective)    Wound Properties Date First Assessed: 08/03/18 Time First Assessed: 1510 Wound Type: Venous stasis ulcer;Other (Comment) Location: Leg Location Orientation: Left;Posterior Present on Admission: Yes   Dressing Type  Compression wrap;None    Dressing Changed  Changed    Dressing Status  Intact;Old drainage    Dressing Change Frequency  PRN    Site / Wound Assessment  Painful;Red    % Wound base Red or Granulating  85%    % Wound base Yellow/Fibrinous Exudate  15%    Drainage Amount  Moderate    Drainage Description  Green;Serosanguineous    Treatment  Cleansed;Debridement (Selective)    Selective Debridement - Location  bilateral LE's    Selective Debridement - Tools Used  Forceps;Scalpel    Selective Debridement - Tissue Removed  slough and devitalized tissue    Wound Therapy - Clinical Statement  Pt returns  with dressings intact.  Forgot to bring extra set of short stretch that was taken home to wash last session.  Cleansed LE"s with noted reduction in drainage and only with slight pseudomonas remaining on Lt posterior LE.  Debrided away slough, devitatlized tissue and continued dressing with silver hydrofiber to absorb drainage. vaseline also used atop of lotion to provide more moisture.  Pt reproted overall comfort with profores this session.  Instructed t wash, hang to dry and roll bandages back to bring to next appt.     Wound Therapy - Functional Problem List  Increased pain causing pain with ambulation, difficulty donning and doffing shoes and socks due to edema.     Factors Delaying/Impairing Wound Healing  Infection - systemic/local;Multiple  medical problems;Vascular compromise    Hydrotherapy Plan  Debridement;Dressing change;Patient/family education;Other (comment)    Wound Therapy - Frequency  3X / week    Wound Therapy - Current Recommendations  PT    Wound Plan  continue appropriate dressings to promote healing environment.  compression bandaging to reduce edema.  Measure weekly for volume and wound size.     Dressing   silverhydrofiber,abd pad kerlix followed by profore                PT Short Term Goals - 08/08/18 1231      PT SHORT TERM GOAL #1   Title  B LE to have no weeping to reduce risk of infection.     Time  1    Period  Weeks    Status  On-going        PT Long Term Goals - 08/08/18 1231      PT LONG TERM GOAL #1   Title  Pain level in B LE to be no greater than a 2/10 to allow pt to walk for 30 minutes in comfort for work tasks.     Time  4    Period  Weeks    Status  On-going      PT LONG TERM GOAL #2   Title  PT to verbalize the importance of wearing compression everyday    Time  4    Period  Weeks    Status  On-going      PT LONG TERM GOAL #3   Title  PT wounds to be healed to prevent cellulitis    Time  4    Period  Weeks    Status  On-going       PT LONG TERM GOAL #4   Title  Edema to be decreased to allow pt to don shoes and socks with ease.     Time  4    Period  Weeks    Status  On-going              Patient will benefit from skilled therapeutic intervention in order to improve the following deficits and impairments:     Visit Diagnosis: Difficulty in walking, not elsewhere classified  Pain in left leg  Pain in right leg  Localized edema  Wound of right leg, sequela  Wound of left leg, sequela     Problem List Patient Active Problem List   Diagnosis Date Noted  . CHF exacerbation ([place]) 05/29/2018  . CHF (congestive heart failure) ([place]) 05/27/2018  . Chronic respiratory failure with hypoxia (HCC)/ nocturnal hypoxemia  08/15/2017  . Symptomatic anemia 05/25/2016  . Anemia 05/25/2016  . Hypokalemia 05/25/2016  . Hyponatremia 05/25/2016  . Atrial flutter ([place]), paroxysmal   . Pericardial effusion   . Acute on chronic systolic (congestive) heart failure ([place]) 03/29/2016  . 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 Elevated troponin 03/29/2016  . Restless leg syndrome 03/29/2016  . 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 Chest pain 03/27/2016  . COPD exacerbation ([place]) 03/27/2016  . Hypertension   . Essential hypertension   . Bradycardia 10/25/2015  . Heart block AV second degree 10/25/2015  . Accelerated hypertension 10/25/2015  . Hemochromatosis 10/25/2015  . Cigarette smoker 10/25/2015  . Alcohol dependence ([place]) 10/25/2015  . Second degree AV block 10/25/2015   Teena Irani, PTA/CLT [phone]  Teena Irani 08/10/2018, 4:11 PM  [place]  [facility] [address] [place], [place], [address] Phone: [phone]   Fax:  [phone]  Name: Shaun May MRN: [id number] Date of Birth: 08-12-1951

## 2018-08-14 ENCOUNTER — Ambulatory Visit (HOSPITAL_COMMUNITY): Payer: Medicare Other | Admitting: Physical Therapy

## 2018-08-14 DIAGNOSIS — S81801S Unspecified open wound, right lower leg, sequela: Secondary | ICD-10-CM | POA: Diagnosis not present

## 2018-08-14 DIAGNOSIS — S81802S Unspecified open wound, left lower leg, sequela: Secondary | ICD-10-CM

## 2018-08-14 DIAGNOSIS — R262 Difficulty in walking, not elsewhere classified: Secondary | ICD-10-CM | POA: Diagnosis not present

## 2018-08-14 DIAGNOSIS — M79605 Pain in left leg: Secondary | ICD-10-CM

## 2018-08-14 DIAGNOSIS — R600 Localized edema: Secondary | ICD-10-CM

## 2018-08-14 DIAGNOSIS — M79604 Pain in right leg: Secondary | ICD-10-CM

## 2018-08-14 NOTE — Therapy (Addendum)
[place], [place], [address] Phone: [phone]   Fax:  [phone]  Wound Care Therapy  Patient Details  Name: Shaun May MRN: [id number] Date of Birth: 05/21/1951 Referring Provider: Allyn Kenner   Encounter Date: 08/14/2018  PT End of Session - 08/14/18 1517    Visit Number  4    Number of Visits  12    Date for PT Re-Evaluation  09/02/18    PT Start Time  1300    PT Stop Time  1358    PT Time Calculation (min)  58 min    Activity Tolerance  Patient tolerated treatment well    Behavior During Therapy  [facility] for tasks assessed/performed       Past Medical History:  Diagnosis Date  . Alcohol use   . Anemia    when on Xarelto  . Cancer ([place])   . Chronic combined systolic and diastolic CHF (congestive heart failure) ([place])   . CKD (chronic kidney disease), stage II   . COPD (chronic obstructive pulmonary disease) (HCC)    stage 4 COPD  . Dyspnea   . Gastric ulcer   . GI bleeding   . Hematuria   . Hemochromatosis   . Hypertension   . Lung nodule   . Mobitz type 2 second degree heart block    PPM MDT 10/28/15 Dr. Lovena Le  . NICM (nonischemic cardiomyopathy) ([place])   . Paroxysmal atrial flutter ([place])   . Pericardial effusion   . Persistent atrial fibrillation ([place])   . Presence of permanent cardiac pacemaker    Medtronic  . Tobacco abuse     Past Surgical History:  Procedure Laterality Date  . BIOPSY  10/13/2016   Procedure: BIOPSY;  Surgeon: Rogene Houston, MD;  [facility];  Service: Endoscopy;;  esophageal  . CARDIAC CATHETERIZATION N/A 03/31/2016   Procedure: Left Heart Cath and Coronary Angiography;  Surgeon: Wellington Hampshire, MD;  Location: [place] [facility];  Service: Cardiovascular;  Laterality: N/A;  . COLONOSCOPY N/A 10/13/2016   Procedure: COLONOSCOPY;  Surgeon: Rogene Houston, MD;  [facility];  Service: Endoscopy;  Laterality: N/A;  . EP IMPLANTABLE DEVICE N/A  10/28/2015   Procedure: Pacemaker Implant;  Surgeon: Evans Lance, MD;  Location: [place] [facility];  Service: Cardiovascular;  Laterality: N/A;  . EP IMPLANTABLE DEVICE N/A 11/07/2015   Procedure: Pocket Revision;  Surgeon: Evans Lance, MD;  Location: [place] CV LAB;  Service: Cardiovascular;  Laterality: N/A;  . ESOPHAGOGASTRODUODENOSCOPY N/A 05/27/2016   Procedure: ESOPHAGOGASTRODUODENOSCOPY (EGD);  Surgeon: Rogene Houston, MD;  [facility];  Service: Endoscopy;  Laterality: N/A;  . ESOPHAGOGASTRODUODENOSCOPY N/A 10/13/2016   Procedure: ESOPHAGOGASTRODUODENOSCOPY (EGD);  Surgeon: Rogene Houston, MD;  [facility];  Service: Endoscopy;  Laterality: N/A;  1:30 - moved to 11/8 @ 1:00 - Ann notified pt  . FUDUCIAL PLACEMENT Right 12/29/2017   Procedure: PLACEMENT OF FUDUCIAL;  Surgeon: [name], MD;  Location: [place];  Service: Thoracic;  Laterality: Right;  . POLYPECTOMY  10/13/2016   Procedure: POLYPECTOMY;  Surgeon: Rogene Houston, MD;  [facility];  Service: Endoscopy;;  transverse colon polypectomy  . TONSILLECTOMY    . VIDEO BRONCHOSCOPY WITH ENDOBRONCHIAL NAVIGATION Right 12/29/2017   Procedure: VIDEO BRONCHOSCOPY WITH ENDOBRONCHIAL NAVIGATION;  Surgeon: [name], MD;  Location: [place];  Service: Thoracic;  Laterality: Right;  There were no vitals filed for this visit.        LYMPHEDEMA/ONCOLOGY QUESTIONNAIRE - 08/14/18 1557      Right Lower Extremity Lymphedema   At Midpatella/Popliteal Crease  41.7 cm    30 cm Proximal to Floor at Lateral Plantar Foot  36 cm    20 cm Proximal to Floor at Lateral Plantar Foot  29.8 1    10  cm Proximal to Floor at Lateral Malleoli  23.3 cm    Circumference of ankle/heel  34.2 cm.    5 cm Proximal to 1st MTP Joint  26 cm    Across MTP Joint  25.2 cm      Left Lower Extremity Lymphedema   At Midpatella/Popliteal Crease  42.6 cm    30 cm Proximal to Floor at Lateral Plantar  Foot  36.5 cm    20 cm Proximal to Floor at Lateral Plantar Foot  28.7 cm    10 cm Proximal to Floor at Lateral Malleoli  22.8 cm    Circumference of ankle/heel  34 cm.    5 cm Proximal to 1st MTP Joint  25 cm    Across MTP Joint  24 cm           Wound Therapy - 08/14/18 1511    Subjective  PT states it is feeling much better, not itching as bad and doesnt think it's draining as much.    Evaluation and Treatment Procedures Explained to Patient/Family  Yes    Wound Properties Date First Assessed: 08/03/18 Time First Assessed: 2423 Wound Type: Venous stasis ulcer;Other (Comment) Location: Leg Location Orientation: Right Wound Description (Comments): anterior aspect  Present on Admission: Yes   Dressing Type  Compression wrap    Dressing Changed  Changed    Dressing Status  Intact;Old drainage    Dressing Change Frequency  PRN    Site / Wound Assessment  Painful;Red    % Wound base Red or Granulating  90%    % Wound base Yellow/Fibrinous Exudate  20%    Peri-wound Assessment  Edema;Erythema (blanchable);Induration    Wound Length (cm)  3 cm    Wound Width (cm)  2.2 cm    Wound Surface Area (cm^2)  6.6 cm^2    Drainage Amount  Scant    Drainage Description  Serosanguineous    Treatment  Cleansed;Debridement (Selective)    Wound Properties Date First Assessed: 08/03/18 Time First Assessed: 1455 Wound Type: Other (Comment);Venous stasis ulcer Location: Leg Location Orientation: Right;Posterior Wound Description (Comments): posterior aspect  Present on Admission: Yes   Dressing Type  Compression wrap    Dressing Changed  Changed    Dressing Status  Intact;Old drainage    Dressing Change Frequency  PRN    Site / Wound Assessment  Painful;Red    % Wound base Red or Granulating  90%    % Wound base Yellow/Fibrinous Exudate  10%    Peri-wound Assessment  Edema;Erythema (blanchable)    Wound Length (cm)  3 cm    Wound Width (cm)  2 cm    Wound Surface Area (cm^2)  6 cm^2    Drainage  Amount  Scant    Drainage Description  Serosanguineous    Treatment  Cleansed;Debridement (Selective)    Wound Properties Date First Assessed: 08/03/18 Time First Assessed: 1510 Wound Type: Venous stasis ulcer;Other (Comment) Location: Leg Location Orientation: Left;Posterior Present on Admission: Yes   Dressing Type  Compression wrap;None    Dressing Changed  New  Dressing Status  Intact;Old drainage    Dressing Change Frequency  PRN    Site / Wound Assessment  Painful;Red    % Wound base Red or Granulating  95%    % Wound base Yellow/Fibrinous Exudate  5%    Drainage Amount  Minimal    Drainage Description  Serosanguineous    Treatment  Cleansed;Debridement (Selective)    Selective Debridement - Location  bilateral LE's    Selective Debridement - Tools Used  Forceps;Scalpel    Selective Debridement - Tissue Removed  slough and devitalized tissue    Wound Therapy - Clinical Statement  Pt returns with dressings intact and with short stretch washed and rolled.  New foam cut for LE's today.  Minimal drainage with noted sticking of silver hydrofiber to wound beds.  Improved granulation with removal of dressings and following debridement of slough. No odor present.  Changed dressing to xeroform to encourge more moisture in the wound bed.  Resumed short stretch and 1/2" foam today.  LE's remeasured for volume today with noted reduction bilaterally.  Had patient call and order thigh high compression stockigns from .  Sheet and measurements given to patient.     Wound Therapy - Functional Problem List  Increased pain causing pain with ambulation, difficulty donning and doffing shoes and socks due to edema.     Factors Delaying/Impairing Wound Healing  Infection - systemic/local;Multiple medical problems;Vascular compromise    Hydrotherapy Plan  Debridement;Dressing change;Patient/family education;Other (comment)    Wound Therapy - Frequency  3X / week    Wound Therapy - Current  Recommendations  PT    Wound Plan  continue appropriate dressings to promote healing environment.  compression bandaging to reduce edema.  Measure weekly for volume and wound size.     Dressing   silverhydrofiber,abd pad kerlix followed by profore                PT Short Term Goals - 08/08/18 1231      PT SHORT TERM GOAL #1   Title  B LE to have no weeping to reduce risk of infection.     Time  1    Period  Weeks    Status  On-going        PT Long Term Goals - 08/08/18 1231      PT LONG TERM GOAL #1   Title  Pain level in B LE to be no greater than a 2/10 to allow pt to walk for 30 minutes in comfort for work tasks.     Time  4    Period  Weeks    Status  On-going      PT LONG TERM GOAL #2   Title  PT to verbalize the importance of wearing compression everyday    Time  4    Period  Weeks    Status  On-going      PT LONG TERM GOAL #3   Title  PT wounds to be healed to prevent cellulitis    Time  4    Period  Weeks    Status  On-going      PT LONG TERM GOAL #4   Title  Edema to be decreased to allow pt to don shoes and socks with ease.     Time  4    Period  Weeks    Status  On-going              Patient will benefit from skilled  therapeutic intervention in order to improve the following deficits and impairments:     Visit Diagnosis: Difficulty in walking, not elsewhere classified  Pain in left leg  Pain in right leg  Localized edema  Wound of left leg, sequela  Wound of right leg, sequela     Problem List Patient Active Problem List   Diagnosis Date Noted  . CHF exacerbation ([place]) 05/29/2018  . CHF (congestive heart failure) ([place]) 05/27/2018  . Chronic respiratory failure with hypoxia (HCC)/ nocturnal hypoxemia  08/15/2017  . Symptomatic anemia 05/25/2016  . Anemia 05/25/2016  . Hypokalemia 05/25/2016  . Hyponatremia 05/25/2016  . Atrial flutter ([place]), paroxysmal   . Pericardial effusion   . Acute on chronic systolic  (congestive) heart failure ([place]) 03/29/2016  . 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 Elevated troponin 03/29/2016  . Restless leg syndrome 03/29/2016  . 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 Chest pain 03/27/2016  . COPD exacerbation ([place]) 03/27/2016  . Hypertension   . Essential hypertension   . Bradycardia 10/25/2015  . Heart block AV second degree 10/25/2015  . Accelerated hypertension 10/25/2015  . Hemochromatosis 10/25/2015  . Cigarette smoker 10/25/2015  . Alcohol dependence ([place]) 10/25/2015  . Second degree AV block 10/25/2015   Teena Irani, PTA/CLT [phone]  Teena Irani 08/14/2018, 3:59 PM  [place] [address] [place], [place], [address] Phone: [phone]   Fax:  [phone]  Name: MOUNIR SKIPPER MRN: [id number] Date of Birth: [birth year]

## 2018-08-16 ENCOUNTER — Other Ambulatory Visit: Payer: Self-pay

## 2018-08-16 ENCOUNTER — Ambulatory Visit (HOSPITAL_COMMUNITY): Payer: Medicare Other | Admitting: Physical Therapy

## 2018-08-16 DIAGNOSIS — S81802S Unspecified open wound, left lower leg, sequela: Secondary | ICD-10-CM | POA: Diagnosis not present

## 2018-08-16 DIAGNOSIS — L03119 Cellulitis of unspecified part of limb: Secondary | ICD-10-CM | POA: Diagnosis not present

## 2018-08-16 DIAGNOSIS — I5032 Chronic diastolic (congestive) heart failure: Secondary | ICD-10-CM | POA: Diagnosis not present

## 2018-08-16 DIAGNOSIS — F410 Panic disorder [episodic paroxysmal anxiety] without agoraphobia: Secondary | ICD-10-CM | POA: Diagnosis not present

## 2018-08-16 DIAGNOSIS — J441 Chronic obstructive pulmonary disease with (acute) exacerbation: Secondary | ICD-10-CM | POA: Diagnosis not present

## 2018-08-16 DIAGNOSIS — R600 Localized edema: Secondary | ICD-10-CM | POA: Diagnosis not present

## 2018-08-16 DIAGNOSIS — Z6827 Body mass index (BMI) 27.0-27.9, adult: Secondary | ICD-10-CM | POA: Diagnosis not present

## 2018-08-16 DIAGNOSIS — J060 Acute laryngopharyngitis: Secondary | ICD-10-CM | POA: Diagnosis not present

## 2018-08-16 DIAGNOSIS — G894 Chronic pain syndrome: Secondary | ICD-10-CM | POA: Diagnosis not present

## 2018-08-16 DIAGNOSIS — Z720 Tobacco use: Secondary | ICD-10-CM | POA: Diagnosis not present

## 2018-08-16 DIAGNOSIS — Z6829 Body mass index (BMI) 29.0-29.9, adult: Secondary | ICD-10-CM | POA: Diagnosis not present

## 2018-08-16 DIAGNOSIS — Z85118 Personal history of other malignant neoplasm of bronchus and lung: Secondary | ICD-10-CM | POA: Diagnosis not present

## 2018-08-16 DIAGNOSIS — Z9981 Dependence on supplemental oxygen: Secondary | ICD-10-CM | POA: Diagnosis not present

## 2018-08-16 DIAGNOSIS — S81801S Unspecified open wound, right lower leg, sequela: Secondary | ICD-10-CM

## 2018-08-16 DIAGNOSIS — I10 Essential (primary) hypertension: Secondary | ICD-10-CM | POA: Diagnosis not present

## 2018-08-16 DIAGNOSIS — R944 Abnormal results of kidney function studies: Secondary | ICD-10-CM | POA: Diagnosis not present

## 2018-08-16 DIAGNOSIS — M79604 Pain in right leg: Secondary | ICD-10-CM | POA: Diagnosis not present

## 2018-08-16 DIAGNOSIS — M79605 Pain in left leg: Secondary | ICD-10-CM | POA: Diagnosis not present

## 2018-08-16 DIAGNOSIS — C3490 Malignant neoplasm of unspecified part of unspecified bronchus or lung: Secondary | ICD-10-CM | POA: Diagnosis not present

## 2018-08-16 DIAGNOSIS — L0390 Cellulitis, unspecified: Secondary | ICD-10-CM | POA: Diagnosis not present

## 2018-08-16 DIAGNOSIS — R262 Difficulty in walking, not elsewhere classified: Secondary | ICD-10-CM | POA: Diagnosis not present

## 2018-08-16 DIAGNOSIS — F419 Anxiety disorder, unspecified: Secondary | ICD-10-CM | POA: Diagnosis not present

## 2018-08-16 DIAGNOSIS — R0602 Shortness of breath: Secondary | ICD-10-CM | POA: Diagnosis not present

## 2018-08-16 DIAGNOSIS — Z6825 Body mass index (BMI) 25.0-25.9, adult: Secondary | ICD-10-CM | POA: Diagnosis not present

## 2018-08-16 NOTE — Patient Outreach (Signed)
[place] [facility]) Care Management  08/16/2018  Shaun May 1951-02-24 [id number]    1st attempt to outreach the patient for assessment. No answer.  Six rings on the phone and no voicemail pickup. Unable to leave a message.   Plan: RN Health Coach will make another outreach attempt to the patient within thirty days.  Lazaro Arms RN, BSN, [place] Direct Dial:  [phone]  Fax: [phone]

## 2018-08-16 NOTE — Therapy (Signed)
[place], [place], [address] Phone: [phone]   Fax:  [phone]  Wound Care Therapy  Patient Details  Name: Shaun May MRN: [id number] Date of Birth: 03-17-1951 Referring Provider: Allyn Kenner   Encounter Date: 08/16/2018  PT End of Session - 08/16/18 1500    Visit Number  5    Number of Visits  12    Date for PT Re-Evaluation  09/02/18    PT Start Time  1300    PT Stop Time  1412    PT Time Calculation (min)  72 min    Activity Tolerance  Patient tolerated treatment well    Behavior During Therapy  [facility] for tasks assessed/performed       Past Medical History:  Diagnosis Date  . Alcohol use   . Anemia    when on Xarelto  . Cancer ([place])   . Chronic combined systolic and diastolic CHF (congestive heart failure) ([place])   . CKD (chronic kidney disease), stage II   . COPD (chronic obstructive pulmonary disease) (HCC)    stage 4 COPD  . Dyspnea   . Gastric ulcer   . GI bleeding   . Hematuria   . Hemochromatosis   . Hypertension   . Lung nodule   . Mobitz type 2 second degree heart block    PPM MDT 10/28/15 Dr. Lovena Le  . NICM (nonischemic cardiomyopathy) ([place])   . Paroxysmal atrial flutter ([place])   . Pericardial effusion   . Persistent atrial fibrillation ([place])   . Presence of permanent cardiac pacemaker    [employer]tr[employer]  . Tobacco abuse     Past Surgical History:  Procedure Laterality Date  . BIOPSY  10/13/2016   Procedure: BIOPSY;  Surgeon: Rogene Houston, MD;  [facility];  Service: Endoscopy;;  esophageal  . CARDIAC CATHETERIZATION N/A 03/31/2016   Procedure: Left Heart Cath and Coronary Angiography;  Surgeon: Wellington Hampshire, MD;  Location: [place] [facility];  Service: Cardiovascular;  Laterality: N/A;  . COLONOSCOPY N/A 10/13/2016   Procedure: COLONOSCOPY;  Surgeon: Rogene Houston, MD;  [facility];  Service: Endoscopy;  Laterality: N/A;  . EP IMPLANTABLE DEVICE N/A  10/28/2015   Procedure: Pacemaker Implant;  Surgeon: Evans Lance, MD;  Location: [place] CV LAB;  Service: Cardiovascular;  Laterality: N/A;  . EP IMPLANTABLE DEVICE N/A 11/07/2015   Procedure: Pocket Revision;  Surgeon: Evans Lance, MD;  Location: [place] [facility] LAB;  Service: Cardiovascular;  Laterality: N/A;  . ESOPHAGOGASTRODUODENOSCOPY N/A 05/27/2016   Procedure: ESOPHAGOGASTRODUODENOSCOPY (EGD);  Surgeon: Rogene Houston, MD;  [facility];  Service: Endoscopy;  Laterality: N/A;  . ESOPHAGOGASTRODUODENOSCOPY N/A 10/13/2016   Procedure: ESOPHAGOGASTRODUODENOSCOPY (EGD);  Surgeon: Rogene Houston, MD;  [facility];  Service: Endoscopy;  Laterality: N/A;  1:30 - moved to 11/8 @ 1:00 - Ann notified pt  . FUDUCIAL PLACEMENT Right 12/29/2017   Procedure: PLACEMENT OF FUDUCIAL;  Surgeon: [name], MD;  Location: [place];  Service: Thoracic;  Laterality: Right;  . POLYPECTOMY  10/13/2016   Procedure: POLYPECTOMY;  Surgeon: Rogene Houston, MD;  [facility];  Service: Endoscopy;;  transverse colon polypectomy  . TONSILLECTOMY    . VIDEO BRONCHOSCOPY WITH ENDOBRONCHIAL NAVIGATION Right 12/29/2017   Procedure: VIDEO BRONCHOSCOPY WITH ENDOBRONCHIAL NAVIGATION;  Surgeon: [name], MD;  Location: [place];  Service: Thoracic;  Laterality: Right;  There were no vitals filed for this visit.              Wound Therapy - 08/16/18 1453    Subjective  pt returns today stating he did not order his stockings because he didn't know what to tell them.  Returns with 4 additional rolls and bandages stilll intact.     Pain Score  0-No pain    Evaluation and Treatment Procedures Explained to Patient/Family  Yes    Wound Properties Date First Assessed: 08/03/18 Time First Assessed: 8828 Wound Type: Venous stasis ulcer;Other (Comment) Location: Leg Location Orientation: Right Wound Description (Comments): anterior aspect  Present on Admission:  Yes   Dressing Type  Compression wrap    Dressing Changed  Changed    Dressing Status  Intact;Old drainage    Dressing Change Frequency  PRN    Site / Wound Assessment  Painful;Red    % Wound base Red or Granulating  90%    % Wound base Yellow/Fibrinous Exudate  20%    Peri-wound Assessment  Edema;Erythema (blanchable);Induration    Drainage Amount  Scant    Drainage Description  Serosanguineous    Treatment  Cleansed;Debridement (Selective)    Wound Properties Date First Assessed: 08/03/18 Time First Assessed: 1455 Wound Type: Other (Comment);Venous stasis ulcer Location: Leg Location Orientation: Right;Posterior Wound Description (Comments): posterior aspect  Present on Admission: Yes   Dressing Type  Compression wrap    Dressing Changed  Changed    Dressing Status  Intact;Old drainage    Dressing Change Frequency  PRN    Site / Wound Assessment  Painful;Red    % Wound base Red or Granulating  95%    % Wound base Yellow/Fibrinous Exudate  5%    Peri-wound Assessment  Edema;Erythema (blanchable)    Drainage Amount  Scant    Drainage Description  Serosanguineous    Treatment  Cleansed;Debridement (Selective)    Wound Properties Date First Assessed: 08/03/18 Time First Assessed: 1510 Wound Type: Venous stasis ulcer;Other (Comment) Location: Leg Location Orientation: Left;Posterior Present on Admission: Yes   Dressing Type  Compression wrap;None    Dressing Changed  Changed    Dressing Status  Intact;Old drainage    Dressing Change Frequency  PRN    Site / Wound Assessment  Painful;Red    % Wound base Red or Granulating  80%    % Wound base Yellow/Fibrinous Exudate  20%    Drainage Amount  Minimal    Drainage Description  Serosanguineous    Treatment  Cleansed;Debridement (Selective)    Selective Debridement - Location  bilateral LE's    Selective Debridement - Tools Used  Forceps;Scalpel    Selective Debridement - Tissue Removed  slough and devitalized tissue    Wound Therapy -  Clinical Statement  dressings intact.  Wounds with continued improvement, especially rt LE.  Lt posterior wounds with most slough as compared to rest of areas.  No redness and minimal dryness in LE's.  Cleansed and moisturized well before reapplying compression bandaging.  Discussed with spouse and patient detailed instructions for calling [employer] and written out instructions on company flier.  Requested they order asap as wounds are healing quickly.      Wound Therapy - Functional Problem List  Increased pain causing pain with ambulation, difficulty donning and doffing shoes and socks due to edema.     Factors Delaying/Impairing Wound Healing  Infection - systemic/local;Multiple medical problems;Vascular compromise    Hydrotherapy Plan  Debridement;Dressing change;Patient/family education;Other (  comment)    Wound Therapy - Frequency  3X / week    Wound Therapy - Current Recommendations  PT    Wound Plan  continue appropriate dressings to promote healing environment.  compression bandaging to reduce edema.  Measure weekly for volume and wound size.     Dressing   xeroform, kerlix followed by profore              PT Education - 08/16/18 1459    Education Details  order stockings today from [facility].  given written instructions.  Instructed to wash bandages, hang dry and return rolled up    Person(s) Educated  Patient;Spouse    Methods  Explanation    Comprehension  Verbalized understanding       PT Short Term Goals - 08/08/18 1231      PT SHORT TERM GOAL #1   Title  B LE to have no weeping to reduce risk of infection.     Time  1    Period  Weeks    Status  On-going        PT Long Term Goals - 08/08/18 1231      PT LONG TERM GOAL #1   Title  Pain level in B LE to be no greater than a 2/10 to allow pt to walk for 30 minutes in comfort for work tasks.     Time  4    Period  Weeks    Status  On-going      PT LONG TERM GOAL #2   Title  PT to verbalize the  importance of wearing compression everyday    Time  4    Period  Weeks    Status  On-going      PT LONG TERM GOAL #3   Title  PT wounds to be healed to prevent cellulitis    Time  4    Period  Weeks    Status  On-going      PT LONG TERM GOAL #4   Title  Edema to be decreased to allow pt to don shoes and socks with ease.     Time  4    Period  Weeks    Status  On-going              Patient will benefit from skilled therapeutic intervention in order to improve the following deficits and impairments:     Visit Diagnosis: Difficulty in walking, not elsewhere classified  Pain in left leg  Localized edema  Pain in right leg  Wound of right leg, sequela  Wound of left leg, sequela     Problem List Patient Active Problem List   Diagnosis Date Noted  . CHF exacerbation ([place]) 05/29/2018  . CHF (congestive heart failure) ([place]) 05/27/2018  . Chronic respiratory failure with hypoxia (HCC)/ nocturnal hypoxemia  08/15/2017  . Symptomatic anemia 05/25/2016  . Anemia 05/25/2016  . Hypokalemia 05/25/2016  . Hyponatremia 05/25/2016  . Atrial flutter ([place]), paroxysmal   . Pericardial effusion   . Acute on chronic systolic (congestive) heart failure ([place]) 03/29/2016  . 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 Elevated troponin 03/29/2016  . Restless leg syndrome 03/29/2016  . 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 Chest pain 03/27/2016  . COPD exacerbation ([place]) 03/27/2016  . Hypertension   . Essential hypertension   . Bradycardia 10/25/2015  . Heart block AV second degree 10/25/2015  .  Accelerated hypertension 10/25/2015  . Hemochromatosis 10/25/2015  . Cigarette smoker 10/25/2015  . Alcohol dependence ([place]) 10/25/2015  . Second degree AV block 10/25/2015   Teena Irani, PTA/CLT [phone]  Teena Irani 08/16/2018, 3:01 PM  [place] [address] [place], [place],  [address] Phone: [phone]   Fax:  [phone]  Name: Shaun May MRN: [id number] Date of Birth: [birth year]

## 2018-08-16 NOTE — Patient Outreach (Signed)
[facility]) Care Management  08/16/2018   KRIS NO 10/16/1951 [id number]  Subjective: Received call from the patient for assessment. HIPAA verified. The patient states that he is doing better.  He has been set up with the wound clinic to wrap his legs.  The swelling is going down.  He is also being fitted for compression stockings. He denies chest pain and falls.  He still has some shortness of breath with exertion but has a new mobile oxygen unit that is on 2 litters.  He states that it helps him throughout the day when out and his concentrator he uses at home.   His weight today was 201.  He is monitoring the salt in his diet.  He has two appointments today one to have his legs wrapped and to see Dr Nevada Crane his PCP.   Current Medications:  Current Outpatient Medications  Medication Sig Dispense Refill  . albuterol (PROVENTIL HFA;VENTOLIN HFA) 108 (90 Base) MCG/ACT inhaler Inhale 1-2 puffs into the lungs every 6 (six) hours as needed for wheezing or shortness of breath. 1 Inhaler 0  . aspirin EC 81 MG tablet Take 81 mg by mouth daily.    Marland Kitchen atorvastatin (LIPITOR) 40 MG tablet TAKE ONE TABLET BY MOUTH ONCE DAILY AT 6PM. 30 tablet 6  . carvedilol (COREG) 25 MG tablet Take 25 mg by mouth 2 (two) times daily with a meal.   11  . Dextromethorphan-Guaifenesin (MUCINEX DM MAXIMUM STRENGTH) 60-1200 MG TB12 Take 1 tablet by mouth daily.    . Fluticasone-Umeclidin-Vilant (TRELEGY ELLIPTA) 100-62.5-25 MCG/INH AEPB Inhale 1 puff into the lungs daily. 60 each 11  . LORazepam (ATIVAN) 2 MG tablet Take 2 mg by mouth 2 (two) times daily.     . Magnesium 250 MG TABS Take 500 mg by mouth daily.    Marland Kitchen oxyCODONE-acetaminophen (PERCOCET/ROXICET) 5-325 MG tablet Take 1 tablet by mouth 2 (two) times daily. *May take one tablet three times daily as needed for pain    . OXYGEN Inhale 2 L into the lungs at bedtime. **2L with sleep and exertion if needed    . pantoprazole (PROTONIX) 40 MG tablet TAKE  1 TABLET BY MOUTH DAILY BEFORE BREAKFAST. 90 tablet 3  . potassium chloride SA (K-DUR,KLOR-CON) 20 MEQ tablet Take 1 tablet (20 mEq total) by mouth daily. 90 tablet 3  . rOPINIRole (REQUIP) 0.25 MG tablet Take 0.25-0.75 mg by mouth at bedtime. Pt. Says he takes 3 tablets at bedtime.  1  . sodium chloride (OCEAN) 0.65 % SOLN nasal spray Place 1 spray into both nostrils as needed for congestion.    . torsemide (DEMADEX) 20 MG tablet Take 1 tablet (20 mg total) by mouth daily. May taken additional 20 mg for swelling 90 tablet 3   No current facility-administered medications for this visit.     Functional Status:  In your present state of health, do you have any difficulty performing the following activities: 05/27/2018 05/27/2018  Hearing? N N  Vision? N N  Difficulty concentrating or making decisions? N N  Walking or climbing stairs? Y N  Dressing or bathing? N N  Doing errands, shopping? N -  Some recent data might be hidden    Fall/Depression Screening: Fall Risk  08/16/2018 06/06/2018 05/23/2018  Falls in the past year? No No No   PHQ 2/9 Scores 06/06/2018 06/02/2018 05/23/2018 03/07/2018 01/06/2018  PHQ - 2 Score 2 0 0 0 0  PHQ- 9 Score 7 - - - -  Assessment: Patient will continue to benefit from health coach outreach for disease management and support.  THN CM Care Plan Problem One     Most Recent Value  Care Plan Problem One  Knowledge deficit related to diease manegment  Role Documenting the Problem One  [place] for Problem One  Active  THN Long Term Goal   IN 90 days the patient will verbalize that he has remained hospital free  [place] Term Goal Start Date  08/16/18  Interventions for Problem One Long Term Goal  Discussed the patients diet, his medications, signs and symptoms of CHF, talked with the patient about red zone chf action plan      Plan: RN Health Coach will contact patient in the month of December and patient agrees to next outreach.  Lazaro Arms  RN, BSN, [place] Direct Dial:  [phone]  Fax: [phone]

## 2018-08-17 ENCOUNTER — Telehealth (HOSPITAL_COMMUNITY): Payer: Self-pay | Admitting: Internal Medicine

## 2018-08-17 NOTE — Telephone Encounter (Signed)
08/17/18  Pt called to check on his appointments.  He said that more were made but they were all in October and he wanted to make sure he had some in September.  I went over the days on the schedule to make sure that he had what we had on the schedule and he did.  He said he didn't want the appointments in October but I told him those were made because he is a 90 minute patient and it is easier to cancel them than try to add them in and he said ok just leave them.

## 2018-08-18 ENCOUNTER — Ambulatory Visit (HOSPITAL_COMMUNITY): Admission: RE | Admit: 2018-08-18 | Payer: Medicare Other | Source: Ambulatory Visit

## 2018-08-21 ENCOUNTER — Encounter

## 2018-08-21 ENCOUNTER — Ambulatory Visit (HOSPITAL_COMMUNITY): Payer: Medicare Other | Admitting: Physical Therapy

## 2018-08-21 DIAGNOSIS — S81802S Unspecified open wound, left lower leg, sequela: Secondary | ICD-10-CM

## 2018-08-21 DIAGNOSIS — M79605 Pain in left leg: Secondary | ICD-10-CM

## 2018-08-21 DIAGNOSIS — R600 Localized edema: Secondary | ICD-10-CM

## 2018-08-21 DIAGNOSIS — M79604 Pain in right leg: Secondary | ICD-10-CM | POA: Diagnosis not present

## 2018-08-21 DIAGNOSIS — S81801S Unspecified open wound, right lower leg, sequela: Secondary | ICD-10-CM | POA: Diagnosis not present

## 2018-08-21 DIAGNOSIS — R262 Difficulty in walking, not elsewhere classified: Secondary | ICD-10-CM

## 2018-08-21 NOTE — Therapy (Signed)
[place], [place], [address] Phone: [phone]   Fax:  [phone]  Wound Care Therapy  Patient Details  Name: Shaun May MRN: [id number] Date of Birth: 1951-05-05 Referring Provider: Allyn Kenner   Encounter Date: 08/21/2018  PT End of Session - 08/21/18 1659    Visit Number  6    Number of Visits  12    Date for PT Re-Evaluation  09/02/18    PT Start Time  7517    PT Stop Time  1510    PT Time Calculation (min)  75 min    Activity Tolerance  Patient tolerated treatment well    Behavior During Therapy  [facility] for tasks assessed/performed       Past Medical History:  Diagnosis Date  . Alcohol use   . Anemia    when on Xarelto  . Cancer ([place])   . Chronic combined systolic and diastolic CHF (congestive heart failure) ([place])   . CKD (chronic kidney disease), stage II   . COPD (chronic obstructive pulmonary disease) (HCC)    stage 4 COPD  . Dyspnea   . Gastric ulcer   . GI bleeding   . Hematuria   . Hemochromatosis   . Hypertension   . Lung nodule   . Mobitz type 2 second degree heart block    PPM MDT 10/28/15 Dr. Lovena Le  . NICM (nonischemic cardiomyopathy) ([place])   . Paroxysmal atrial flutter ([place])   . Pericardial effusion   . Persistent atrial fibrillation ([place])   . Presence of permanent cardiac pacemaker    Medtronic  . Tobacco abuse     Past Surgical History:  Procedure Laterality Date  . BIOPSY  10/13/2016   Procedure: BIOPSY;  Surgeon: Rogene Houston, MD;  [facility];  Service: Endoscopy;;  esophageal  . CARDIAC CATHETERIZATION N/A 03/31/2016   Procedure: Left Heart Cath and Coronary Angiography;  Surgeon: Wellington Hampshire, MD;  Location: [place] CV LAB;  Service: Cardiovascular;  Laterality: N/A;  . COLONOSCOPY N/A 10/13/2016   Procedure: COLONOSCOPY;  Surgeon: Rogene Houston, MD;  [facility];  Service: Endoscopy;  Laterality: N/A;  . EP IMPLANTABLE DEVICE N/A  10/28/2015   Procedure: Pacemaker Implant;  Surgeon: Evans Lance, MD;  Location: [place] CV LAB;  Service: Cardiovascular;  Laterality: N/A;  . EP IMPLANTABLE DEVICE N/A 11/07/2015   Procedure: Pocket Revision;  Surgeon: Evans Lance, MD;  Location: [place] CV LAB;  Service: Cardiovascular;  Laterality: N/A;  . ESOPHAGOGASTRODUODENOSCOPY N/A 05/27/2016   Procedure: ESOPHAGOGASTRODUODENOSCOPY (EGD);  Surgeon: Rogene Houston, MD;  [facility];  Service: Endoscopy;  Laterality: N/A;  . ESOPHAGOGASTRODUODENOSCOPY N/A 10/13/2016   Procedure: ESOPHAGOGASTRODUODENOSCOPY (EGD);  Surgeon: Rogene Houston, MD;  [facility];  Service: Endoscopy;  Laterality: N/A;  1:30 - moved to 11/8 @ 1:00 - Ann notified pt  . FUDUCIAL PLACEMENT Right 12/29/2017   Procedure: PLACEMENT OF FUDUCIAL;  Surgeon: [name], MD;  Location: [place];  Service: Thoracic;  Laterality: Right;  . POLYPECTOMY  10/13/2016   Procedure: POLYPECTOMY;  Surgeon: Rogene Houston, MD;  [facility];  Service: Endoscopy;;  transverse colon polypectomy  . TONSILLECTOMY    . VIDEO BRONCHOSCOPY WITH ENDOBRONCHIAL NAVIGATION Right 12/29/2017   Procedure: VIDEO BRONCHOSCOPY WITH ENDOBRONCHIAL NAVIGATION;  Surgeon: [name], MD;  Location: [place];  Service: Thoracic;  Laterality: Right;  There were no vitals filed for this visit.              Wound Therapy - 08/21/18 1652    Subjective  Pt returns today with thigh high stockings ordered from [facility] (3 pairs).    Pain Score  0-No pain    Evaluation and Treatment Procedures Explained to Patient/Family  Yes    Wound Properties Date First Assessed: 08/03/18 Time First Assessed: 4742 Wound Type: Venous stasis ulcer;Other (Comment) Location: Leg Location Orientation: Right Wound Description (Comments): anterior aspect  Present on Admission: Yes   Dressing Type  Compression wrap    Dressing Changed  Changed     Dressing Status  Intact;Old drainage    Dressing Change Frequency  PRN    Site / Wound Assessment  Pink;Clean;Dry    % Wound base Red or Granulating  100%    % Wound base Yellow/Fibrinous Exudate  0%    Peri-wound Assessment  Intact    Drainage Amount  Scant    Drainage Description  Serosanguineous    Treatment  Cleansed;Debridement (Selective)    Wound Properties Date First Assessed: 08/03/18 Time First Assessed: 1455 Wound Type: Other (Comment);Venous stasis ulcer Location: Leg Location Orientation: Right;Posterior Wound Description (Comments): posterior aspect  Present on Admission: Yes   Dressing Type  Compression wrap    Dressing Changed  Changed    Dressing Status  Intact;Old drainage    Dressing Change Frequency  PRN    Site / Wound Assessment  Clean;Dry    % Wound base Red or Granulating  100%    % Wound base Yellow/Fibrinous Exudate  0%    Peri-wound Assessment  Intact    Drainage Amount  None    Drainage Description  --    Treatment  Cleansed;Debridement (Selective)    Wound Properties Date First Assessed: 08/03/18 Time First Assessed: 1510 Wound Type: Venous stasis ulcer;Other (Comment) Location: Leg Location Orientation: Left;Posterior Present on Admission: Yes   Dressing Type  Compression wrap;None    Dressing Changed  Changed    Dressing Status  Intact;Old drainage    Dressing Change Frequency  PRN    Site / Wound Assessment  Pink    % Wound base Red or Granulating  80%    % Wound base Yellow/Fibrinous Exudate  20%    Drainage Amount  Scant    Drainage Description  Serosanguineous    Treatment  Cleansed;Debridement (Selective)    Selective Debridement - Location  bilateral LE's    Selective Debridement - Tools Used  Forceps;Scalpel    Selective Debridement - Tissue Removed  slough and devitalized tissue    Wound Therapy - Clinical Statement  Removed dressings with noted improvement.  Removed all dry skin and remaining devitalized tissue to reveal only small area  approx 0.2X0.2cm on anterior Rt LE remains.  Lt LE still with 3 areas that continues to have slough and scant drainage.  Also noted on Lt LE, foam is causing skin to pucker lateral and medial with beginnings of a blister.  Continued with short stretch on Rt LE, however changed to profore on Lt to prevent the puckering.  LE's cleansed and moisturized well prior to rebandaging.  Anticpate changing to garment on Rt LE next session, possible Lt if healed posterioly.    Wound Therapy - Functional Problem List  Increased pain causing pain with ambulation, difficulty donning and doffing shoes and socks due to edema.     Factors Delaying/Impairing Wound Healing  Infection -  systemic/local;Multiple medical problems;Vascular compromise    Hydrotherapy Plan  Debridement;Dressing change;Patient/family education;Other (comment)    Wound Therapy - Frequency  3X / week    Wound Therapy - Current Recommendations  PT    Wound Plan  continue appropriate dressings to promote healing environment.  compression bandaging to reduce edema.  Measure weekly for volume and wound size.     Dressing   Lt: xeroform, kerlix followed by profore    Dressing  Rt: xeroform, kerlix f/b 1/2' foam, cotton and short stretch bandaging                PT Short Term Goals - 08/08/18 1231      PT SHORT TERM GOAL #1   Title  B LE to have no weeping to reduce risk of infection.     Time  1    Period  Weeks    Status  On-going        PT Long Term Goals - 08/08/18 1231      PT LONG TERM GOAL #1   Title  Pain level in B LE to be no greater than a 2/10 to allow pt to walk for 30 minutes in comfort for work tasks.     Time  4    Period  Weeks    Status  On-going      PT LONG TERM GOAL #2   Title  PT to verbalize the importance of wearing compression everyday    Time  4    Period  Weeks    Status  On-going      PT LONG TERM GOAL #3   Title  PT wounds to be healed to prevent cellulitis    Time  4    Period  Weeks     Status  On-going      PT LONG TERM GOAL #4   Title  Edema to be decreased to allow pt to don shoes and socks with ease.     Time  4    Period  Weeks    Status  On-going              Patient will benefit from skilled therapeutic intervention in order to improve the following deficits and impairments:     Visit Diagnosis: Difficulty in walking, not elsewhere classified  Pain in left leg  Localized edema  Pain in right leg  Wound of right leg, sequela  Wound of left leg, sequela     Problem List Patient Active Problem List   Diagnosis Date Noted  . CHF exacerbation ([place]) 05/29/2018  . CHF (congestive heart failure) ([place]) 05/27/2018  . Chronic respiratory failure with hypoxia (HCC)/ nocturnal hypoxemia  08/15/2017  . Symptomatic anemia 05/25/2016  . Anemia 05/25/2016  . Hypokalemia 05/25/2016  . Hyponatremia 05/25/2016  . Atrial flutter ([place]), paroxysmal   . Pericardial effusion   . Acute on chronic systolic (congestive) heart failure ([place]) 03/29/2016  . 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 Elevated troponin 03/29/2016  . Restless leg syndrome 03/29/2016  . Presumed cancer of right upper lobe of lung ([place]) 03/29/2016  . Chest pain 03/27/2016  . COPD exacerbation ([place]) 03/27/2016  . Hypertension   . Essential hypertension   . Bradycardia 10/25/2015  . Heart block AV second degree 10/25/2015  . Accelerated hypertension 10/25/2015  . Hemochromatosis 10/25/2015  . Cigarette smoker 10/25/2015  . Alcohol dependence ([place]) 10/25/2015  . Second degree AV block 10/25/2015  Teena Irani, PTA/CLT [phone]  Teena Irani 08/21/2018, 5:00 PM  [facility] [address] [place], [place], [address] Phone: [phone]   Fax:  [phone]  Name: Shaun May MRN: [id number] Date of Birth: [birth year]

## 2018-08-23 ENCOUNTER — Ambulatory Visit (HOSPITAL_COMMUNITY): Payer: Medicare Other | Admitting: Physical Therapy

## 2018-08-23 DIAGNOSIS — R262 Difficulty in walking, not elsewhere classified: Secondary | ICD-10-CM | POA: Diagnosis not present

## 2018-08-23 DIAGNOSIS — S81802S Unspecified open wound, left lower leg, sequela: Secondary | ICD-10-CM

## 2018-08-23 DIAGNOSIS — M79605 Pain in left leg: Secondary | ICD-10-CM

## 2018-08-23 DIAGNOSIS — M79604 Pain in right leg: Secondary | ICD-10-CM | POA: Diagnosis not present

## 2018-08-23 DIAGNOSIS — R600 Localized edema: Secondary | ICD-10-CM | POA: Diagnosis not present

## 2018-08-23 DIAGNOSIS — S81801S Unspecified open wound, right lower leg, sequela: Secondary | ICD-10-CM

## 2018-08-23 NOTE — Therapy (Signed)
[place], [place], [address] Phone: [phone]   Fax:  [phone]  Wound Care Therapy  Patient Details  Name: Shaun May MRN: [id number] Date of Birth: 1951/03/18 Referring Provider: Allyn Kenner   Encounter Date: 08/23/2018  PT End of Session - 08/23/18 1432    Visit Number  7    Number of Visits  12    Date for PT Re-Evaluation  09/02/18    PT Start Time  1300    PT Stop Time  1345    PT Time Calculation (min)  45 min    Activity Tolerance  Patient tolerated treatment well    Behavior During Therapy  [facility] for tasks assessed/performed       Past Medical History:  Diagnosis Date  . Alcohol use   . Anemia    when on Xarelto  . Cancer ([place])   . Chronic combined systolic and diastolic CHF (congestive heart failure) ([place])   . CKD (chronic kidney disease), stage II   . COPD (chronic obstructive pulmonary disease) (HCC)    stage 4 COPD  . Dyspnea   . Gastric ulcer   . GI bleeding   . Hematuria   . Hemochromatosis   . Hypertension   . Lung nodule   . Mobitz type 2 second degree heart block    PPM MDT 10/28/15 Dr. Lovena Le  . NICM (nonischemic cardiomyopathy) ([place])   . Paroxysmal atrial flutter ([place])   . Pericardial effusion   . Persistent atrial fibrillation ([place])   . Presence of permanent cardiac pacemaker    Medtronic  . Tobacco abuse     Past Surgical History:  Procedure Laterality Date  . BIOPSY  10/13/2016   Procedure: BIOPSY;  Surgeon: Rogene Houston, MD;  [facility];  Service: Endoscopy;;  esophageal  . CARDIAC CATHETERIZATION N/A 03/31/2016   Procedure: Left Heart Cath and Coronary Angiography;  Surgeon: Wellington Hampshire, MD;  Location: [place] CV LAB;  Service: Cardiovascular;  Laterality: N/A;  . COLONOSCOPY N/A 10/13/2016   Procedure: COLONOSCOPY;  Surgeon: Rogene Houston, MD;  [facility];  Service: Endoscopy;  Laterality: N/A;  . EP IMPLANTABLE DEVICE N/A  10/28/2015   Procedure: Pacemaker Implant;  Surgeon: Evans Lance, MD;  Location: [place] CV LAB;  Service: Cardiovascular;  Laterality: N/A;  . EP IMPLANTABLE DEVICE N/A 11/07/2015   Procedure: Pocket Revision;  Surgeon: Evans Lance, MD;  Location: [place] CV LAB;  Service: Cardiovascular;  Laterality: N/A;  . ESOPHAGOGASTRODUODENOSCOPY N/A 05/27/2016   Procedure: ESOPHAGOGASTRODUODENOSCOPY (EGD);  Surgeon: Rogene Houston, MD;  [facility];  Service: Endoscopy;  Laterality: N/A;  . ESOPHAGOGASTRODUODENOSCOPY N/A 10/13/2016   Procedure: ESOPHAGOGASTRODUODENOSCOPY (EGD);  Surgeon: Rogene Houston, MD;  [facility];  Service: Endoscopy;  Laterality: N/A;  1:30 - moved to 11/8 @ 1:00 - Ann notified pt  . FUDUCIAL PLACEMENT Right 12/29/2017   Procedure: PLACEMENT OF FUDUCIAL;  Surgeon: [name], MD;  Location: [place];  Service: Thoracic;  Laterality: Right;  . POLYPECTOMY  10/13/2016   Procedure: POLYPECTOMY;  Surgeon: Rogene Houston, MD;  [facility];  Service: Endoscopy;;  transverse colon polypectomy  . TONSILLECTOMY    . VIDEO BRONCHOSCOPY WITH ENDOBRONCHIAL NAVIGATION Right 12/29/2017   Procedure: VIDEO BRONCHOSCOPY WITH ENDOBRONCHIAL NAVIGATION;  Surgeon: [name], MD;  Location: [place];  Service: Thoracic;  Laterality: Right;  There were no vitals filed for this visit.              Wound Therapy - 08/23/18 1423    Subjective  pt with hopes today will be his last visit.     Pain Score  0-No pain    Wound Properties Date First Assessed: 08/03/18 Time First Assessed: 1441 Wound Type: Venous stasis ulcer;Other (Comment) Location: Leg Location Orientation: Right Wound Description (Comments): anterior aspect  Present on Admission: Yes   Dressing Changed  Changed    Dressing Status  None    % Wound base Red or Granulating  100%    Drainage Amount  None    Treatment  Cleansed    Wound Properties Date First  Assessed: 08/03/18 Time First Assessed: 1510 Wound Type: Venous stasis ulcer;Other (Comment) Location: Leg Location Orientation: Left;Posterior Present on Admission: Yes   Dressing Type  Compression wrap;None    Dressing Changed  Changed    Dressing Status  Intact;Old drainage    % Wound base Red or Granulating  95%   after debridement   % Wound base Yellow/Fibrinous Exudate  5%    Wound Length (cm)  4 cm   was 7cm   Wound Width (cm)  1 cm   was 5 cm   Wound Surface Area (cm^2)  4 cm^2    Drainage Amount  Scant    Drainage Description  Serous    Treatment  Cleansed;Debridement (Selective)    Selective Debridement - Location  posterior LT LE    Selective Debridement - Tools Used  Forceps    Selective Debridement - Tissue Removed  slough and devitalized tissue    Wound Therapy - Clinical Statement  Rt short stretch removed revealing complete healing of wounds.  Lt LE still with small area 4X1 cm but with increased granulation following debridment.  Cleaansed well and applied small dressing with use of stockings today.  Worked with patient on donning garments and pt with interest using sock butler.  Instructed to go next door [facility]) and obtain one.  Pt verbalized understanding.  Instructed to don each morning and remove each evening and apply lotion.  PT verbalized understanding.  Pt to return on Friday with hopes of complete healing of LT posterior wound in which case he can be discharged.     Wound Plan  follow up with garment comfort/fit and ease of getting on/off.  Remeasure volume and if wound on posterior Lt LE healed, may discharge.                 PT Short Term Goals - 08/08/18 1231      PT SHORT TERM GOAL #1   Title  B LE to have no weeping to reduce risk of infection.     Time  1    Period  Weeks    Status  On-going        PT Long Term Goals - 08/08/18 1231      PT LONG TERM GOAL #1   Title  Pain level in B LE to be no greater than a 2/10 to allow  pt to walk for 30 minutes in comfort for work tasks.     Time  4    Period  Weeks    Status  On-going      PT LONG TERM GOAL #2   Title  PT to verbalize the importance of wearing compression everyday    Time  4  Period  Weeks    Status  On-going      PT LONG TERM GOAL #3   Title  PT wounds to be healed to prevent cellulitis    Time  4    Period  Weeks    Status  On-going      PT LONG TERM GOAL #4   Title  Edema to be decreased to allow pt to don shoes and socks with ease.     Time  4    Period  Weeks    Status  On-going              Patient will benefit from skilled therapeutic intervention in order to improve the following deficits and impairments:     Visit Diagnosis: Difficulty in walking, not elsewhere classified  Pain in left leg  Localized edema  Pain in right leg  Wound of right leg, sequela  Wound of left leg, sequela     Problem List Patient Active Problem List   Diagnosis Date Noted  . CHF exacerbation ([place]) 05/29/2018  . CHF (congestive heart failure) ([place]) 05/27/2018  . Chronic respiratory failure with hypoxia (HCC)/ nocturnal hypoxemia  08/15/2017  . Symptomatic anemia 05/25/2016  . Anemia 05/25/2016  . Hypokalemia 05/25/2016  . Hyponatremia 05/25/2016  . Atrial flutter ([place]), paroxysmal   . Pericardial effusion   . Acute on chronic systolic (congestive) heart failure ([place]) 03/29/2016  . 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 Elevated troponin 03/29/2016  . Restless leg syndrome 03/29/2016  . 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 Chest pain 03/27/2016  . COPD exacerbation ([place]) 03/27/2016  . Hypertension   . Essential hypertension   . Bradycardia 10/25/2015  . Heart block AV second degree 10/25/2015  . Accelerated hypertension 10/25/2015  . Hemochromatosis 10/25/2015  . Cigarette smoker 10/25/2015  . Alcohol dependence ([place]) 10/25/2015  . Second degree AV block 10/25/2015   Teena Irani, PTA/CLT [phone]  Teena Irani 08/23/2018, 2:33 PM  [facility] [address] [place], [place], [address] Phone: [phone]   Fax:  [phone]  Name: ALEXIOS KEOWN MRN: [id number] Date of Birth: 1951-08-14

## 2018-08-24 LAB — CUP PACEART REMOTE DEVICE CHECK
Battery Remaining Longevity: 121 mo
Battery Voltage: 2.79 V
Brady Statistic AP VP Percent: 39 %
Brady Statistic AP VS Percent: 0 %
Brady Statistic AS VP Percent: 61 %
Date Time Interrogation Session: 20190829152244
Implantable Lead Implant Date: 20161122
Implantable Lead Location: 753860
Implantable Lead Model: 5076
Implantable Pulse Generator Implant Date: 20161122
Lead Channel Impedance Value: 510 Ohm
Lead Channel Pacing Threshold Amplitude: 0.5 V
Lead Channel Pacing Threshold Amplitude: 0.5 V
Lead Channel Pacing Threshold Pulse Width: 0.4 ms
Lead Channel Setting Pacing Amplitude: 2 V
MDC IDC LEAD IMPLANT DT: 20161122
MDC IDC LEAD LOCATION: 753859
MDC IDC MSMT BATTERY IMPEDANCE: 163 Ohm
MDC IDC MSMT LEADCHNL RA PACING THRESHOLD PULSEWIDTH: 0.4 ms
MDC IDC MSMT LEADCHNL RV IMPEDANCE VALUE: 565 Ohm
MDC IDC SET LEADCHNL RV PACING AMPLITUDE: 2.5 V
MDC IDC SET LEADCHNL RV PACING PULSEWIDTH: 0.4 ms
MDC IDC SET LEADCHNL RV SENSING SENSITIVITY: 4 mV
MDC IDC STAT BRADY AS VS PERCENT: 0 %

## 2018-08-25 ENCOUNTER — Encounter (HOSPITAL_COMMUNITY): Payer: Self-pay

## 2018-08-25 ENCOUNTER — Ambulatory Visit (HOSPITAL_COMMUNITY): Payer: Medicare Other

## 2018-08-25 ENCOUNTER — Ambulatory Visit (HOSPITAL_COMMUNITY)
Admission: RE | Admit: 2018-08-25 | Discharge: 2018-08-25 | Disposition: A | Discharge status: Home / Self Care | Payer: Medicare Other | Source: Ambulatory Visit | Attending: Internal Medicine | Admitting: Internal Medicine

## 2018-08-25 DIAGNOSIS — R262 Difficulty in walking, not elsewhere classified: Secondary | ICD-10-CM | POA: Diagnosis not present

## 2018-08-25 DIAGNOSIS — M79604 Pain in right leg: Secondary | ICD-10-CM | POA: Diagnosis not present

## 2018-08-25 DIAGNOSIS — M79605 Pain in left leg: Secondary | ICD-10-CM

## 2018-08-25 DIAGNOSIS — R600 Localized edema: Secondary | ICD-10-CM

## 2018-08-25 DIAGNOSIS — S81802S Unspecified open wound, left lower leg, sequela: Secondary | ICD-10-CM | POA: Diagnosis not present

## 2018-08-25 DIAGNOSIS — S81801S Unspecified open wound, right lower leg, sequela: Secondary | ICD-10-CM | POA: Diagnosis not present

## 2018-08-25 DIAGNOSIS — I739 Peripheral vascular disease, unspecified: Secondary | ICD-10-CM | POA: Insufficient documentation

## 2018-08-25 NOTE — Therapy (Signed)
[place], [place], [address] Phone: [phone]   Fax:  [phone]  Wound Care Therapy  Patient Details  Name: Shaun May MRN: [id number] Date of Birth: January 24, 1951 Referring Provider: Allyn Kenner   Encounter Date: 08/25/2018  PT End of Session - 08/25/18 1845    Visit Number  8    Number of Visits  12    Date for PT Re-Evaluation  09/02/18    Authorization Type  Medicare A&B    Authorization Time Period  Cert: 6/96--> 7/89/38    Authorization - Visit Number  8    Authorization - Number of Visits  10    PT Start Time  1017    PT Stop Time  1520    PT Time Calculation (min)  55 min    Activity Tolerance  Patient tolerated treatment well    Behavior During Therapy  [facility] for tasks assessed/performed       Past Medical History:  Diagnosis Date  . Alcohol use   . Anemia    when on Xarelto  . Cancer ([place])   . Chronic combined systolic and diastolic CHF (congestive heart failure) ([place])   . CKD (chronic kidney disease), stage II   . COPD (chronic obstructive pulmonary disease) (HCC)    stage 4 COPD  . Dyspnea   . Gastric ulcer   . GI bleeding   . Hematuria   . Hemochromatosis   . Hypertension   . Lung nodule   . Mobitz type 2 second degree heart block    PPM MDT 10/28/15 Dr. Lovena Le  . NICM (nonischemic cardiomyopathy) ([place])   . Paroxysmal atrial flutter ([place])   . Pericardial effusion   . Persistent atrial fibrillation ([place])   . Presence of permanent cardiac pacemaker    Medtronic  . Tobacco abuse     Past Surgical History:  Procedure Laterality Date  . BIOPSY  10/13/2016   Procedure: BIOPSY;  Surgeon: Rogene Houston, MD;  [facility];  Service: Endoscopy;;  esophageal  . CARDIAC CATHETERIZATION N/A 03/31/2016   Procedure: Left Heart Cath and Coronary Angiography;  Surgeon: Wellington Hampshire, MD;  Location: [place] CV [facility];  Service: Cardiovascular;  Laterality: N/A;  . COLONOSCOPY  N/A 10/13/2016   Procedure: COLONOSCOPY;  Surgeon: Rogene Houston, MD;  [facility];  Service: Endoscopy;  Laterality: N/A;  . EP IMPLANTABLE DEVICE N/A 10/28/2015   Procedure: Pacemaker Implant;  Surgeon: Evans Lance, MD;  Location: [place] CV LAB;  Service: Cardiovascular;  Laterality: N/A;  . EP IMPLANTABLE DEVICE N/A 11/07/2015   Procedure: Pocket Revision;  Surgeon: Evans Lance, MD;  Location: [place] CV LAB;  Service: Cardiovascular;  Laterality: N/A;  . ESOPHAGOGASTRODUODENOSCOPY N/A 05/27/2016   Procedure: ESOPHAGOGASTRODUODENOSCOPY (EGD);  Surgeon: Rogene Houston, MD;  [facility];  Service: Endoscopy;  Laterality: N/A;  . ESOPHAGOGASTRODUODENOSCOPY N/A 10/13/2016   Procedure: ESOPHAGOGASTRODUODENOSCOPY (EGD);  Surgeon: Rogene Houston, MD;  [facility];  Service: Endoscopy;  Laterality: N/A;  1:30 - moved to 11/8 @ 1:00 - Ann notified pt  . FUDUCIAL PLACEMENT Right 12/29/2017   Procedure: PLACEMENT OF FUDUCIAL;  Surgeon: [name], MD;  Location: [place];  Service: Thoracic;  Laterality: Right;  . POLYPECTOMY  10/13/2016   Procedure: POLYPECTOMY;  Surgeon: Rogene Houston, MD;  [facility];  Service: Endoscopy;;  transverse colon polypectomy  . TONSILLECTOMY    .  VIDEO BRONCHOSCOPY WITH ENDOBRONCHIAL NAVIGATION Right 12/29/2017   Procedure: VIDEO BRONCHOSCOPY WITH ENDOBRONCHIAL NAVIGATION;  Surgeon: [name], MD;  Location: [place];  Service: Thoracic;  Laterality: Right;    There were no vitals filed for this visit.   Subjective Assessment - 08/25/18 1832    Subjective  Pt arrived with dressings in bag, stated he has testing earlier today to address circulation.  Pt hoping he is discharged this session.      Patient Stated Goals  less pain     Currently in Pain?  No/denies                Wound Therapy - 08/25/18 1833    Subjective  Pt arrived with dressings in bag, stated he has testing  earlier today to address circulation.  Pt hoping he is discharged this session.      Patient and Family Stated Goals  wounds to heal     Date of Onset  05/25/18    Prior Treatments  self care     Pain Score  0-No pain    Evaluation and Treatment Procedures Explained to Patient/Family  Yes    Evaluation and Treatment Procedures  agreed to    Wound Properties Date First Assessed: 08/03/18 Time First Assessed: 1455 Wound Type: Other (Comment);Venous stasis ulcer Location: Leg Location Orientation: Right;Posterior Wound Description (Comments): posterior aspect  Present on Admission: Yes   Dressing Type  Compression wrap    Treatment  Cleansed   cleansed and assisted with compression garment   Wound Properties Date First Assessed: 08/03/18 Time First Assessed: 6967 Wound Type: Venous stasis ulcer;Other (Comment) Location: Leg Location Orientation: Right Wound Description (Comments): anterior aspect  Present on Admission: Yes   Dressing Type  Compression wrap    Treatment  Cleansed   cleansed and assisted with compression garment   Wound Properties Date First Assessed: 08/03/18 Time First Assessed: 1510 Wound Type: Venous stasis ulcer;Other (Comment) Location: Leg Location Orientation: Left;Posterior Present on Admission: Yes   Dressing Type  --   medihoney, gauze and medipore tape with short stretch   Dressing Changed  Changed    Dressing Status  Intact;Old drainage    Dressing Change Frequency  PRN    Site / Wound Assessment  Pink    % Wound base Red or Granulating  5%    % Wound base Yellow/Fibrinous Exudate  95%    Wound Length (cm)  7 cm    Wound Width (cm)  2.4 cm    Wound Depth (cm)  0 cm    Wound Volume (cm^3)  0 cm^3    Wound Surface Area (cm^2)  16.8 cm^2    Drainage Amount  Scant    Drainage Description  Serous    Treatment  Cleansed;Debridement (Selective)   medihoney wiht gauze and medipore tape, short stretch   Selective Debridement - Location  posterior LT LE    Selective  Debridement - Tools Used  Forceps;Scalpel    Selective Debridement - Tissue Removed  slough and devitalized tissue    Wound Therapy - Clinical Statement  Pt arrived without dressings on, stated he had testing for circulation earlier today.  Noted 100% adherent slough in Lt posterior wound.  Selective debridement for removal of slough and began medihoney with gauze and medipore tape to address the adherent slough.  Noted redness around wound, discussion held with still on antibiotics.  Evaluation therapist assisted with short stretch dressing on Lt LE.  Continued with assistance  for compression garment on Rt LE, just cleansed no selective debridment needed for Rt LE, just dry skin present.    Wound Therapy - Functional Problem List  Increased pain causing pain with ambulation, difficulty donning and doffing shoes and socks due to edema.     Factors Delaying/Impairing Wound Healing  Infection - systemic/local;Multiple medical problems;Vascular compromise    Hydrotherapy Plan  Debridement;Dressing change;Patient/family education;Other (comment)    Wound Therapy - Frequency  3X / week    Wound Therapy - Current Recommendations  PT    Wound Plan  follow up with garment comfort/fit and ease of getting on/off.  Remeasure volume and if wound on posterior Lt LE healed.    Dressing   Lt: medihoney wiht gauze and medipore tape then short stretch    Dressing  Rt: assisted with compression garment                PT Short Term Goals - 08/08/18 1231      PT SHORT TERM GOAL #1   Title  B LE to have no weeping to reduce risk of infection.     Time  1    Period  Weeks    Status  On-going        PT Long Term Goals - 08/08/18 1231      PT LONG TERM GOAL #1   Title  Pain level in B LE to be no greater than a 2/10 to allow pt to walk for 30 minutes in comfort for work tasks.     Time  4    Period  Weeks    Status  On-going      PT LONG TERM GOAL #2   Title  PT to verbalize the importance of  wearing compression everyday    Time  4    Period  Weeks    Status  On-going      PT LONG TERM GOAL #3   Title  PT wounds to be healed to prevent cellulitis    Time  4    Period  Weeks    Status  On-going      PT LONG TERM GOAL #4   Title  Edema to be decreased to allow pt to don shoes and socks with ease.     Time  4    Period  Weeks    Status  On-going              Patient will benefit from skilled therapeutic intervention in order to improve the following deficits and impairments:     Visit Diagnosis: Pain in left leg  Localized edema  Pain in right leg     Problem List Patient Active Problem List   Diagnosis Date Noted  . CHF exacerbation ([place]) 05/29/2018  . CHF (congestive heart failure) ([place]) 05/27/2018  . Chronic respiratory failure with hypoxia (HCC)/ nocturnal hypoxemia  08/15/2017  . Symptomatic anemia 05/25/2016  . Anemia 05/25/2016  . Hypokalemia 05/25/2016  . Hyponatremia 05/25/2016  . Atrial flutter ([place]), paroxysmal   . Pericardial effusion   . Acute on chronic systolic (congestive) heart failure ([place]) 03/29/2016  . 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 Elevated troponin 03/29/2016  . Restless leg syndrome 03/29/2016  . Presumed cancer of right upper lobe of lung ([place]) 03/29/2016  . Chest pain 03/27/2016  . COPD exacerbation ([place]) 03/27/2016  . Hypertension   . Essential hypertension   . Bradycardia 10/25/2015  .  Heart block AV second degree 10/25/2015  . Accelerated hypertension 10/25/2015  . Hemochromatosis 10/25/2015  . Cigarette smoker 10/25/2015  . Alcohol dependence ([place]) 10/25/2015  . Second degree AV block 10/25/2015   Ihor Austin, LPTA; [place]  Aldona Lento 08/25/2018, 6:48 PM  [place] [address] [place], [place], [address] Phone: [phone]   Fax:  [phone]  Name: Shaun May MRN: [id number] Date of Birth:  20-Aug-1951

## 2018-08-28 ENCOUNTER — Ambulatory Visit (HOSPITAL_COMMUNITY): Payer: Medicare Other | Admitting: Physical Therapy

## 2018-08-28 DIAGNOSIS — M79604 Pain in right leg: Secondary | ICD-10-CM

## 2018-08-28 DIAGNOSIS — R262 Difficulty in walking, not elsewhere classified: Secondary | ICD-10-CM | POA: Diagnosis not present

## 2018-08-28 DIAGNOSIS — R600 Localized edema: Secondary | ICD-10-CM

## 2018-08-28 DIAGNOSIS — S81802S Unspecified open wound, left lower leg, sequela: Secondary | ICD-10-CM | POA: Diagnosis not present

## 2018-08-28 DIAGNOSIS — S81801S Unspecified open wound, right lower leg, sequela: Secondary | ICD-10-CM | POA: Diagnosis not present

## 2018-08-28 DIAGNOSIS — M79605 Pain in left leg: Secondary | ICD-10-CM | POA: Diagnosis not present

## 2018-08-28 NOTE — Therapy (Signed)
[place], [place], [address] Phone: [phone]   Fax:  [phone]  Wound Care Therapy  Patient Details  Name: Shaun May MRN: [id number] Date of Birth: January 27, 1951 Referring Provider: Allyn Kenner   Encounter Date: 08/28/2018  PT End of Session - 08/28/18 1630    Visit Number  9    Number of Visits  12    Date for PT Re-Evaluation  09/02/18    Authorization Type  Medicare A&B    Authorization Time Period  Cert: 2/67--> 12/29/56    Authorization - Visit Number  9    Authorization - Number of Visits  10    PT Start Time  1430    PT Stop Time  1520    PT Time Calculation (min)  50 min    Activity Tolerance  Patient tolerated treatment well    Behavior During Therapy  [facility] for tasks assessed/performed       Past Medical History:  Diagnosis Date  . Alcohol use   . Anemia    when on Xarelto  . Cancer ([place])   . Chronic combined systolic and diastolic CHF (congestive heart failure) ([place])   . CKD (chronic kidney disease), stage II   . COPD (chronic obstructive pulmonary disease) (HCC)    stage 4 COPD  . Dyspnea   . Gastric ulcer   . GI bleeding   . Hematuria   . Hemochromatosis   . Hypertension   . Lung nodule   . Mobitz type 2 second degree heart block    PPM MDT 10/28/15 Dr. Lovena Le  . NICM (nonischemic cardiomyopathy) ([place])   . Paroxysmal atrial flutter ([place])   . Pericardial effusion   . Persistent atrial fibrillation ([place])   . Presence of permanent cardiac pacemaker    Medtronic  . Tobacco abuse     Past Surgical History:  Procedure Laterality Date  . BIOPSY  10/13/2016   Procedure: BIOPSY;  Surgeon: Rogene Houston, MD;  [facility];  Service: Endoscopy;;  esophageal  . CARDIAC CATHETERIZATION N/A 03/31/2016   Procedure: Left Heart Cath and Coronary Angiography;  Surgeon: Wellington Hampshire, MD;  Location: [place] CV LAB;  Service: Cardiovascular;  Laterality: N/A;  . COLONOSCOPY  N/A 10/13/2016   Procedure: COLONOSCOPY;  Surgeon: Rogene Houston, MD;  [facility];  Service: Endoscopy;  Laterality: N/A;  . EP IMPLANTABLE DEVICE N/A 10/28/2015   Procedure: Pacemaker Implant;  Surgeon: Evans Lance, MD;  Location: [place] [facility] LAB;  Service: Cardiovascular;  Laterality: N/A;  . EP IMPLANTABLE DEVICE N/A 11/07/2015   Procedure: Pocket Revision;  Surgeon: Evans Lance, MD;  Location: [place] [facility];  Service: Cardiovascular;  Laterality: N/A;  . ESOPHAGOGASTRODUODENOSCOPY N/A 05/27/2016   Procedure: ESOPHAGOGASTRODUODENOSCOPY (EGD);  Surgeon: Rogene Houston, MD;  [facility];  Service: Endoscopy;  Laterality: N/A;  . ESOPHAGOGASTRODUODENOSCOPY N/A 10/13/2016   Procedure: ESOPHAGOGASTRODUODENOSCOPY (EGD);  Surgeon: Rogene Houston, MD;  [facility];  Service: Endoscopy;  Laterality: N/A;  1:30 - moved to 11/8 @ 1:00 - Ann notified pt  . FUDUCIAL PLACEMENT Right 12/29/2017   Procedure: PLACEMENT OF FUDUCIAL;  Surgeon: [name], MD;  Location: [place];  Service: Thoracic;  Laterality: Right;  . POLYPECTOMY  10/13/2016   Procedure: POLYPECTOMY;  Surgeon: Rogene Houston, MD;  [facility];  Service: Endoscopy;;  transverse colon polypectomy  . TONSILLECTOMY    .  VIDEO BRONCHOSCOPY WITH ENDOBRONCHIAL NAVIGATION Right 12/29/2017   Procedure: VIDEO BRONCHOSCOPY WITH ENDOBRONCHIAL NAVIGATION;  Surgeon: [name], MD;  Location: [place];  Service: Thoracic;  Laterality: Right;    There were no vitals filed for this visit.   Subjective Assessment - 08/28/18 1621    Subjective  Wife states his Rt leg doesn't look good.                 Wound Therapy - 08/28/18 1621    Subjective  Pt arrived with dressings in bag, stated he has testing earlier today to address circulation.  Pt hoping he is discharged this session.      Patient and Family Stated Goals  wounds to heal     Date of Onset  05/25/18     Prior Treatments  self care     Evaluation and Treatment Procedures Explained to Patient/Family  Yes    Evaluation and Treatment Procedures  agreed to    Wound Properties Date First Assessed: 08/03/18 Time First Assessed: 1510 Wound Type: Venous stasis ulcer;Other (Comment) Location: Leg Location Orientation: Left;Posterior Present on Admission: Yes   Dressing Type  Compression wrap;None   medihoney, gauze and medipore tape with short stretch   Dressing Changed  Changed    Dressing Status  Intact;Old drainage    Dressing Change Frequency  PRN    Site / Wound Assessment  Pink    % Wound base Red or Granulating  90%    % Wound base Yellow/Fibrinous Exudate  10%    Drainage Amount  Scant    Drainage Description  Serous    Treatment  Cleansed;Debridement (Selective)    Wound Properties Date First Assessed: 08/03/18 Time First Assessed: 1441 Wound Type: Venous stasis ulcer;Other (Comment) Location: Leg Location Orientation: Right Wound Description (Comments): anterior aspect  Present on Admission: Yes   Dressing Type  Compression wrap    Dressing Changed  Changed    Dressing Status  None    Dressing Change Frequency  PRN    Site / Wound Assessment  Red    % Wound base Red or Granulating  100%   after debridement   Wound Length (cm)  1.2 cm    Wound Width (cm)  1.2 cm    Wound Depth (cm)  0.2 cm    Wound Volume (cm^3)  0.29 cm^3    Wound Surface Area (cm^2)  1.44 cm^2    Drainage Amount  None    Treatment  Cleansed    Selective Debridement - Location  posterior LT LE    Selective Debridement - Tools Used  Forceps;Scalpel    Selective Debridement - Tissue Removed  slough and devitalized tissue    Wound Therapy - Clinical Statement  Pt and wife return today stating his Rt LE is not looking good.  compression stocking on Rt LE and short stretch on Lt LE.  When removed dressings, 100% eschar on posterior Lt LE, however easily removed nearly all with gauze and scapel revealing 90% granulated.   Rt LE is red with more edema today.  urged to get back on antibiotics and wife called MD and requested refill of keflex.  Pt with only one small area on posteriolateral Rt LE, debrided to reveal 100% granulated and used xeroform dressings.  Used profore on both LE today due to time contraints with scheduling longer time to cut new foam pieces.  Pt reported overall comfort.     Wound Therapy - Functional Problem List  Increased pain causing pain with ambulation, difficulty donning and doffing shoes and socks due to edema.     Factors Delaying/Impairing Wound Healing  Infection - systemic/local;Multiple medical problems;Vascular compromise    Hydrotherapy Plan  Debridement;Dressing change;Patient/family education;Other (comment)    Wound Therapy - Frequency  3X / week    Wound Therapy - Current Recommendations  PT    Wound Plan  follow up with Rt LE reduction of redness and edema before going back to garment. Remeasure volume and if wound on posterior Lt LE healed.    Dressing   Lt: xeroform profore    Dressing  Rt: xeroform profore                PT Short Term Goals - 08/08/18 1231      PT SHORT TERM GOAL #1   Title  B LE to have no weeping to reduce risk of infection.     Time  1    Period  Weeks    Status  On-going        PT Long Term Goals - 08/08/18 1231      PT LONG TERM GOAL #1   Title  Pain level in B LE to be no greater than a 2/10 to allow pt to walk for 30 minutes in comfort for work tasks.     Time  4    Period  Weeks    Status  On-going      PT LONG TERM GOAL #2   Title  PT to verbalize the importance of wearing compression everyday    Time  4    Period  Weeks    Status  On-going      PT LONG TERM GOAL #3   Title  PT wounds to be healed to prevent cellulitis    Time  4    Period  Weeks    Status  On-going      PT LONG TERM GOAL #4   Title  Edema to be decreased to allow pt to don shoes and socks with ease.     Time  4    Period  Weeks    Status   On-going              Patient will benefit from skilled therapeutic intervention in order to improve the following deficits and impairments:     Visit Diagnosis: Pain in left leg  Localized edema  Pain in right leg  Difficulty in walking, not elsewhere classified  Wound of right leg, sequela  Wound of left leg, sequela     Problem List Patient Active Problem List   Diagnosis Date Noted  . CHF exacerbation ([place]) 05/29/2018  . CHF (congestive heart failure) ([place]) 05/27/2018  . Chronic respiratory failure with hypoxia (HCC)/ nocturnal hypoxemia  08/15/2017  . Symptomatic anemia 05/25/2016  . Anemia 05/25/2016  . Hypokalemia 05/25/2016  . Hyponatremia 05/25/2016  . Atrial flutter ([place]), paroxysmal   . Pericardial effusion   . Acute on chronic systolic (congestive) heart failure ([place]) 03/29/2016  . 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 Elevated troponin 03/29/2016  . Restless leg syndrome 03/29/2016  . 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 Chest pain 03/27/2016  . COPD exacerbation ([place]) 03/27/2016  . Hypertension   . Essential hypertension   . Bradycardia 10/25/2015  . Heart block AV second degree 10/25/2015  . Accelerated hypertension 10/25/2015  . Hemochromatosis  10/25/2015  . Cigarette smoker 10/25/2015  . Alcohol dependence ([place]) 10/25/2015  . Second degree AV block 10/25/2015   Teena Irani, PTA/CLT [phone]  Teena Irani 08/28/2018, 4:34 PM  [facility] [address] [place], [place], [address] Phone: [phone]   Fax:  [phone]  Name: Shaun May MRN: [id number] Date of Birth: Mar 23, 1951

## 2018-08-30 ENCOUNTER — Telehealth: Payer: Self-pay

## 2018-08-30 ENCOUNTER — Ambulatory Visit (HOSPITAL_COMMUNITY): Payer: Medicare Other | Admitting: Physical Therapy

## 2018-08-30 DIAGNOSIS — M79605 Pain in left leg: Secondary | ICD-10-CM | POA: Diagnosis not present

## 2018-08-30 DIAGNOSIS — R262 Difficulty in walking, not elsewhere classified: Secondary | ICD-10-CM | POA: Diagnosis not present

## 2018-08-30 DIAGNOSIS — R600 Localized edema: Secondary | ICD-10-CM

## 2018-08-30 DIAGNOSIS — S81801S Unspecified open wound, right lower leg, sequela: Secondary | ICD-10-CM

## 2018-08-30 DIAGNOSIS — M79604 Pain in right leg: Secondary | ICD-10-CM

## 2018-08-30 DIAGNOSIS — S81802S Unspecified open wound, left lower leg, sequela: Secondary | ICD-10-CM | POA: Diagnosis not present

## 2018-08-30 NOTE — Therapy (Signed)
[place], [place], [address] Phone: [phone]   Fax:  [phone]  Wound Care Therapy  Patient Details  Name: Shaun May MRN: [id number] Date of Birth: 03/08/1951 Referring Provider: Allyn Kenner   Encounter Date: 08/30/2018  PT End of Session - 08/30/18 1649    Visit Number  10    Number of Visits  12    Date for PT Re-Evaluation  09/02/18    Authorization Type  Medicare A&B    Authorization Time Period  Cert: 8/31--> 04/21/60    Authorization - Visit Number  10    Authorization - Number of Visits  12    PT Start Time  6073    PT Stop Time  1540    PT Time Calculation (min)  65 min    Activity Tolerance  Patient tolerated treatment well    Behavior During Therapy  [facility] for tasks assessed/performed       Past Medical History:  Diagnosis Date  . Alcohol use   . Anemia    when on Xarelto  . Cancer ([place])   . Chronic combined systolic and diastolic CHF (congestive heart failure) ([place])   . CKD (chronic kidney disease), stage II   . COPD (chronic obstructive pulmonary disease) (HCC)    stage 4 COPD  . Dyspnea   . Gastric ulcer   . GI bleeding   . Hematuria   . Hemochromatosis   . Hypertension   . Lung nodule   . Mobitz type 2 second degree heart block    PPM MDT 10/28/15 Dr. Lovena Le  . NICM (nonischemic cardiomyopathy) ([place])   . Paroxysmal atrial flutter ([place])   . Pericardial effusion   . Persistent atrial fibrillation ([place])   . Presence of permanent cardiac pacemaker    Medtronic  . Tobacco abuse     Past Surgical History:  Procedure Laterality Date  . BIOPSY  10/13/2016   Procedure: BIOPSY;  Surgeon: Rogene Houston, MD;  [facility];  Service: Endoscopy;;  esophageal  . CARDIAC CATHETERIZATION N/A 03/31/2016   Procedure: Left Heart Cath and Coronary Angiography;  Surgeon: Wellington Hampshire, MD;  Location: [place] [facility];  Service: Cardiovascular;  Laterality: N/A;  . COLONOSCOPY  N/A 10/13/2016   Procedure: COLONOSCOPY;  Surgeon: Rogene Houston, MD;  [facility];  Service: Endoscopy;  Laterality: N/A;  . EP IMPLANTABLE DEVICE N/A 10/28/2015   Procedure: Pacemaker Implant;  Surgeon: Evans Lance, MD;  Location: [place] [facility];  Service: Cardiovascular;  Laterality: N/A;  . EP IMPLANTABLE DEVICE N/A 11/07/2015   Procedure: Pocket Revision;  Surgeon: Evans Lance, MD;  Location: [place] [facility];  Service: Cardiovascular;  Laterality: N/A;  . ESOPHAGOGASTRODUODENOSCOPY N/A 05/27/2016   Procedure: ESOPHAGOGASTRODUODENOSCOPY (EGD);  Surgeon: Rogene Houston, MD;  [facility];  Service: Endoscopy;  Laterality: N/A;  . ESOPHAGOGASTRODUODENOSCOPY N/A 10/13/2016   Procedure: ESOPHAGOGASTRODUODENOSCOPY (EGD);  Surgeon: Rogene Houston, MD;  [facility];  Service: Endoscopy;  Laterality: N/A;  1:30 - moved to 11/8 @ 1:00 - Ann notified pt  . FUDUCIAL PLACEMENT Right 12/29/2017   Procedure: PLACEMENT OF FUDUCIAL;  Surgeon: [name], MD;  Location: [facility];  Service: Thoracic;  Laterality: Right;  . POLYPECTOMY  10/13/2016   Procedure: POLYPECTOMY;  Surgeon: Rogene Houston, MD;  [facility];  Service: Endoscopy;;  transverse colon polypectomy  . TONSILLECTOMY    .  VIDEO BRONCHOSCOPY WITH ENDOBRONCHIAL NAVIGATION Right 12/29/2017   Procedure: VIDEO BRONCHOSCOPY WITH ENDOBRONCHIAL NAVIGATION;  Surgeon: [name], MD;  Location: [place];  Service: Thoracic;  Laterality: Right;    There were no vitals filed for this visit.              Wound Therapy - 08/30/18 1616    Subjective  dressings intact and bringing short stretch. states never got an antibiotic called in.    Patient and Family Stated Goals  wounds to heal     Date of Onset  05/25/18    Prior Treatments  self care     Evaluation and Treatment Procedures Explained to Patient/Family  Yes    Evaluation and Treatment Procedures  agreed to     Wound Properties Date First Assessed: 08/03/18 Time First Assessed: 1510 Wound Type: Venous stasis ulcer;Other (Comment) Location: Leg Location Orientation: Left;Posterior Present on Admission: Yes   Dressing Type  Compression wrap;None   medihoney, gauze and medipore tape with short stretch   Dressing Status  Intact;Old drainage    Dressing Change Frequency  PRN    Site / Wound Assessment  Pink    % Wound base Red or Granulating  90%    % Wound base Yellow/Fibrinous Exudate  10%    Wound Length (cm)  5 cm   scattered area   Wound Width (cm)  2 cm    Wound Depth (cm)  0.2 cm    Wound Volume (cm^3)  2 cm^3    Wound Surface Area (cm^2)  10 cm^2    Drainage Amount  Scant    Drainage Description  Serous    Treatment  Cleansed;Debridement (Selective)    Wound Properties Date First Assessed: 08/03/18 Time First Assessed: 1441 Wound Type: Venous stasis ulcer;Other (Comment) Location: Leg Location Orientation: Right Wound Description (Comments): anterior aspect  Present on Admission: Yes   Dressing Type  Compression wrap    Dressing Changed  Changed    Dressing Status  None    Dressing Change Frequency  PRN    Site / Wound Assessment  Red    % Wound base Red or Granulating  100%   after debridement   Wound Length (cm)  1 cm    Wound Width (cm)  1 cm    Wound Depth (cm)  0.1 cm    Wound Volume (cm^3)  0.1 cm^3    Wound Surface Area (cm^2)  1 cm^2    Drainage Amount  Minimal    Drainage Description  Serosanguineous    Treatment  Cleansed    Wound Properties Date First Assessed: 08/03/18 Time First Assessed: 1455 Wound Type: Other (Comment);Venous stasis ulcer Location: Leg Location Orientation: Right;Posterior Wound Description (Comments): posterior aspect  Present on Admission: Yes   Dressing Type  Compression wrap    Dressing Changed  Changed    Dressing Status  Clean;Intact    Dressing Change Frequency  PRN    Site / Wound Assessment  Red    % Wound base Red or Granulating  100%     Wound Length (cm)  5 cm   scattered areas without depth   Wound Width (cm)  4 cm    Wound Depth (cm)  0 cm    Wound Volume (cm^3)  0 cm^3    Wound Surface Area (cm^2)  20 cm^2    Drainage Amount  Minimal    Drainage Description  Serous    Treatment  Cleansed;Debridement (Selective)  Selective Debridement - Location  bil LE    Selective Debridement - Tools Used  Forceps;Scalpel    Selective Debridement - Tissue Removed  slough and devitalized tissue    Wound Therapy - Clinical Statement  Removed dressings Rt LE to reveal return of posterior patch of scattered wounds, 100% granulated following debridement of slough and skin.  Redness decreased as well as edema.  Lt posterior wounds all improved with increased granulation and reduced area.  Cut new foam pieces for bilateral LE"s, including full wrap on Lt to see if this reduces blistering between foam. PT reported overall comfort with dressings.     Wound Therapy - Functional Problem List  Increased pain causing pain with ambulation, difficulty donning and doffing shoes and socks due to edema.     Factors Delaying/Impairing Wound Healing  Infection - systemic/local;Multiple medical problems;Vascular compromise    Hydrotherapy Plan  Debridement;Dressing change;Patient/family education;Other (comment)    Wound Therapy - Frequency  3X / week    Wound Therapy - Current Recommendations  PT    Wound Plan  Return to garments when wounds healed.  Have pateint/wife work with using butler before discharging to assure doing correctly.   complete recert next session.     Dressing   bil: xeroform stetch and 1/2" foam    Dressing  --                PT Short Term Goals - 08/08/18 1231      PT SHORT TERM GOAL #1   Title  B LE to have no weeping to reduce risk of infection.     Time  1    Period  Weeks    Status  On-going        PT Long Term Goals - 08/08/18 1231      PT LONG TERM GOAL #1   Title  Pain level in B LE to be no greater than  a 2/10 to allow pt to walk for 30 minutes in comfort for work tasks.     Time  4    Period  Weeks    Status  On-going      PT LONG TERM GOAL #2   Title  PT to verbalize the importance of wearing compression everyday    Time  4    Period  Weeks    Status  On-going      PT LONG TERM GOAL #3   Title  PT wounds to be healed to prevent cellulitis    Time  4    Period  Weeks    Status  On-going      PT LONG TERM GOAL #4   Title  Edema to be decreased to allow pt to don shoes and socks with ease.     Time  4    Period  Weeks    Status  On-going              Patient will benefit from skilled therapeutic intervention in order to improve the following deficits and impairments:     Visit Diagnosis: Pain in left leg  Localized edema  Pain in right leg  Difficulty in walking, not elsewhere classified  Wound of right leg, sequela  Wound of left leg, sequela     Problem List Patient Active Problem List   Diagnosis Date Noted  . CHF exacerbation ([place]) 05/29/2018  . CHF (congestive heart failure) ([place]) 05/27/2018  . Chronic respiratory failure with hypoxia (HCC)/ nocturnal  hypoxemia  08/15/2017  . Symptomatic anemia 05/25/2016  . Anemia 05/25/2016  . Hypokalemia 05/25/2016  . Hyponatremia 05/25/2016  . Atrial flutter ([place]), paroxysmal   . Pericardial effusion   . Acute on chronic systolic (congestive) heart failure ([place]) 03/29/2016  . 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 Elevated troponin 03/29/2016  . Restless leg syndrome 03/29/2016  . 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 Chest pain 03/27/2016  . COPD exacerbation ([place]) 03/27/2016  . Hypertension   . Essential hypertension   . Bradycardia 10/25/2015  . Heart block AV second degree 10/25/2015  . Accelerated hypertension 10/25/2015  . Hemochromatosis 10/25/2015  . Cigarette smoker 10/25/2015  . Alcohol dependence ([place]) 10/25/2015  . Second degree AV block  10/25/2015   Teena Irani, PTA/CLT [id number]  Teena Irani 08/30/2018, 4:51 PM  [facility] [address] [place], [place], [address] Phone: [phone]   Fax:  [phone]  Name: DUSTINE STICKLER MRN: [id number] Date of Birth: 02/04/1951

## 2018-08-30 NOTE — Telephone Encounter (Signed)
PA started on CMM for [place]. Will await determination. Will route to [place] for follow up.     Shaun May (Key: [id number])    Your information has been submitted to [place]. [employer] [place] will review the request and notify you of the determination decision directly, typically within 3 business days of your submission and once all necessary information is received. You will also receive your request decision electronically. To check for an update later, open the request again from your dashboard. If [employer] [place] has not responded within the specified timeframe or if you have any questions about your PA submission, contact [place]  directly at [facility]) [phone] or ([place]) [phone].

## 2018-08-31 ENCOUNTER — Encounter

## 2018-08-31 NOTE — Telephone Encounter (Signed)
Not for now as has trelegy but if problems filling that then we can reconsider

## 2018-08-31 NOTE — Telephone Encounter (Signed)
Dr. Melvyn Novas the patient Shaun May has now been approved but it looks like it has been taken off his list I just wanted to follow up to see if the patient is needing to be on this any longer or if we can disregard this?  Dr. Melvyn Novas please advise Thank you.

## 2018-08-31 NOTE — Telephone Encounter (Signed)
Scott, [place], states that [place] approved for one year effective 9/25. Cb is [id number]

## 2018-08-31 NOTE — Telephone Encounter (Signed)
Called patient unable to reach left message to give us a call back.

## 2018-09-01 ENCOUNTER — Encounter (HOSPITAL_COMMUNITY): Payer: Self-pay

## 2018-09-01 ENCOUNTER — Ambulatory Visit (HOSPITAL_COMMUNITY): Payer: Medicare Other

## 2018-09-01 DIAGNOSIS — M79605 Pain in left leg: Secondary | ICD-10-CM | POA: Diagnosis not present

## 2018-09-01 DIAGNOSIS — M79604 Pain in right leg: Secondary | ICD-10-CM | POA: Diagnosis not present

## 2018-09-01 DIAGNOSIS — R262 Difficulty in walking, not elsewhere classified: Secondary | ICD-10-CM

## 2018-09-01 DIAGNOSIS — S81801S Unspecified open wound, right lower leg, sequela: Secondary | ICD-10-CM | POA: Diagnosis not present

## 2018-09-01 DIAGNOSIS — S81802S Unspecified open wound, left lower leg, sequela: Secondary | ICD-10-CM | POA: Diagnosis not present

## 2018-09-01 DIAGNOSIS — R600 Localized edema: Secondary | ICD-10-CM | POA: Diagnosis not present

## 2018-09-01 NOTE — Therapy (Addendum)
Shaun May, [place], [address] Phone: [phone]   Fax:  [phone]  Wound Care Therapy  Patient Details  Name: Shaun May MRN: [id number] Date of Birth: 1951/07/05 Referring Provider (PT): Allyn Kenner   Encounter Date: 09/01/2018  PT End of Session - 09/01/18 1511    Visit Number  11    Number of Visits  17   Date for PT Re-Evaluation  09/02/18    Authorization Type  Medicare A&B    Authorization Time Period  Cert: 3/78--> 5/88/50    Authorization - Visit Number  11    Authorization - Number of Visits  17   PT Start Time  2774    PT Stop Time  1510    PT Time Calculation (min)  62 min    Activity Tolerance  Patient tolerated treatment well    Behavior During Therapy  [facility] for tasks assessed/performed       Past Medical History:  Diagnosis Date  . Alcohol use   . Anemia    when on Xarelto  . Cancer ([place])   . Chronic combined systolic and diastolic CHF (congestive heart failure) ([place])   . CKD (chronic kidney disease), stage II   . COPD (chronic obstructive pulmonary disease) (HCC)    stage 4 COPD  . Dyspnea   . Gastric ulcer   . GI bleeding   . Hematuria   . Hemochromatosis   . Hypertension   . Lung nodule   . Mobitz type 2 second degree heart block    PPM MDT 10/28/15 Dr. Lovena Le  . NICM (nonischemic cardiomyopathy) ([place])   . Paroxysmal atrial flutter ([place])   . Pericardial effusion   . Persistent atrial fibrillation ([place])   . Presence of permanent cardiac pacemaker    Medtronic  . Tobacco abuse     Past Surgical History:  Procedure Laterality Date  . BIOPSY  10/13/2016   Procedure: BIOPSY;  Surgeon: Rogene Houston, MD;  [facility];  Service: Endoscopy;;  esophageal  . CARDIAC CATHETERIZATION N/A 03/31/2016   Procedure: Left Heart Cath and Coronary Angiography;  Surgeon: Wellington Hampshire, MD;  Location: [place] [facility];  Service: Cardiovascular;  Laterality: N/A;  .  COLONOSCOPY N/A 10/13/2016   Procedure: COLONOSCOPY;  Surgeon: Rogene Houston, MD;  [facility];  Service: Endoscopy;  Laterality: N/A;  . EP IMPLANTABLE DEVICE N/A 10/28/2015   Procedure: Pacemaker Implant;  Surgeon: Evans Lance, MD;  Location: [place] [facility];  Service: Cardiovascular;  Laterality: N/A;  . EP IMPLANTABLE DEVICE N/A 11/07/2015   Procedure: Pocket Revision;  Surgeon: Evans Lance, MD;  Location: [place] [facility];  Service: Cardiovascular;  Laterality: N/A;  . ESOPHAGOGASTRODUODENOSCOPY N/A 05/27/2016   Procedure: ESOPHAGOGASTRODUODENOSCOPY (EGD);  Surgeon: Rogene Houston, MD;  [facility];  Service: Endoscopy;  Laterality: N/A;  . ESOPHAGOGASTRODUODENOSCOPY N/A 10/13/2016   Procedure: ESOPHAGOGASTRODUODENOSCOPY (EGD);  Surgeon: Rogene Houston, MD;  [facility];  Service: Endoscopy;  Laterality: N/A;  1:30 - moved to 11/8 @ 1:00 - Ann notified pt  . FUDUCIAL PLACEMENT Right 12/29/2017   Procedure: PLACEMENT OF FUDUCIAL;  Surgeon: [name], MD;  Location: [facility];  Service: Thoracic;  Laterality: Right;  . POLYPECTOMY  10/13/2016   Procedure: POLYPECTOMY;  Surgeon: Rogene Houston, MD;  [facility];  Service: Endoscopy;;  transverse colon polypectomy  . TONSILLECTOMY    .  VIDEO BRONCHOSCOPY WITH ENDOBRONCHIAL NAVIGATION Right 12/29/2017   Procedure: VIDEO BRONCHOSCOPY WITH ENDOBRONCHIAL NAVIGATION;  Surgeon: [name], MD;  Location: [facility];  Service: Thoracic;  Laterality: Right;    There were no vitals filed for this visit.              Wound Therapy - 09/01/18 1624    Subjective  Pt arrived wiht dressings intact, no reoprts of pain has began antibiotics    Patient and Family Stated Goals  wounds to heal     Date of Onset  05/25/18    Prior Treatments  self care     Evaluation and Treatment Procedures Explained to Patient/Family  Yes    Evaluation and Treatment Procedures  agreed to     Wound Properties Date First Assessed: 08/03/18 Time First Assessed: 2426 Wound Type: Venous stasis ulcer;Other (Comment) Location: Leg Location Orientation: Right Wound Description (Comments): anterior aspect  Present on Admission: Yes   Dressing Type  Impregnated gauze (bismuth);Compression wrap   xeroform with short stretch   Dressing Changed  Reinforced    Dressing Status  None    Dressing Change Frequency  PRN    Site / Wound Assessment  Red    % Wound base Red or Granulating  100%    % Wound base Yellow/Fibrinous Exudate  0%    Peri-wound Assessment  Intact    Wound Length (cm)  1 cm    Wound Width (cm)  1 cm    Wound Depth (cm)  0.1 cm    Wound Volume (cm^3)  0.1 cm^3    Wound Surface Area (cm^2)  1 cm^2    Drainage Amount  Minimal    Drainage Description  Serosanguineous    Treatment  Cleansed;Debridement (Selective)    Wound Properties Date First Assessed: 08/03/18 Time First Assessed: 1455 Wound Type: Other (Comment);Venous stasis ulcer Location: Leg Location Orientation: Right;Posterior Wound Description (Comments): posterior aspect  Present on Admission: Yes   Dressing Type  Impregnated gauze (bismuth);Compression wrap   xeroform and short stretch    Dressing Changed  Changed    Dressing Status  Clean;Intact    Dressing Change Frequency  PRN    Site / Wound Assessment  Red    % Wound base Red or Granulating  100%    % Wound base Yellow/Fibrinous Exudate  0%    Peri-wound Assessment  Intact    Wound Length (cm)  5 cm   scattered wounds with no depth   Wound Width (cm)  4 cm    Wound Depth (cm)  0 cm    Wound Volume (cm^3)  0 cm^3    Wound Surface Area (cm^2)  20 cm^2    Drainage Amount  Minimal    Drainage Description  Serous    Treatment  Cleansed;Debridement (Selective)    Wound Properties Date First Assessed: 08/03/18 Time First Assessed: 1510 Wound Type: Venous stasis ulcer;Other (Comment) Location: Leg Location Orientation: Left;Posterior Present on Admission: Yes    Dressing Type  Impregnated gauze (bismuth)   xeroform with short stretch   Dressing Changed  Changed    Dressing Status  Intact;Old drainage    Dressing Change Frequency  PRN    Site / Wound Assessment  Pink    % Wound base Red or Granulating  95%    % Wound base Yellow/Fibrinous Exudate  5%    Wound Length (cm)  5 cm   scattered area   Wound Width (cm)  1.8  cm    Wound Depth (cm)  0.2 cm    Wound Volume (cm^3)  1.8 cm^3    Wound Surface Area (cm^2)  9 cm^2    Drainage Amount  Scant    Drainage Description  Serous    Treatment  Cleansed;Debridement (Selective)    Selective Debridement - Location  bil LE    Selective Debridement - Tools Used  Forceps;Scalpel    Selective Debridement - Tissue Removed  slough and devitalized tissue    Wound Therapy - Clinical Statement  Removal of dressing indicating improve granulation tissue on posterior wound on Lt LE.  Selective debridement for removal of slough from wound beds.  Continued wiht xeroform to continue moisturizing wound beds to assist with healing.  Continued wiht short stretch dressings completed/ assisted by evaluation therapist.      Wound Therapy - Functional Problem List  Increased pain causing pain with ambulation, difficulty donning and doffing shoes and socks due to edema.     Factors Delaying/Impairing Wound Healing  Infection - systemic/local;Multiple medical problems;Vascular compromise    Hydrotherapy Plan  Debridement;Dressing change;Patient/family education;Other (comment)    Wound Therapy - Frequency  3X / week for an additional 2 weeks.    Wound Therapy - Current Recommendations  PT    Wound Plan  Return to garments when wounds healed.  Have pateint/wife work with using butler before discharging to assure doing correctly.     Dressing   bil: xeroform stetch and 1/2" foam                PT Short Term Goals - 08/08/18 1231      PT SHORT TERM GOAL #1   Title  B LE to have no weeping to reduce risk of  infection.     Time  1    Period  Weeks    Status  met        PT Long Term Goals - 08/08/18 1231      PT LONG TERM GOAL #1   Title  Pain level in B LE to be no greater than a 2/10 to allow pt to walk for 30 minutes in comfort for work tasks.     Time  4    Period  Weeks    Status  met      PT LONG TERM GOAL #2   Title  PT to verbalize the importance of wearing compression everyday    Time  4    Period  Weeks    Status  met     PT LONG TERM GOAL #3   Title  PT wounds to be healed to prevent cellulitis    Time  4    Period  Weeks    Status  On-going      PT LONG TERM GOAL #4   Title  Edema to be decreased to allow pt to don shoes and socks with ease.     Time  4    Period  Weeks    Status  met              Patient will benefit from skilled therapeutic intervention in order to improve the following deficits and impairments:     Visit Diagnosis: Pain in right leg  Difficulty in walking, not elsewhere classified  Pain in left leg  Localized edema     Problem List Patient Active Problem List   Diagnosis Date Noted  . CHF exacerbation ([place]) 05/29/2018  . CHF (  congestive heart failure) ([place]) 05/27/2018  . Chronic respiratory failure with hypoxia (HCC)/ nocturnal hypoxemia  08/15/2017  . Symptomatic anemia 05/25/2016  . Anemia 05/25/2016  . Hypokalemia 05/25/2016  . Hyponatremia 05/25/2016  . Atrial flutter ([place]), paroxysmal   . Pericardial effusion   . Acute on chronic systolic (congestive) heart failure ([place]) 03/29/2016  . 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 Elevated troponin 03/29/2016  . Restless leg syndrome 03/29/2016  . 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 Chest pain 03/27/2016  . COPD exacerbation ([place]) 03/27/2016  . Hypertension   . Essential hypertension   . Bradycardia 10/25/2015  . Heart block AV second degree 10/25/2015  . Accelerated hypertension 10/25/2015  . Hemochromatosis  10/25/2015  . Cigarette smoker 10/25/2015  . Alcohol dependence ([place]) 10/25/2015  . Second degree AV block 10/25/2015   [place]; CBIS [phone] Met  09/01/2018, 5:19 PM  [facility] [address] [place], [place], [address] Phone: [phone]   Fax:  [phone]  Name: KHAIDYN STAEBELL MRN: [id number] Date of Birth: [birth year]

## 2018-09-04 ENCOUNTER — Ambulatory Visit (HOSPITAL_COMMUNITY): Payer: Medicare Other | Admitting: Physical Therapy

## 2018-09-04 ENCOUNTER — Telehealth: Payer: Self-pay | Admitting: *Deleted

## 2018-09-04 DIAGNOSIS — S81801S Unspecified open wound, right lower leg, sequela: Secondary | ICD-10-CM | POA: Diagnosis not present

## 2018-09-04 DIAGNOSIS — R262 Difficulty in walking, not elsewhere classified: Secondary | ICD-10-CM

## 2018-09-04 DIAGNOSIS — S81802S Unspecified open wound, left lower leg, sequela: Secondary | ICD-10-CM

## 2018-09-04 DIAGNOSIS — M79605 Pain in left leg: Secondary | ICD-10-CM

## 2018-09-04 DIAGNOSIS — R600 Localized edema: Secondary | ICD-10-CM

## 2018-09-04 DIAGNOSIS — M79604 Pain in right leg: Secondary | ICD-10-CM | POA: Diagnosis not present

## 2018-09-04 NOTE — Telephone Encounter (Signed)
CALLED PATIENT TO INFORM OF CT FOR 10-12-18- ARRIVAL TIME - 1:45 PM @ [facility] , [place] - 4 HRS. PRIOR TO TEST, PT. TO GET RESULTS FROM ASHLYN BRUNING ON 10-13-18, SPOKE WITH PATIENT AND HE IS AWARE OF THESE APPTS.

## 2018-09-04 NOTE — Therapy (Addendum)
[place], [place], [address] Phone: [phone]   Fax:  [phone]  Wound Care Therapy  Patient Details  Name: Shaun May MRN: [id number] Date of Birth: 23-Aug-1951 Referring Provider (PT): Allyn Kenner   Encounter Date: 09/04/2018  PT End of Session - 09/04/18 1603    Visit Number  12    Number of Visits  18   Date for PT Re-Evaluation  09/02/18    Authorization Type  Medicare A&B    Authorization Time Period  Cert: 6/96/2952-84/13/2440   Authorization - Visit Number  12    Authorization - Number of Visits  12    PT Start Time  1430    PT Stop Time  1525    PT Time Calculation (min)  55 min    Activity Tolerance  Patient tolerated treatment well    Behavior During Therapy  [facility] for tasks assessed/performed       Past Medical History:  Diagnosis Date  . Alcohol use   . Anemia    when on Xarelto  . Cancer ([place])   . Chronic combined systolic and diastolic CHF (congestive heart failure) ([place])   . CKD (chronic kidney disease), stage II   . COPD (chronic obstructive pulmonary disease) (HCC)    stage 4 COPD  . Dyspnea   . Gastric ulcer   . GI bleeding   . Hematuria   . Hemochromatosis   . Hypertension   . Lung nodule   . Mobitz type 2 second degree heart block    PPM MDT 10/28/15 Dr. Lovena Le  . NICM (nonischemic cardiomyopathy) ([place])   . Paroxysmal atrial flutter ([place])   . Pericardial effusion   . Persistent atrial fibrillation ([place])   . Presence of permanent cardiac pacemaker    Medtronic  . Tobacco abuse     Past Surgical History:  Procedure Laterality Date  . BIOPSY  10/13/2016   Procedure: BIOPSY;  Surgeon: Rogene Houston, MD;  [facility];  Service: Endoscopy;;  esophageal  . CARDIAC CATHETERIZATION N/A 03/31/2016   Procedure: Left Heart Cath and Coronary Angiography;  Surgeon: Wellington Hampshire, MD;  Location: [place] [facility];  Service: Cardiovascular;  Laterality: N/A;  .  COLONOSCOPY N/A 10/13/2016   Procedure: COLONOSCOPY;  Surgeon: Rogene Houston, MD;  [facility];  Service: Endoscopy;  Laterality: N/A;  . EP IMPLANTABLE DEVICE N/A 10/28/2015   Procedure: Pacemaker Implant;  Surgeon: Evans Lance, MD;  Location: [place] [facility];  Service: Cardiovascular;  Laterality: N/A;  . EP IMPLANTABLE DEVICE N/A 11/07/2015   Procedure: Pocket Revision;  Surgeon: Evans Lance, MD;  Location: [place] [facility];  Service: Cardiovascular;  Laterality: N/A;  . ESOPHAGOGASTRODUODENOSCOPY N/A 05/27/2016   Procedure: ESOPHAGOGASTRODUODENOSCOPY (EGD);  Surgeon: Rogene Houston, MD;  [facility];  Service: Endoscopy;  Laterality: N/A;  . ESOPHAGOGASTRODUODENOSCOPY N/A 10/13/2016   Procedure: ESOPHAGOGASTRODUODENOSCOPY (EGD);  Surgeon: Rogene Houston, MD;  [facility];  Service: Endoscopy;  Laterality: N/A;  1:30 - moved to 11/8 @ 1:00 - Ann notified pt  . FUDUCIAL PLACEMENT Right 12/29/2017   Procedure: PLACEMENT OF FUDUCIAL;  Surgeon: [name], MD;  Location: [place];  Service: Thoracic;  Laterality: Right;  . POLYPECTOMY  10/13/2016   Procedure: POLYPECTOMY;  Surgeon: Rogene Houston, MD;  [facility];  Service: Endoscopy;;  transverse colon polypectomy  . TONSILLECTOMY    .  VIDEO BRONCHOSCOPY WITH ENDOBRONCHIAL NAVIGATION Right 12/29/2017   Procedure: VIDEO BRONCHOSCOPY WITH ENDOBRONCHIAL NAVIGATION;  Surgeon: [name], MD;  Location: [place];  Service: Thoracic;  Laterality: Right;    There were no vitals filed for this visit.              Wound Therapy - 09/04/18 1555    Subjective  Pt doing well with dressings still intact.  No pain.     Patient and Family Stated Goals  wounds to heal     Date of Onset  05/25/18    Prior Treatments  self care     Evaluation and Treatment Procedures Explained to Patient/Family  Yes    Evaluation and Treatment Procedures  agreed to    Wound Properties  Date First Assessed: 08/03/18 Time First Assessed: 1510 Wound Type: Venous stasis ulcer;Other (Comment) Location: Leg Location Orientation: Left;Posterior Present on Admission: Yes   Dressing Type  Impregnated gauze (bismuth)   xeroform with short stretch   Dressing Changed  Changed    Dressing Status  Intact;Old drainage    Dressing Change Frequency  PRN    Site / Wound Assessment  Pink    % Wound base Red or Granulating  100%    % Wound base Yellow/Fibrinous Exudate  5%    Drainage Amount  Scant    Drainage Description  Serous    Treatment  Cleansed;Debridement (Selective)    Selective Debridement - Location  posterior Lt LE    Selective Debridement - Tools Used  Forceps    Selective Debridement - Tissue Removed  slough and devitalized tissue    Wound Therapy - Clinical Statement  Rt LE is completely healed at this point.  Lt LE nearly all healed with exception of 2 very small raw areas, approximately 0.2cm each that when dry skin removed from perimeter revealed approximation around borders.  very little slough present and able to debride to 100% granulation.  continued with short stretch on Lt, however had patient and wife donn stocking on Rt with garment butler.      Wound Therapy - Functional Problem List  Increased pain causing pain with ambulation, difficulty donning and doffing shoes and socks due to edema.     Factors Delaying/Impairing Wound Healing  Infection - systemic/local;Multiple medical problems;Vascular compromise    Hydrotherapy Plan  Debridement;Dressing change;Patient/family education;Other (comment)    Wound Therapy - Frequency  3X / week    Wound Therapy - Current Recommendations  PT    Wound Plan  Assess how garment is working on Rt LE.  Don garment on Lt LE as well if completely healed next session.  He may then be discharged.     Dressing   Rt LE:  stocking    Dressing  Lt LE:  xeroform, 1/2" foam and multilayer short stretch.              PT Education -  09/04/18 1603    Education Details  using sock butler on Rt; educated in care of garment, donning/doffing and moisturixing LE.     Person(s) Educated  Patient    Methods  Explanation    Comprehension  Verbalized understanding       PT Short Term Goals - 09/04/18 1606      PT SHORT TERM GOAL #1   Title  B LE to have no weeping to reduce risk of infection.     Time  1    Period  Weeks  Status  Achieved        PT Long Term Goals - 09/04/18 1606      PT LONG TERM GOAL #1   Title  Pain level in B LE to be no greater than a 2/10 to allow pt to walk for 30 minutes in comfort for work tasks.     Time  4    Period  Weeks    Status  Achieved      PT LONG TERM GOAL #2   Title  PT to verbalize the importance of wearing compression everyday    Time  4    Period  Weeks    Status  On-going      PT LONG TERM GOAL #3   Title  PT wounds to be healed to prevent cellulitis    Time  4    Period  Weeks    Status  Partially Met      PT LONG TERM GOAL #4   Title  Edema to be decreased to allow pt to don shoes and socks with ease.     Time  4    Period  Weeks    Status  Achieved              Patient will benefit from skilled therapeutic intervention in order to improve the following deficits and impairments:     Visit Diagnosis: Pain in right leg  Difficulty in walking, not elsewhere classified  Pain in left leg  Localized edema  Wound of right leg, sequela  Wound of left leg, sequela     Problem List Patient Active Problem List   Diagnosis Date Noted  . CHF exacerbation ([place]) 05/29/2018  . CHF (congestive heart failure) ([place]) 05/27/2018  . Chronic respiratory failure with hypoxia (HCC)/ nocturnal hypoxemia  08/15/2017  . Symptomatic anemia 05/25/2016  . Anemia 05/25/2016  . Hypokalemia 05/25/2016  . Hyponatremia 05/25/2016  . Atrial flutter ([place]), paroxysmal   . Pericardial effusion   . Acute on chronic systolic (congestive) heart failure ([place]) 03/29/2016   . 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 Elevated troponin 03/29/2016  . Restless leg syndrome 03/29/2016  . Presumed cancer of right upper lobe of lung ([place]) 03/29/2016  . Chest pain 03/27/2016  . COPD exacerbation ([place]) 03/27/2016  . Hypertension   . Essential hypertension   . Bradycardia 10/25/2015  . Heart block AV second degree 10/25/2015  . Accelerated hypertension 10/25/2015  . Hemochromatosis 10/25/2015  . Cigarette smoker 10/25/2015  . Alcohol dependence ([place]) 10/25/2015  . Second degree AV block 10/25/2015   Teena Irani, PTA/CLT [phone]  Teena Irani 09/04/2018, 4:07 PM  [facility] [address] [place], [place], [address] Phone: [phone]   Fax:  [phone]  Name: AALIJAH LANPHERE MRN: [id number] Date of Birth: 11/06/1951

## 2018-09-05 ENCOUNTER — Encounter

## 2018-09-05 NOTE — Addendum Note (Signed)
Addended by: Leeroy Cha on: 09/05/2018 03:33 PM   Modules accepted: Orders

## 2018-09-06 ENCOUNTER — Encounter

## 2018-09-06 ENCOUNTER — Ambulatory Visit (HOSPITAL_COMMUNITY): Payer: Medicare Other | Admitting: Physical Therapy

## 2018-09-06 ENCOUNTER — Telehealth (HOSPITAL_COMMUNITY): Payer: Self-pay | Admitting: Internal Medicine

## 2018-09-06 NOTE — Telephone Encounter (Signed)
09/06/18  Amy out sick and I told him that if something opened up this afternoon I would call him

## 2018-09-06 NOTE — Telephone Encounter (Signed)
Called and spoke with patient. He states he is taking Anoro he has 13 days left and then will start the Trelegy. He also mentioned that he received a POC from his primary care doctor.  The office called and cancelled his 6 minute walk due to MD out of office. But a follow up wasn't rescheduled, and he wants to wait until he is done with rehab for swelling of his legs. I let him know that when he calls back for his follow up appointment to schedule a PFT also per last office note.  He verbalized an understanding.   Route to MW as Juluis Rainier or if patient needs to be seen sooner.

## 2018-09-06 NOTE — Telephone Encounter (Signed)
Pt is calling back [phone]

## 2018-09-07 ENCOUNTER — Ambulatory Visit: Payer: Self-pay | Admitting: Urology

## 2018-09-08 ENCOUNTER — Ambulatory Visit: Payer: Medicare Other | Admitting: Urology

## 2018-09-08 ENCOUNTER — Ambulatory Visit (HOSPITAL_COMMUNITY): Payer: Medicare Other | Attending: Internal Medicine

## 2018-09-08 ENCOUNTER — Encounter (HOSPITAL_COMMUNITY): Payer: Self-pay

## 2018-09-08 DIAGNOSIS — M79604 Pain in right leg: Secondary | ICD-10-CM | POA: Diagnosis not present

## 2018-09-08 DIAGNOSIS — R262 Difficulty in walking, not elsewhere classified: Secondary | ICD-10-CM | POA: Diagnosis not present

## 2018-09-08 DIAGNOSIS — R600 Localized edema: Secondary | ICD-10-CM | POA: Insufficient documentation

## 2018-09-08 DIAGNOSIS — M79605 Pain in left leg: Secondary | ICD-10-CM | POA: Insufficient documentation

## 2018-09-08 NOTE — Therapy (Addendum)
[facility] [address] [place], [place], [address] Phone: [phone]   Fax:  [phone]  Wound Care Therapy  Patient Details  Name: Shaun May MRN: [id number] Date of Birth: Apr 11, 1951 Referring Provider (PT): Allyn Kenner   Encounter Date: 09/08/2018 PHYSICAL THERAPY DISCHARGE SUMMARY  Visits from Start of Care: 13  Current functional level related to goals / functional outcomes: See below   Remaining deficits: None    Education / Equipment: Donning compression garment  Plan: Patient agrees to discharge.  Patient goals were met. Patient is being discharged due to meeting the stated rehab goals.  ?????      PT End of Session - 09/08/18 1356    Visit Number  13    Number of Visits  18    Date for PT Re-Evaluation  09/02/18    Authorization Type  Medicare A&B    Authorization Time Period  Cert: 2/45--> 07/14/97; 2nd cert: 3/38/25 - 05/39/76    Authorization - Visit Number  13    Authorization - Number of Visits  18    PT Start Time  1300    PT Stop Time  1328    PT Time Calculation (min)  28 min    Activity Tolerance  Patient tolerated treatment well    Behavior During Therapy  WFL for tasks assessed/performed       Past Medical History:  Diagnosis Date  . Alcohol use   . Anemia    when on Xarelto  . Cancer ([place])   . Chronic combined systolic and diastolic CHF (congestive heart failure) ([place])   . CKD (chronic kidney disease), stage II   . COPD (chronic obstructive pulmonary disease) (HCC)    stage 4 COPD  . Dyspnea   . Gastric ulcer   . GI bleeding   . Hematuria   . Hemochromatosis   . Hypertension   . Lung nodule   . Mobitz type 2 second degree heart block    PPM MDT 10/28/15 Dr. Lovena Le  . NICM (nonischemic cardiomyopathy) ([place])   . Paroxysmal atrial flutter ([place])   . Pericardial effusion   . Persistent atrial fibrillation   . Presence of permanent cardiac pacemaker    [employer]tr[employer]  . Tobacco abuse      Past Surgical History:  Procedure Laterality Date  . BIOPSY  10/13/2016   Procedure: BIOPSY;  Surgeon: Rogene Houston, MD;  [facility];  Service: Endoscopy;;  esophageal  . CARDIAC CATHETERIZATION N/A 03/31/2016   Procedure: Left Heart Cath and Coronary Angiography;  Surgeon: Wellington Hampshire, MD;  Location: [facility] CV LAB;  Service: Cardiovascular;  Laterality: N/A;  . COLONOSCOPY N/A 10/13/2016   Procedure: COLONOSCOPY;  Surgeon: Rogene Houston, MD;  [facility];  Service: Endoscopy;  Laterality: N/A;  . EP IMPLANTABLE DEVICE N/A 10/28/2015   Procedure: Pacemaker Implant;  Surgeon: Evans Lance, MD;  Location: [facility] [facility];  Service: Cardiovascular;  Laterality: N/A;  . EP IMPLANTABLE DEVICE N/A 11/07/2015   Procedure: Pocket Revision;  Surgeon: Evans Lance, MD;  Location: [facility];  Service: Cardiovascular;  Laterality: N/A;  . ESOPHAGOGASTRODUODENOSCOPY N/A 05/27/2016   Procedure: ESOPHAGOGASTRODUODENOSCOPY (EGD);  Surgeon: Rogene Houston, MD;  [facility];  Service: Endoscopy;  Laterality: N/A;  . ESOPHAGOGASTRODUODENOSCOPY N/A 10/13/2016   Procedure: ESOPHAGOGASTRODUODENOSCOPY (EGD);  Surgeon: Rogene Houston, MD;  [facility];  Service: Endoscopy;  Laterality: N/A;  1:30 - moved to 11/8 @ 1:00 - Ann notified pt  . FUDUCIAL PLACEMENT Right 12/29/2017   Procedure: PLACEMENT OF FUDUCIAL;  Surgeon: Hendrickson, Steven C, MD;  [facility];  Service: Thoracic;  Laterality: Right;  . POLYPECTOMY  10/13/2016   Procedure: POLYPECTOMY;  Surgeon: Najeeb U Rehman, MD;  [facility];  Service: Endoscopy;;  transverse colon polypectomy  . TONSILLECTOMY    . VIDEO BRONCHOSCOPY WITH ENDOBRONCHIAL NAVIGATION Right 12/29/2017   Procedure: VIDEO BRONCHOSCOPY WITH ENDOBRONCHIAL NAVIGATION;  Surgeon: Hendrickson, Steven C, MD;  [facility];  Service: Thoracic;  Laterality: Right;    There were no vitals filed for  this visit.   Subjective Assessment - 09/08/18 1341    Subjective  Pt. stated he's ready for the dressings to be removed.  Reports wife was helping wiht the Rt LE stocking and scratched leg, have dressings over.      Patient Stated Goals  less pain     Currently in Pain?  No/denies                Wound Therapy - 09/08/18 1345    Subjective  Pt. stated he's ready for the dressings to be removed.  Reports wife was helping wiht the Rt LE stocking and scratched leg, have dressings over.      Patient and Family Stated Goals  wounds to heal     Date of Onset  05/25/18    Prior Treatments  self care     Pain Scale  0-10    Pain Score  0-No pain    Wound Properties Date First Assessed: 08/03/18 Time First Assessed: 1441 Wound Type: Venous stasis ulcer;Other (Comment) Location: Leg Location Orientation: Right Wound Description (Comments): anterior aspect  Present on Admission: Yes Final Assessment Date: 09/08/18 Final Assessment Time: 1330   Dressing Type  --   stockings   Dressing Changed  Changed   changed to stockings   Dressing Status  None    Dressing Change Frequency  PRN    Site / Wound Assessment  Granulation tissue    % Wound base Red or Granulating  100%   fully healed   Wound Length (cm)  0 cm    Wound Width (cm)  0 cm    Wound Depth (cm)  0 cm    Wound Volume (cm^3)  0 cm^3    Wound Surface Area (cm^2)  0 cm^2    Drainage Amount  None    Treatment  Other (Comment);Cleansed   cleansed and revealed fully healed, assisted wiht stockings   Wound Properties Date First Assessed: 08/03/18 Time First Assessed: 1455 Wound Type: Other (Comment);Venous stasis ulcer Location: Leg Location Orientation: Right;Posterior Wound Description (Comments): posterior aspect  Present on Admission: Yes Final Assessment Date: 09/08/18 Final Assessment Time: 1330   Dressing Type  --   stockings   Dressing Changed  Changed   stockings placed   Dressing Status  None    % Wound base Red or  Granulating  100%    % Wound base Yellow/Fibrinous Exudate  0%    Wound Length (cm)  0 cm    Wound Width (cm)  0 cm    Wound Depth (cm)  0 cm    Wound Volume (cm^3)  0 cm^3    Wound Surface Area (cm^2)  0 cm^2    Drainage Amount  None    Treatment  Cleansed   no tx to Rt LE, pt arrived wiht dressings   intact   Wound Properties Date First Assessed: 08/03/18 Time First Assessed: 1510 Wound Type: Venous stasis ulcer;Other (Comment) Location: Leg Location Orientation: Left;Posterior Present on Admission: Yes Final Assessment Date: 09/08/18 Final Assessment Time: 1330   Dressing Type  --   stockings   Dressing Changed  Changed   stockings   Dressing Status  Clean    Site / Wound Assessment  Granulation tissue    % Wound base Red or Granulating  100%   fully healed upon removal of dressings, assisted with stocki   Wound Length (cm)  0 cm    Wound Width (cm)  0 cm    Wound Depth (cm)  0 cm    Wound Volume (cm^3)  0 cm^3    Wound Surface Area (cm^2)  0 cm^2    Drainage Amount  None    Treatment  Cleansed    Wound Therapy - Clinical Statement  Upon removal of dressings all wounds healed BLE.  Educated proper cleansing and wear time with stocking with some assistance to place stocking on Lt LE.    Wound Therapy - Functional Problem List  Increased pain causing pain with ambulation, difficulty donning and doffing shoes and socks due to edema.     Factors Delaying/Impairing Wound Healing  Infection - systemic/local;Multiple medical problems;Vascular compromise    Hydrotherapy Plan  Debridement;Dressing change;Patient/family education;Other (comment)    Wound Therapy - Frequency  3X / week    Wound Therapy - Current Recommendations  PT    Wound Plan  DC per all wound healed    Dressing   Rt and Lt LE stockings                PT Short Term Goals - 09/04/18 1606      PT SHORT TERM GOAL #1   Title  B LE to have no weeping to reduce risk of infection.     Time  1    Period  Weeks     Status  Achieved        PT Long Term Goals - 09/04/18 1606      PT LONG TERM GOAL #1   Title  Pain level in B LE to be no greater than a 2/10 to allow pt to walk for 30 minutes in comfort for work tasks.     Time  4    Period  Weeks    Status  Achieved      PT LONG TERM GOAL #2   Title  PT to verbalize the importance of wearing compression everyday    Time  4    Period  Weeks    Status Achieved      PT LONG TERM GOAL #3   Title  PT wounds to be healed to prevent cellulitis    Time  4    Period  Weeks    Status Achieved      PT LONG TERM GOAL #4   Title  Edema to be decreased to allow pt to don shoes and socks with ease.     Time  4    Period  Weeks    Status  Achieved              Patient will benefit from skilled therapeutic intervention in order to improve the following deficits and impairments:     Visit Diagnosis: Pain in right leg  Difficulty in walking, not elsewhere classified  Pain in left leg  Localized edema       Problem List Patient Active Problem List   Diagnosis Date Noted  . CHF exacerbation (HCC) 05/29/2018  . CHF (congestive heart failure) (HCC) 05/27/2018  . Chronic respiratory failure with hypoxia (HCC)/ nocturnal hypoxemia  08/15/2017  . Symptomatic anemia 05/25/2016  . Anemia 05/25/2016  . Hypokalemia 05/25/2016  . Hyponatremia 05/25/2016  . Atrial flutter (HCC), paroxysmal   . Pericardial effusion   . Acute on chronic systolic (congestive) heart failure (HCC) 03/29/2016  . 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 Elevated troponin 03/29/2016  . Restless leg syndrome 03/29/2016  . 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 Chest pain 03/27/2016  . COPD exacerbation (HCC) 03/27/2016  . Hypertension   . Essential hypertension   . Bradycardia 10/25/2015  . Heart block AV second degree 10/25/2015  . Accelerated hypertension 10/25/2015  . Hemochromatosis 10/25/2015  . Cigarette smoker  10/25/2015  . Alcohol dependence (HCC) 10/25/2015  . Second degree AV block 10/25/2015   Casey Cockerham, LPTA; CBIS [phone]  Cynthia Russell, PT CLT [phone] 09/08/2018, 2:00 PM  [facility] [address] [place], [place], [address] Phone: [phone]   Fax:  [phone]  Name: Roshon T Papillion MRN: [id number] Date of Birth: 10/01/1951    

## 2018-09-11 ENCOUNTER — Ambulatory Visit (HOSPITAL_COMMUNITY): Payer: Medicare Other | Admitting: Physical Therapy

## 2018-09-12 DIAGNOSIS — Z6826 Body mass index (BMI) 26.0-26.9, adult: Secondary | ICD-10-CM | POA: Diagnosis not present

## 2018-09-12 DIAGNOSIS — Z85118 Personal history of other malignant neoplasm of bronchus and lung: Secondary | ICD-10-CM | POA: Diagnosis not present

## 2018-09-12 DIAGNOSIS — Z9981 Dependence on supplemental oxygen: Secondary | ICD-10-CM | POA: Diagnosis not present

## 2018-09-12 DIAGNOSIS — D179 Benign lipomatous neoplasm, unspecified: Secondary | ICD-10-CM | POA: Diagnosis not present

## 2018-09-12 DIAGNOSIS — I10 Essential (primary) hypertension: Secondary | ICD-10-CM | POA: Diagnosis not present

## 2018-09-12 DIAGNOSIS — R944 Abnormal results of kidney function studies: Secondary | ICD-10-CM | POA: Diagnosis not present

## 2018-09-12 DIAGNOSIS — F411 Generalized anxiety disorder: Secondary | ICD-10-CM | POA: Diagnosis not present

## 2018-09-12 DIAGNOSIS — I5032 Chronic diastolic (congestive) heart failure: Secondary | ICD-10-CM | POA: Diagnosis not present

## 2018-09-12 DIAGNOSIS — R600 Localized edema: Secondary | ICD-10-CM | POA: Diagnosis not present

## 2018-09-12 DIAGNOSIS — R911 Solitary pulmonary nodule: Secondary | ICD-10-CM | POA: Diagnosis not present

## 2018-09-12 DIAGNOSIS — J449 Chronic obstructive pulmonary disease, unspecified: Secondary | ICD-10-CM | POA: Diagnosis not present

## 2018-09-12 DIAGNOSIS — L03119 Cellulitis of unspecified part of limb: Secondary | ICD-10-CM | POA: Diagnosis not present

## 2018-09-12 DIAGNOSIS — D631 Anemia in chronic kidney disease: Secondary | ICD-10-CM | POA: Diagnosis not present

## 2018-09-12 DIAGNOSIS — Z8639 Personal history of other endocrine, nutritional and metabolic disease: Secondary | ICD-10-CM | POA: Diagnosis not present

## 2018-09-12 DIAGNOSIS — G894 Chronic pain syndrome: Secondary | ICD-10-CM | POA: Diagnosis not present

## 2018-09-12 DIAGNOSIS — J441 Chronic obstructive pulmonary disease with (acute) exacerbation: Secondary | ICD-10-CM | POA: Diagnosis not present

## 2018-09-12 DIAGNOSIS — J060 Acute laryngopharyngitis: Secondary | ICD-10-CM | POA: Diagnosis not present

## 2018-09-12 DIAGNOSIS — Z0000 Encounter for general adult medical examination without abnormal findings: Secondary | ICD-10-CM | POA: Diagnosis not present

## 2018-09-12 DIAGNOSIS — Z6829 Body mass index (BMI) 29.0-29.9, adult: Secondary | ICD-10-CM | POA: Diagnosis not present

## 2018-09-12 DIAGNOSIS — R918 Other nonspecific abnormal finding of lung field: Secondary | ICD-10-CM | POA: Diagnosis not present

## 2018-09-13 ENCOUNTER — Ambulatory Visit (HOSPITAL_COMMUNITY): Payer: Medicare Other | Admitting: Physical Therapy

## 2018-09-15 ENCOUNTER — Ambulatory Visit (HOSPITAL_COMMUNITY): Payer: Medicare Other | Admitting: Physical Therapy

## 2018-09-18 ENCOUNTER — Ambulatory Visit (HOSPITAL_COMMUNITY): Payer: Medicare Other | Admitting: Physical Therapy

## 2018-09-19 ENCOUNTER — Telehealth (INDEPENDENT_AMBULATORY_CARE_PROVIDER_SITE_OTHER): Payer: Self-pay | Admitting: Internal Medicine

## 2018-09-19 NOTE — Telephone Encounter (Signed)
This morning having a terrible time and wasBRB dripping blood this blood this morning and saw blood yesterday morning. He has gone this afternoon and has saw no blood.  Patient has been given appointment to see Lelon Perla 09/20/2018 @8 :8.  Please call the patient and let him know, thank you.

## 2018-09-19 NOTE — Telephone Encounter (Signed)
Patient is having some rectal bleeding - has some questions - please call at [phone]

## 2018-09-20 ENCOUNTER — Ambulatory Visit (INDEPENDENT_AMBULATORY_CARE_PROVIDER_SITE_OTHER): Payer: Medicare Other | Admitting: Internal Medicine

## 2018-09-20 ENCOUNTER — Encounter (INDEPENDENT_AMBULATORY_CARE_PROVIDER_SITE_OTHER): Payer: Self-pay | Admitting: Internal Medicine

## 2018-09-20 ENCOUNTER — Ambulatory Visit (HOSPITAL_COMMUNITY): Payer: Medicare Other | Admitting: Physical Therapy

## 2018-09-20 VITALS — BP 140/90 | HR 60 | Temp 98.0°F | Ht 70.0 in | Wt 197.0 lb

## 2018-09-20 DIAGNOSIS — K625 Hemorrhage of anus and rectum: Secondary | ICD-10-CM | POA: Diagnosis not present

## 2018-09-20 DIAGNOSIS — I2584 Coronary atherosclerosis due to calcified coronary lesion: Secondary | ICD-10-CM | POA: Diagnosis not present

## 2018-09-20 DIAGNOSIS — I2510 Atherosclerotic heart disease of native coronary artery without angina pectoris: Secondary | ICD-10-CM

## 2018-09-20 LAB — CBC WITH DIFFERENTIAL/PLATELET
BASOS PCT: 0.3 %
Basophils Absolute: 18 cells/uL (ref 0–200)
EOS PCT: 0.8 %
Eosinophils Absolute: 48 cells/uL (ref 15–500)
HCT: 37.9 % — ABNORMAL LOW (ref 38.5–50.0)
Hemoglobin: 12 g/dL — ABNORMAL LOW (ref 13.2–17.1)
LYMPHS ABS: 546 {cells}/uL — AB (ref 850–3900)
MCH: 29.2 pg (ref 27.0–33.0)
MCHC: 31.7 g/dL — ABNORMAL LOW (ref 32.0–36.0)
MCV: 92.2 fL (ref 80.0–100.0)
MPV: 9.8 fL (ref 7.5–12.5)
Monocytes Relative: 7.1 %
NEUTROS PCT: 82.7 %
Neutro Abs: 4962 cells/uL (ref 1500–7800)
Platelets: 184 10*3/uL (ref 140–400)
RBC: 4.11 10*6/uL — AB (ref 4.20–5.80)
RDW: 14.7 % (ref 11.0–15.0)
TOTAL LYMPHOCYTE: 9.1 %
WBC: 6 10*3/uL (ref 3.8–10.8)
WBCMIX: 426 {cells}/uL (ref 200–950)

## 2018-09-20 LAB — HEPATIC FUNCTION PANEL
AG Ratio: 1.3 (calc) (ref 1.0–2.5)
ALBUMIN MSPROF: 4.1 g/dL (ref 3.6–5.1)
ALT: 25 U/L (ref 9–46)
AST: 33 U/L (ref 10–35)
Alkaline phosphatase (APISO): 127 U/L — ABNORMAL HIGH (ref 40–115)
BILIRUBIN DIRECT: 0.6 mg/dL — AB (ref 0.0–0.2)
GLOBULIN: 3.2 g/dL (ref 1.9–3.7)
Indirect Bilirubin: 1.2 mg/dL (calc) (ref 0.2–1.2)
Total Bilirubin: 1.8 mg/dL — ABNORMAL HIGH (ref 0.2–1.2)
Total Protein: 7.3 g/dL (ref 6.1–8.1)

## 2018-09-20 NOTE — Patient Instructions (Signed)
CBC and hepatic function.

## 2018-09-20 NOTE — Progress Notes (Addendum)
Subjective:    Patient ID: Shaun May, male    DOB: 1951/07/21, 67 y.o.   MRN: [id number]  HPI Here today with c/o rectal bleeding. Rectal bleeding occasionally. Monday night he had a small amt. Yesterday, he said the rectal bleeding dripped in the commode. He states he had been taking Ibuprofen (pinch off the pill) daily. Stopped the Ibuprofen 2 days ago. Appetite is okay. Eats once a day. Does snack during the day. BMs are brown. Has been off Xarelto since 2017.  Continues to drink etoh. Smokes 2 packs cigarettes a day.   Hx of COPD ()respiratory failure), atrial fib, GERD, CHF. Maintained on 02   10/13/2016 EGD: follow up gastric ulcer.      The second portion of the duodenum was normal. Impression:               - Normal proximal esophagus and mid esophagus.                           - Salmon-colored mucosa consistent with                            short-segment Barrett's esophagus. Biopsied.                           - Z-line irregular, 44 cm from the incisors.                           - A few gastric polyps.                           - Chronic gastritis.                           - Scar in the gastric antrum.                           - A single recently bleeding angiodysplastic lesion                            in the duodenum. Treated with argon plasma                            coagulation (APC).                           - Normal second portion of the duodenum.   Hx of melena. EGD in June of 2017  Impression:               - Normal upper third of esophagus and middle third                            of esophagus.                           - Salmon-colored mucosa consistent with                            short-segment Barrett's esophagus. Biopsy  is                            contraindicated.                           - Non-bleeding gastric ulcers with no stigmata of                            bleeding.                           - Normal duodenal bulb and second  portion of the                            duodenum.                           - No specimens collected.  Hx of hypertension, CHF, etoh dependence, atrial fib, COPD,     Review of Systems Past Medical History:  Diagnosis Date  . Alcohol use   . Anemia    when on Xarelto  . Cancer ([place])   . Chronic combined systolic and diastolic CHF (congestive heart failure) ([place])   . CKD (chronic kidney disease), stage II   . COPD (chronic obstructive pulmonary disease) ([facility])    stage 4 COPD  . Dyspnea   . Gastric ulcer   . GI bleeding   . Hematuria   . Hemochromatosis   . Hypertension   . Lung nodule   . Mobitz type 2 second degree heart block    PPM MDT 10/28/15 Dr. Lovena Le  . NICM (nonischemic cardiomyopathy) ([place])   . Paroxysmal atrial flutter ([place])   . Pericardial effusion   . Persistent atrial fibrillation   . Presence of permanent cardiac pacemaker    [employer]tronic  . Tobacco abuse     Past Surgical History:  Procedure Laterality Date  . BIOPSY  10/13/2016   Procedure: BIOPSY;  Surgeon: Rogene Houston, MD;  [facility];  Service: Endoscopy;;  esophageal  . CARDIAC CATHETERIZATION N/A 03/31/2016   Procedure: Left Heart Cath and Coronary Angiography;  Surgeon: Wellington Hampshire, MD;  Location: [place] [facility];  Service: Cardiovascular;  Laterality: N/A;  . COLONOSCOPY N/A 10/13/2016   Procedure: COLONOSCOPY;  Surgeon: Rogene Houston, MD;  [facility];  Service: Endoscopy;  Laterality: N/A;  . EP IMPLANTABLE DEVICE N/A 10/28/2015   Procedure: Pacemaker Implant;  Surgeon: Evans Lance, MD;  Location: [place] [facility];  Service: Cardiovascular;  Laterality: N/A;  . EP IMPLANTABLE DEVICE N/A 11/07/2015   Procedure: Pocket Revision;  Surgeon: Evans Lance, MD;  Location: [place] [facility];  Service: Cardiovascular;  Laterality: N/A;  . ESOPHAGOGASTRODUODENOSCOPY N/A 05/27/2016   Procedure: ESOPHAGOGASTRODUODENOSCOPY (EGD);  Surgeon: Rogene Houston, MD;   [facility];  Service: Endoscopy;  Laterality: N/A;  . ESOPHAGOGASTRODUODENOSCOPY N/A 10/13/2016   Procedure: ESOPHAGOGASTRODUODENOSCOPY (EGD);  Surgeon: Rogene Houston, MD;  [facility];  Service: Endoscopy;  Laterality: N/A;  1:30 - moved to 11/8 @ 1:00 - Ann notified pt  . FUDUCIAL PLACEMENT Right 12/29/2017   Procedure: PLACEMENT OF FUDUCIAL;  Surgeon: [name], MD;  Location: [place];  Service: Thoracic;  Laterality:  Right;  Marland Kitchen POLYPECTOMY  10/13/2016   Procedure: POLYPECTOMY;  Surgeon: Rogene Houston, MD;  [facility];  Service: Endoscopy;;  transverse colon polypectomy  . TONSILLECTOMY    . VIDEO BRONCHOSCOPY WITH ENDOBRONCHIAL NAVIGATION Right 12/29/2017   Procedure: VIDEO BRONCHOSCOPY WITH ENDOBRONCHIAL NAVIGATION;  Surgeon: [name], MD;  Location: [place];  Service: Thoracic;  Laterality: Right;    Allergies  Allergen Reactions  . Xarelto [Rivaroxaban] Other (See Comments)    Caused bleeding.   . Eliquis [Apixaban]     Internal bleeding    Current Outpatient Medications on File Prior to Visit  Medication Sig Dispense Refill  . aspirin EC 81 MG tablet Take 81 mg by mouth daily.    Marland Kitchen atorvastatin (LIPITOR) 40 MG tablet TAKE ONE TABLET BY MOUTH ONCE DAILY AT 6PM. 30 tablet 6  . carvedilol (COREG) 25 MG tablet Take 25 mg by mouth 2 (two) times daily with a meal.   11  . Dextromethorphan-Guaifenesin (MUCINEX DM MAXIMUM STRENGTH) 60-1200 MG TB12 Take 1 tablet by mouth daily.    . Fluticasone-Umeclidin-Vilant (TRELEGY ELLIPTA) 100-62.5-25 MCG/INH AEPB Inhale 1 puff into the lungs daily. 60 each 11  . ibuprofen (ADVIL,MOTRIN) 200 MG tablet Take 200 mg by mouth every 6 (six) hours as needed.    Marland Kitchen LORazepam (ATIVAN) 2 MG tablet Take 2 mg by mouth 2 (two) times daily.     . Magnesium 250 MG TABS Take 500 mg by mouth daily.    Marland Kitchen oxyCODONE-acetaminophen (PERCOCET/ROXICET) 5-325 MG tablet Take 1 tablet by mouth 2 (two) times daily.  *May take one tablet three times daily as needed for pain    . OXYGEN Inhale 2 L into the lungs at bedtime. **2L with sleep and exertion if needed    . pantoprazole (PROTONIX) 40 MG tablet TAKE 1 TABLET BY MOUTH DAILY BEFORE BREAKFAST. 90 tablet 3  . potassium chloride SA (K-DUR,KLOR-CON) 20 MEQ tablet Take 1 tablet (20 mEq total) by mouth daily. 90 tablet 3  . rOPINIRole (REQUIP) 0.25 MG tablet Take 0.25-0.75 mg by mouth at bedtime. Pt. Says he takes 3 tablets at bedtime.  1  . sodium chloride (OCEAN) 0.65 % SOLN nasal spray Place 1 spray into both nostrils as needed for congestion.    . torsemide (DEMADEX) 20 MG tablet Take 1 tablet (20 mg total) by mouth daily. May taken additional 20 mg for swelling 90 tablet 3  . albuterol (PROVENTIL HFA;VENTOLIN HFA) 108 (90 Base) MCG/ACT inhaler Inhale 1-2 puffs into the lungs every 6 (six) hours as needed for wheezing or shortness of breath. 1 Inhaler 0   No current facility-administered medications on file prior to visit.         Objective:   Physical Exam Blood pressure 140/90, pulse 60, temperature 98 F (36.7 C), height 5\' 10"  (1.778 m), weight 197 lb (89.4 kg). Alert and oriented. Skin warm and dry. Oral mucosa is moist.   . Sclera anicteric, conjunctivae is pink. Thyroid not enlarged. No cervical lymphadenopathy. Bilateral wheezes.  Heart regular rate and rhythm.  Abdomen is soft. Bowel sounds are positive. No hepatomegaly. No abdominal masses felt. No tenderness.  2+ edema to lower extremities.           Assessment & Plan:  Rectal bleeding. CBC and Hepatic today. Further recommendations to follow. Will get recent labs from Dr. Nevada Crane.

## 2018-09-22 ENCOUNTER — Ambulatory Visit (HOSPITAL_COMMUNITY): Payer: Medicare Other | Admitting: Physical Therapy

## 2018-09-25 ENCOUNTER — Ambulatory Visit (HOSPITAL_COMMUNITY): Payer: Medicare Other | Admitting: Physical Therapy

## 2018-09-26 ENCOUNTER — Other Ambulatory Visit (INDEPENDENT_AMBULATORY_CARE_PROVIDER_SITE_OTHER): Payer: Self-pay | Admitting: *Deleted

## 2018-09-26 DIAGNOSIS — K625 Hemorrhage of anus and rectum: Secondary | ICD-10-CM

## 2018-09-27 ENCOUNTER — Ambulatory Visit (HOSPITAL_COMMUNITY): Payer: Medicare Other | Admitting: Physical Therapy

## 2018-09-29 ENCOUNTER — Encounter

## 2018-09-29 ENCOUNTER — Ambulatory Visit (HOSPITAL_COMMUNITY): Payer: Medicare Other

## 2018-10-02 ENCOUNTER — Ambulatory Visit (HOSPITAL_COMMUNITY): Payer: Medicare Other | Admitting: Physical Therapy

## 2018-10-04 ENCOUNTER — Ambulatory Visit (HOSPITAL_COMMUNITY): Payer: Medicare Other | Admitting: Physical Therapy

## 2018-10-06 ENCOUNTER — Ambulatory Visit (HOSPITAL_COMMUNITY): Payer: Medicare Other | Admitting: Physical Therapy

## 2018-10-10 DIAGNOSIS — Z85118 Personal history of other malignant neoplasm of bronchus and lung: Secondary | ICD-10-CM | POA: Diagnosis not present

## 2018-10-10 DIAGNOSIS — J441 Chronic obstructive pulmonary disease with (acute) exacerbation: Secondary | ICD-10-CM | POA: Diagnosis not present

## 2018-10-10 DIAGNOSIS — I5032 Chronic diastolic (congestive) heart failure: Secondary | ICD-10-CM | POA: Diagnosis not present

## 2018-10-10 DIAGNOSIS — R0602 Shortness of breath: Secondary | ICD-10-CM | POA: Diagnosis not present

## 2018-10-10 DIAGNOSIS — Z23 Encounter for immunization: Secondary | ICD-10-CM | POA: Diagnosis not present

## 2018-10-10 DIAGNOSIS — G894 Chronic pain syndrome: Secondary | ICD-10-CM | POA: Diagnosis not present

## 2018-10-10 DIAGNOSIS — Z8719 Personal history of other diseases of the digestive system: Secondary | ICD-10-CM | POA: Diagnosis not present

## 2018-10-11 ENCOUNTER — Ambulatory Visit (INDEPENDENT_AMBULATORY_CARE_PROVIDER_SITE_OTHER): Payer: Medicare Other | Admitting: Cardiovascular Disease

## 2018-10-11 ENCOUNTER — Encounter: Payer: Self-pay | Admitting: Cardiovascular Disease

## 2018-10-11 VITALS — BP 94/60 | HR 94 | Ht 71.0 in | Wt 195.0 lb

## 2018-10-11 DIAGNOSIS — I2510 Atherosclerotic heart disease of native coronary artery without angina pectoris: Secondary | ICD-10-CM | POA: Diagnosis not present

## 2018-10-11 DIAGNOSIS — I441 Atrioventricular block, second degree: Secondary | ICD-10-CM | POA: Diagnosis not present

## 2018-10-11 DIAGNOSIS — I313 Pericardial effusion (noninflammatory): Secondary | ICD-10-CM

## 2018-10-11 DIAGNOSIS — I480 Paroxysmal atrial fibrillation: Secondary | ICD-10-CM | POA: Diagnosis not present

## 2018-10-11 DIAGNOSIS — J449 Chronic obstructive pulmonary disease, unspecified: Secondary | ICD-10-CM

## 2018-10-11 DIAGNOSIS — C3490 Malignant neoplasm of unspecified part of unspecified bronchus or lung: Secondary | ICD-10-CM

## 2018-10-11 DIAGNOSIS — I5032 Chronic diastolic (congestive) heart failure: Secondary | ICD-10-CM

## 2018-10-11 DIAGNOSIS — I429 Cardiomyopathy, unspecified: Secondary | ICD-10-CM

## 2018-10-11 DIAGNOSIS — Z950 Presence of cardiac pacemaker: Secondary | ICD-10-CM | POA: Diagnosis not present

## 2018-10-11 DIAGNOSIS — Z720 Tobacco use: Secondary | ICD-10-CM | POA: Diagnosis not present

## 2018-10-11 DIAGNOSIS — I3139 Other pericardial effusion (noninflammatory): Secondary | ICD-10-CM

## 2018-10-11 DIAGNOSIS — I2584 Coronary atherosclerosis due to calcified coronary lesion: Secondary | ICD-10-CM | POA: Diagnosis not present

## 2018-10-11 NOTE — Progress Notes (Signed)
SUBJECTIVE: The patient presents for routine follow-up.  He has a history of nonischemic cardiomyopathy, COPD, chronic hypoxic respiratory failure, chronic diastolic heart failure, paroxysmal atrial fibrillation, second-degree heart block with pacemaker placement, hypertension, lung cancer, and GI bleeding.  He is doing fairly well overall from a cardiac perspective.  He denies chest pain and palpitations.  He is wearing compression stockings.  He has chronic exertional dyspnea which is stable.  He denies lightheadedness and dizziness.  His wife said he does not always use the oxygen as prescribed and uses it as needed.      Review of Systems: As per "subjective", otherwise negative.  Allergies  Allergen Reactions  . Xarelto [Rivaroxaban] Other (See Comments)    Caused bleeding.   . Eliquis [Apixaban]     Internal bleeding    Current Outpatient Medications  Medication Sig Dispense Refill  . albuterol (PROVENTIL HFA;VENTOLIN HFA) 108 (90 Base) MCG/ACT inhaler Inhale 1-2 puffs into the lungs every 6 (six) hours as needed for wheezing or shortness of breath. 1 Inhaler 0  . aspirin EC 81 MG tablet Take 81 mg by mouth daily.    Marland Kitchen atorvastatin (LIPITOR) 40 MG tablet TAKE ONE TABLET BY MOUTH ONCE DAILY AT 6PM. 30 tablet 6  . carvedilol (COREG) 25 MG tablet Take 25 mg by mouth 2 (two) times daily with a meal.   11  . Dextromethorphan-Guaifenesin (MUCINEX DM MAXIMUM STRENGTH) 60-1200 MG TB12 Take 1 tablet by mouth daily.    . Fluticasone-Umeclidin-Vilant (TRELEGY ELLIPTA) 100-62.5-25 MCG/INH AEPB Inhale 1 puff into the lungs daily. 60 each 11  . ibuprofen (ADVIL,MOTRIN) 200 MG tablet Take 200 mg by mouth every 6 (six) hours as needed.    Marland Kitchen LORazepam (ATIVAN) 2 MG tablet Take 2 mg by mouth 2 (two) times daily.     . Magnesium 250 MG TABS Take 500 mg by mouth daily.    Marland Kitchen oxyCODONE-acetaminophen (PERCOCET/ROXICET) 5-325 MG tablet Take 1 tablet by mouth 2 (two) times daily. *May take one  tablet three times daily as needed for pain    . OXYGEN Inhale 2 L into the lungs at bedtime. **2L with sleep and exertion if needed    . pantoprazole (PROTONIX) 40 MG tablet TAKE 1 TABLET BY MOUTH DAILY BEFORE BREAKFAST. 90 tablet 3  . potassium chloride SA (K-DUR,KLOR-CON) 20 MEQ tablet Take 1 tablet (20 mEq total) by mouth daily. 90 tablet 3  . rOPINIRole (REQUIP) 0.25 MG tablet Take 0.25-0.75 mg by mouth at bedtime. Pt. Says he takes 3 tablets at bedtime.  1  . sodium chloride (OCEAN) 0.65 % SOLN nasal spray Place 1 spray into both nostrils as needed for congestion.    . torsemide (DEMADEX) 20 MG tablet Take 1 tablet (20 mg total) by mouth daily. May taken additional 20 mg for swelling 90 tablet 3   No current facility-administered medications for this visit.     Past Medical History:  Diagnosis Date  . Alcohol use   . Anemia    when on Xarelto  . Cancer ([place])   . Chronic combined systolic and diastolic CHF (congestive heart failure) ([place])   . CKD (chronic kidney disease), stage II   . COPD (chronic obstructive pulmonary disease) ([facility])    stage 4 COPD  . Dyspnea   . Gastric ulcer   . GI bleeding   . Hematuria   . Hemochromatosis   . Hypertension   . Lung nodule   . Mobitz type 2  second degree heart block    PPM MDT 10/28/15 Dr. Lovena Le  . NICM (nonischemic cardiomyopathy) ([place])   . Paroxysmal atrial flutter ([place])   . Pericardial effusion   . Persistent atrial fibrillation   . Presence of permanent cardiac pacemaker    [employer]  . Tobacco abuse     Past Surgical History:  Procedure Laterality Date  . BIOPSY  10/13/2016   Procedure: BIOPSY;  Surgeon: Rogene Houston, MD;  [facility];  Service: Endoscopy;;  esophageal  . CARDIAC CATHETERIZATION N/A 03/31/2016   Procedure: Left Heart Cath and Coronary Angiography;  Surgeon: Wellington Hampshire, MD;  Location: [place] [facility];  Service: Cardiovascular;  Laterality: N/A;  . COLONOSCOPY N/A 10/13/2016   Procedure:  COLONOSCOPY;  Surgeon: Rogene Houston, MD;  [facility];  Service: Endoscopy;  Laterality: N/A;  . EP IMPLANTABLE DEVICE N/A 10/28/2015   Procedure: Pacemaker Implant;  Surgeon: Evans Lance, MD;  Location: [place] [facility];  Service: Cardiovascular;  Laterality: N/A;  . EP IMPLANTABLE DEVICE N/A 11/07/2015   Procedure: Pocket Revision;  Surgeon: Evans Lance, MD;  Location: [place] [facility];  Service: Cardiovascular;  Laterality: N/A;  . ESOPHAGOGASTRODUODENOSCOPY N/A 05/27/2016   Procedure: ESOPHAGOGASTRODUODENOSCOPY (EGD);  Surgeon: Rogene Houston, MD;  [facility];  Service: Endoscopy;  Laterality: N/A;  . ESOPHAGOGASTRODUODENOSCOPY N/A 10/13/2016   Procedure: ESOPHAGOGASTRODUODENOSCOPY (EGD);  Surgeon: Rogene Houston, MD;  [facility];  Service: Endoscopy;  Laterality: N/A;  1:30 - moved to 11/8 @ 1:00 - Ann notified pt  . FUDUCIAL PLACEMENT Right 12/29/2017   Procedure: PLACEMENT OF FUDUCIAL;  Surgeon: [name], MD;  Location: [place];  Service: Thoracic;  Laterality: Right;  . POLYPECTOMY  10/13/2016   Procedure: POLYPECTOMY;  Surgeon: Rogene Houston, MD;  [facility];  Service: Endoscopy;;  transverse colon polypectomy  . TONSILLECTOMY    . VIDEO BRONCHOSCOPY WITH ENDOBRONCHIAL NAVIGATION Right 12/29/2017   Procedure: VIDEO BRONCHOSCOPY WITH ENDOBRONCHIAL NAVIGATION;  Surgeon: [name], MD;  Location: [place];  Service: Thoracic;  Laterality: Right;    Social History   Socioeconomic History  . Marital status: Divorced    Spouse name: Not on file  . Number of children: Not on file  . Years of education: Not on file  . Highest education level: Not on file  Occupational History  . Not on file  Social Needs  . Financial resource strain: Not on file  . Food insecurity:    Worry: Not on file    Inability: Not on file  . Transportation needs:    Medical: Not on file    Non-medical: Not on file  Tobacco  Use  . Smoking status: Current Every Day Smoker    Packs/day: 2.00    Years: 56.00    Pack years: 112.00    Types: E-cigarettes, Cigarettes  . Smokeless tobacco: Never Used  Substance and Sexual Activity  . Alcohol use: Yes    Alcohol/week: 3.0 standard drinks    Types: 3 Shots of liquor per week    Comment: nightly  . Drug use: Never  . Sexual activity: Yes    Birth control/protection: None  Lifestyle  . Physical activity:    Days per week: Not on file    Minutes per session: Not on file  . Stress: Not on file  Relationships  . Social connections:    Talks on phone: Not on  file    Gets together: Not on file    Attends religious service: Not on file    Active member of club or organization: Not on file    Attends meetings of clubs or organizations: Not on file    Relationship status: Not on file  . Intimate partner violence:    Fear of current or ex partner: Not on file    Emotionally abused: Not on file    Physically abused: Not on file    Forced sexual activity: Not on file  Other Topics Concern  . Not on file  Social History Narrative  . Not on file     Vitals:   10/11/18 1457  BP: 94/60  Pulse: 94  SpO2: 91%  Weight: 195 lb (88.5 kg)  Height: 5\' 11"  (1.803 m)    Wt Readings from Last 3 Encounters:  10/11/18 195 lb (88.5 kg)  09/20/18 197 lb (89.4 kg)  07/06/18 209 lb 3.2 oz (94.9 kg)     PHYSICAL EXAM General: NAD HEENT: Normal. Neck: No JVD, no thyromegaly. Lungs: Poor air movement.  No crackles or wheezes. CV: Regular rate and rhythm, normal S1/S2, no S3/S4, no murmur. No pretibial or periankle edema.    Abdomen: Soft, nontender, no distention.  Neurologic: Alert and oriented.  Psych: Normal affect. Skin: Normal. Musculoskeletal: No gross deformities.    ECG: Reviewed above under Subjective   Labs: Lab Results  Component Value Date/Time   K 4.0 06/29/2018 01:33 PM   BUN 32 (H) 06/29/2018 01:33 PM   BUN 8 02/21/2017 03:33 PM    CREATININE 1.45 (H) 06/29/2018 01:33 PM   CREATININE 1.07 04/08/2016 02:40 PM   ALT 25 09/20/2018 10:34 AM   TSH 0.755 05/25/2016 10:10 PM   HGB 12.0 (L) 09/20/2018 10:34 AM   HGB 13.8 02/21/2017 03:33 PM     Lipids: No results found for: LDLCALC, LDLDIRECT, CHOL, TRIG, HDL     ASSESSMENT AND PLAN: 1.  Nonischemic cardiomyopathy/chronic diastolic heart failure: Euvolemic on torsemide 20 mg daily.  Continue supplemental potassium.  2.  Pericardial effusion: Small to moderate in size by echocardiogram in June 2019.  3.  COPD/chronic hypoxic respiratory failure/lung cancer: Followed by pulmonology.  He has non-small cell lung cancer being followed by radiation oncology.  He has chronic dyspnea and is oxygen dependent.  4.  Paroxysmal atrial fibrillation: Currently in a regular rhythm.  Heart rate is well controlled.  Continue carvedilol 25 mg twice daily.  No longer on anticoagulation given history of GI bleeding and hematuria with anticoagulation in the past.  5.  Second-degree heart block: Status post pacemaker placement in 2016.  Normal device function.  Followed by EP.  6.  Tobacco abuse.    Disposition: Follow up 6 months   Kate Sable, M.D., F.A.C.C.

## 2018-10-11 NOTE — Patient Instructions (Signed)
Medication Instructions:  Your physician recommends that you continue on your current medications as directed. Please refer to the Current Medication list given to you today.  If you need a refill on your cardiac medications before your next appointment, please call your pharmacy.   Lab work: none If you have labs (blood work) drawn today and your tests are completely normal, you will receive your results only by: Marland Kitchen MyChart Message (if you have MyChart) OR . A paper copy in the mail If you have any lab test that is abnormal or we need to change your treatment, we will call you to review the results.  Testing/Procedures: none  Follow-Up: At [facility], you and your health needs are our priority.  As part of our continuing mission to provide you with exceptional heart care, we have created designated Provider Care Teams.  These Care Teams include your primary Cardiologist (physician) and Advanced Practice Providers (APPs -  Physician Assistants and Nurse Practitioners) who all work together to provide you with the care you need, when you need it. You will need a follow up appointment in 6 months.  Please call our office 2 months in advance to schedule this appointment.  You may see Kate Sable, MDor one of the following Advanced Practice Providers on your designated Care Team:   Bernerd Pho, PA-C [facility]) . Ermalinda Barrios, PA-C ([place])  Any Other Special Instructions Will Be Listed Below (If Applicable). None

## 2018-10-12 ENCOUNTER — Ambulatory Visit (HOSPITAL_COMMUNITY): Payer: Medicare Other

## 2018-10-13 ENCOUNTER — Ambulatory Visit: Payer: Self-pay | Admitting: Urology

## 2018-11-07 ENCOUNTER — Telehealth (HOSPITAL_COMMUNITY): Payer: Self-pay | Admitting: Internal Medicine

## 2018-11-07 NOTE — Telephone Encounter (Signed)
Patient called concerning his compression hose and was told to keep wearing the long hose and he may need to get compression briefs and that wearing the knee hi's while he is having swelling is not good. WT

## 2018-11-08 ENCOUNTER — Telehealth: Payer: Self-pay

## 2018-11-08 ENCOUNTER — Ambulatory Visit (INDEPENDENT_AMBULATORY_CARE_PROVIDER_SITE_OTHER): Payer: Medicare Other

## 2018-11-08 DIAGNOSIS — I442 Atrioventricular block, complete: Secondary | ICD-10-CM | POA: Diagnosis not present

## 2018-11-08 NOTE — Telephone Encounter (Signed)
Spoke with pt and reminded pt of remote transmission that is due today. Pt verbalized understanding.   

## 2018-11-09 NOTE — Progress Notes (Signed)
Remote pacemaker transmission.   

## 2018-11-14 ENCOUNTER — Encounter: Payer: Self-pay | Admitting: Cardiology

## 2018-11-15 ENCOUNTER — Other Ambulatory Visit: Payer: Self-pay

## 2018-11-15 NOTE — Patient Outreach (Signed)
[facility]) Care Management  11/15/2018   JOVONI BORKENHAGEN 1951/04/04 [id number]  Subjective: Successful telephone outreach to the patient for assessment.  HIPAA verified.  The patient states that he is doing fair.  He states that he has finished going to outpatient rehab for swelling in his legs.  He is now wearing compression stockings.  He feels like they affect his balance and it is hard to walk.  He said that he has spoken with the office and asked if he can get some knee hi compression socks and they said he can try it.  He said that he will get the stockings after the beginning of the year, until then he will wear the stockings. He denies any chest pain, swelling, or falls.  He states that he still has shortness of breath with exertion but if he uses his inhaler it will resolve. His weight today was 191 lbs. He states he is still monitoring his diet.  He is taking all of his medications except for aspirin.  He was having blood in his stool so he stopped the aspirin and the blood resolve. He will need to call after the beginning of the year and schedule an appointment for his cardiologist in March or February.    Current Medications:  Current Outpatient Medications  Medication Sig Dispense Refill  . albuterol (PROVENTIL HFA;VENTOLIN HFA) 108 (90 Base) MCG/ACT inhaler Inhale 1-2 puffs into the lungs every 6 (six) hours as needed for wheezing or shortness of breath. 1 Inhaler 0  . atorvastatin (LIPITOR) 40 MG tablet TAKE ONE TABLET BY MOUTH ONCE DAILY AT 6PM. 30 tablet 6  . carvedilol (COREG) 25 MG tablet Take 25 mg by mouth 2 (two) times daily with a meal.   11  . Dextromethorphan-Guaifenesin (MUCINEX DM MAXIMUM STRENGTH) 60-1200 MG TB12 Take 1 tablet by mouth daily.    . Fluticasone-Umeclidin-Vilant (TRELEGY ELLIPTA) 100-62.5-25 MCG/INH AEPB Inhale 1 puff into the lungs daily. 60 each 11  . ibuprofen (ADVIL,MOTRIN) 200 MG tablet Take 200 mg by mouth every 6 (six) hours as  needed.    Marland Kitchen LORazepam (ATIVAN) 2 MG tablet Take 2 mg by mouth 2 (two) times daily.     . Magnesium 250 MG TABS Take 500 mg by mouth daily.    Marland Kitchen oxyCODONE-acetaminophen (PERCOCET/ROXICET) 5-325 MG tablet Take 1 tablet by mouth 2 (two) times daily. *May take one tablet three times daily as needed for pain    . OXYGEN Inhale 2 L into the lungs at bedtime. **2L with sleep and exertion if needed    . pantoprazole (PROTONIX) 40 MG tablet TAKE 1 TABLET BY MOUTH DAILY BEFORE BREAKFAST. 90 tablet 3  . potassium chloride SA (K-DUR,KLOR-CON) 20 MEQ tablet Take 1 tablet (20 mEq total) by mouth daily. 90 tablet 3  . rOPINIRole (REQUIP) 0.25 MG tablet Take 0.25-0.75 mg by mouth at bedtime. Pt. Says he takes 3 tablets at bedtime.  1  . sodium chloride (OCEAN) 0.65 % SOLN nasal spray Place 1 spray into both nostrils as needed for congestion.    Marland Kitchen aspirin EC 81 MG tablet Take 81 mg by mouth daily.    Marland Kitchen torsemide (DEMADEX) 20 MG tablet Take 1 tablet (20 mg total) by mouth daily. May taken additional 20 mg for swelling 90 tablet 3   No current facility-administered medications for this visit.     Functional Status:  In your present state of health, do you have any difficulty performing the following activities:  05/27/2018 05/27/2018  Hearing? N N  Vision? N N  Difficulty concentrating or making decisions? N N  Walking or climbing stairs? Y N  Dressing or bathing? N N  Doing errands, shopping? N -  Some recent data might be hidden    Fall/Depression Screening: Fall Risk  11/15/2018 08/16/2018 06/06/2018  Falls in the past year? 0 No No   PHQ 2/9 Scores 06/06/2018 06/02/2018 05/23/2018 03/07/2018 01/06/2018  PHQ - 2 Score 2 0 0 0 0  PHQ- 9 Score 7 - - - -    Assessment: Patient will continue to benefit from health coach outreach for disease management and support.  THN CM Care Plan Problem One     Most Recent Value  THN Long Term Goal   IN 30 days the patient will verbalize that he has remained hospital  freeand called to schedulehis appointment with cardiology  [place] Term Goal Start Date  11/15/18  Interventions for Problem One Long Term Goal  Reveiwed chf signs and symptoms, action paln, medicaadherence and diet.       Plan:  RN Health Coach will contact patient in the month of January and patient agrees to next outreach.   Lazaro Arms RN, BSN, [place] Direct Dial:  [phone]  Fax: [phone]

## 2018-11-16 ENCOUNTER — Encounter: Payer: Self-pay | Admitting: Internal Medicine

## 2018-11-16 ENCOUNTER — Telehealth: Payer: Self-pay | Admitting: *Deleted

## 2018-11-16 DIAGNOSIS — D631 Anemia in chronic kidney disease: Secondary | ICD-10-CM | POA: Diagnosis not present

## 2018-11-16 DIAGNOSIS — R944 Abnormal results of kidney function studies: Secondary | ICD-10-CM | POA: Diagnosis not present

## 2018-11-16 DIAGNOSIS — I10 Essential (primary) hypertension: Secondary | ICD-10-CM | POA: Diagnosis not present

## 2018-11-16 DIAGNOSIS — Z6827 Body mass index (BMI) 27.0-27.9, adult: Secondary | ICD-10-CM | POA: Diagnosis not present

## 2018-11-16 NOTE — Telephone Encounter (Signed)
Called patient to ask about rescheduling fu due to not having test, patient stated that he is interested in seeing someone in [place], if he is interested in coming back for test and fu, he will call me and let me know.

## 2018-11-17 ENCOUNTER — Ambulatory Visit: Payer: Medicare Other | Admitting: Urology

## 2018-11-27 ENCOUNTER — Other Ambulatory Visit (INDEPENDENT_AMBULATORY_CARE_PROVIDER_SITE_OTHER): Payer: Self-pay | Admitting: *Deleted

## 2018-11-27 ENCOUNTER — Encounter (INDEPENDENT_AMBULATORY_CARE_PROVIDER_SITE_OTHER): Payer: Self-pay | Admitting: *Deleted

## 2018-11-27 DIAGNOSIS — K625 Hemorrhage of anus and rectum: Secondary | ICD-10-CM

## 2018-12-10 LAB — CUP PACEART REMOTE DEVICE CHECK
Battery Remaining Longevity: 126 mo
Battery Voltage: 2.79 V
Brady Statistic AP VP Percent: 41 %
Brady Statistic AP VS Percent: 0 %
Brady Statistic AS VP Percent: 59 %
Implantable Lead Implant Date: 20161122
Implantable Lead Implant Date: 20161122
Implantable Lead Location: 753860
Implantable Lead Model: 5076
Implantable Pulse Generator Implant Date: 20161122
Lead Channel Impedance Value: 503 Ohm
Lead Channel Impedance Value: 582 Ohm
Lead Channel Pacing Threshold Amplitude: 0.5 V
Lead Channel Pacing Threshold Amplitude: 0.5 V
Lead Channel Pacing Threshold Pulse Width: 0.4 ms
Lead Channel Pacing Threshold Pulse Width: 0.4 ms
Lead Channel Setting Pacing Amplitude: 2 V
Lead Channel Setting Pacing Amplitude: 2.5 V
MDC IDC LEAD LOCATION: 753859
MDC IDC MSMT BATTERY IMPEDANCE: 139 Ohm
MDC IDC SESS DTM: 20191204164832
MDC IDC SET LEADCHNL RV PACING PULSEWIDTH: 0.4 ms
MDC IDC SET LEADCHNL RV SENSING SENSITIVITY: 4 mV
MDC IDC STAT BRADY AS VS PERCENT: 0 %

## 2018-12-19 ENCOUNTER — Other Ambulatory Visit: Payer: Self-pay

## 2018-12-19 NOTE — Patient Outreach (Signed)
[facility]) Care Management  12/19/2018   Shaun May [birth year] [id number]  Subjective: Successful outreach to the patient for assessment.  HIPAA verified.  The patient states that he is doing the same.  He denies any chest pain but " I have a pulling in my chest every once in a while.  He continues to have shortness of breath that is the same.  He denies any swelling or falls.  He states that his weight today is 194 lbs.  He is monitoring his diet.  Talked with the patient about canned vegetables.  Pouring out the fluid that they are canned in ,wash them off then cook them.  The patient verbalized understanding.  He states that he has bought knee high compression stacking with the toes out and they work better for him.  He is able to walk better in them. He has to call and make an appointment with his cardiologist.  He saw Dr Nevada Crane before [place] and has an appointment with him on the 73 th of this month.   Current Medications:  Current Outpatient Medications  Medication Sig Dispense Refill  . albuterol (PROVENTIL HFA;VENTOLIN HFA) 108 (90 Base) MCG/ACT inhaler Inhale 1-2 puffs into the lungs every 6 (six) hours as needed for wheezing or shortness of breath. 1 Inhaler 0  . aspirin EC 81 MG tablet Take 81 mg by mouth daily.    Marland Kitchen atorvastatin (LIPITOR) 40 MG tablet TAKE ONE TABLET BY MOUTH ONCE DAILY AT 6PM. 30 tablet 6  . carvedilol (COREG) 25 MG tablet Take 25 mg by mouth 2 (two) times daily with a meal.   11  . Dextromethorphan-Guaifenesin (MUCINEX DM MAXIMUM STRENGTH) 60-1200 MG TB12 Take 1 tablet by mouth daily.    . Fluticasone-Umeclidin-Vilant (TRELEGY ELLIPTA) 100-62.5-25 MCG/INH AEPB Inhale 1 puff into the lungs daily. 60 each 11  . ibuprofen (ADVIL,MOTRIN) 200 MG tablet Take 200 mg by mouth every 6 (six) hours as needed.    Marland Kitchen LORazepam (ATIVAN) 2 MG tablet Take 2 mg by mouth 2 (two) times daily.     . Magnesium 250 MG TABS Take 500 mg by mouth daily.    Marland Kitchen  oxyCODONE-acetaminophen (PERCOCET/ROXICET) 5-325 MG tablet Take 1 tablet by mouth 2 (two) times daily. *May take one tablet three times daily as needed for pain    . OXYGEN Inhale 2 L into the lungs at bedtime. **2L with sleep and exertion if needed    . pantoprazole (PROTONIX) 40 MG tablet TAKE 1 TABLET BY MOUTH DAILY BEFORE BREAKFAST. 90 tablet 3  . potassium chloride SA (K-DUR,KLOR-CON) 20 MEQ tablet Take 1 tablet (20 mEq total) by mouth daily. 90 tablet 3  . rOPINIRole (REQUIP) 0.25 MG tablet Take 0.25-0.75 mg by mouth at bedtime. Pt. Says he takes 3 tablets at bedtime.  1  . sodium chloride (OCEAN) 0.65 % SOLN nasal spray Place 1 spray into both nostrils as needed for congestion.    . torsemide (DEMADEX) 20 MG tablet Take 1 tablet (20 mg total) by mouth daily. May taken additional 20 mg for swelling 90 tablet 3   No current facility-administered medications for this visit.     Functional Status:  In your present state of health, do you have any difficulty performing the following activities: 05/27/2018 05/27/2018  Hearing? N N  Vision? N N  Difficulty concentrating or making decisions? N N  Walking or climbing stairs? Y N  Dressing or bathing? N N  Doing errands, shopping? N -  Some recent data might be hidden    Fall/Depression Screening: Fall Risk  12/19/2018 11/15/2018 08/16/2018  Falls in the past year? 0 0 No   PHQ 2/9 Scores 06/06/2018 06/02/2018 05/23/2018 03/07/2018 01/06/2018  PHQ - 2 Score 2 0 0 0 0  PHQ- 9 Score 7 - - - -    Assessment: Patient will continue to benefit from health coach outreach for disease management and support. THN CM Care Plan Problem One     Most Recent Value  THN Long Term Goal   IN 30 days the patient will verbalize that he has remained hospital free and called to schedulehis appointment with cardiology  [place] Term Goal Start Date  12/19/18  Interventions for Problem One Long Term Goal  review diet, copd signs and symptoms, copd action plan and  medications       Plan:  [place] will contact patient in the month of February and patient agrees to next outreach.   Lazaro Arms RN, BSN, [place] Direct Dial:  [phone]  Fax: [phone]

## 2018-12-21 DIAGNOSIS — J441 Chronic obstructive pulmonary disease with (acute) exacerbation: Secondary | ICD-10-CM | POA: Diagnosis not present

## 2019-01-17 ENCOUNTER — Other Ambulatory Visit: Payer: Self-pay

## 2019-01-17 NOTE — Patient Outreach (Signed)
[facility] Care Management  01/17/2019  RAYVON DAKIN Jul 24, 1951 [id number]    1st unsuccessful attempt to contact the patient for assessment. Gentleman answered the phone and stated that the patient was out.  HIPAA compliant voicemail left with contact information.  Plan: RN Health Coach will send letter. [place] will make outreach attempt the patient within thirty days.  Lazaro Arms RN, BSN, [place] Direct Dial:  [phone]  Fax: [phone]

## 2019-01-19 ENCOUNTER — Other Ambulatory Visit: Payer: Self-pay

## 2019-01-19 NOTE — Patient Outreach (Signed)
[facility]) Care Management  01/19/2019   Shaun May 08-29-1951 [id number]  Subjective: Successful outreach to the patient.  HIPAA verified.  The patient states that he had a wisdom tooth pulled and he had to cancel an appointment due to an abscess.  He states that he was given antibiotics and he has finished  Them and doing better.  He still has to eat soft foods.  His weight today was 195 lbs.  He denies any chest pain, swelling or falls.  He states that he does still have shortness of breath with exertion.  He is using his inhalers and wearing his oxygen. He is taking his medication as prescribed and monitoring the salt in his diet.  He states that he still needs to make an appointment with his cardiologist.  I encouraged the patient to call and explained the importance of following up with his Cardiologist.  He verbalized understanding and stated he would call.   Current Medications:  Current Outpatient Medications  Medication Sig Dispense Refill  . albuterol (PROVENTIL HFA;VENTOLIN HFA) 108 (90 Base) MCG/ACT inhaler Inhale 1-2 puffs into the lungs every 6 (six) hours as needed for wheezing or shortness of breath. 1 Inhaler 0  . aspirin EC 81 MG tablet Take 81 mg by mouth daily.    Marland Kitchen atorvastatin (LIPITOR) 40 MG tablet TAKE ONE TABLET BY MOUTH ONCE DAILY AT 6PM. 30 tablet 6  . carvedilol (COREG) 25 MG tablet Take 25 mg by mouth 2 (two) times daily with a meal.   11  . Dextromethorphan-Guaifenesin (MUCINEX DM MAXIMUM STRENGTH) 60-1200 MG TB12 Take 1 tablet by mouth daily.    . Fluticasone-Umeclidin-Vilant (TRELEGY ELLIPTA) 100-62.5-25 MCG/INH AEPB Inhale 1 puff into the lungs daily. 60 each 11  . ibuprofen (ADVIL,MOTRIN) 200 MG tablet Take 200 mg by mouth every 6 (six) hours as needed.    Marland Kitchen LORazepam (ATIVAN) 2 MG tablet Take 2 mg by mouth 2 (two) times daily.     . Magnesium 250 MG TABS Take 500 mg by mouth daily.    Marland Kitchen oxyCODONE-acetaminophen (PERCOCET/ROXICET) 5-325 MG  tablet Take 1 tablet by mouth 2 (two) times daily. *May take one tablet three times daily as needed for pain    . OXYGEN Inhale 2 L into the lungs at bedtime. **2L with sleep and exertion if needed    . pantoprazole (PROTONIX) 40 MG tablet TAKE 1 TABLET BY MOUTH DAILY BEFORE BREAKFAST. 90 tablet 3  . potassium chloride SA (K-DUR,KLOR-CON) 20 MEQ tablet Take 1 tablet (20 mEq total) by mouth daily. 90 tablet 3  . rOPINIRole (REQUIP) 0.25 MG tablet Take 0.25-0.75 mg by mouth at bedtime. Pt. Says he takes 3 tablets at bedtime.  1  . sodium chloride (OCEAN) 0.65 % SOLN nasal spray Place 1 spray into both nostrils as needed for congestion.    . torsemide (DEMADEX) 20 MG tablet Take 1 tablet (20 mg total) by mouth daily. May taken additional 20 mg for swelling 90 tablet 3   No current facility-administered medications for this visit.     Functional Status:  In your present state of health, do you have any difficulty performing the following activities: 05/27/2018 05/27/2018  Hearing? N N  Vision? N N  Difficulty concentrating or making decisions? N N  Walking or climbing stairs? Y N  Dressing or bathing? N N  Doing errands, shopping? N -  Some recent data might be hidden    Fall/Depression Screening: Fall Risk  01/19/2019  12/19/2018 11/15/2018  Falls in the past year? 0 0 0   PHQ 2/9 Scores 06/06/2018 06/02/2018 05/23/2018 03/07/2018 01/06/2018  PHQ - 2 Score 2 0 0 0 0  PHQ- 9 Score 7 - - - -    Assessment: Patient will continue to benefit from health coach outreach for disease management and support.  THN CM Care Plan Problem One     Most Recent Value  THN Long Term Goal   IN 60 days the patient will verbalize that he has remained hospital free and called to schedule his appointment with cardiology  [place] Term Goal Start Date  01/19/19  Interventions for Problem One Long Term Goal  Talked with the patient about signs and symtoms of CHF.  He states that he is taking his meds and watching his  diet.  He has not made an appointment to cardiology because he had a wisdom tooth removed and dealing with an abcess.  Explaind to the patient how improtant it is to follow up with his physician.        Plan: RN Health Coach will contact patient in the month of April and patient agrees to next outreach.   Lazaro Arms RN, BSN, [place] Direct Dial:  [phone]  Fax: [phone]

## 2019-02-07 ENCOUNTER — Ambulatory Visit (INDEPENDENT_AMBULATORY_CARE_PROVIDER_SITE_OTHER): Payer: Medicare Other | Admitting: *Deleted

## 2019-02-07 DIAGNOSIS — I442 Atrioventricular block, complete: Secondary | ICD-10-CM

## 2019-02-07 DIAGNOSIS — I5022 Chronic systolic (congestive) heart failure: Secondary | ICD-10-CM

## 2019-02-08 LAB — CUP PACEART REMOTE DEVICE CHECK
Battery Impedance: 163 Ohm
Battery Remaining Longevity: 120 mo
Battery Voltage: 2.79 V
Brady Statistic AP VS Percent: 0 %
Date Time Interrogation Session: 20200304175705
Implantable Lead Implant Date: 20161122
Implantable Lead Location: 753859
Implantable Lead Model: 5076
Implantable Pulse Generator Implant Date: 20161122
Lead Channel Pacing Threshold Pulse Width: 0.4 ms
Lead Channel Pacing Threshold Pulse Width: 0.4 ms
Lead Channel Setting Pacing Amplitude: 2 V
Lead Channel Setting Pacing Amplitude: 2.5 V
Lead Channel Setting Pacing Pulse Width: 0.4 ms
MDC IDC LEAD IMPLANT DT: 20161122
MDC IDC LEAD LOCATION: 753860
MDC IDC MSMT LEADCHNL RA IMPEDANCE VALUE: 502 Ohm
MDC IDC MSMT LEADCHNL RA PACING THRESHOLD AMPLITUDE: 0.5 V
MDC IDC MSMT LEADCHNL RV IMPEDANCE VALUE: 543 Ohm
MDC IDC MSMT LEADCHNL RV PACING THRESHOLD AMPLITUDE: 0.5 V
MDC IDC SET LEADCHNL RV SENSING SENSITIVITY: 4 mV
MDC IDC STAT BRADY AP VP PERCENT: 43 %
MDC IDC STAT BRADY AS VP PERCENT: 57 %
MDC IDC STAT BRADY AS VS PERCENT: 0 %

## 2019-02-14 NOTE — Progress Notes (Signed)
Remote pacemaker transmission.   

## 2019-02-16 ENCOUNTER — Ambulatory Visit: Payer: Self-pay

## 2019-03-06 ENCOUNTER — Ambulatory Visit (INDEPENDENT_AMBULATORY_CARE_PROVIDER_SITE_OTHER): Payer: Medicare Other | Admitting: Internal Medicine

## 2019-03-22 ENCOUNTER — Other Ambulatory Visit: Payer: Self-pay

## 2019-03-22 NOTE — Patient Outreach (Signed)
[facility]) Care Management  03/22/2019   Shaun May 1951-01-02 [id number]  Subjective: Successful outreach to the patient.  HIPAA verified.  The patient states that he is doing fair .  He states that he was just waking up and has not weighed today but his weight yesterday was 202 lbs.  He denies any chest pain, swelling or shortness of breath more than usual.  He states that he feels that his stomach has gotten bigger but that is because he is not exercisng.  He states that he is taking his meds and watching his salt intake in his diet.  He states that he has all of his meds and food needed.  He does go out the home to his shop daily and wears a mask.  We discussed preventatives measures and signs and symptoms of covid-19.  The patient verbalized understanding. The patient states he has been scheduled for appointment with his PCP in June.     Current Medications:  Current Outpatient Medications  Medication Sig Dispense Refill  . albuterol (PROVENTIL HFA;VENTOLIN HFA) 108 (90 Base) MCG/ACT inhaler Inhale 1-2 puffs into the lungs every 6 (six) hours as needed for wheezing or shortness of breath. 1 Inhaler 0  . aspirin EC 81 MG tablet Take 81 mg by mouth daily.    Marland Kitchen atorvastatin (LIPITOR) 40 MG tablet TAKE ONE TABLET BY MOUTH ONCE DAILY AT 6PM. 30 tablet 6  . carvedilol (COREG) 25 MG tablet Take 25 mg by mouth 2 (two) times daily with a meal.   11  . Dextromethorphan-Guaifenesin (MUCINEX DM MAXIMUM STRENGTH) 60-1200 MG TB12 Take 1 tablet by mouth daily.    . Fluticasone-Umeclidin-Vilant (TRELEGY ELLIPTA) 100-62.5-25 MCG/INH AEPB Inhale 1 puff into the lungs daily. 60 each 11  . ibuprofen (ADVIL,MOTRIN) 200 MG tablet Take 200 mg by mouth every 6 (six) hours as needed.    Marland Kitchen LORazepam (ATIVAN) 2 MG tablet Take 2 mg by mouth 2 (two) times daily.     . Magnesium 250 MG TABS Take 500 mg by mouth daily.    Marland Kitchen oxyCODONE-acetaminophen (PERCOCET/ROXICET) 5-325 MG tablet Take 1 tablet by  mouth 2 (two) times daily. *May take one tablet three times daily as needed for pain    . OXYGEN Inhale 2 L into the lungs at bedtime. **2L with sleep and exertion if needed    . pantoprazole (PROTONIX) 40 MG tablet TAKE 1 TABLET BY MOUTH DAILY BEFORE BREAKFAST. 90 tablet 3  . potassium chloride SA (K-DUR,KLOR-CON) 20 MEQ tablet Take 1 tablet (20 mEq total) by mouth daily. 90 tablet 3  . rOPINIRole (REQUIP) 0.25 MG tablet Take 0.25-0.75 mg by mouth at bedtime. Pt. Says he takes 3 tablets at bedtime.  1  . sodium chloride (OCEAN) 0.65 % SOLN nasal spray Place 1 spray into both nostrils as needed for congestion.    . torsemide (DEMADEX) 20 MG tablet Take 1 tablet (20 mg total) by mouth daily. May taken additional 20 mg for swelling 90 tablet 3   No current facility-administered medications for this visit.     Functional Status:  In your present state of health, do you have any difficulty performing the following activities: 05/27/2018 05/27/2018  Hearing? N N  Vision? N N  Difficulty concentrating or making decisions? N N  Walking or climbing stairs? Y N  Dressing or bathing? N N  Doing errands, shopping? N -  Some recent data might be hidden    Fall/Depression Screening: Fall Risk  03/22/2019 01/19/2019 12/19/2018  Falls in the past year? 0 0 0   PHQ 2/9 Scores 06/06/2018 06/02/2018 05/23/2018 03/07/2018 01/06/2018  PHQ - 2 Score 2 0 0 0 0  PHQ- 9 Score 7 - - - -    Assessment: Patient will continue to benefit from health coach outreach for disease management and support. THN CM Care Plan Problem One     Most Recent Value  THN Long Term Goal   IN 90 days the patient will verbalize that he has remained hospital free.  THN Long Term Goal Start Date  03/22/19  Interventions for Problem One Long Term Goal  Reviewed the signs and symptoms of CHF and action plan.  Discussed medication adherence and encouraged him to take his medications, encouraged the patient to follow up with his physician and  contact them for early preventions if any problems arise.  continue monitoring his diet and discussed prevetative measures for covid-19.     Plan: RN Health Coach will contact patient in the month of  July and patient agrees to next outreach.   Lazaro Arms RN, BSN, [place] Direct Dial:  [phone]  Fax: [phone]

## 2019-03-26 ENCOUNTER — Encounter (INDEPENDENT_AMBULATORY_CARE_PROVIDER_SITE_OTHER): Payer: Self-pay

## 2019-03-27 ENCOUNTER — Ambulatory Visit (INDEPENDENT_AMBULATORY_CARE_PROVIDER_SITE_OTHER): Payer: Medicare Other | Admitting: Internal Medicine

## 2019-03-27 ENCOUNTER — Encounter (INDEPENDENT_AMBULATORY_CARE_PROVIDER_SITE_OTHER): Payer: Self-pay | Admitting: Internal Medicine

## 2019-03-27 ENCOUNTER — Other Ambulatory Visit: Payer: Self-pay

## 2019-03-27 VITALS — BP 127/75 | HR 60 | Temp 97.6°F | Resp 18 | Ht 70.0 in | Wt 204.1 lb

## 2019-03-27 DIAGNOSIS — K219 Gastro-esophageal reflux disease without esophagitis: Secondary | ICD-10-CM | POA: Diagnosis not present

## 2019-03-27 DIAGNOSIS — K2270 Barrett's esophagus without dysplasia: Secondary | ICD-10-CM

## 2019-03-27 DIAGNOSIS — R197 Diarrhea, unspecified: Secondary | ICD-10-CM | POA: Diagnosis not present

## 2019-03-27 MED ORDER — PANTOPRAZOLE SODIUM 40 MG PO TBEC: 40.0000 mg | DELAYED_RELEASE_TABLET | Freq: Every day | ORAL | 3 refills | Status: DC

## 2019-03-27 MED ORDER — LOPERAMIDE HCL 2 MG PO CAPS: 2.0000 mg | ORAL_CAPSULE | Freq: Every day | ORAL | 0 refills | Status: DC | PRN

## 2019-03-27 NOTE — Progress Notes (Signed)
Presenting complaint;  Follow-up for chronic GERD.  Database and subjective:  Patient is 68 year old Caucasian male with history of peptic ulcer disease as well as chronic GERD complicated by short segment Barrett's esophagus who is here for scheduled visit.  He was last seen 6 months ago. He states he is doing well as per his heartburn is concerned which is not very often.  He denies nausea vomiting or dysphagia.  He complains of abdominal fullness.  He says he has a difficult time breathing when he bends or stoops forward.  He has gained 7 pounds since his last visit.  Review of his weight record reveals that he weighed 180 pounds in February 2018 and 170 pounds in November 2017.  He says his baseline weight is around 190 to 195 pounds. His new complaint is 1 of diarrhea.  He says diarrhea started about a year ago but is more pronounced now.  He has anywhere from 1-3 stools per day and most of these are loose and yellow.  He denies melena or rectal bleeding.  He has mild occasional pain across lower abdomen.  He has nocturia almost daily but he rarely has nocturnal bowel movement.  He also complains of exertional dyspnea on walking less than 50 feet.  He is still smoking.  He smokes 1-1/2 pack of cigarettes per day. He states he generally eats 1 meal a day which is evening meal.  He eats multiple snacks.  He usually eats fast foods such as pizzas and hamburgers and does not eat much the way of vegetables and fruits.  He also complains of right hip and low back pain.  He had shots in the past and it helped.  He is taking pain medication for this pain.  He he states he drinks at least 2 soft drinks a day and has 3-4 drinks of [name] every night.  He is aware that this may be the reason for his weight gain.  He takes Advil 1 to 2 tablets daily.  Current Medications: Outpatient Encounter Medications as of 03/27/2019  Medication Sig  . albuterol (PROVENTIL HFA;VENTOLIN HFA) 108 (90 Base) MCG/ACT  inhaler Inhale 1-2 puffs into the lungs every 6 (six) hours as needed for wheezing or shortness of breath.  Marland Kitchen aspirin EC 81 MG tablet Take 81 mg by mouth daily.  Marland Kitchen atorvastatin (LIPITOR) 40 MG tablet TAKE ONE TABLET BY MOUTH ONCE DAILY AT 6PM.  . carvedilol (COREG) 25 MG tablet Take 25 mg by mouth 2 (two) times daily with a meal.   . Dextromethorphan-Guaifenesin (MUCINEX DM MAXIMUM STRENGTH) 60-1200 MG TB12 Take 1 tablet by mouth daily.  . Fluticasone-Umeclidin-Vilant (TRELEGY ELLIPTA) 100-62.5-25 MCG/INH AEPB Inhale 1 puff into the lungs daily.  Marland Kitchen ibuprofen (ADVIL,MOTRIN) 200 MG tablet Take 200 mg by mouth every 6 (six) hours as needed.  Marland Kitchen LORazepam (ATIVAN) 2 MG tablet Take 2 mg by mouth 2 (two) times daily.   . Magnesium 250 MG TABS Take 500 mg by mouth daily.  Marland Kitchen oxyCODONE-acetaminophen (PERCOCET/ROXICET) 5-325 MG tablet Take 1 tablet by mouth 2 (two) times daily. *May take one tablet three times daily as needed for pain  . OXYGEN Inhale 2 L into the lungs at bedtime. **2L with sleep and exertion if needed  . pantoprazole (PROTONIX) 40 MG tablet TAKE 1 TABLET BY MOUTH DAILY BEFORE BREAKFAST.  Marland Kitchen potassium chloride SA (K-DUR,KLOR-CON) 20 MEQ tablet Take 1 tablet (20 mEq total) by mouth daily.  Marland Kitchen rOPINIRole (REQUIP) 0.5 MG tablet Take  0.5 mg by mouth at bedtime. Pt. Says he takes 3 tablets at bedtime.  . sodium chloride (OCEAN) 0.65 % SOLN nasal spray Place 1 spray into both nostrils as needed for congestion.  . torsemide (DEMADEX) 20 MG tablet Take 1 tablet (20 mg total) by mouth daily. May taken additional 20 mg for swelling   No facility-administered encounter medications on file as of 03/27/2019.      Objective: Blood pressure 127/75, pulse 60, temperature 97.6 F (36.4 C), temperature source Oral, resp. rate 18, height 5\' 10"  (1.778 m), weight 204 lb 1.6 oz (92.6 kg). Patient is alert and in no acute distress. He is using facemask. Conjunctiva is pink. Sclera is  nonicteric Oropharyngeal mucosa is normal. No neck masses or thyromegaly noted. Cardiac exam with regular rhythm normal S1 and S2. No murmur or gallop noted. Auscultation of lungs reveal few scattered expiratory rhonchi at right base.  Breath sounds are diminished bilaterally. Abdomen is protuberant.  He has small umbilical hernia which is completely reducible.  Bowel sounds are normal.  On palpation is soft and nontender with organomegaly or masses. He has trace edema around ankles.   Assessment:  #1.  Chronic GERD complicated by short segment Barrett's esophagus.  Last EGD was in November 2017.  GERD symptoms are well controlled with therapy.  #2.  Diarrhea.  He does not have any alarm symptoms.  Diarrhea may be due to magnesium.  If he does not respond to Imodium OTC may ask him to cut back on magnesium.  #3.  Weight gain.  Weight gain appears to be related to excessive calorie intake and virtually no physical activity on account of his lung disease.  Clinically he does not have ascites.  Review of chest CT from June 2019 did not reveal ascites.  #4.  Cigarette smoking.  He has dyspnea with minimal exertion and he continues to smoke cigarettes daily.  I feel this is his most pressing an urgent problem to address.  He has failed to [place] in the past.  He needs to arrange follow-up visit Dr. Nevada Crane and determine next course of action.   Plan:  New prescription for pantoprazole sent to patient's pharmacy. Imodium OTC 1 to 2 mg p.o. daily. Patient advised to quit drinking carbonated drinks and also needs to cut back on his alcohol intake to no more than 2 drinks per day. Patient will call with progress report in few weeks. Office visit in 1 year.

## 2019-03-27 NOTE — Patient Instructions (Signed)
Imodium OTC 1 to 2 mg daily as needed for diarrhea. Consider other treatment options to help you to quit cigarette smoking as discussed. Consider not drinking carbonated drinks as it might help lose some weight.

## 2019-05-09 ENCOUNTER — Ambulatory Visit (INDEPENDENT_AMBULATORY_CARE_PROVIDER_SITE_OTHER): Payer: Medicare Other | Admitting: *Deleted

## 2019-05-09 DIAGNOSIS — I442 Atrioventricular block, complete: Secondary | ICD-10-CM

## 2019-05-09 LAB — CUP PACEART REMOTE DEVICE CHECK
Battery Impedance: 188 Ohm
Battery Remaining Longevity: 115 mo
Battery Voltage: 2.79 V
Brady Statistic AP VP Percent: 50 %
Brady Statistic AP VS Percent: 0 %
Brady Statistic AS VP Percent: 50 %
Brady Statistic AS VS Percent: 0 %
Date Time Interrogation Session: 20200603134344
Implantable Lead Implant Date: 20161122
Implantable Lead Implant Date: 20161122
Implantable Lead Location: 753859
Implantable Lead Location: 753860
Implantable Lead Model: 5076
Implantable Lead Model: 5076
Implantable Pulse Generator Implant Date: 20161122
Lead Channel Impedance Value: 510 Ohm
Lead Channel Impedance Value: 543 Ohm
Lead Channel Pacing Threshold Amplitude: 0.5 V
Lead Channel Pacing Threshold Amplitude: 0.625 V
Lead Channel Pacing Threshold Pulse Width: 0.4 ms
Lead Channel Pacing Threshold Pulse Width: 0.4 ms
Lead Channel Setting Pacing Amplitude: 2 V
Lead Channel Setting Pacing Amplitude: 2.5 V
Lead Channel Setting Pacing Pulse Width: 0.4 ms
Lead Channel Setting Sensing Sensitivity: 4 mV

## 2019-05-17 ENCOUNTER — Encounter: Payer: Self-pay | Admitting: Cardiology

## 2019-05-17 NOTE — Progress Notes (Signed)
Remote pacemaker transmission.   

## 2019-05-23 DIAGNOSIS — I10 Essential (primary) hypertension: Secondary | ICD-10-CM | POA: Diagnosis not present

## 2019-05-23 DIAGNOSIS — R7301 Impaired fasting glucose: Secondary | ICD-10-CM | POA: Diagnosis not present

## 2019-05-23 DIAGNOSIS — D631 Anemia in chronic kidney disease: Secondary | ICD-10-CM | POA: Diagnosis not present

## 2019-05-23 DIAGNOSIS — N189 Chronic kidney disease, unspecified: Secondary | ICD-10-CM | POA: Diagnosis not present

## 2019-05-23 DIAGNOSIS — R944 Abnormal results of kidney function studies: Secondary | ICD-10-CM | POA: Diagnosis not present

## 2019-05-28 DIAGNOSIS — I480 Paroxysmal atrial fibrillation: Secondary | ICD-10-CM | POA: Diagnosis not present

## 2019-05-28 DIAGNOSIS — R7301 Impaired fasting glucose: Secondary | ICD-10-CM | POA: Diagnosis not present

## 2019-05-28 DIAGNOSIS — F410 Panic disorder [episodic paroxysmal anxiety] without agoraphobia: Secondary | ICD-10-CM | POA: Diagnosis not present

## 2019-05-28 DIAGNOSIS — I872 Venous insufficiency (chronic) (peripheral): Secondary | ICD-10-CM | POA: Diagnosis not present

## 2019-05-28 DIAGNOSIS — I10 Essential (primary) hypertension: Secondary | ICD-10-CM | POA: Diagnosis not present

## 2019-05-28 DIAGNOSIS — I429 Cardiomyopathy, unspecified: Secondary | ICD-10-CM | POA: Diagnosis not present

## 2019-05-28 DIAGNOSIS — G2581 Restless legs syndrome: Secondary | ICD-10-CM | POA: Diagnosis not present

## 2019-05-28 DIAGNOSIS — F17218 Nicotine dependence, cigarettes, with other nicotine-induced disorders: Secondary | ICD-10-CM | POA: Diagnosis not present

## 2019-05-28 DIAGNOSIS — R944 Abnormal results of kidney function studies: Secondary | ICD-10-CM | POA: Diagnosis not present

## 2019-05-28 DIAGNOSIS — J449 Chronic obstructive pulmonary disease, unspecified: Secondary | ICD-10-CM | POA: Diagnosis not present

## 2019-05-28 DIAGNOSIS — I5032 Chronic diastolic (congestive) heart failure: Secondary | ICD-10-CM | POA: Diagnosis not present

## 2019-06-18 DIAGNOSIS — Z9981 Dependence on supplemental oxygen: Secondary | ICD-10-CM | POA: Diagnosis not present

## 2019-06-18 DIAGNOSIS — J449 Chronic obstructive pulmonary disease, unspecified: Secondary | ICD-10-CM | POA: Diagnosis not present

## 2019-06-18 DIAGNOSIS — Z6829 Body mass index (BMI) 29.0-29.9, adult: Secondary | ICD-10-CM | POA: Diagnosis not present

## 2019-06-18 DIAGNOSIS — G894 Chronic pain syndrome: Secondary | ICD-10-CM | POA: Diagnosis not present

## 2019-06-18 DIAGNOSIS — I429 Cardiomyopathy, unspecified: Secondary | ICD-10-CM | POA: Diagnosis not present

## 2019-06-18 DIAGNOSIS — F410 Panic disorder [episodic paroxysmal anxiety] without agoraphobia: Secondary | ICD-10-CM | POA: Diagnosis not present

## 2019-06-18 DIAGNOSIS — F419 Anxiety disorder, unspecified: Secondary | ICD-10-CM | POA: Diagnosis not present

## 2019-06-18 DIAGNOSIS — J060 Acute laryngopharyngitis: Secondary | ICD-10-CM | POA: Diagnosis not present

## 2019-06-18 DIAGNOSIS — R944 Abnormal results of kidney function studies: Secondary | ICD-10-CM | POA: Diagnosis not present

## 2019-06-18 DIAGNOSIS — I10 Essential (primary) hypertension: Secondary | ICD-10-CM | POA: Diagnosis not present

## 2019-06-18 DIAGNOSIS — Z85118 Personal history of other malignant neoplasm of bronchus and lung: Secondary | ICD-10-CM | POA: Diagnosis not present

## 2019-06-18 DIAGNOSIS — E871 Hypo-osmolality and hyponatremia: Secondary | ICD-10-CM | POA: Diagnosis not present

## 2019-06-18 DIAGNOSIS — H25011 Cortical age-related cataract, right eye: Secondary | ICD-10-CM | POA: Diagnosis not present

## 2019-06-18 DIAGNOSIS — Z720 Tobacco use: Secondary | ICD-10-CM | POA: Diagnosis not present

## 2019-06-18 DIAGNOSIS — L03119 Cellulitis of unspecified part of limb: Secondary | ICD-10-CM | POA: Diagnosis not present

## 2019-06-21 ENCOUNTER — Other Ambulatory Visit: Payer: Self-pay

## 2019-06-21 NOTE — Patient Outreach (Signed)
[facility]) Care Management  06/21/2019   Shaun May October 30, 1951 [id number]  Subjective: Successful outreach to the patient.  Two patient identifiers given.  The patient states that he is doing good but he is tired of the heat.  He denies any pain or falls.  No chest pain, shortness of breath, or swelling.  He has just woke up and had not weighed this morning but yesterday he weighed 202 lbs.  He states that he is taking his medications as he should.  He said that he has not had that much of an appetite lately.  He is not sure if it is due to the heat.  Advised the patient to try and get in 3 meals a day or six small meals and include a protein and avoid salt. Also, stay out of the heat a much as possible.  He verbalized understanding. He states that he needs to call and make an appointment with cardiology. Encouraged him to call and keep all of his follow up appointments.    Current Medications:  Current Outpatient Medications  Medication Sig Dispense Refill  . albuterol (PROVENTIL HFA;VENTOLIN HFA) 108 (90 Base) MCG/ACT inhaler Inhale 1-2 puffs into the lungs every 6 (six) hours as needed for wheezing or shortness of breath. 1 Inhaler 0  . atorvastatin (LIPITOR) 40 MG tablet TAKE ONE TABLET BY MOUTH ONCE DAILY AT 6PM. 30 tablet 6  . carvedilol (COREG) 25 MG tablet Take 25 mg by mouth 2 (two) times daily with a meal.   11  . Dextromethorphan-Guaifenesin (MUCINEX DM MAXIMUM STRENGTH) 60-1200 MG TB12 Take 1 tablet by mouth daily.    . Fluticasone-Umeclidin-Vilant (TRELEGY ELLIPTA) 100-62.5-25 MCG/INH AEPB Inhale 1 puff into the lungs daily. 60 each 11  . ibuprofen (ADVIL,MOTRIN) 200 MG tablet Take 200 mg by mouth every 6 (six) hours as needed.    . loperamide (IMODIUM) 2 MG capsule Take 1 capsule (2 mg total) by mouth daily as needed for diarrhea or loose stools. 30 capsule 0  . LORazepam (ATIVAN) 2 MG tablet Take 2 mg by mouth 2 (two) times daily.     . Magnesium 250 MG TABS  Take 500 mg by mouth daily.    Marland Kitchen oxyCODONE-acetaminophen (PERCOCET/ROXICET) 5-325 MG tablet Take 1 tablet by mouth 2 (two) times daily. *May take one tablet three times daily as needed for pain    . OXYGEN Inhale 2 L into the lungs at bedtime. **2L with sleep and exertion if needed    . pantoprazole (PROTONIX) 40 MG tablet Take 1 tablet (40 mg total) by mouth daily before breakfast. 90 tablet 3  . potassium chloride SA (K-DUR,KLOR-CON) 20 MEQ tablet Take 1 tablet (20 mEq total) by mouth daily. 90 tablet 3  . rOPINIRole (REQUIP) 0.5 MG tablet Take 0.5 mg by mouth at bedtime. Pt. Says he takes 3 tablets at bedtime.  1  . sodium chloride (OCEAN) 0.65 % SOLN nasal spray Place 1 spray into both nostrils as needed for congestion.    Marland Kitchen aspirin EC 81 MG tablet Take 81 mg by mouth daily.    Marland Kitchen torsemide (DEMADEX) 20 MG tablet Take 1 tablet (20 mg total) by mouth daily. May taken additional 20 mg for swelling 90 tablet 3   No current facility-administered medications for this visit.     Functional Status:  No flowsheet data found.  Fall/Depression Screening: Fall Risk  06/21/2019 03/22/2019 01/19/2019  Falls in the past year? 0 0 0   PHQ  2/9 Scores 06/06/2018 06/02/2018 05/23/2018 03/07/2018 01/06/2018  PHQ - 2 Score 2 0 0 0 0  PHQ- 9 Score 7 - - - -    Assessment: Patient will continue to benefit from health coach outreach for disease management and support. THN CM Care Plan Problem One     Most Recent Value  THN Long Term Goal   IN 90 days the patient will verbalize that he has remained hospital free.  THN Long Term Goal Start Date  06/21/19  Interventions for Problem One Long Term Goal  Discussed signs and symptoms of CHF, encouraged the patient to eat 3 eals a day or six small mealss with some protien, encouraged the ptient to stay out of the heat as much s possible, encourage medication assistance and encouraged the patient to call for cardiology appointment and keep all f/u appointments.     Plan:  RN Health Coach will contact patient in the month of October and patient agrees to next outreach. RN Health Coach will send note to the physician.  Lazaro Arms RN, BSN, [place] Direct Dial:  [phone]  Fax: [phone]

## 2019-07-02 DIAGNOSIS — H25011 Cortical age-related cataract, right eye: Secondary | ICD-10-CM | POA: Diagnosis not present

## 2019-07-02 DIAGNOSIS — J060 Acute laryngopharyngitis: Secondary | ICD-10-CM | POA: Diagnosis not present

## 2019-07-02 DIAGNOSIS — G894 Chronic pain syndrome: Secondary | ICD-10-CM | POA: Diagnosis not present

## 2019-07-02 DIAGNOSIS — Z6829 Body mass index (BMI) 29.0-29.9, adult: Secondary | ICD-10-CM | POA: Diagnosis not present

## 2019-07-02 DIAGNOSIS — Z720 Tobacco use: Secondary | ICD-10-CM | POA: Diagnosis not present

## 2019-07-02 DIAGNOSIS — F410 Panic disorder [episodic paroxysmal anxiety] without agoraphobia: Secondary | ICD-10-CM | POA: Diagnosis not present

## 2019-07-02 DIAGNOSIS — I429 Cardiomyopathy, unspecified: Secondary | ICD-10-CM | POA: Diagnosis not present

## 2019-07-02 DIAGNOSIS — L03119 Cellulitis of unspecified part of limb: Secondary | ICD-10-CM | POA: Diagnosis not present

## 2019-07-02 DIAGNOSIS — Z9981 Dependence on supplemental oxygen: Secondary | ICD-10-CM | POA: Diagnosis not present

## 2019-07-02 DIAGNOSIS — F419 Anxiety disorder, unspecified: Secondary | ICD-10-CM | POA: Diagnosis not present

## 2019-07-02 DIAGNOSIS — I10 Essential (primary) hypertension: Secondary | ICD-10-CM | POA: Diagnosis not present

## 2019-07-02 DIAGNOSIS — Z85118 Personal history of other malignant neoplasm of bronchus and lung: Secondary | ICD-10-CM | POA: Diagnosis not present

## 2019-07-20 ENCOUNTER — Ambulatory Visit (HOSPITAL_COMMUNITY)
Admission: RE | Admit: 2019-07-20 | Discharge: 2019-07-20 | Disposition: A | Discharge status: Home / Self Care | Payer: Medicare Other | Source: Ambulatory Visit | Attending: Internal Medicine | Admitting: Internal Medicine

## 2019-07-20 ENCOUNTER — Other Ambulatory Visit: Payer: Self-pay | Admitting: Internal Medicine

## 2019-07-20 ENCOUNTER — Other Ambulatory Visit (HOSPITAL_COMMUNITY): Payer: Self-pay | Admitting: Internal Medicine

## 2019-07-20 DIAGNOSIS — K7460 Unspecified cirrhosis of liver: Secondary | ICD-10-CM | POA: Diagnosis not present

## 2019-07-20 DIAGNOSIS — R17 Unspecified jaundice: Secondary | ICD-10-CM | POA: Diagnosis not present

## 2019-07-20 DIAGNOSIS — R188 Other ascites: Secondary | ICD-10-CM | POA: Diagnosis not present

## 2019-07-30 DIAGNOSIS — G894 Chronic pain syndrome: Secondary | ICD-10-CM | POA: Diagnosis not present

## 2019-07-30 DIAGNOSIS — I10 Essential (primary) hypertension: Secondary | ICD-10-CM | POA: Diagnosis not present

## 2019-07-30 DIAGNOSIS — Z9981 Dependence on supplemental oxygen: Secondary | ICD-10-CM | POA: Diagnosis not present

## 2019-07-30 DIAGNOSIS — J060 Acute laryngopharyngitis: Secondary | ICD-10-CM | POA: Diagnosis not present

## 2019-07-30 DIAGNOSIS — Z85118 Personal history of other malignant neoplasm of bronchus and lung: Secondary | ICD-10-CM | POA: Diagnosis not present

## 2019-07-30 DIAGNOSIS — Z720 Tobacco use: Secondary | ICD-10-CM | POA: Diagnosis not present

## 2019-07-30 DIAGNOSIS — Z6829 Body mass index (BMI) 29.0-29.9, adult: Secondary | ICD-10-CM | POA: Diagnosis not present

## 2019-07-30 DIAGNOSIS — I429 Cardiomyopathy, unspecified: Secondary | ICD-10-CM | POA: Diagnosis not present

## 2019-07-30 DIAGNOSIS — F410 Panic disorder [episodic paroxysmal anxiety] without agoraphobia: Secondary | ICD-10-CM | POA: Diagnosis not present

## 2019-07-30 DIAGNOSIS — L03119 Cellulitis of unspecified part of limb: Secondary | ICD-10-CM | POA: Diagnosis not present

## 2019-07-30 DIAGNOSIS — F419 Anxiety disorder, unspecified: Secondary | ICD-10-CM | POA: Diagnosis not present

## 2019-07-30 DIAGNOSIS — H25011 Cortical age-related cataract, right eye: Secondary | ICD-10-CM | POA: Diagnosis not present

## 2019-08-08 ENCOUNTER — Ambulatory Visit (INDEPENDENT_AMBULATORY_CARE_PROVIDER_SITE_OTHER): Payer: Medicare Other | Admitting: Nurse Practitioner

## 2019-08-08 ENCOUNTER — Other Ambulatory Visit: Payer: Self-pay

## 2019-08-08 ENCOUNTER — Ambulatory Visit (INDEPENDENT_AMBULATORY_CARE_PROVIDER_SITE_OTHER): Payer: Medicare Other | Admitting: *Deleted

## 2019-08-08 ENCOUNTER — Encounter (INDEPENDENT_AMBULATORY_CARE_PROVIDER_SITE_OTHER): Payer: Self-pay | Admitting: *Deleted

## 2019-08-08 ENCOUNTER — Encounter (INDEPENDENT_AMBULATORY_CARE_PROVIDER_SITE_OTHER): Payer: Self-pay | Admitting: Nurse Practitioner

## 2019-08-08 VITALS — BP 119/75 | HR 70 | Temp 97.7°F | Ht 70.0 in | Wt 215.1 lb

## 2019-08-08 DIAGNOSIS — R188 Other ascites: Secondary | ICD-10-CM

## 2019-08-08 DIAGNOSIS — I5033 Acute on chronic diastolic (congestive) heart failure: Secondary | ICD-10-CM

## 2019-08-08 DIAGNOSIS — L03116 Cellulitis of left lower limb: Secondary | ICD-10-CM | POA: Diagnosis not present

## 2019-08-08 DIAGNOSIS — K7460 Unspecified cirrhosis of liver: Secondary | ICD-10-CM | POA: Insufficient documentation

## 2019-08-08 DIAGNOSIS — I441 Atrioventricular block, second degree: Secondary | ICD-10-CM

## 2019-08-08 DIAGNOSIS — E875 Hyperkalemia: Secondary | ICD-10-CM

## 2019-08-08 NOTE — Progress Notes (Addendum)
Subjective:    Patient ID: Shaun May, male    DOB: 1951-07-26, 68 y.o.   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 He presents today for further evaluation for newly diagnosed cirrhosis with ascites, scrotal edema and lower extremity edema. He was seen by Dr.John Nevada Crane on 07/02/2019 and his exam was concerning for cirrhosis. Dr. Nevada Crane increased his Torsemide 366m from QD to bid, however, the patient has not yet increased to twice daily.   Laboratory studies done 07/02/2019: Na 133. K 6.2. BUN 16. Cr. 1.39. Albumin 4.5. T. Bili 2.0. Alk phos 138. AST 22. ALT 14. BNP 446.2. WBC 7.0. Hg 12.1. HCT 40.1. MCV 96. PLT 174.   Labs 07/31/2019: Glucose. Bun 19. Cr. 1.19. Albumin 4.0. T. Bili 2.3. Alk phos 124. AST 38. ALT 31. INR 1.3. Ammonia 99.  An abdominal sonogram 07/20/2019: gallbladder is well distended with wall thickening to 539m likely related to underlying ascites. No gallstones. Common bile duct diameter 66m60mMild nodularity of the liver noted without focal pass. Portal vein is patent. Small renal cyst.   Shaun May in a wheel chair on Oxygen 2L [place] accompanied by his wife. He is alert and oriented. Cognitively intact. He drinks 1/2 gallon of [facility] for the past 25 years. He denies having any nausea or decreased appetite. No jaundice. No pruritis. He has infrequent heartburn, no dysphagia or upper abdominal pain. He passes 1 to 2 loose stools daily. No rectal bleeding or melena. He takes Advil 1 or 2 tabs several days weekly. He is mostly concerned regarding the swelling to his scrotum which is quite uncomfortable. He is having difficulty walking due to the swelling in his legs. No family history of liver disease. He smokes cigarettes 1ppd. He eats  a high fat diet. He does not restrict sodium intake.  EGD 10/13/2016:  - Normal proximal esophagus and mid esophagus. - Salmon-colored mucosa consistent with short-segment Barrett's esophagus. Biopsied. - Z-line irregular, 44 cm from the incisors. - A few gastric polyps. - Chronic gastritis. - Scar in the gastric antrum. - A single recently bleeding angiodysplastic lesion in the duodenum. Treated with argon plasma coagulation (APC). - Normal second portion of the duodenum. Biopsies confirmed Barrett's esophagus  Colonoscopy 10/13/2016:  One 7mm77mbular adenomatous polyp in the transverse colon removed and  external Hemorrhoids  Past Medical History:  Diagnosis Date   Alcohol use    Anemia    when on Xarelto   Cancer (HCC)[place] Chronic combined systolic and diastolic CHF (congestive heart failure) (HCC)    CKD (chronic kidney disease), stage II    COPD (chronic obstructive pulmonary disease) (HCC)    stage 4 COPD   Dyspnea    Gastric ulcer    GI bleeding    Hematuria    Hemochromatosis    Hypertension    Lung nodule    Mobitz type 2 second degree heart block    PPM MDT 10/28/15 Dr. TaylLovena LeICM [facility] Paroxysmal atrial flutter (HCC)    Pericardial effusion    Persistent atrial fibrillation    Presence of permanent cardiac pacemaker    [employer]   Tobacco abuse    Past Surgical History:  Procedure Laterality Date   BIOPSY  10/13/2016  Procedure: BIOPSY;  Surgeon: Rogene Houston, MD;  [facility];  Service: Endoscopy;;  esophageal   CARDIAC CATHETERIZATION N/A 03/31/2016   Procedure: Left Heart Cath and Coronary Angiography;  Surgeon: Wellington Hampshire, MD;  Location: [place] [facility];  Service: Cardiovascular;  Laterality: N/A;   COLONOSCOPY N/A 10/13/2016   Procedure: COLONOSCOPY;  Surgeon: Rogene Houston, MD;  [facility];  Service: Endoscopy;  Laterality: N/A;   EP IMPLANTABLE DEVICE  N/A 10/28/2015   Procedure: Pacemaker Implant;  Surgeon: Evans Lance, MD;  Location: [place] [facility];  Service: Cardiovascular;  Laterality: N/A;   EP IMPLANTABLE DEVICE N/A 11/07/2015   Procedure: Pocket Revision;  Surgeon: Evans Lance, MD;  Location: [place] [facility];  Service: Cardiovascular;  Laterality: N/A;   ESOPHAGOGASTRODUODENOSCOPY N/A 05/27/2016   Procedure: ESOPHAGOGASTRODUODENOSCOPY (EGD);  Surgeon: Rogene Houston, MD;  [facility];  Service: Endoscopy;  Laterality: N/A;   ESOPHAGOGASTRODUODENOSCOPY N/A 10/13/2016   Procedure: ESOPHAGOGASTRODUODENOSCOPY (EGD);  Surgeon: Rogene Houston, MD;  [facility];  Service: Endoscopy;  Laterality: N/A;  1:30 - moved to 11/8 @ 1:00 - Ann notified pt   FUDUCIAL PLACEMENT Right 12/29/2017   Procedure: PLACEMENT OF FUDUCIAL;  Surgeon: [name], MD;  Location: [place];  Service: Thoracic;  Laterality: Right;   POLYPECTOMY  10/13/2016   Procedure: POLYPECTOMY;  Surgeon: Rogene Houston, MD;  [facility];  Service: Endoscopy;;  transverse colon polypectomy   TONSILLECTOMY     VIDEO BRONCHOSCOPY WITH ENDOBRONCHIAL NAVIGATION Right 12/29/2017   Procedure: VIDEO BRONCHOSCOPY WITH ENDOBRONCHIAL NAVIGATION;  Surgeon: [name], MD;  Location: [place];  Service: Thoracic;  Laterality: Right;   Current Outpatient Medications on File Prior to Visit  Medication Sig Dispense Refill   albuterol (PROVENTIL HFA;VENTOLIN HFA) 108 (90 Base) MCG/ACT inhaler Inhale 1-2 puffs into the lungs every 6 (six) hours as needed for wheezing or shortness of breath. 1 Inhaler 0   aspirin EC 81 MG tablet Take 81 mg by mouth daily.     atorvastatin (LIPITOR) 40 MG tablet TAKE ONE TABLET BY MOUTH ONCE DAILY AT 6PM. 30 tablet 6   carvedilol (COREG) 25 MG tablet Take 25 mg by mouth 2 (two) times daily with a meal.   11   Dextromethorphan-Guaifenesin (MUCINEX DM MAXIMUM STRENGTH) 60-1200 MG TB12 Take 1  tablet by mouth daily.     Fluticasone-Umeclidin-Vilant (TRELEGY ELLIPTA) 100-62.5-25 MCG/INH AEPB Inhale 1 puff into the lungs daily. 60 each 11   ibuprofen (ADVIL,MOTRIN) 200 MG tablet Take 200 mg by mouth every 6 (six) hours as needed.     loperamide (IMODIUM) 2 MG capsule Take 1 capsule (2 mg total) by mouth daily as needed for diarrhea or loose stools. 30 capsule 0   LORazepam (ATIVAN) 2 MG tablet Take 2 mg by mouth 2 (two) times daily.      Magnesium 250 MG TABS Take 500 mg by mouth daily.     oxyCODONE-acetaminophen (PERCOCET/ROXICET) 5-325 MG tablet Take 1 tablet by mouth 2 (two) times daily. *May take one tablet three times daily as needed for pain     OXYGEN Inhale 2 L into the lungs at bedtime. **2L with sleep and exertion if needed     pantoprazole (PROTONIX) 40 MG tablet Take 1 tablet (40 mg total) by mouth daily before breakfast. 90 tablet 3   potassium chloride SA (K-DUR,KLOR-CON) 20 MEQ tablet Take 1 tablet (20 mEq  total) by mouth daily. 90 tablet 3   rOPINIRole (REQUIP) 0.5 MG tablet Take 0.5 mg by mouth at bedtime. Pt. Says he takes 3 tablets at bedtime.  1   sodium chloride (OCEAN) 0.65 % SOLN nasal spray Place 1 spray into both nostrils as needed for congestion.     torsemide (DEMADEX) 20 MG tablet Take 1 tablet (20 mg total) by mouth daily. May taken additional 20 mg for swelling 90 tablet 3   No current facility-administered medications on file prior to visit.     Allergies  Allergen Reactions   Xarelto [Rivaroxaban] Other (See Comments)    Caused bleeding.    Eliquis [Apixaban]     Internal bleeding    Family History  Problem Relation Age of Onset   Diabetes Other    Dementia Mother    Diabetes Maternal Grandmother    Social History   Socioeconomic History   Marital status: Divorced    Spouse name: Not on file   Number of children: Not on file   Years of education: Not on file   Highest education level: Not on file  Occupational  History   Not on file  Social Needs   Financial resource strain: Not on file   Food insecurity    Worry: Not on file    Inability: Not on file   Transportation needs    Medical: Not on file    Non-medical: Not on file  Tobacco Use   Smoking status: Current Every Day Smoker    Packs/day: 2.00    Years: 56.00    Pack years: 112.00    Types: E-cigarettes, Cigarettes   Smokeless tobacco: Never Used  Substance and Sexual Activity   Alcohol use: Yes    Alcohol/week: 3.0 standard drinks    Types: 3 Shots of liquor per week    Comment: nightly   Drug use: Never   Sexual activity: Yes    Birth control/protection: None  Lifestyle   Physical activity    Days per week: Not on file    Minutes per session: Not on file   Stress: Not on file  Relationships   Social connections    Talks on phone: Not on file    Gets together: Not on file    Attends religious service: Not on file    Active member of club or organization: Not on file    Attends meetings of clubs or organizations: Not on file    Relationship status: Not on file   Intimate partner violence    Fear of current or ex partner: Not on file    Emotionally abused: Not on file    Physically abused: Not on file    Forced sexual activity: Not on file  Other Topics Concern   Not on file  Social History Narrative   Not on file   Review of Systems See HPI all other systems reviewed and are negative     Objective:   Physical Exam Blood pressure 119/75, pulse 70, temperature 97.7 F (36.5 C), height _0  (1.778 m), weight 215 lb 1.6 oz (97.6 kg).  General: 68 y.o. male presents in a wheel chair on Oxygen [place], somewhat SOB but in no acute distress Eyes: sclera nonicteric, conjunctiva pink Mouth: poor dentition Neck: supple Heart: RRR, 1/6 systolic murmur Lungs: diminished, tight with scattered expiratory wheezes Abdomen: distended, somewhat tense with ascites, nontender, hypoactive bowel sounds x 4 quads,  difficulty to assess for hepatosplenomegaly with distended abdomen.  Rectal: deferred Extremities: RLE with 3+ edema, weeping serous fluid with a healing abrasion. LLE is bright red, slightly warm to touch concerning for cellulitis Neuro: alert and oriented x 3, no asterixis, speech is clear, answers questions appropriately      Assessment & Plan:   1. 68 y.o. male recently diagnosed with cirrhosis with ascites, most likely alcoholic cirrhosis. Scrotal and LE edema. RUQ sono showed cirrhosis, distended gallbladder with wall thickening. Normal CBD. PLT 174. INR 1.3. T.bili 2.3. AST 38. ALT 31. Alk phos 124 -Abdominal exam today consistent with ascites, should be enough ascites for diagnostic paracentesis scheduled 08/09/2019. Pt to receive 50gm Albumin IV with paracentesis. Peritoneal fluid lab collection to include cell ct with diff, gram stain, culture, cytology, albumin and protein -Patient will need eventual abdominal CT to further evaluate the liver and spleen -Elevated T.bili and Alk phos, will need to see if pacemaker is compatible for MRCP (current respiratory status may also interfere with MRCP imaging) -I will consult with Dr. Laural Golden before changing diuretics to Furosemide and Spironolactone -2gm low sodium diet -No NSAIDs -Reduce alcohol intake with eventual goal of no alcohol, patient at risk for DTs  -Patient will need EGD to survey for esophageal varices  -Check Hep A, B, C serologies. If not immune to Hep A and Hep B he will need vaccinations -Check ANA, SMA, AMA, Iron and Ceruloplasmin -Patient needs ECHO if not recently done  2. GERD, Barrett's esophagus  -Repeat EGD Oct or Nov. 2020 -Continue Pantoprazole 34m once daily   3. Colon polyps -Dr. RLaural Goldento verify recall date 10/2019 vs 10/2022  4. LE edema with ? cellulitis LLE -patient to contact Dr. HNevada Cranefor left leg recheck, may need antibiotic for cellulitis   5.  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 RLaural Golden  45 minutes face to face time including record review today   ADDENDUM 08/10/2019: PARACENTESIS COMPLETED TODAY 1.7L CLOUDY SEROSANGUINEOUS PERITONEAL FLUID REMOVED. AWAIT PERITONEAL LAB RESULTS. PATIENT RECEIVED ALBUMIN 50GM IV. SPIRONOLACTONE NOT RECOMMENDED DUE TO LAST K+ 6.2. BMP ADDED TO PRIOR LAB ORDER.  ADDENDUM 08/10/2019 1630 PARACENTESIS LAB RESULTS:  No SBP with PMN 120. SAAG > 1.1 with T. Protein > 2.5 indicates ascites most likely from right heart failure and less likely from cirrhosis.  However, urine protein should be done to rule out nephrotic syndrome component. Cytology pending. Patient needs ECHO asap. I called Dr.Koneswaran's office, I left a msg for him to call me.  I was unable to reach the patient or his significant other (Shaun May by phone. I left a msg on Shaun May's mobile  voicemail advising her to take [facility] if he experiences any difficulty breathing or worsening body swelling over the Labor Day holiday weekend.

## 2019-08-08 NOTE — Patient Instructions (Addendum)
1. Complete the ordered blood tests  2. Schedule a paracentesis at [facility]   3. Contact Dr. Nevada Crane regarding your right leg with redness and swelling concerning for an infection called cellulitis   4. Reduce alcohol intake, will eventually need to stop drinking any alcohol   5. Follow up in the office in 2 weeks   6. Low Sodium diet, avoid salty foods  7. I will discuss your diuretic medication with Dr. Laural Golden

## 2019-08-09 LAB — CUP PACEART REMOTE DEVICE CHECK
Battery Impedance: 188 Ohm
Battery Remaining Longevity: 112 mo
Battery Voltage: 2.79 V
Brady Statistic AP VP Percent: 51 %
Brady Statistic AP VS Percent: 0 %
Brady Statistic AS VP Percent: 49 %
Brady Statistic AS VS Percent: 0 %
Date Time Interrogation Session: 20200902152917
Implantable Lead Implant Date: 20161122
Implantable Lead Implant Date: 20161122
Implantable Lead Location: 753859
Implantable Lead Location: 753860
Implantable Lead Model: 5076
Implantable Lead Model: 5076
Implantable Pulse Generator Implant Date: 20161122
Lead Channel Impedance Value: 461 Ohm
Lead Channel Impedance Value: 488 Ohm
Lead Channel Pacing Threshold Amplitude: 0.5 V
Lead Channel Pacing Threshold Amplitude: 0.5 V
Lead Channel Pacing Threshold Pulse Width: 0.4 ms
Lead Channel Pacing Threshold Pulse Width: 0.4 ms
Lead Channel Setting Pacing Amplitude: 2 V
Lead Channel Setting Pacing Amplitude: 2.5 V
Lead Channel Setting Pacing Pulse Width: 0.4 ms
Lead Channel Setting Sensing Sensitivity: 4 mV

## 2019-08-10 ENCOUNTER — Other Ambulatory Visit: Payer: Self-pay

## 2019-08-10 ENCOUNTER — Emergency Department (HOSPITAL_COMMUNITY): Payer: Medicare Other

## 2019-08-10 ENCOUNTER — Telehealth: Payer: Self-pay | Admitting: *Deleted

## 2019-08-10 ENCOUNTER — Encounter (HOSPITAL_COMMUNITY): Payer: Self-pay

## 2019-08-10 ENCOUNTER — Ambulatory Visit (HOSPITAL_COMMUNITY)
Admission: RE | Admit: 2019-08-10 | Discharge: 2019-08-10 | Disposition: A | Discharge status: Home / Self Care | Payer: Medicare Other | Source: Ambulatory Visit | Attending: Nurse Practitioner | Admitting: Nurse Practitioner

## 2019-08-10 ENCOUNTER — Emergency Department (HOSPITAL_COMMUNITY)
Admission: EM | Admit: 2019-08-10 | Discharge: 2019-08-11 | Disposition: A | Discharge status: Home / Self Care | Payer: Medicare Other | Attending: Emergency Medicine | Admitting: Emergency Medicine

## 2019-08-10 DIAGNOSIS — I130 Hypertensive heart and chronic kidney disease with heart failure and stage 1 through stage 4 chronic kidney disease, or unspecified chronic kidney disease: Secondary | ICD-10-CM | POA: Insufficient documentation

## 2019-08-10 DIAGNOSIS — K7460 Unspecified cirrhosis of liver: Secondary | ICD-10-CM | POA: Insufficient documentation

## 2019-08-10 DIAGNOSIS — R188 Other ascites: Secondary | ICD-10-CM | POA: Diagnosis not present

## 2019-08-10 DIAGNOSIS — J90 Pleural effusion, not elsewhere classified: Secondary | ICD-10-CM | POA: Diagnosis not present

## 2019-08-10 DIAGNOSIS — I5042 Chronic combined systolic (congestive) and diastolic (congestive) heart failure: Secondary | ICD-10-CM | POA: Insufficient documentation

## 2019-08-10 DIAGNOSIS — N182 Chronic kidney disease, stage 2 (mild): Secondary | ICD-10-CM | POA: Diagnosis not present

## 2019-08-10 DIAGNOSIS — E8770 Fluid overload, unspecified: Secondary | ICD-10-CM | POA: Insufficient documentation

## 2019-08-10 DIAGNOSIS — Z87891 Personal history of nicotine dependence: Secondary | ICD-10-CM | POA: Diagnosis not present

## 2019-08-10 DIAGNOSIS — M7989 Other specified soft tissue disorders: Secondary | ICD-10-CM | POA: Diagnosis present

## 2019-08-10 LAB — BODY FLUID CELL COUNT WITH DIFFERENTIAL
Eos, Fluid: 0 %
Lymphs, Fluid: 21 %
Monocyte-Macrophage-Serous Fluid: 35 % — ABNORMAL LOW (ref 50–90)
Neutrophil Count, Fluid: 44 % — ABNORMAL HIGH (ref 0–25)
Other Cells, Fluid: 1 %
Total Nucleated Cell Count, Fluid: 273 cu mm (ref 0–1000)

## 2019-08-10 LAB — CBC WITH DIFFERENTIAL/PLATELET
Abs Immature Granulocytes: 0.03 10*3/uL (ref 0.00–0.07)
Basophils Absolute: 0 10*3/uL (ref 0.0–0.1)
Basophils Relative: 0 %
Eosinophils Absolute: 0 10*3/uL (ref 0.0–0.5)
Eosinophils Relative: 1 %
HCT: 34.9 % — ABNORMAL LOW (ref 39.0–52.0)
Hemoglobin: 10.3 g/dL — ABNORMAL LOW (ref 13.0–17.0)
Immature Granulocytes: 1 %
Lymphocytes Relative: 6 %
Lymphs Abs: 0.4 10*3/uL — ABNORMAL LOW (ref 0.7–4.0)
MCH: 30 pg (ref 26.0–34.0)
MCHC: 29.5 g/dL — ABNORMAL LOW (ref 30.0–36.0)
MCV: 101.7 fL — ABNORMAL HIGH (ref 80.0–100.0)
Monocytes Absolute: 0.5 10*3/uL (ref 0.1–1.0)
Monocytes Relative: 8 %
Neutro Abs: 5.5 10*3/uL (ref 1.7–7.7)
Neutrophils Relative %: 84 %
Platelets: 152 10*3/uL (ref 150–400)
RBC: 3.43 MIL/uL — ABNORMAL LOW (ref 4.22–5.81)
RDW: 16 % — ABNORMAL HIGH (ref 11.5–15.5)
WBC: 6.4 10*3/uL (ref 4.0–10.5)
nRBC: 0 % (ref 0.0–0.2)

## 2019-08-10 LAB — GRAM STAIN: Gram Stain: NONE SEEN

## 2019-08-10 LAB — ALBUMIN, PLEURAL OR PERITONEAL FLUID: Albumin, Fluid: 2 g/dL

## 2019-08-10 LAB — PROTEIN, PLEURAL OR PERITONEAL FLUID: Total protein, fluid: 3.8 g/dL

## 2019-08-10 MED ORDER — ALBUMIN HUMAN 25 % IV SOLN
INTRAVENOUS | Status: AC
  Administered 2019-08-10: 50 g via INTRAVENOUS
  Filled 2019-08-10: qty 200

## 2019-08-10 MED ORDER — ALBUMIN HUMAN 25 % IV SOLN
50.0000 g | Freq: Once | INTRAVENOUS | Status: AC
  Administered 2019-08-10: 11:00:00 50 g via INTRAVENOUS

## 2019-08-10 NOTE — Telephone Encounter (Signed)
Cottie Banda, NP is requesting to speak with Dr Bronson Ing regarding this pt and his symptoms - her cell # [phone]

## 2019-08-10 NOTE — Progress Notes (Signed)
Paracentesis complete no signs of distress.  

## 2019-08-10 NOTE — ED Triage Notes (Signed)
Pt presents to ED with swelling in groin area. Pt states its been going on since Monday. Pt states he has been leaking fluid since Monday also.

## 2019-08-10 NOTE — Procedures (Addendum)
PreOperative Dx: Cirrhosis, ascites Postoperative Dx: Cirrhosis, ascites Procedure:   US guided paracentesis Radiologist:  Thornton Papas Anesthesia:  10 ml of1% lidocaine Specimen:  1.7 L of serosanguinous ascitic fluid EBL:   < 1 ml Complications: None

## 2019-08-10 NOTE — Addendum Note (Signed)
Addended by: Noralyn Pick on: 08/10/2019 11:14 AM   Modules accepted: Orders

## 2019-08-11 DIAGNOSIS — E8770 Fluid overload, unspecified: Secondary | ICD-10-CM | POA: Diagnosis not present

## 2019-08-11 DIAGNOSIS — J90 Pleural effusion, not elsewhere classified: Secondary | ICD-10-CM | POA: Diagnosis not present

## 2019-08-11 LAB — COMPREHENSIVE METABOLIC PANEL
ALT: 23 U/L (ref 0–44)
AST: 27 U/L (ref 15–41)
Albumin: 3.6 g/dL (ref 3.5–5.0)
Alkaline Phosphatase: 109 U/L (ref 38–126)
Anion gap: 8 (ref 5–15)
BUN: 27 mg/dL — ABNORMAL HIGH (ref 8–23)
CO2: 32 mmol/L (ref 22–32)
Calcium: 7.9 mg/dL — ABNORMAL LOW (ref 8.9–10.3)
Chloride: 96 mmol/L — ABNORMAL LOW (ref 98–111)
Creatinine, Ser: 1.21 mg/dL (ref 0.61–1.24)
GFR calc Af Amer: >60 mL/min (ref >60)
GFR calc non Af Amer: >60 mL/min (ref >60)
Glucose, Bld: 121 mg/dL — ABNORMAL HIGH (ref 70–99)
Potassium: 4.3 mmol/L (ref 3.5–5.1)
Sodium: 136 mmol/L (ref 135–145)
Total Bilirubin: 2 mg/dL — ABNORMAL HIGH (ref 0.3–1.2)
Total Protein: 6.8 g/dL (ref 6.5–8.1)

## 2019-08-11 LAB — TROPONIN I (HIGH SENSITIVITY)
Troponin I (High Sensitivity): 7 ng/L (ref <18)
Troponin I (High Sensitivity): 8 ng/L (ref <18)

## 2019-08-11 LAB — BRAIN NATRIURETIC PEPTIDE: B Natriuretic Peptide: 601 pg/mL — ABNORMAL HIGH (ref 0.0–100.0)

## 2019-08-11 MED ORDER — FUROSEMIDE 10 MG/ML IJ SOLN
80.0000 mg | Freq: Once | INTRAMUSCULAR | Status: AC
  Administered 2019-08-11: 80 mg via INTRAVENOUS
  Filled 2019-08-11: qty 8

## 2019-08-11 NOTE — ED Provider Notes (Signed)
Emergency Department Provider Note   I have reviewed the triage vital signs and the nursing notes.   HISTORY  Chief Complaint Groin Swelling   HPI Shaun May is a 68 y.o. male with multiple medical problems documented below who presents to the emergency department today with swelling.  Patient states that over the last couple weeks of progressively worsening swelling of his legs, scrotum and his abdomen.  He states he had 1/2 L of fluid drawn off of his peritoneal cavity earlier today.  He is here because the scrotum is still swollen and he has a rash around it also with a rash on his legs associated significant swelling there as well.  He also been short of breath, decreased energy.  Patient states that sometimes will sleep 15 or 17 hours at a time.  He is in bed more than he is up.   Recently diagnosed with cirrhosis.  Also has a history of CHF and is on torsemide for that.  Most recent echocardiogram June 2019 with 60-65% EF. Sees Dr. Bronson Ing. PCP Allyn Kenner  No other associated or modifying symptoms.    Past Medical History:  Diagnosis Date  . Alcohol use   . Anemia    when on Xarelto  . Cancer ([place])   . Chronic combined systolic and diastolic CHF (congestive heart failure) ([place])   . CKD (chronic kidney disease), stage II   . COPD (chronic obstructive pulmonary disease) (HCC)    stage 4 COPD  . Dyspnea   . Gastric ulcer   . GI bleeding   . Hematuria   . Hemochromatosis   . Hypertension   . Lung nodule   . Mobitz type 2 second degree heart block    PPM MDT 10/28/15 Dr. Lovena Le  . NICM (nonischemic cardiomyopathy) ([place])   . Paroxysmal atrial flutter ([place])   . Pericardial effusion   . Persistent atrial fibrillation   . Presence of permanent cardiac pacemaker    [employer]  . Tobacco abuse     Patient Active Problem List   Diagnosis Date Noted  . Cirrhosis of liver with ascites ([place]) 08/08/2019  . Cellulitis of leg, left 08/08/2019  . CHF exacerbation ([place])  05/29/2018  . CHF (congestive heart failure) ([place]) 05/27/2018  . Chronic respiratory failure with hypoxia (HCC)/ nocturnal hypoxemia  08/15/2017  . Symptomatic anemia 05/25/2016  . Anemia 05/25/2016  . Hypokalemia 05/25/2016  . Hyponatremia 05/25/2016  . Atrial flutter ([place]), paroxysmal   . Pericardial effusion   . Acute on chronic systolic (congestive) heart failure ([place]) 03/29/2016  . 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 Elevated troponin 03/29/2016  . Restless leg syndrome 03/29/2016  . 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 Chest pain 03/27/2016  . COPD exacerbation ([place]) 03/27/2016  . Hypertension   . Essential hypertension   . Bradycardia 10/25/2015  . Heart block AV second degree 10/25/2015  . Accelerated hypertension 10/25/2015  . Hemochromatosis 10/25/2015  . Cigarette smoker 10/25/2015  . Alcohol dependence ([place]) 10/25/2015  . Second degree AV block 10/25/2015    Past Surgical History:  Procedure Laterality Date  . BIOPSY  10/13/2016   Procedure: BIOPSY;  Surgeon: Rogene Houston, MD;  [facility];  Service: Endoscopy;;  esophageal  . CARDIAC CATHETERIZATION N/A 03/31/2016   Procedure: Left Heart Cath and Coronary Angiography;  Surgeon: Wellington Hampshire, MD;  Location: [facility];  Service:  Cardiovascular;  Laterality: N/A;  . COLONOSCOPY N/A 10/13/2016   Procedure: COLONOSCOPY;  Surgeon: Rogene Houston, MD;  [facility];  Service: Endoscopy;  Laterality: N/A;  . EP IMPLANTABLE DEVICE N/A 10/28/2015   Procedure: Pacemaker Implant;  Surgeon: Evans Lance, MD;  Location: [facility];  Service: Cardiovascular;  Laterality: N/A;  . EP IMPLANTABLE DEVICE N/A 11/07/2015   Procedure: Pocket Revision;  Surgeon: Evans Lance, MD;  Location: [facility];  Service: Cardiovascular;  Laterality: N/A;  . ESOPHAGOGASTRODUODENOSCOPY N/A 05/27/2016   Procedure: ESOPHAGOGASTRODUODENOSCOPY (EGD);   Surgeon: Rogene Houston, MD;  [facility];  Service: Endoscopy;  Laterality: N/A;  . ESOPHAGOGASTRODUODENOSCOPY N/A 10/13/2016   Procedure: ESOPHAGOGASTRODUODENOSCOPY (EGD);  Surgeon: Rogene Houston, MD;  [facility];  Service: Endoscopy;  Laterality: N/A;  1:30 - moved to 11/8 @ 1:00 - Ann notified pt  . FUDUCIAL PLACEMENT Right 12/29/2017   Procedure: PLACEMENT OF FUDUCIAL;  Surgeon: [name], MD;  Location: [facility];  Service: Thoracic;  Laterality: Right;  . POLYPECTOMY  10/13/2016   Procedure: POLYPECTOMY;  Surgeon: Rogene Houston, MD;  [facility];  Service: Endoscopy;;  transverse colon polypectomy  . TONSILLECTOMY    . VIDEO BRONCHOSCOPY WITH ENDOBRONCHIAL NAVIGATION Right 12/29/2017   Procedure: VIDEO BRONCHOSCOPY WITH ENDOBRONCHIAL NAVIGATION;  Surgeon: [name], MD;  Location: [facility];  Service: Thoracic;  Laterality: Right;    Current Outpatient Rx  . Order #: [id number] Class: Print  . Order #: [id number] Class: Historical Med  . Order #: [id number] Class: Normal  . Order #: [id number] Class: Historical Med  . Order #: [id number] Class: Historical Med  . Order #: [id number] Class: Print  . Order #: [id number] Class: Historical Med  . Order #: [id number] Class: OTC  . Order #: [id number] Class: Historical Med  . Order #: [id number] Class: Historical Med  . Order #: [id number] Class: Historical Med  . Order #: [id number] Class: Historical Med  . Order #: [id number] Class: Normal  . Order #: [id number] Class: Print  . Order #: [id number] Class: Historical Med  . Order #: [id number] Class: Historical Med  . Order #: [id number] Class: Print    Allergies Xarelto [rivaroxaban] and Eliquis [apixaban]  Family History  Problem Relation Age of Onset  . Diabetes Other   . Dementia Mother   . Diabetes Maternal Grandmother     Social History Social History   Tobacco Use  . Smoking status: Current Every Day Smoker    Packs/day: 2.00    Years: 56.00     Pack years: 112.00    Types: E-cigarettes, Cigarettes  . Smokeless tobacco: Never Used  Substance Use Topics  . Alcohol use: Yes    Alcohol/week: 3.0 standard drinks    Types: 3 Shots of liquor per week    Comment: nightly  . Drug use: Never    Review of Systems  All other systems negative except as documented in the HPI. All pertinent positives and negatives as reviewed in the HPI. ____________________________________________   PHYSICAL EXAM:  VITAL SIGNS: ED Triage Vitals  Enc Vitals Group     BP 08/10/19 2257 122/74     Pulse Rate 08/10/19 2257 (!) 52     Resp 08/10/19 2257 20     Temp 08/10/19 2257 97.9 F (36.6 C)     Temp Source 08/10/19 2257 Oral     SpO2 08/10/19 2257 97 %     Weight 08/10/19 2255 215 lb (97.5 kg)     Height 08/10/19  2255 5\' 10"  (1.778 m)     Head Circumference --      Peak Flow --      Pain Score 08/10/19 2255 10     Pain Loc --      Pain Edu? --      Excl. in [place]? --     Constitutional: Alert and oriented. Well appearing and in no acute distress. Eyes: Conjunctivae are normal. PERRL. EOMI. Head: Atraumatic. Nose: No congestion/rhinnorhea. Mouth/Throat: Mucous membranes are moist.  Oropharynx non-erythematous. Neck: No stridor.  No meningeal signs.   Cardiovascular: Normal rate, regular rhythm. Good peripheral circulation. Grossly normal heart sounds.   Respiratory: Normal respiratory effort.  No retractions. Lungs diminished with crackles. Gastrointestinal: Soft and nontender. Mild distention.  Musculoskeletal: significant BLE edema to the hip. No gross deformities of extremities. Neurologic:  Normal speech and language. No gross focal neurologic deficits are appreciated.  Skin:  Skin is warm, dry and intact. Erythematous rash to L>R LE and around scrotum and sacral area likely from edema.  ____________________________________________   LABS (all labs ordered are listed, but only abnormal results are displayed)  Labs Reviewed   CBC WITH DIFFERENTIAL/PLATELET - Abnormal; Notable for the following components:      Result Value   RBC 3.43 (*)    Hemoglobin 10.3 (*)    HCT 34.9 (*)    MCV 101.7 (*)    MCHC 29.5 (*)    RDW 16.0 (*)    Lymphs Abs 0.4 (*)    All other components within normal limits  COMPREHENSIVE METABOLIC PANEL - Abnormal; Notable for the following components:   Chloride 96 (*)    Glucose, Bld 121 (*)    BUN 27 (*)    Calcium 7.9 (*)    Total Bilirubin 2.0 (*)    All other components within normal limits  BRAIN NATRIURETIC PEPTIDE - Abnormal; Notable for the following components:   B Natriuretic Peptide 601.0 (*)    All other components within normal limits  TROPONIN I (HIGH SENSITIVITY)  TROPONIN I (HIGH SENSITIVITY)   ____________________________________________  EKG   EKG Interpretation  Date/Time:    Ventricular Rate:    PR Interval:    QRS Duration:   QT Interval:    QTC Calculation:   R Axis:     Text Interpretation:         ____________________________________________  RADIOLOGY  Dg Chest 2 View  Result Date: 08/11/2019 CLINICAL DATA:  Bilateral leg swelling EXAM: CHEST - 2 VIEW COMPARISON:  06/22/2018 FINDINGS: Left pacer remains in place, unchanged. Small bilateral pleural effusions seen on the lateral view. Heart is upper limits normal in size. No confluent opacities or effusions. IMPRESSION: Small bilateral pleural effusions. Electronically Signed   By: Rolm Baptise M.D.   On: 08/11/2019 00:19   US Paracentesis  Result Date: 08/10/2019 INDICATION: Cirrhosis, ascites EXAM: ULTRASOUND GUIDED DIAGNOSTIC AND THERAPEUTIC PARACENTESIS MEDICATIONS: None. COMPLICATIONS: None immediate. PROCEDURE: Procedure, benefits, and risks of procedure were discussed with patient. Written informed consent for procedure was obtained. Time out protocol followed. Adequate collection of ascites localized by ultrasound in RIGHT lower quadrant. Skin prepped and draped in usual sterile fashion.  Skin and soft tissues anesthetized with 10 mL of 1% lidocaine. 5 [place] Yueh catheter placed into peritoneal cavity. 1.7 L of cloudy serosanguineous fluid aspirated by vacuum bottle suction. Procedure tolerated well by patient without immediate complication. FINDINGS: A total of approximately 1.7 L of ascitic fluid was removed. Samples were sent to the  laboratory as requested by the clinical team. IMPRESSION: Successful ultrasound-guided paracentesis yielding 1.7 liters of peritoneal fluid. Electronically Signed   By: Lavonia Dana M.D.   On: 08/10/2019 11:13    ____________________________________________   PROCEDURES  Procedure(s) performed:   Procedures   ____________________________________________   INITIAL IMPRESSION / ASSESSMENT AND PLAN / ED COURSE Fluid overload, near anasarca. Will eval with labs, attempt some diuresis in ED but will likely need more at home or in the hospital depending on labs.  Labs and imaging as above. No indication for admission. Had approximately 1L UOP since Lasix. Will dc to weight at home, increase torsemide. Has appt with PCP on Tuesday. Will get one with cards as well. Reasons to return d/w patient and 'lady partner' at bedside.      Pertinent labs & imaging results that were available during my care of the patient were reviewed by me and considered in my medical decision making (see chart for details).  A medical screening exam was performed and I feel the patient has had an appropriate workup for their chief complaint at this time and likelihood of emergent condition existing is low. They have been counseled on decision, discharge, follow up and which symptoms necessitate immediate return to the emergency department. They or their family verbally stated understanding and agreement with plan and discharged in stable condition.   ____________________________________________  FINAL CLINICAL IMPRESSION(S) / ED DIAGNOSES  Final diagnoses:  Hypervolemia,  unspecified hypervolemia type     MEDICATIONS GIVEN DURING THIS VISIT:  Medications  furosemide (LASIX) injection 80 mg (80 mg Intravenous Given 08/11/19 0028)     NEW OUTPATIENT MEDICATIONS STARTED DURING THIS VISIT:  Discharge Medication List as of 08/11/2019  2:51 AM      Note:  This note was prepared with assistance of Dragon voice recognition software. Occasional wrong-word or sound-a-like substitutions may have occurred due to the inherent limitations of voice recognition software.   Demari Kropp, Corene Cornea, MD 08/11/19 [phone]

## 2019-08-11 NOTE — Discharge Instructions (Addendum)
Please try to get up on your feet more often throughout the day to help alleviate the swelling.  Double your torsemide and take daily weights before your follow-up appointment with your primary care doctor and make an appointment with your cardiologist.  If you start having shortness of breath, chest pain, swelling that is not improving over time or increasing weights not responsive to diuresis please return to the emergency department.

## 2019-08-14 LAB — CBC WITH DIFFERENTIAL/PLATELET
Absolute Monocytes: 415 cells/uL (ref 200–950)
Basophils Absolute: 7 cells/uL (ref 0–200)
Basophils Relative: 0.1 %
Eosinophils Absolute: 20 cells/uL (ref 15–500)
Eosinophils Relative: 0.3 %
HCT: 34.5 % — ABNORMAL LOW (ref 38.5–50.0)
Hemoglobin: 10.8 g/dL — ABNORMAL LOW (ref 13.2–17.1)
Lymphs Abs: 306 cells/uL — ABNORMAL LOW (ref 850–3900)
MCH: 29.6 pg (ref 27.0–33.0)
MCHC: 31.3 g/dL — ABNORMAL LOW (ref 32.0–36.0)
MCV: 94.5 fL (ref 80.0–100.0)
MPV: 10.3 fL (ref 7.5–12.5)
Monocytes Relative: 6.1 %
Neutro Abs: 6052 cells/uL (ref 1500–7800)
Neutrophils Relative %: 89 %
Platelets: 153 10*3/uL (ref 140–400)
RBC: 3.65 10*6/uL — ABNORMAL LOW (ref 4.20–5.80)
RDW: 14 % (ref 11.0–15.0)
Total Lymphocyte: 4.5 %
WBC: 6.8 10*3/uL (ref 3.8–10.8)

## 2019-08-14 LAB — BASIC METABOLIC PANEL WITH GFR
BUN: 24 mg/dL (ref 7–25)
CO2: 33 mmol/L — ABNORMAL HIGH (ref 20–32)
Calcium: 8.4 mg/dL — ABNORMAL LOW (ref 8.6–10.3)
Chloride: 96 mmol/L — ABNORMAL LOW (ref 98–110)
Creat: 1.14 mg/dL (ref 0.70–1.25)
GFR, Est African American: 77 mL/min/{1.73_m2} (ref >=60)
GFR, Est Non African American: 66 mL/min/{1.73_m2} (ref >=60)
Glucose, Bld: 93 mg/dL (ref 65–139)
Potassium: 4.3 mmol/L (ref 3.5–5.3)
Sodium: 137 mmol/L (ref 135–146)

## 2019-08-14 LAB — HEPATIC FUNCTION PANEL
AG Ratio: 1.4 (calc) (ref 1.0–2.5)
ALT: 18 U/L (ref 9–46)
AST: 22 U/L (ref 10–35)
Albumin: 4 g/dL (ref 3.6–5.1)
Alkaline phosphatase (APISO): 107 U/L (ref 35–144)
Bilirubin, Direct: 1.1 mg/dL — ABNORMAL HIGH (ref 0.0–0.2)
Globulin: 2.9 g/dL (calc) (ref 1.9–3.7)
Indirect Bilirubin: 1.3 mg/dL (calc) — ABNORMAL HIGH (ref 0.2–1.2)
Total Bilirubin: 2.4 mg/dL — ABNORMAL HIGH (ref 0.2–1.2)
Total Protein: 6.9 g/dL (ref 6.1–8.1)

## 2019-08-14 LAB — CERULOPLASMIN: Ceruloplasmin: 33 mg/dL (ref 18–36)

## 2019-08-14 LAB — ALPHA-1-ANTITRYPSIN: A-1 Antitrypsin, Ser: 111 mg/dL (ref 83–199)

## 2019-08-14 LAB — HEPATITIS B SURFACE ANTIBODY,QUALITATIVE: Hep B S Ab: NONREACTIVE

## 2019-08-14 LAB — ANA: Anti Nuclear Antibody (ANA): NEGATIVE

## 2019-08-14 LAB — HEPATITIS B CORE ANTIBODY, TOTAL: Hep B Core Total Ab: NONREACTIVE

## 2019-08-14 LAB — GAMMA GT: GGT: 44 U/L (ref 3–70)

## 2019-08-14 LAB — HEPATITIS B SURFACE ANTIGEN: Hepatitis B Surface Ag: NONREACTIVE

## 2019-08-14 LAB — MITOCHONDRIAL ANTIBODIES: Mitochondrial M2 Ab, IgG: <=20 U

## 2019-08-14 LAB — HEPATITIS C ANTIBODY
Hepatitis C Ab: NONREACTIVE
SIGNAL TO CUT-OFF: 0.03 (ref <1.00)

## 2019-08-14 LAB — HEPATITIS A ANTIBODY, TOTAL: Hepatitis A AB,Total: REACTIVE — AB

## 2019-08-14 LAB — FERRITIN: Ferritin: 130 ng/mL (ref 24–380)

## 2019-08-14 LAB — ANTI-SMOOTH MUSCLE ANTIBODY, IGG: Actin (Smooth Muscle) Antibody (IGG): 20 U — ABNORMAL HIGH (ref <20)

## 2019-08-14 LAB — IRON: Iron: 103 ug/dL (ref 50–180)

## 2019-08-14 NOTE — Telephone Encounter (Signed)
I was not working this day.

## 2019-08-14 NOTE — Telephone Encounter (Signed)
She was aware that you weren't working on this day and it was after 4pm when she called

## 2019-08-15 LAB — CULTURE, BODY FLUID W GRAM STAIN -BOTTLE: Culture: NO GROWTH

## 2019-08-16 ENCOUNTER — Telehealth (INDEPENDENT_AMBULATORY_CARE_PROVIDER_SITE_OTHER): Payer: Self-pay | Admitting: Nurse Practitioner

## 2019-08-16 ENCOUNTER — Other Ambulatory Visit (INDEPENDENT_AMBULATORY_CARE_PROVIDER_SITE_OTHER): Payer: Self-pay | Admitting: Nurse Practitioner

## 2019-08-16 DIAGNOSIS — R1084 Generalized abdominal pain: Secondary | ICD-10-CM

## 2019-08-16 DIAGNOSIS — R188 Other ascites: Secondary | ICD-10-CM

## 2019-08-16 NOTE — Telephone Encounter (Signed)
I called patient, unable to reach him directly at this time. I was able to contact his significant other, Santiago Glad. I advised Santiago Glad that patient needs to be seen by his cardiologist Dr. Bronson Ing asap for cardiac follow up. Patient was at [facility] ER on 08/10/2019 with CHF, SOB and worsening edema progressing to anasarca. Paracentesis lab results indicated ascites due to CHF. I called Dr. Court Joy office, asked to speak to him regarding patient. I spoke with Colletta [place] who will facilitate schedule an appt asap. Pt will have repeat BMP then abd/pelvic CT next week.

## 2019-08-16 NOTE — Telephone Encounter (Signed)
Shaun May, please call patient, or call his significant other Shaun May, she handles his medical appointments if you cannot reach patient directly to schedule an abd/pelvic CT scan with oral and IV contrast. Patient needs to have his labs drawn 2 to 3 days before he has the CT scan with IV contrast. CT orders in Epic. I will bring you printed lab order. I spoke to  Shaun May earlier today and she is aware you would be calling her or patient with these arrangements.

## 2019-08-16 NOTE — Telephone Encounter (Signed)
CT sch'd 08/29/19 at [phone] ([phone]), left detailed message

## 2019-08-17 ENCOUNTER — Telehealth: Payer: Self-pay | Admitting: Cardiovascular Disease

## 2019-08-17 ENCOUNTER — Other Ambulatory Visit: Payer: Self-pay

## 2019-08-17 DIAGNOSIS — I5023 Acute on chronic systolic (congestive) heart failure: Secondary | ICD-10-CM

## 2019-08-17 NOTE — Telephone Encounter (Signed)

## 2019-08-20 ENCOUNTER — Other Ambulatory Visit: Payer: Self-pay | Admitting: Nurse Practitioner

## 2019-08-20 DIAGNOSIS — R1084 Generalized abdominal pain: Secondary | ICD-10-CM | POA: Diagnosis not present

## 2019-08-20 DIAGNOSIS — R188 Other ascites: Secondary | ICD-10-CM | POA: Diagnosis not present

## 2019-08-21 LAB — BASIC METABOLIC PANEL WITH GFR
BUN: 25 mg/dL (ref 7–25)
CO2: 32 mmol/L (ref 20–32)
Calcium: 7.8 mg/dL — ABNORMAL LOW (ref 8.6–10.3)
Chloride: 93 mmol/L — ABNORMAL LOW (ref 98–110)
Creat: 1.15 mg/dL (ref 0.70–1.25)
GFR, Est African American: 76 mL/min/{1.73_m2} (ref >=60)
GFR, Est Non African American: 65 mL/min/{1.73_m2} (ref >=60)
Glucose, Bld: 94 mg/dL (ref 65–139)
Potassium: 3.7 mmol/L (ref 3.5–5.3)
Sodium: 138 mmol/L (ref 135–146)

## 2019-08-23 ENCOUNTER — Ambulatory Visit (HOSPITAL_COMMUNITY): Admission: RE | Admit: 2019-08-23 | Payer: Medicare Other | Source: Ambulatory Visit

## 2019-08-24 NOTE — Progress Notes (Signed)
Remote pacemaker transmission.   

## 2019-08-29 ENCOUNTER — Ambulatory Visit (HOSPITAL_COMMUNITY)
Admission: RE | Admit: 2019-08-29 | Discharge: 2019-08-29 | Disposition: A | Discharge status: Home / Self Care | Payer: Medicare Other | Source: Ambulatory Visit | Attending: Nurse Practitioner | Admitting: Nurse Practitioner

## 2019-08-29 ENCOUNTER — Other Ambulatory Visit: Payer: Self-pay

## 2019-08-29 DIAGNOSIS — R1084 Generalized abdominal pain: Secondary | ICD-10-CM | POA: Insufficient documentation

## 2019-08-29 DIAGNOSIS — K760 Fatty (change of) liver, not elsewhere classified: Secondary | ICD-10-CM | POA: Diagnosis not present

## 2019-08-29 DIAGNOSIS — R188 Other ascites: Secondary | ICD-10-CM | POA: Diagnosis not present

## 2019-08-29 LAB — POCT I-STAT CREATININE: Creatinine, Ser: 1.2 mg/dL (ref 0.61–1.24)

## 2019-08-29 MED ORDER — IOHEXOL 300 MG/ML  SOLN
100.0000 mL | Freq: Once | INTRAMUSCULAR | Status: AC | PRN
  Administered 2019-08-29: 100 mL via INTRAVENOUS

## 2019-09-05 ENCOUNTER — Ambulatory Visit (HOSPITAL_COMMUNITY): Payer: Medicare Other

## 2019-09-05 NOTE — Progress Notes (Signed)
Virtual Visit via Video Note   This visit type was conducted due to national recommendations for restrictions regarding the COVID-19 Pandemic (e.g. social distancing) in an effort to limit this patient's exposure and mitigate transmission in our community.  Due to his co-morbid illnesses, this patient is at least at moderate risk for complications without adequate follow up.  This format is felt to be most appropriate for this patient at this time.  All issues noted in this document were discussed and addressed.  A limited physical exam was performed with this format.  Please refer to the patient's chart for his consent to telehealth for [facility].   Date:  09/06/2019   ID:  Shaun May, DOB February 19, 1951, MRN [id number]  Patient Location: Home Provider Location: Office  PCP:  Celene Squibb, MD  Cardiologist:  Kate Sable, MD  Electrophysiologist:  Dr. Lovena Le  Evaluation Performed:  Follow-Up Visit  Chief Complaint:  Dyspnea on Exertion  History of Present Illness:    Shaun May is a 68 y.o. 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   He was last examined by Dr. Bronson Ing in 10/2018 and was still having issues with exertional dyspnea and lower extremity edema but denied any chest pain or palpitations. Was continued on Torsemide 20 mg daily and informed to follow-up in 6 months but has not been evaluated since.  He was evaluated by GI on 08/08/2019 and had significant ascites by examination and was  scheduled for paracentesis which was performed on 08/10/2019 with 1.7 L of fluid removed. Was evaluated in the ED the following day for worsening swelling and shortness of breath. BNP was elevated to 601 and HS Troponin values were negative. IV Lasix was administered and he urinated over 1 L. Torsemide was titrated to 40mg  daily and outpatient follow-up was recommended.   In talking with the patient and his significant other today, he reports worsening dyspnea and lower extremity edema for the past several months. He was previously experiencing exertional dyspnea but now reports dyspnea at rest as well. He has been using 3 pillows at night but denies any specific PND. He is on supplemental oxygen 24/7. He denies any recent chest pain or palpitations. He has experienced worsening abdominal distention, scrotal edema, and lower extremity edema. Torsemide was titrated to 20 mg twice daily at the time of his ED visit but he self reduced this to 1 tablet a day due to experiencing frequent urination and worsening fatigue. He has been consuming a high sodium diet and has been having potato chips on a daily basis.  The patient does not have symptoms concerning for COVID-19 infection (fever, chills, or cough).    Past Medical History:  Diagnosis Date  . Alcohol use   . Anemia    when on Xarelto  . Cancer ([place])   . Chronic combined systolic and diastolic CHF (congestive heart failure) (HCC)    a. EF 35-40% by echo in 03/2016 with cath showing nonobstructive CAD b. EF improved to 60-65% by echo  in 05/2018)  . CKD (chronic kidney disease), stage II   . COPD (chronic obstructive pulmonary disease) (HCC)    stage 4 COPD  . Dyspnea   . Gastric ulcer   . GI bleeding   . Hematuria   . Hemochromatosis   . Hypertension   . Lung nodule   . Mobitz type 2 second degree heart block    PPM MDT 10/28/15 Dr. Lovena Le  . NICM (nonischemic cardiomyopathy) ([place])   . Paroxysmal atrial flutter ([place])   . Pericardial  effusion   . Persistent atrial fibrillation ([place])   . Presence of permanent cardiac pacemaker    [employer]  . Tobacco abuse    Past Surgical History:  Procedure Laterality Date  . BIOPSY  10/13/2016   Procedure: BIOPSY;  Surgeon: Rogene Houston, MD;  [facility];  Service: Endoscopy;;  esophageal  . CARDIAC CATHETERIZATION N/A 03/31/2016   Procedure: Left Heart Cath and Coronary Angiography;  Surgeon: Wellington Hampshire, MD;  Location: [place] CV LAB;  Service: Cardiovascular;  Laterality: N/A;  . COLONOSCOPY N/A 10/13/2016   Procedure: COLONOSCOPY;  Surgeon: Rogene Houston, MD;  [facility];  Service: Endoscopy;  Laterality: N/A;  . EP IMPLANTABLE DEVICE N/A 10/28/2015   Procedure: Pacemaker Implant;  Surgeon: Evans Lance, MD;  Location: [place] CV LAB;  Service: Cardiovascular;  Laterality: N/A;  . EP IMPLANTABLE DEVICE N/A 11/07/2015   Procedure: Pocket Revision;  Surgeon: Evans Lance, MD;  Location: [place] CV LAB;  Service: Cardiovascular;  Laterality: N/A;  . ESOPHAGOGASTRODUODENOSCOPY N/A 05/27/2016   Procedure: ESOPHAGOGASTRODUODENOSCOPY (EGD);  Surgeon: Rogene Houston, MD;  [facility];  Service: Endoscopy;  Laterality: N/A;  . ESOPHAGOGASTRODUODENOSCOPY N/A 10/13/2016   Procedure: ESOPHAGOGASTRODUODENOSCOPY (EGD);  Surgeon: Rogene Houston, MD;  [facility];  Service: Endoscopy;  Laterality: N/A;  1:30 - moved to 11/8 @ 1:00 - Ann notified pt  . FUDUCIAL PLACEMENT Right 12/29/2017   Procedure: PLACEMENT OF FUDUCIAL;  Surgeon: [name], MD;  Location: [place];  Service: Thoracic;  Laterality: Right;  . POLYPECTOMY  10/13/2016   Procedure: POLYPECTOMY;  Surgeon: Rogene Houston, MD;  [facility];  Service: Endoscopy;;  transverse colon polypectomy  . TONSILLECTOMY    . VIDEO BRONCHOSCOPY WITH ENDOBRONCHIAL NAVIGATION Right 12/29/2017   Procedure: VIDEO BRONCHOSCOPY WITH ENDOBRONCHIAL NAVIGATION;   Surgeon: [name], MD;  Location: [place];  Service: Thoracic;  Laterality: Right;     Current Meds  Medication Sig  . albuterol (PROVENTIL HFA;VENTOLIN HFA) 108 (90 Base) MCG/ACT inhaler Inhale 1-2 puffs into the lungs every 6 (six) hours as needed for wheezing or shortness of breath.  Marland Kitchen atorvastatin (LIPITOR) 20 MG tablet Take 20 mg by mouth daily at 6 PM.  . carvedilol (COREG) 25 MG tablet Take 25 mg by mouth 2 (two) times daily with a meal.   . Dextromethorphan-Guaifenesin (MUCINEX DM MAXIMUM STRENGTH) 60-1200 MG TB12 Take 1 tablet by mouth daily.  Marland Kitchen ibuprofen (ADVIL,MOTRIN) 200 MG tablet Take 200 mg by mouth every 6 (six) hours as needed.  . loperamide (IMODIUM) 2 MG capsule Take 1 capsule (2 mg total) by mouth daily as needed for diarrhea or loose stools.  Marland Kitchen LORazepam (ATIVAN) 2 MG tablet Take 2 mg by mouth 2 (two) times daily.   . Magnesium 250 MG TABS Take 500 mg by mouth daily.  Marland Kitchen oxyCODONE-acetaminophen (PERCOCET/ROXICET) 5-325 MG tablet Take 1 tablet by  mouth 2 (two) times daily. *May take one tablet three times daily as needed for pain  . OXYGEN Inhale 2 L into the lungs at bedtime. **2L with sleep and exertion if needed  . pantoprazole (PROTONIX) 40 MG tablet Take 40 mg by mouth daily.  Marland Kitchen rOPINIRole (REQUIP) 0.5 MG tablet Take 0.5 mg by mouth at bedtime. Pt. Says he takes 3 tablets at bedtime.  . sodium chloride (OCEAN) 0.65 % SOLN nasal spray Place 1 spray into both nostrils as needed for congestion.  . [DISCONTINUED] torsemide (DEMADEX) 20 MG tablet Take 1 tablet (20 mg total) by mouth daily. May taken additional 20 mg for swelling     Allergies:   Xarelto [rivaroxaban] and Eliquis [apixaban]   Social History   Tobacco Use  . Smoking status: Current Every Day Smoker    Packs/day: 0.25    Years: 56.00    Pack years: 14.00    Types: E-cigarettes, Cigarettes  . Smokeless tobacco: Never Used  Substance Use Topics  . Alcohol use: Yes    Alcohol/week: 3.0  standard drinks    Types: 3 Shots of liquor per week    Comment: nightly  . Drug use: Never     Family Hx: The patient's family history includes Dementia in his mother; Diabetes in his maternal grandmother and another family member.  ROS:   Please see the history of present illness.     All other systems reviewed and are negative.   Prior CV studies:   The following studies were reviewed today:  Echocardiogram: 05/26/2018 Study Conclusions  - Left ventricle: The cavity size was normal. There was moderate   concentric hypertrophy. Systolic function was normal. The   estimated ejection fraction was in the range of 60% to 65%. Wall   motion was normal; there were no regional wall motion   abnormalities. Features are consistent with a pseudonormal left   ventricular filling pattern, with concomitant abnormal relaxation   and increased filling pressure (grade 2 diastolic dysfunction).   Doppler parameters are consistent with high ventricular filling   pressure. - Aortic valve: Mildly calcified annulus. Trileaflet. - Mitral valve: Mildly calcified annulus. Normal thickness leaflets   . - Right ventricle: Systolic function was mildly reduced. - Pericardium, extracardiac: There was a small to moderate size   pericardial effusion without evidence for tamponade physiology.  Labs/Other Tests and Data Reviewed:    EKG:  An ECG dated 08/10/2019 was personally reviewed today and demonstrated:  rate-controlled atrial flutter, HR 60, with known RBBB.   Recent Labs: 08/10/2019: ALT 23; B Natriuretic Peptide 601.0; Hemoglobin 10.3; Platelets 152 08/20/2019: BUN 25; Potassium 3.7; Sodium 138 08/29/2019: Creatinine, Ser 1.20   Recent Lipid Panel No results found for: CHOL, TRIG, HDL, CHOLHDL, LDLCALC, LDLDIRECT  Wt Readings from Last 3 Encounters:  09/06/19 202 lb (91.6 kg)  08/10/19 215 lb (97.5 kg)  08/08/19 215 lb 1.6 oz (97.6 kg)     Objective:    Vital Signs:  BP 130/84   Pulse  64   Ht 5\' 10"  (1.778 m)   Wt 202 lb (91.6 kg)   BMI 28.98 kg/m    General: Pleasant male appearing in NAD Psych: Normal affect. Neuro: Alert and oriented X 3. Moves all extremities spontaneously.  Lungs:  Resp appear regular and unlabored. On nasal cannula.  Extremities: 2+ pitting edema along lower extremities bilaterally up to knees.    ASSESSMENT & PLAN:    1. Chronic Diastolic CHF/ History of NICM -  EF 35-40% by echo in 03/2016 with cath showing nonobstructive CAD,  improved to 60-65% by echo in 05/2018. Recent CXR on 9/5 showed small bilateral effusions and BNP was elevated to 601. - He has been experiencing worsening dyspnea on exertion along with abdominal distention, scrotal swelling, and lower extremity edema. He reports waking up too late in the day to take Torsemide 20 mg twice daily. Will try having him take 40 mg once daily.  Will obtain a repeat BMET in 2 weeks. Already scheduled for an echocardiogram to reassess LV function and wall motion. Sodium and fluid restriction were reviewed with the patient and his significant other.  2. Persistent Atrial Fibrillation/Flutter  - recent device interrogation showed persistent atrial fibrillation since 06/2019. No longer on anticoagulation given history of GIB and hematuria. He denies any recent palpitations and heart rate has been well controlled when checked at home. Continue Coreg 25 mg twice daily for rate control. Rate control strategy has been pursued as he is not a candidate for antiarrhythmics or DCCV given he is unable to tolerate anticoagulation.  3. Complete Heart Block - s/p PPM placement in 2016 which is followed by Dr. Lovena Le.  Interrogation in 08/2019 showed normal device function.  4. COPD/ Chronic Hypoxic Respiratory Failure  - on 2L [place] at baseline. He has been experiencing worsening dyspnea likely secondary to fluid accumulation.  Plan for titration of diuretic therapy as outlined above.  5. Liver Cirrhosis - s/p  paracentesis on 08/10/2019 with 1.7 L of fluid removed.  Followed by Dr. Laural Golden.     COVID-19 Education: The signs and symptoms of COVID-19 were discussed with the patient and how to seek care for testing (follow up with PCP or arrange E-visit).  The importance of social distancing was discussed today.  Time:   Today, I have spent 22 minutes with the patient with telehealth technology discussing the above problems.   Medication Adjustments/Labs and Tests Ordered: Current medicines are reviewed at length with the patient today.  Concerns regarding medicines are outlined above.   Tests Ordered: Orders Placed This Encounter  Procedures  . Basic Metabolic Panel (BMET)    Medication Changes: Meds ordered this encounter  Medications  . torsemide (DEMADEX) 20 MG tablet    Sig: Take 2 tablets (40 mg total) by mouth daily.    Dispense:  180 tablet    Refill:  3    Follow Up:  In person in 3-4 weeks.   Signed, Erma Heritage, PA-C  09/06/2019 4:34 PM    [facility]

## 2019-09-06 ENCOUNTER — Encounter: Payer: Self-pay | Admitting: Student

## 2019-09-06 ENCOUNTER — Telehealth (INDEPENDENT_AMBULATORY_CARE_PROVIDER_SITE_OTHER): Payer: Medicare Other | Admitting: Student

## 2019-09-06 ENCOUNTER — Other Ambulatory Visit: Payer: Self-pay

## 2019-09-06 VITALS — BP 130/84 | HR 64 | Ht 70.0 in | Wt 202.0 lb

## 2019-09-06 DIAGNOSIS — J449 Chronic obstructive pulmonary disease, unspecified: Secondary | ICD-10-CM

## 2019-09-06 DIAGNOSIS — I5032 Chronic diastolic (congestive) heart failure: Secondary | ICD-10-CM | POA: Diagnosis not present

## 2019-09-06 DIAGNOSIS — R600 Localized edema: Secondary | ICD-10-CM

## 2019-09-06 DIAGNOSIS — Z79899 Other long term (current) drug therapy: Secondary | ICD-10-CM

## 2019-09-06 DIAGNOSIS — Z950 Presence of cardiac pacemaker: Secondary | ICD-10-CM

## 2019-09-06 DIAGNOSIS — I4819 Other persistent atrial fibrillation: Secondary | ICD-10-CM

## 2019-09-06 DIAGNOSIS — K7460 Unspecified cirrhosis of liver: Secondary | ICD-10-CM

## 2019-09-06 MED ORDER — TORSEMIDE 20 MG PO TABS: 40.0000 mg | ORAL_TABLET | Freq: Every day | ORAL | 3 refills | Status: DC

## 2019-09-06 NOTE — Patient Instructions (Signed)
Medication Instructions:  Your physician has recommended you make the following change in your medication:  Increase Torsemide to 40 mg Daily   If you need a refill on your cardiac medications before your next appointment, please call your pharmacy.   Lab work: Your physician recommends that you return for lab work in: BMET on the day you come for your ECHO    If you have labs (blood work) drawn today and your tests are completely normal, you will receive your results only by: Marland Kitchen MyChart Message (if you have MyChart) OR . A paper copy in the mail If you have any lab test that is abnormal or we need to change your treatment, we will call you to review the results.  Testing/Procedures: NONE   Follow-Up: At [facility], you and your health needs are our priority.  As part of our continuing mission to provide you with exceptional heart care, we have created designated Provider Care Teams.  These Care Teams include your primary Cardiologist (physician) and Advanced Practice Providers (APPs -  Physician Assistants and Nurse Practitioners) who all work together to provide you with the care you need, when you need it. You will need a follow up appointment in 3-4 weeks.  Please call our office 2 months in advance to schedule this appointment.  You may see Kate Sable, MD or one of the following Advanced Practice Providers on your designated Care Team:   Bernerd Pho, PA-C [facility]) . Ermalinda Barrios, PA-C ([place])  Any Other Special Instructions Will Be Listed Below (If Applicable). Thank you for choosing [facility]!

## 2019-09-08 ENCOUNTER — Telehealth (INDEPENDENT_AMBULATORY_CARE_PROVIDER_SITE_OTHER): Payer: Self-pay | Admitting: Nurse Practitioner

## 2019-09-08 NOTE — Telephone Encounter (Signed)
Mitzie, pls call patient or his significant other Santiago Glad to schedule an office visit asap. I will review his abd/pelvic CT results with pt at time of office visit. thx

## 2019-09-11 ENCOUNTER — Ambulatory Visit (INDEPENDENT_AMBULATORY_CARE_PROVIDER_SITE_OTHER): Payer: Medicare Other | Admitting: Nurse Practitioner

## 2019-09-11 NOTE — Progress Notes (Deleted)
   Subjective:    Patient ID: Shaun May, male    DOB: Sep 15, 1951, 68 y.o.   MRN: [id number]  HPI Shaun May. Keeter is a 68 year old male with a significant past medical history of alcohol abuse, GERD, PUD, Barrett's esophagus, COPD, right lung nodule (biopsy 12/29/2017 rare atypical cells), nonischemic cardiomyopathy, diastolic CHF, atrial  fib/flutter, 2nd degree HB, s/p pacemaker 2016 and cirrhosis with ascites.  He was seen by cardiology 09/06/2019  ED the following day for worsening swelling and shortness of breath. BNP was elevated to 601 and HS Troponin values were negative. IV Lasix was administered and he urinated over 1 L. Torsemide was titrated to 40mg  daily and outpatient follow-up was recommended.  Torsemide was titrated to 20 mg twice daily at the time of his ED visit but he self reduced this to 1 tablet a day due to experiencing frequent urination and worsening fatigue. He has been consuming a high sodium diet and has been having potato chips on a daily basis.  Paracentesis  No SBP with PMN 120. SAAG > 1.1 with T. Protein > 2.5 indicates ascites most likely from right heart failure and less likely from cirrhosis.  However, urine protein should be done to rule out nephrotic syndrome component  Abdominal/pelvic CT with oral and IV contrast 08/29/2019: 1. Nodular liver with steatosis and enlarged lateral segment left liver and caudate lobe, features suggesting cirrhosis. Portal vein and superior mesenteric vein are patent. No splenomegaly. 2. 15 mm hypoattenuating lesion identified in segment IV of the liver. This is stable in size compared to the study from 5 years ago suggesting benign etiology although the lesion showed indeterminate peripheral enhancement at that time. 3. Moderate volume ascites with diffuse body wall edema. 4. Small bilateral pleural effusions 5.  Aortic Atherosclerois   ECHO Ordered    ECHO: 05/26/2018 - Left ventricle: The cavity size was normal. There  was moderate concentric hypertrophy. Systolic function was normal. The estimated ejection fraction was in the range of 60% to 65%. Wall motion was normal; there were no regional wall motion abnormalities. Features are consistent with a pseudonormal left ventricular filling pattern, with concomitant abnormal relaxation and increased filling pressure (grade 2 diastolic dysfunction). Doppler parameters are consistent with high ventricular filling pressure. - Aortic valve: Mildly calcified annulus. Trileaflet. - Mitral valve: Mildly calcified annulus. Normal thickness leaflets. - Right ventricle: Systolic function was mildly reduced. - Pericardium, extracardiac: There was a small to moderate size pericardial effusion without evidence for tamponade physiology.  Review of Systems     Objective:   Physical Exam        Assessment & Plan:

## 2019-09-18 DIAGNOSIS — R7301 Impaired fasting glucose: Secondary | ICD-10-CM | POA: Diagnosis not present

## 2019-09-18 DIAGNOSIS — D631 Anemia in chronic kidney disease: Secondary | ICD-10-CM | POA: Diagnosis not present

## 2019-09-18 DIAGNOSIS — I10 Essential (primary) hypertension: Secondary | ICD-10-CM | POA: Diagnosis not present

## 2019-09-20 ENCOUNTER — Other Ambulatory Visit: Payer: Self-pay

## 2019-09-20 DIAGNOSIS — G2581 Restless legs syndrome: Secondary | ICD-10-CM | POA: Diagnosis not present

## 2019-09-20 DIAGNOSIS — I872 Venous insufficiency (chronic) (peripheral): Secondary | ICD-10-CM | POA: Diagnosis not present

## 2019-09-20 DIAGNOSIS — F17218 Nicotine dependence, cigarettes, with other nicotine-induced disorders: Secondary | ICD-10-CM | POA: Diagnosis not present

## 2019-09-20 DIAGNOSIS — R944 Abnormal results of kidney function studies: Secondary | ICD-10-CM | POA: Diagnosis not present

## 2019-09-20 DIAGNOSIS — I429 Cardiomyopathy, unspecified: Secondary | ICD-10-CM | POA: Diagnosis not present

## 2019-09-20 DIAGNOSIS — I5032 Chronic diastolic (congestive) heart failure: Secondary | ICD-10-CM | POA: Diagnosis not present

## 2019-09-20 DIAGNOSIS — I10 Essential (primary) hypertension: Secondary | ICD-10-CM | POA: Diagnosis not present

## 2019-09-20 DIAGNOSIS — J9611 Chronic respiratory failure with hypoxia: Secondary | ICD-10-CM | POA: Diagnosis not present

## 2019-09-20 DIAGNOSIS — F410 Panic disorder [episodic paroxysmal anxiety] without agoraphobia: Secondary | ICD-10-CM | POA: Diagnosis not present

## 2019-09-20 DIAGNOSIS — I480 Paroxysmal atrial fibrillation: Secondary | ICD-10-CM | POA: Diagnosis not present

## 2019-09-20 DIAGNOSIS — R7301 Impaired fasting glucose: Secondary | ICD-10-CM | POA: Diagnosis not present

## 2019-09-20 NOTE — Patient Outreach (Signed)
[facility]) Care Management  09/20/2019  DAELEN BELVEDERE [birth year] [id number]    RN Health Coach called the patient for assessment.  The patient states that he did not have time to talk today.  He has a follow up appointment that he was on his way to.  He asked if I could call him another day.  Plan: RN Health Coach will send letter. RN Health Coach will make outreach attempt to the patient within thirty business days.  Lazaro Arms RN, BSN, [place] Direct Dial:  [phone]  Fax: [phone]

## 2019-09-21 ENCOUNTER — Ambulatory Visit: Payer: Medicare Other

## 2019-09-21 ENCOUNTER — Ambulatory Visit (HOSPITAL_COMMUNITY)
Admission: RE | Admit: 2019-09-21 | Discharge: 2019-09-21 | Disposition: A | Discharge status: Home / Self Care | Payer: Medicare Other | Source: Ambulatory Visit | Attending: Cardiovascular Disease | Admitting: Cardiovascular Disease

## 2019-09-21 ENCOUNTER — Other Ambulatory Visit (HOSPITAL_COMMUNITY)
Admission: RE | Admit: 2019-09-21 | Discharge: 2019-09-21 | Disposition: A | Discharge status: Home / Self Care | Payer: Medicare Other | Source: Ambulatory Visit | Attending: Student | Admitting: Student

## 2019-09-21 ENCOUNTER — Telehealth: Payer: Self-pay

## 2019-09-21 ENCOUNTER — Other Ambulatory Visit: Payer: Self-pay

## 2019-09-21 DIAGNOSIS — I5023 Acute on chronic systolic (congestive) heart failure: Secondary | ICD-10-CM | POA: Diagnosis not present

## 2019-09-21 LAB — BASIC METABOLIC PANEL
Anion gap: 14 (ref 5–15)
BUN: 28 mg/dL — ABNORMAL HIGH (ref 8–23)
CO2: 31 mmol/L (ref 22–32)
Calcium: 6.4 mg/dL — CL (ref 8.9–10.3)
Chloride: 82 mmol/L — ABNORMAL LOW (ref 98–111)
Creatinine, Ser: 2.33 mg/dL — ABNORMAL HIGH (ref 0.61–1.24)
GFR calc Af Amer: 32 mL/min — ABNORMAL LOW (ref >60)
GFR calc non Af Amer: 28 mL/min — ABNORMAL LOW (ref >60)
Glucose, Bld: 114 mg/dL — ABNORMAL HIGH (ref 70–99)
Potassium: 3.2 mmol/L — ABNORMAL LOW (ref 3.5–5.1)
Sodium: 127 mmol/L — ABNORMAL LOW (ref 135–145)

## 2019-09-21 NOTE — Telephone Encounter (Signed)
Called pt on both house and mobile phone. House phone rings continuously and I was able to leave message on mobile number. Called 4 times on each phone.

## 2019-09-21 NOTE — Progress Notes (Signed)
*  PRELIMINARY RESULTS* Echocardiogram 2D Echocardiogram has been performed.  Samuel Germany 09/21/2019, 3:03 PM

## 2019-09-21 NOTE — Telephone Encounter (Signed)
-----   Message from Erma Heritage, [place] sent at 09/21/2019  4:12 PM EDT ----- Please let the patient know that his kidney function has acutely worsened with titration of Torsemide as creatinine was previously 1.20 and is now at 2.33. Would recommend reducing his dosing back to 20 mg daily.  While he has a history of low calcium, this was significantly low at 6.4.  Would recommend starting over-the-counter Calcium supplementation 600 mg daily with vitamin D included.  While the low calcium can be associated with the kidney function, I would recommend that he follow-up with his PCP for further evaluation. Needs repeat BMET in 1 week which can be obtained by [place] or his PCP.  If he develops any associated muscle spasms, tingling in his extremities, new weakness or mental confusion he needs to proceed to the Emergency Department for immediate evaluation.

## 2019-09-25 ENCOUNTER — Other Ambulatory Visit: Payer: Self-pay

## 2019-09-25 NOTE — Patient Outreach (Signed)
[facility]) Care Management  09/25/2019  ANNE SEBRING 1951/11/02 [id number]    2nd outreach attempt to the patient for assessment.  Significant other answered the phone and she stated that he was unable to come to the phone.  HIPAA compliant voicemail was left with contact information.  Plan: RN Health Coach will make outreach attempt to the patient within thirty business  days.   Lazaro Arms RN, BSN, [place] Direct Dial:  [phone]  Fax: [phone]

## 2019-09-26 ENCOUNTER — Telehealth: Payer: Self-pay

## 2019-09-26 NOTE — Telephone Encounter (Signed)
Called pt. No answer. Unable to leave message. Labs were released to pt's MyChart.

## 2019-09-26 NOTE — Telephone Encounter (Signed)
-----   Message from Erma Heritage, [place] sent at 09/21/2019  4:12 PM EDT ----- Please let the patient know that his kidney function has acutely worsened with titration of Torsemide as creatinine was previously 1.20 and is now at 2.33. Would recommend reducing his dosing back to 20 mg daily.  While he has a history of low calcium, this was significantly low at 6.4.  Would recommend starting over-the-counter Calcium supplementation 600 mg daily with vitamin D included.  While the low calcium can be associated with the kidney function, I would recommend that he follow-up with his PCP for further evaluation. Needs repeat BMET in 1 week which can be obtained by [place] or his PCP.  If he develops any associated muscle spasms, tingling in his extremities, new weakness or mental confusion he needs to proceed to the Emergency Department for immediate evaluation.

## 2019-10-05 ENCOUNTER — Ambulatory Visit (INDEPENDENT_AMBULATORY_CARE_PROVIDER_SITE_OTHER): Payer: Medicare Other | Admitting: Cardiovascular Disease

## 2019-10-05 ENCOUNTER — Other Ambulatory Visit (HOSPITAL_COMMUNITY)
Admission: RE | Admit: 2019-10-05 | Discharge: 2019-10-05 | Disposition: A | Discharge status: Home / Self Care | Payer: Medicare Other | Source: Ambulatory Visit | Attending: Cardiovascular Disease | Admitting: Cardiovascular Disease

## 2019-10-05 ENCOUNTER — Other Ambulatory Visit: Payer: Self-pay

## 2019-10-05 ENCOUNTER — Encounter: Payer: Self-pay | Admitting: Cardiovascular Disease

## 2019-10-05 VITALS — BP 82/61 | HR 61 | Temp 97.0°F | Ht 71.0 in | Wt 202.0 lb

## 2019-10-05 DIAGNOSIS — I442 Atrioventricular block, complete: Secondary | ICD-10-CM | POA: Diagnosis not present

## 2019-10-05 DIAGNOSIS — R188 Other ascites: Secondary | ICD-10-CM

## 2019-10-05 DIAGNOSIS — I5023 Acute on chronic systolic (congestive) heart failure: Secondary | ICD-10-CM | POA: Diagnosis not present

## 2019-10-05 DIAGNOSIS — I5032 Chronic diastolic (congestive) heart failure: Secondary | ICD-10-CM

## 2019-10-05 DIAGNOSIS — Z79899 Other long term (current) drug therapy: Secondary | ICD-10-CM

## 2019-10-05 DIAGNOSIS — Z950 Presence of cardiac pacemaker: Secondary | ICD-10-CM | POA: Diagnosis not present

## 2019-10-05 DIAGNOSIS — R600 Localized edema: Secondary | ICD-10-CM

## 2019-10-05 DIAGNOSIS — K7460 Unspecified cirrhosis of liver: Secondary | ICD-10-CM

## 2019-10-05 DIAGNOSIS — J449 Chronic obstructive pulmonary disease, unspecified: Secondary | ICD-10-CM | POA: Diagnosis not present

## 2019-10-05 DIAGNOSIS — I4819 Other persistent atrial fibrillation: Secondary | ICD-10-CM

## 2019-10-05 LAB — COMPREHENSIVE METABOLIC PANEL
ALT: 29 U/L (ref 0–44)
AST: 29 U/L (ref 15–41)
Albumin: 3.1 g/dL — ABNORMAL LOW (ref 3.5–5.0)
Alkaline Phosphatase: 83 U/L (ref 38–126)
Anion gap: 10 (ref 5–15)
BUN: 44 mg/dL — ABNORMAL HIGH (ref 8–23)
CO2: 35 mmol/L — ABNORMAL HIGH (ref 22–32)
Calcium: 7.2 mg/dL — ABNORMAL LOW (ref 8.9–10.3)
Chloride: 86 mmol/L — ABNORMAL LOW (ref 98–111)
Creatinine, Ser: 1.99 mg/dL — ABNORMAL HIGH (ref 0.61–1.24)
GFR calc Af Amer: 39 mL/min — ABNORMAL LOW (ref >60)
GFR calc non Af Amer: 34 mL/min — ABNORMAL LOW (ref >60)
Glucose, Bld: 102 mg/dL — ABNORMAL HIGH (ref 70–99)
Potassium: 3.2 mmol/L — ABNORMAL LOW (ref 3.5–5.1)
Sodium: 131 mmol/L — ABNORMAL LOW (ref 135–145)
Total Bilirubin: 3.2 mg/dL — ABNORMAL HIGH (ref 0.3–1.2)
Total Protein: 6.6 g/dL (ref 6.5–8.1)

## 2019-10-05 MED ORDER — CARVEDILOL 12.5 MG PO TABS: 12.5000 mg | ORAL_TABLET | Freq: Two times a day (BID) | ORAL | 3 refills | Status: DC

## 2019-10-05 NOTE — Progress Notes (Signed)
SUBJECTIVE: Shaun May is a 68 y.o. 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  He saw [place] earlier this month for worsening exertional dyspnea.  Echocardiogram on 09/21/2019 demonstrated normal LV systolic function, EF 60 to 65%, mild LVH, moderate right ventricular enlargement with mildly reduced systolic function, severe right atrial enlargement, mild to moderate tricuspid regurgitation, and moderately elevated pulmonary pressures.  He was initially taking 40 mg of torsemide after the telehealth visit earlier this month but after a bump in creatinine was reduced back to 20 mg daily.  It appears that he was not taking 40 mg very frequently.  Of note, he underwent a paracentesis on 08/08/2019 and 1.7 L of fluid was removed at that time.  He feels chronically fatigued.  He continues to have leg swelling.  His abdomen is distended.  His blood pressure is low.  He was supposed to see his PCP yesterday but their office canceled the visit.     Review of Systems: As per "subjective", otherwise negative.  Allergies  Allergen Reactions  . Xarelto [Rivaroxaban] Other (See Comments)    Caused bleeding.   . Eliquis [Apixaban]     Internal bleeding    Current Outpatient Medications  Medication Sig Dispense Refill  . albuterol (PROVENTIL HFA;VENTOLIN HFA) 108 (90 Base) MCG/ACT inhaler Inhale 1-2 puffs into the lungs every 6 (six) hours as needed for wheezing or shortness of breath. 1 Inhaler 0  . atorvastatin (LIPITOR) 20 MG tablet Take 20 mg by mouth daily at  6 PM.    . carvedilol (COREG) 25 MG tablet Take 25 mg by mouth 2 (two) times daily with a meal.   11  . Dextromethorphan-Guaifenesin (MUCINEX DM MAXIMUM STRENGTH) 60-1200 MG TB12 Take 1 tablet by mouth daily.    . Fluticasone-Umeclidin-Vilant (TRELEGY ELLIPTA) 100-62.5-25 MCG/INH AEPB Inhale 1 puff into the lungs daily. 60 each 11  . ibuprofen (ADVIL,MOTRIN) 200 MG tablet Take 200 mg by mouth every 6 (six) hours as needed.    . loperamide (IMODIUM) 2 MG capsule Take 1 capsule (2 mg total) by mouth daily as needed for diarrhea or loose stools. 30 capsule 0  . LORazepam (ATIVAN) 2 MG tablet Take 2 mg by mouth 2 (two) times daily.     . Magnesium 250 MG TABS Take 500 mg by mouth daily.    Marland Kitchen oxyCODONE-acetaminophen (PERCOCET/ROXICET) 5-325 MG tablet Take 1 tablet by mouth 2 (two) times daily. *May take one tablet three times daily as needed for pain    . OXYGEN Inhale 2 L into the lungs at bedtime. **2L with sleep and exertion if needed    . pantoprazole (PROTONIX) 40 MG tablet Take 40 mg by mouth daily.    Marland Kitchen rOPINIRole (REQUIP) 0.5 MG tablet Take 0.5 mg by mouth at bedtime. Pt. Says he takes 3 tablets at bedtime.  1  . sodium chloride (OCEAN) 0.65 % SOLN nasal spray Place 1 spray into both nostrils as needed for congestion.    . torsemide (DEMADEX) 20 MG tablet Take 2 tablets (40 mg total) by mouth  daily. 180 tablet 3   No current facility-administered medications for this visit.     Past Medical History:  Diagnosis Date  . Alcohol use   . Anemia    when on Xarelto  . Cancer ([place])   . Chronic combined systolic and diastolic CHF (congestive heart failure) (HCC)    a. EF 35-40% by echo in 03/2016 with cath showing nonobstructive CAD b. EF improved to 60-65% by echo in 05/2018)  . CKD (chronic kidney disease), stage II   . COPD (chronic obstructive pulmonary disease) (HCC)    stage 4 COPD  . Dyspnea   . Gastric ulcer   . GI bleeding   . Hematuria   . Hemochromatosis   . Hypertension   .  Lung nodule   . Mobitz type 2 second degree heart block    PPM MDT 10/28/15 Dr. Lovena Le  . NICM (nonischemic cardiomyopathy) ([place])   . Paroxysmal atrial flutter ([place])   . Pericardial effusion   . Persistent atrial fibrillation ([place])   . Presence of permanent cardiac pacemaker    [employer]  . Tobacco abuse     Past Surgical History:  Procedure Laterality Date  . BIOPSY  10/13/2016   Procedure: BIOPSY;  Surgeon: Rogene Houston, MD;  [facility];  Service: Endoscopy;;  esophageal  . CARDIAC CATHETERIZATION N/A 03/31/2016   Procedure: Left Heart Cath and Coronary Angiography;  Surgeon: Wellington Hampshire, MD;  Location: [facility];  Service: Cardiovascular;  Laterality: N/A;  . COLONOSCOPY N/A 10/13/2016   Procedure: COLONOSCOPY;  Surgeon: Rogene Houston, MD;  [facility];  Service: Endoscopy;  Laterality: N/A;  . EP IMPLANTABLE DEVICE N/A 10/28/2015   Procedure: Pacemaker Implant;  Surgeon: Evans Lance, MD;  Location: [facility];  Service: Cardiovascular;  Laterality: N/A;  . EP IMPLANTABLE DEVICE N/A 11/07/2015   Procedure: Pocket Revision;  Surgeon: Evans Lance, MD;  Location: [facility];  Service: Cardiovascular;  Laterality: N/A;  . ESOPHAGOGASTRODUODENOSCOPY N/A 05/27/2016   Procedure: ESOPHAGOGASTRODUODENOSCOPY (EGD);  Surgeon: Rogene Houston, MD;  [facility];  Service: Endoscopy;  Laterality: N/A;  . ESOPHAGOGASTRODUODENOSCOPY N/A 10/13/2016   Procedure: ESOPHAGOGASTRODUODENOSCOPY (EGD);  Surgeon: Rogene Houston, MD;  [facility];  Service: Endoscopy;  Laterality: N/A;  1:30 - moved to 11/8 @ 1:00 - Ann notified pt  . FUDUCIAL PLACEMENT Right 12/29/2017   Procedure: PLACEMENT OF FUDUCIAL;  Surgeon: [name], MD;  Location: [facility];  Service: Thoracic;  Laterality: Right;  . POLYPECTOMY  10/13/2016   Procedure: POLYPECTOMY;  Surgeon: Rogene Houston, MD;  [facility];  Service: Endoscopy;;   transverse colon polypectomy  . TONSILLECTOMY    . VIDEO BRONCHOSCOPY WITH ENDOBRONCHIAL NAVIGATION Right 12/29/2017   Procedure: VIDEO BRONCHOSCOPY WITH ENDOBRONCHIAL NAVIGATION;  Surgeon: [name], MD;  Location: [facility];  Service: Thoracic;  Laterality: Right;    Social History   Socioeconomic History  . Marital status: Divorced    Spouse name: Not on file  . Number of children: Not on file  . Years of education: Not on file  . Highest education level: Not on file  Occupational History  . Not on file  Social Needs  . Financial resource strain: Not on file  . Food insecurity    Worry: Not on file    Inability: Not on file  . Transportation needs    Medical: Not on file  Non-medical: Not on file  Tobacco Use  . Smoking status: Current Every Day Smoker    Packs/day: 0.25    Years: 56.00    Pack years: 14.00    Types: E-cigarettes, Cigarettes  . Smokeless tobacco: Never Used  Substance and Sexual Activity  . Alcohol use: Yes    Alcohol/week: 3.0 standard drinks    Types: 3 Shots of liquor per week    Comment: nightly  . Drug use: Never  . Sexual activity: Yes    Birth control/protection: None  Lifestyle  . Physical activity    Days per week: Not on file    Minutes per session: Not on file  . Stress: Not on file  Relationships  . Social Herbalist on phone: Not on file    Gets together: Not on file    Attends religious service: Not on file    Active member of club or organization: Not on file    Attends meetings of clubs or organizations: Not on file    Relationship status: Not on file  . Intimate partner violence    Fear of current or ex partner: Not on file    Emotionally abused: Not on file    Physically abused: Not on file    Forced sexual activity: Not on file  Other Topics Concern  . Not on file  Social History Narrative  . Not on file     Vitals:   10/05/19 1332  BP: (!) 82/61  Pulse: 61  Temp: (!) 97 F (36.1 C)  SpO2:  (!) 84%  Weight: 202 lb (91.6 kg)  Height: 5\' 11"  (1.803 m)    Wt Readings from Last 3 Encounters:  10/05/19 202 lb (91.6 kg)  09/06/19 202 lb (91.6 kg)  08/10/19 215 lb (97.5 kg)     PHYSICAL EXAM General: Chronically ill-appearing HEENT: Normal. Neck: No JVD, no thyromegaly. Lungs: Diffusely diminished breath sounds. CV: Regular rate and rhythm, normal S1/S2, no S3/S4, no murmur.  Chronic bilateral leg edema.   Abdomen: Firm, distended, nontender. Neurologic: Alert and oriented.  Psych: Normal affect. Skin: Normal. Musculoskeletal: No gross deformities.      Labs: Lab Results  Component Value Date/Time   K 3.2 (L) 09/21/2019 03:01 PM   BUN 28 (H) 09/21/2019 03:01 PM   BUN 8 02/21/2017 03:33 PM   CREATININE 2.33 (H) 09/21/2019 03:01 PM   CREATININE 1.15 08/20/2019 02:38 PM   ALT 23 08/10/2019 11:36 PM   TSH 0.755 05/25/2016 10:10 PM   HGB 10.3 (L) 08/10/2019 11:36 PM   HGB 13.8 02/21/2017 03:33 PM     Lipids: No results found for: LDLCALC, LDLDIRECT, CHOL, TRIG, HDL     ASSESSMENT AND PLAN: 1.  Chronic diastolic heart failure/history of nonischemic cardiomyopathy: Normal LV systolic function by most recent echocardiogram as detailed above.  He is currently on torsemide 20 mg daily.  Weight stable from October 1.  His abdomen is distended.  His creatinine rose from 1.2 on 9/23 to 2.33 on 09/21/2019 after intermittent doses of 40 mg torsemide.  I think he would benefit from paracentesis.  I have spoken to his significant other and asked her to call GI.  I will obtain a comprehensive metabolic panel.  He was hyponatremic with a sodium 127 on 09/21/2019.  He was also hyperkalemic with a potassium of 3.2.  2.  Persistent atrial fibrillation/flutter: He is not on anticoagulation given history of GI bleed and hematuria.  Currently on Coreg  25 mg twice daily for heart rate control.  He is hypotensive.  I will reduce carvedilol to 12.5 mg twice daily.  I will not have him  take any carvedilol this evening.  3.  Complete heart block: Status post permanent pacemaker placement in 2016.  Followed by Dr. Lovena Le.  Normal device function by most recent interrogation.  4.  COPD/chronic hypoxic respiratory failure: He is on 2 L nasal cannula at baseline.  5.  Right ventricular enlargement/mildly reduced systolic function: This is secondary to COPD and chronic hypoxic respiratory failure.    Disposition: Follow up 4 months with [facility]  Time spent: 40 minutes, of which greater than 50% was spent reviewing symptoms, relevant blood tests and studies, and discussing management plan with the patient.    Kate Sable, M.D., F.A.C.C.

## 2019-10-05 NOTE — Patient Instructions (Addendum)
.  Medication Instructions:  DECREASE Coreg to 12.5 mg twice a day. You make cut your 25 mg tablet in half, and take 1/2 tablet (12.5 mg ) twice a day   Do NOT take COREG tonight, start with lower dose tomorrow  *If you need a refill on your cardiac medications before your next appointment, please call your pharmacy*  Lab Work: CMET today If you have labs (blood work) drawn today and your tests are completely normal, you will receive your results only by: Marland Kitchen MyChart Message (if you have MyChart) OR . A paper copy in the mail If you have any lab test that is abnormal or we need to change your treatment, we will call you to review the results.  Testing/Procedures: None today  Follow-Up: At [facility], you and your health needs are our priority.  As part of our continuing mission to provide you with exceptional heart care, we have created designated Provider Care Teams.  These Care Teams include your primary Cardiologist (physician) and Advanced Practice Providers (APPs -  Physician Assistants and Nurse Practitioners) who all work together to provide you with the care you need, when you need it.  Your next appointment:   4 months  The format for your next appointment:   In Person  Provider:   Bernerd Pho, PA-C  Other Instructions Call Dr.Rehman for an apt.   Take blood pressure daily      Thank you for choosing [facility] !

## 2019-10-10 ENCOUNTER — Telehealth (INDEPENDENT_AMBULATORY_CARE_PROVIDER_SITE_OTHER): Payer: Self-pay | Admitting: Nurse Practitioner

## 2019-10-10 DIAGNOSIS — K7031 Alcoholic cirrhosis of liver with ascites: Secondary | ICD-10-CM | POA: Diagnosis not present

## 2019-10-10 DIAGNOSIS — R944 Abnormal results of kidney function studies: Secondary | ICD-10-CM | POA: Diagnosis not present

## 2019-10-10 DIAGNOSIS — Z6829 Body mass index (BMI) 29.0-29.9, adult: Secondary | ICD-10-CM | POA: Diagnosis not present

## 2019-10-10 DIAGNOSIS — G2581 Restless legs syndrome: Secondary | ICD-10-CM | POA: Diagnosis not present

## 2019-10-10 DIAGNOSIS — I10 Essential (primary) hypertension: Secondary | ICD-10-CM | POA: Diagnosis not present

## 2019-10-10 DIAGNOSIS — J060 Acute laryngopharyngitis: Secondary | ICD-10-CM | POA: Diagnosis not present

## 2019-10-10 DIAGNOSIS — F410 Panic disorder [episodic paroxysmal anxiety] without agoraphobia: Secondary | ICD-10-CM | POA: Diagnosis not present

## 2019-10-10 DIAGNOSIS — G894 Chronic pain syndrome: Secondary | ICD-10-CM | POA: Diagnosis not present

## 2019-10-10 DIAGNOSIS — Z720 Tobacco use: Secondary | ICD-10-CM | POA: Diagnosis not present

## 2019-10-10 DIAGNOSIS — I5032 Chronic diastolic (congestive) heart failure: Secondary | ICD-10-CM | POA: Diagnosis not present

## 2019-10-10 DIAGNOSIS — F419 Anxiety disorder, unspecified: Secondary | ICD-10-CM | POA: Diagnosis not present

## 2019-10-10 DIAGNOSIS — I429 Cardiomyopathy, unspecified: Secondary | ICD-10-CM | POA: Diagnosis not present

## 2019-10-10 DIAGNOSIS — I872 Venous insufficiency (chronic) (peripheral): Secondary | ICD-10-CM | POA: Diagnosis not present

## 2019-10-10 DIAGNOSIS — F17218 Nicotine dependence, cigarettes, with other nicotine-induced disorders: Secondary | ICD-10-CM | POA: Diagnosis not present

## 2019-10-10 DIAGNOSIS — H25011 Cortical age-related cataract, right eye: Secondary | ICD-10-CM | POA: Diagnosis not present

## 2019-10-10 DIAGNOSIS — I480 Paroxysmal atrial fibrillation: Secondary | ICD-10-CM | POA: Diagnosis not present

## 2019-10-10 DIAGNOSIS — Z9981 Dependence on supplemental oxygen: Secondary | ICD-10-CM | POA: Diagnosis not present

## 2019-10-10 DIAGNOSIS — R7301 Impaired fasting glucose: Secondary | ICD-10-CM | POA: Diagnosis not present

## 2019-10-10 DIAGNOSIS — J9611 Chronic respiratory failure with hypoxia: Secondary | ICD-10-CM | POA: Diagnosis not present

## 2019-10-10 NOTE — Telephone Encounter (Signed)
Dr.Zach [place] Crane was called and a voice message left sharing with him what Shaun May said  After talking with Dr.Rehman. Patient has not been seen here since 08/2019 and he was a no show for last appointment. Dr.Rehman suggest that Dr.Hall order the Paracentesis.

## 2019-10-10 NOTE — Telephone Encounter (Signed)
Tammy, I spoke with Dr. Laural Golden, and he did not advise me to order paracentesis orders since pt was last seen in office in Sept 2020 and he now showed for his follow up appt. Dr. Laural Golden requested you call Dr. Juel Burrow office and let them know his input. thx

## 2019-10-10 NOTE — Telephone Encounter (Signed)
Dr Josue Hector office called and wanted [place] to order a paracentesis for this patient - ph# [phone]

## 2019-10-15 ENCOUNTER — Other Ambulatory Visit (INDEPENDENT_AMBULATORY_CARE_PROVIDER_SITE_OTHER): Payer: Self-pay | Admitting: *Deleted

## 2019-10-15 DIAGNOSIS — R188 Other ascites: Secondary | ICD-10-CM

## 2019-10-15 MED ORDER — ALBUMIN HUMAN 25 % IV SOLN: 12.5000 g | Freq: Once | INTRAVENOUS | Status: DC

## 2019-10-15 MED ORDER — ALBUMIN HUMAN 25 % IV SOLN
12.5000 g | Freq: Once | INTRAVENOUS | Status: AC
  Administered 2019-10-24: 12.5 g via INTRAVENOUS

## 2019-10-15 NOTE — Progress Notes (Signed)
albumin

## 2019-10-18 ENCOUNTER — Ambulatory Visit (HOSPITAL_COMMUNITY): Payer: Medicare Other

## 2019-10-24 ENCOUNTER — Ambulatory Visit (HOSPITAL_COMMUNITY)
Admission: RE | Admit: 2019-10-24 | Discharge: 2019-10-24 | Disposition: A | Discharge status: Home / Self Care | Payer: Medicare Other | Source: Ambulatory Visit | Attending: Internal Medicine | Admitting: Internal Medicine

## 2019-10-24 ENCOUNTER — Encounter (HOSPITAL_COMMUNITY): Payer: Self-pay

## 2019-10-24 ENCOUNTER — Other Ambulatory Visit: Payer: Self-pay | Admitting: *Deleted

## 2019-10-24 ENCOUNTER — Other Ambulatory Visit: Payer: Self-pay

## 2019-10-24 DIAGNOSIS — R188 Other ascites: Secondary | ICD-10-CM | POA: Diagnosis not present

## 2019-10-24 LAB — GRAM STAIN: Gram Stain: NONE SEEN

## 2019-10-24 LAB — BODY FLUID CELL COUNT WITH DIFFERENTIAL
Eos, Fluid: 0 %
Lymphs, Fluid: 8 %
Monocyte-Macrophage-Serous Fluid: 11 % — ABNORMAL LOW (ref 50–90)
Neutrophil Count, Fluid: 81 % — ABNORMAL HIGH (ref 0–25)
Other Cells, Fluid: 1 %
Total Nucleated Cell Count, Fluid: 439 cu mm (ref 0–1000)

## 2019-10-24 MED ORDER — ALBUMIN HUMAN 25 % IV SOLN
INTRAVENOUS | Status: AC
  Filled 2019-10-24: qty 200

## 2019-10-24 NOTE — Procedures (Signed)
PreOperative Dx: Cirrhosis, ascites Postoperative Dx: Cirrhosis, ascites Procedure:   US guided paracentesis Radiologist:  Thornton Papas Anesthesia:  10 ml of1% lidocaine Specimen:  2.9 L of amber colored ascitic fluid EBL:   < 1 ml Complications: None

## 2019-10-24 NOTE — Patient Outreach (Signed)
[facility]) Care Management  10/24/2019  Shaun May Mar 17, 1951 [id number]   RN Health Coach Monthly Outreach  Referral Date:  06/03/2019 Referral Source:  EMMI Alert Screening Reason for Referral:  Disease Management Education Insurance:  Medicare   Outreach Attempt:  Outreach attempt #3 to patient for follow up. No answer and unable to leave voicemail message due to voicemail did not engage.  Plan:  RN Health Coach will make another outreach attempt within the month of December.   [place] [phone] [email or web address]

## 2019-10-24 NOTE — Progress Notes (Signed)
Paracentesis complete no signs of distress.  

## 2019-10-25 DIAGNOSIS — L89312 Pressure ulcer of right buttock, stage 2: Secondary | ICD-10-CM | POA: Diagnosis not present

## 2019-10-25 DIAGNOSIS — L03116 Cellulitis of left lower limb: Secondary | ICD-10-CM | POA: Diagnosis not present

## 2019-10-25 DIAGNOSIS — L89322 Pressure ulcer of left buttock, stage 2: Secondary | ICD-10-CM | POA: Diagnosis not present

## 2019-10-25 LAB — PATHOLOGIST SMEAR REVIEW

## 2019-10-26 ENCOUNTER — Ambulatory Visit: Payer: Self-pay

## 2019-10-28 LAB — CULTURE, BODY FLUID W GRAM STAIN -BOTTLE: Culture: NO GROWTH

## 2019-10-30 ENCOUNTER — Inpatient Hospital Stay (HOSPITAL_COMMUNITY)
Admission: EM | Admit: 2019-10-30 | Discharge: 2019-11-07 | DRG: 602 | Disposition: A | Discharge status: Skilled Nursing Facility | Payer: Medicare Other | Attending: Internal Medicine | Admitting: Internal Medicine

## 2019-10-30 ENCOUNTER — Encounter (HOSPITAL_COMMUNITY): Payer: Self-pay | Admitting: Emergency Medicine

## 2019-10-30 ENCOUNTER — Emergency Department (HOSPITAL_COMMUNITY): Payer: Medicare Other

## 2019-10-30 ENCOUNTER — Other Ambulatory Visit: Payer: Self-pay

## 2019-10-30 DIAGNOSIS — R296 Repeated falls: Secondary | ICD-10-CM | POA: Diagnosis not present

## 2019-10-30 DIAGNOSIS — J9601 Acute respiratory failure with hypoxia: Secondary | ICD-10-CM | POA: Diagnosis present

## 2019-10-30 DIAGNOSIS — M79605 Pain in left leg: Secondary | ICD-10-CM | POA: Diagnosis not present

## 2019-10-30 DIAGNOSIS — I4819 Other persistent atrial fibrillation: Secondary | ICD-10-CM | POA: Diagnosis present

## 2019-10-30 DIAGNOSIS — J90 Pleural effusion, not elsewhere classified: Secondary | ICD-10-CM | POA: Diagnosis not present

## 2019-10-30 DIAGNOSIS — G9341 Metabolic encephalopathy: Secondary | ICD-10-CM | POA: Diagnosis not present

## 2019-10-30 DIAGNOSIS — L89312 Pressure ulcer of right buttock, stage 2: Secondary | ICD-10-CM | POA: Diagnosis present

## 2019-10-30 DIAGNOSIS — I130 Hypertensive heart and chronic kidney disease with heart failure and stage 1 through stage 4 chronic kidney disease, or unspecified chronic kidney disease: Secondary | ICD-10-CM | POA: Diagnosis present

## 2019-10-30 DIAGNOSIS — E876 Hypokalemia: Secondary | ICD-10-CM | POA: Diagnosis not present

## 2019-10-30 DIAGNOSIS — G2581 Restless legs syndrome: Secondary | ICD-10-CM | POA: Diagnosis present

## 2019-10-30 DIAGNOSIS — I441 Atrioventricular block, second degree: Secondary | ICD-10-CM | POA: Diagnosis present

## 2019-10-30 DIAGNOSIS — J690 Pneumonitis due to inhalation of food and vomit: Secondary | ICD-10-CM | POA: Diagnosis present

## 2019-10-30 DIAGNOSIS — L03116 Cellulitis of left lower limb: Principal | ICD-10-CM | POA: Diagnosis present

## 2019-10-30 DIAGNOSIS — F10231 Alcohol dependence with withdrawal delirium: Secondary | ICD-10-CM | POA: Diagnosis not present

## 2019-10-30 DIAGNOSIS — F1010 Alcohol abuse, uncomplicated: Secondary | ICD-10-CM | POA: Diagnosis present

## 2019-10-30 DIAGNOSIS — I10 Essential (primary) hypertension: Secondary | ICD-10-CM | POA: Diagnosis not present

## 2019-10-30 DIAGNOSIS — K7031 Alcoholic cirrhosis of liver with ascites: Secondary | ICD-10-CM | POA: Diagnosis not present

## 2019-10-30 DIAGNOSIS — R0902 Hypoxemia: Secondary | ICD-10-CM

## 2019-10-30 DIAGNOSIS — D631 Anemia in chronic kidney disease: Secondary | ICD-10-CM | POA: Diagnosis present

## 2019-10-30 DIAGNOSIS — I442 Atrioventricular block, complete: Secondary | ICD-10-CM | POA: Diagnosis present

## 2019-10-30 DIAGNOSIS — J9690 Respiratory failure, unspecified, unspecified whether with hypoxia or hypercapnia: Secondary | ICD-10-CM | POA: Diagnosis not present

## 2019-10-30 DIAGNOSIS — J9622 Acute and chronic respiratory failure with hypercapnia: Secondary | ICD-10-CM | POA: Diagnosis not present

## 2019-10-30 DIAGNOSIS — I5030 Unspecified diastolic (congestive) heart failure: Secondary | ICD-10-CM | POA: Diagnosis not present

## 2019-10-30 DIAGNOSIS — L89322 Pressure ulcer of left buttock, stage 2: Secondary | ICD-10-CM | POA: Diagnosis present

## 2019-10-30 DIAGNOSIS — I5042 Chronic combined systolic (congestive) and diastolic (congestive) heart failure: Secondary | ICD-10-CM | POA: Diagnosis present

## 2019-10-30 DIAGNOSIS — A419 Sepsis, unspecified organism: Secondary | ICD-10-CM | POA: Diagnosis not present

## 2019-10-30 DIAGNOSIS — Z950 Presence of cardiac pacemaker: Secondary | ICD-10-CM | POA: Diagnosis not present

## 2019-10-30 DIAGNOSIS — R5381 Other malaise: Secondary | ICD-10-CM | POA: Diagnosis not present

## 2019-10-30 DIAGNOSIS — I483 Typical atrial flutter: Secondary | ICD-10-CM | POA: Diagnosis not present

## 2019-10-30 DIAGNOSIS — R0602 Shortness of breath: Secondary | ICD-10-CM | POA: Diagnosis not present

## 2019-10-30 DIAGNOSIS — M7989 Other specified soft tissue disorders: Secondary | ICD-10-CM | POA: Diagnosis not present

## 2019-10-30 DIAGNOSIS — J449 Chronic obstructive pulmonary disease, unspecified: Secondary | ICD-10-CM | POA: Diagnosis present

## 2019-10-30 DIAGNOSIS — Z820 Family history of epilepsy and other diseases of the nervous system: Secondary | ICD-10-CM

## 2019-10-30 DIAGNOSIS — J9621 Acute and chronic respiratory failure with hypoxia: Secondary | ICD-10-CM | POA: Diagnosis present

## 2019-10-30 DIAGNOSIS — N179 Acute kidney failure, unspecified: Secondary | ICD-10-CM | POA: Diagnosis not present

## 2019-10-30 DIAGNOSIS — N1832 Chronic kidney disease, stage 3b: Secondary | ICD-10-CM | POA: Diagnosis present

## 2019-10-30 DIAGNOSIS — L8990 Pressure ulcer of unspecified site, unspecified stage: Secondary | ICD-10-CM | POA: Insufficient documentation

## 2019-10-30 DIAGNOSIS — I4892 Unspecified atrial flutter: Secondary | ICD-10-CM | POA: Diagnosis present

## 2019-10-30 DIAGNOSIS — Z833 Family history of diabetes mellitus: Secondary | ICD-10-CM

## 2019-10-30 DIAGNOSIS — Z20828 Contact with and (suspected) exposure to other viral communicable diseases: Secondary | ICD-10-CM | POA: Diagnosis not present

## 2019-10-30 DIAGNOSIS — L03115 Cellulitis of right lower limb: Secondary | ICD-10-CM | POA: Diagnosis present

## 2019-10-30 DIAGNOSIS — R52 Pain, unspecified: Secondary | ICD-10-CM

## 2019-10-30 DIAGNOSIS — R627 Adult failure to thrive: Secondary | ICD-10-CM | POA: Diagnosis present

## 2019-10-30 DIAGNOSIS — F17210 Nicotine dependence, cigarettes, uncomplicated: Secondary | ICD-10-CM | POA: Diagnosis present

## 2019-10-30 DIAGNOSIS — I428 Other cardiomyopathies: Secondary | ICD-10-CM | POA: Diagnosis present

## 2019-10-30 DIAGNOSIS — D696 Thrombocytopenia, unspecified: Secondary | ICD-10-CM | POA: Diagnosis not present

## 2019-10-30 DIAGNOSIS — R188 Other ascites: Secondary | ICD-10-CM | POA: Diagnosis not present

## 2019-10-30 DIAGNOSIS — I5032 Chronic diastolic (congestive) heart failure: Secondary | ICD-10-CM | POA: Diagnosis not present

## 2019-10-30 DIAGNOSIS — Z9981 Dependence on supplemental oxygen: Secondary | ICD-10-CM

## 2019-10-30 DIAGNOSIS — M79604 Pain in right leg: Secondary | ICD-10-CM

## 2019-10-30 DIAGNOSIS — Z7951 Long term (current) use of inhaled steroids: Secondary | ICD-10-CM

## 2019-10-30 DIAGNOSIS — Z6828 Body mass index (BMI) 28.0-28.9, adult: Secondary | ICD-10-CM

## 2019-10-30 DIAGNOSIS — R0600 Dyspnea, unspecified: Secondary | ICD-10-CM

## 2019-10-30 DIAGNOSIS — Z888 Allergy status to other drugs, medicaments and biological substances status: Secondary | ICD-10-CM

## 2019-10-30 DIAGNOSIS — K7460 Unspecified cirrhosis of liver: Secondary | ICD-10-CM | POA: Diagnosis present

## 2019-10-30 DIAGNOSIS — R6520 Severe sepsis without septic shock: Secondary | ICD-10-CM | POA: Diagnosis not present

## 2019-10-30 LAB — COMPREHENSIVE METABOLIC PANEL
ALT: 18 U/L (ref 0–44)
AST: 34 U/L (ref 15–41)
Albumin: 3.1 g/dL — ABNORMAL LOW (ref 3.5–5.0)
Alkaline Phosphatase: 86 U/L (ref 38–126)
Anion gap: 13 (ref 5–15)
BUN: 62 mg/dL — ABNORMAL HIGH (ref 8–23)
CO2: 32 mmol/L (ref 22–32)
Calcium: 7 mg/dL — ABNORMAL LOW (ref 8.9–10.3)
Chloride: 87 mmol/L — ABNORMAL LOW (ref 98–111)
Creatinine, Ser: 1.95 mg/dL — ABNORMAL HIGH (ref 0.61–1.24)
GFR calc Af Amer: 40 mL/min — ABNORMAL LOW (ref >60)
GFR calc non Af Amer: 35 mL/min — ABNORMAL LOW (ref >60)
Glucose, Bld: 90 mg/dL (ref 70–99)
Potassium: 3.1 mmol/L — ABNORMAL LOW (ref 3.5–5.1)
Sodium: 132 mmol/L — ABNORMAL LOW (ref 135–145)
Total Bilirubin: 2 mg/dL — ABNORMAL HIGH (ref 0.3–1.2)
Total Protein: 7 g/dL (ref 6.5–8.1)

## 2019-10-30 LAB — POC SARS CORONAVIRUS 2 AG -  ED: SARS Coronavirus 2 Ag: NEGATIVE

## 2019-10-30 LAB — CBC WITH DIFFERENTIAL/PLATELET
Abs Immature Granulocytes: 0.03 10*3/uL (ref 0.00–0.07)
Basophils Absolute: 0 10*3/uL (ref 0.0–0.1)
Basophils Relative: 0 %
Eosinophils Absolute: 0 10*3/uL (ref 0.0–0.5)
Eosinophils Relative: 0 %
HCT: 35.2 % — ABNORMAL LOW (ref 39.0–52.0)
Hemoglobin: 10.5 g/dL — ABNORMAL LOW (ref 13.0–17.0)
Immature Granulocytes: 0 %
Lymphocytes Relative: 2 %
Lymphs Abs: 0.2 10*3/uL — ABNORMAL LOW (ref 0.7–4.0)
MCH: 29.5 pg (ref 26.0–34.0)
MCHC: 29.8 g/dL — ABNORMAL LOW (ref 30.0–36.0)
MCV: 98.9 fL (ref 80.0–100.0)
Monocytes Absolute: 0.2 10*3/uL (ref 0.1–1.0)
Monocytes Relative: 3 %
Neutro Abs: 8.7 10*3/uL — ABNORMAL HIGH (ref 1.7–7.7)
Neutrophils Relative %: 95 %
Platelets: 132 10*3/uL — ABNORMAL LOW (ref 150–400)
RBC: 3.56 MIL/uL — ABNORMAL LOW (ref 4.22–5.81)
RDW: 16.8 % — ABNORMAL HIGH (ref 11.5–15.5)
WBC: 9.2 10*3/uL (ref 4.0–10.5)
nRBC: 0.2 % (ref 0.0–0.2)

## 2019-10-30 LAB — PROTIME-INR
INR: 1.4 — ABNORMAL HIGH (ref 0.8–1.2)
Prothrombin Time: 17 seconds — ABNORMAL HIGH (ref 11.4–15.2)

## 2019-10-30 MED ORDER — IBUPROFEN 400 MG PO TABS: 200.0000 mg | ORAL_TABLET | Freq: Four times a day (QID) | ORAL | Status: DC | PRN

## 2019-10-30 MED ORDER — MAGNESIUM OXIDE 400 (241.3 MG) MG PO TABS
200.0000 mg | ORAL_TABLET | Freq: Every day | ORAL | Status: DC
  Administered 2019-10-31: 200 mg via ORAL
  Filled 2019-10-30: qty 1

## 2019-10-30 MED ORDER — SODIUM CHLORIDE 0.9 % IV SOLN: 250.0000 mL | INTRAVENOUS | Status: DC | PRN

## 2019-10-30 MED ORDER — CARVEDILOL 12.5 MG PO TABS
12.5000 mg | ORAL_TABLET | Freq: Two times a day (BID) | ORAL | Status: DC
  Administered 2019-10-31 (×2): 12.5 mg via ORAL
  Filled 2019-10-30 (×3): qty 1

## 2019-10-30 MED ORDER — AEROCHAMBER Z-STAT PLUS/MEDIUM MISC
Status: AC
  Filled 2019-10-30: qty 1

## 2019-10-30 MED ORDER — FENTANYL CITRATE (PF) 100 MCG/2ML IJ SOLN
50.0000 ug | Freq: Once | INTRAMUSCULAR | Status: AC
  Administered 2019-10-30: 50 ug via INTRAVENOUS
  Filled 2019-10-30: qty 2

## 2019-10-30 MED ORDER — OXYCODONE-ACETAMINOPHEN 5-325 MG PO TABS
1.0000 | ORAL_TABLET | Freq: Four times a day (QID) | ORAL | Status: DC
  Administered 2019-10-30 – 2019-10-31 (×3): 1 via ORAL
  Filled 2019-10-30 (×4): qty 1

## 2019-10-30 MED ORDER — ALBUTEROL SULFATE (2.5 MG/3ML) 0.083% IN NEBU: 3.0000 mL | INHALATION_SOLUTION | Freq: Four times a day (QID) | RESPIRATORY_TRACT | Status: DC | PRN

## 2019-10-30 MED ORDER — ENOXAPARIN SODIUM 40 MG/0.4ML ~~LOC~~ SOLN
40.0000 mg | SUBCUTANEOUS | Status: DC
  Administered 2019-10-30 – 2019-11-06 (×8): 40 mg via SUBCUTANEOUS
  Filled 2019-10-30 (×8): qty 0.4

## 2019-10-30 MED ORDER — DM-GUAIFENESIN ER 30-600 MG PO TB12
1.0000 | ORAL_TABLET | Freq: Every day | ORAL | Status: DC
  Administered 2019-10-30 – 2019-10-31 (×2): 1 via ORAL
  Filled 2019-10-30 (×2): qty 1

## 2019-10-30 MED ORDER — UMECLIDINIUM BROMIDE 62.5 MCG/INH IN AEPB
1.0000 | INHALATION_SPRAY | Freq: Every day | RESPIRATORY_TRACT | Status: DC
  Administered 2019-10-31 – 2019-11-07 (×6): 1 via RESPIRATORY_TRACT
  Filled 2019-10-30: qty 7

## 2019-10-30 MED ORDER — DOCUSATE SODIUM 100 MG PO CAPS
100.0000 mg | ORAL_CAPSULE | Freq: Two times a day (BID) | ORAL | Status: DC
  Administered 2019-10-30 – 2019-10-31 (×3): 100 mg via ORAL
  Filled 2019-10-30 (×3): qty 1

## 2019-10-30 MED ORDER — ACETAMINOPHEN 650 MG RE SUPP: 650.0000 mg | Freq: Four times a day (QID) | RECTAL | Status: DC | PRN

## 2019-10-30 MED ORDER — SODIUM CHLORIDE 0.9 % IV BOLUS
1000.0000 mL | Freq: Once | INTRAVENOUS | Status: AC
  Administered 2019-10-30: 1000 mL via INTRAVENOUS

## 2019-10-30 MED ORDER — ONDANSETRON HCL 4 MG/2ML IJ SOLN
4.0000 mg | Freq: Once | INTRAMUSCULAR | Status: AC
  Administered 2019-10-30: 4 mg via INTRAVENOUS
  Filled 2019-10-30: qty 2

## 2019-10-30 MED ORDER — FLUTICASONE-UMECLIDIN-VILANT 100-62.5-25 MCG/INH IN AEPB: 1.0000 | INHALATION_SPRAY | Freq: Every day | RESPIRATORY_TRACT | Status: DC

## 2019-10-30 MED ORDER — ACETAMINOPHEN 325 MG PO TABS: 650.0000 mg | ORAL_TABLET | Freq: Four times a day (QID) | ORAL | Status: DC | PRN

## 2019-10-30 MED ORDER — ALBUTEROL SULFATE HFA 108 (90 BASE) MCG/ACT IN AERS
4.0000 | INHALATION_SPRAY | Freq: Once | RESPIRATORY_TRACT | Status: AC
  Administered 2019-10-30: 4 via RESPIRATORY_TRACT
  Filled 2019-10-30: qty 6.7

## 2019-10-30 MED ORDER — SODIUM CHLORIDE 0.9% FLUSH
3.0000 mL | Freq: Two times a day (BID) | INTRAVENOUS | Status: DC
  Administered 2019-10-30 – 2019-11-07 (×16): 3 mL via INTRAVENOUS

## 2019-10-30 MED ORDER — TORSEMIDE 20 MG PO TABS
20.0000 mg | ORAL_TABLET | Freq: Three times a day (TID) | ORAL | Status: DC
  Administered 2019-10-30 – 2019-10-31 (×3): 20 mg via ORAL
  Filled 2019-10-30 (×3): qty 1

## 2019-10-30 MED ORDER — ROPINIROLE HCL 1 MG PO TABS
1.0000 mg | ORAL_TABLET | Freq: Every day | ORAL | Status: DC
  Administered 2019-10-30 – 2019-10-31 (×2): 1 mg via ORAL
  Filled 2019-10-30 (×6): qty 1

## 2019-10-30 MED ORDER — METHYLPREDNISOLONE SODIUM SUCC 125 MG IJ SOLR
125.0000 mg | Freq: Once | INTRAMUSCULAR | Status: AC
  Administered 2019-10-30: 125 mg via INTRAVENOUS
  Filled 2019-10-30: qty 2

## 2019-10-30 MED ORDER — PANTOPRAZOLE SODIUM 40 MG PO TBEC
40.0000 mg | DELAYED_RELEASE_TABLET | Freq: Every day | ORAL | Status: DC
  Administered 2019-10-30 – 2019-10-31 (×2): 40 mg via ORAL
  Filled 2019-10-30 (×2): qty 1

## 2019-10-30 MED ORDER — SODIUM CHLORIDE 0.9% FLUSH
3.0000 mL | INTRAVENOUS | Status: DC | PRN
  Administered 2019-11-03: 3 mL via INTRAVENOUS
  Filled 2019-10-30: qty 3

## 2019-10-30 MED ORDER — FLUTICASONE FUROATE-VILANTEROL 100-25 MCG/INH IN AEPB
1.0000 | INHALATION_SPRAY | Freq: Every day | RESPIRATORY_TRACT | Status: DC
  Administered 2019-10-31 – 2019-11-07 (×6): 1 via RESPIRATORY_TRACT
  Filled 2019-10-30: qty 28

## 2019-10-30 MED ORDER — ATORVASTATIN CALCIUM 40 MG PO TABS
40.0000 mg | ORAL_TABLET | Freq: Every evening | ORAL | Status: DC
  Administered 2019-10-30 – 2019-10-31 (×2): 40 mg via ORAL
  Filled 2019-10-30 (×2): qty 1

## 2019-10-30 MED ORDER — LORAZEPAM 1 MG PO TABS
2.0000 mg | ORAL_TABLET | Freq: Two times a day (BID) | ORAL | Status: DC
  Administered 2019-10-30 – 2019-10-31 (×2): 2 mg via ORAL
  Filled 2019-10-30 (×3): qty 2

## 2019-10-30 NOTE — ED Notes (Signed)
Girlfriend called for an update on patient. This nurse told girlfriend that pt would be admitted.

## 2019-10-30 NOTE — ED Triage Notes (Signed)
PT brought to ED today by his son for worsening in redness and weeping to bilateral lower legs over the past week. PT's son states pt doesn't take his medications as prescribed and a home health nurse started coming today and advised them to come to  ED due to increased fluid retention and more confusion in the evenings. PT has had multiple falls this past weekend and had to lay in the floor for hours until the family could arrive there to get him up. PT oxygen sats 84% on 2L n/c when he arrived to ED.

## 2019-10-30 NOTE — Plan of Care (Signed)
  Problem: Education: Goal: Knowledge of General Education information will improve Description Including pain rating scale, medication(s)/side effects and non-pharmacologic comfort measures Outcome: Progressing   Problem: Health Behavior/Discharge Planning: Goal: Ability to manage health-related needs will improve Outcome: Progressing   

## 2019-10-30 NOTE — ED Provider Notes (Signed)
[facility] EMERGENCY DEPARTMENT Provider Note   CSN: [id number] Arrival date & time: 10/30/19  1127     History   Chief Complaint Chief Complaint  Patient presents with  . Recurrent Skin Infections    HPI Shaun May is a 68 y.o. male past medical history of alcohol use, anemia, cancer, CKD, COPD, hematuria, hypertension who presents for evaluation of worsening bilateral lower extremity wounds, pain.  Patient is very poor historian.  He states that his legs have been hurting him for very long time.  He states that he does not know when the left lower extremity started becoming more red.  He states that today, his home health nurse changed his leg wraps and noticed that he had a wound to the anterior aspect and that the left one appeared more erythematous.  He states that it does hurt.  He also states he has a wound to his buttock.  Patient states he has COPD noise has some baseline difficulty breathing.  He is on 2 L home O2 and has not had to increase that recently.  He has not noted any fever.  He denies any chest pain, fever, abdominal pain.     The history is provided by the patient.    Past Medical History:  Diagnosis Date  . Alcohol use   . Anemia    when on Xarelto  . Cancer ([place])   . Chronic combined systolic and diastolic CHF (congestive heart failure) (HCC)    a. EF 35-40% by echo in 03/2016 with cath showing nonobstructive CAD b. EF improved to 60-65% by echo in 05/2018)  . CKD (chronic kidney disease), stage II   . COPD (chronic obstructive pulmonary disease) (HCC)    stage 4 COPD  . Dyspnea   . Gastric ulcer   . GI bleeding   . Hematuria   . Hemochromatosis   . Hypertension   . Lung nodule   . Mobitz type 2 second degree heart block    PPM MDT 10/28/15 Dr. Lovena Le  . NICM (nonischemic cardiomyopathy) ([place])   . Paroxysmal atrial flutter ([place])   . Pericardial effusion   . Persistent atrial fibrillation ([place])   . Presence of permanent cardiac pacemaker    [employer]  . Tobacco abuse     Patient Active Problem List   Diagnosis Date Noted  . Cellulitis of right leg 10/30/2019  . Cellulitis of left leg 10/30/2019  . 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 Cellulitis of leg, left 08/08/2019  . CHF exacerbation ([place]) 05/29/2018  . CHF (congestive heart failure) ([place]) 05/27/2018  . Chronic respiratory failure with hypoxia (HCC)/ nocturnal hypoxemia  08/15/2017  . Symptomatic anemia 05/25/2016  . Anemia 05/25/2016  . Hypokalemia 05/25/2016  . Hyponatremia 05/25/2016  . Atrial flutter ([place]), paroxysmal   . Pericardial effusion   . Acute on chronic systolic (congestive) heart failure ([place]) 03/29/2016  . 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 Elevated troponin 03/29/2016  . Restless leg syndrome 03/29/2016  . Presumed cancer of right upper lobe of lung ([place]) 03/29/2016  . Chest pain 03/27/2016  . COPD exacerbation ([place]) 03/27/2016  . Hypertension   . Essential hypertension   . Bradycardia 10/25/2015  . Heart block AV second degree 10/25/2015  . Accelerated hypertension 10/25/2015  . Hemochromatosis 10/25/2015  . Cigarette smoker 10/25/2015  . Alcohol dependence ([place]) 10/25/2015  . Second degree AV block 10/25/2015    Past Surgical History:  Procedure Laterality Date  . BIOPSY  10/13/2016   Procedure: BIOPSY;  Surgeon: Rogene Houston, MD;  [facility];  Service: Endoscopy;;  esophageal  . CARDIAC CATHETERIZATION N/A 03/31/2016   Procedure: Left Heart Cath and Coronary Angiography;  Surgeon: Wellington Hampshire, MD;  Location: [facility];  Service: Cardiovascular;  Laterality: N/A;  . COLONOSCOPY N/A 10/13/2016   Procedure: COLONOSCOPY;  Surgeon: Rogene Houston, MD;  [facility];  Service: Endoscopy;  Laterality: N/A;  . EP IMPLANTABLE DEVICE N/A 10/28/2015   Procedure: Pacemaker Implant;  Surgeon: Evans Lance, MD;  Location: [facility];  Service: Cardiovascular;   Laterality: N/A;  . EP IMPLANTABLE DEVICE N/A 11/07/2015   Procedure: Pocket Revision;  Surgeon: Evans Lance, MD;  Location: [facility];  Service: Cardiovascular;  Laterality: N/A;  . ESOPHAGOGASTRODUODENOSCOPY N/A 05/27/2016   Procedure: ESOPHAGOGASTRODUODENOSCOPY (EGD);  Surgeon: Rogene Houston, MD;  [facility];  Service: Endoscopy;  Laterality: N/A;  . ESOPHAGOGASTRODUODENOSCOPY N/A 10/13/2016   Procedure: ESOPHAGOGASTRODUODENOSCOPY (EGD);  Surgeon: Rogene Houston, MD;  [facility];  Service: Endoscopy;  Laterality: N/A;  1:30 - moved to 11/8 @ 1:00 - Ann notified pt  . FUDUCIAL PLACEMENT Right 12/29/2017   Procedure: PLACEMENT OF FUDUCIAL;  Surgeon: [name], MD;  Location: [facility];  Service: Thoracic;  Laterality: Right;  . POLYPECTOMY  10/13/2016   Procedure: POLYPECTOMY;  Surgeon: Rogene Houston, MD;  [facility];  Service: Endoscopy;;  transverse colon polypectomy  . TONSILLECTOMY    . VIDEO BRONCHOSCOPY WITH ENDOBRONCHIAL NAVIGATION Right 12/29/2017   Procedure: VIDEO BRONCHOSCOPY WITH ENDOBRONCHIAL NAVIGATION;  Surgeon: [name], MD;  Location: [facility];  Service: Thoracic;  Laterality: Right;        Home Medications    Prior to Admission medications   Medication Sig Start Date End Date Taking? Authorizing Provider  atorvastatin (LIPITOR) 40 MG tablet Take 40 mg by mouth every evening.    Yes [provider]  carvedilol (COREG) 12.5 MG tablet Take 1 tablet (12.5 mg total) by mouth 2 (two) times daily with a meal. 10/05/19  Yes Herminio Commons, MD  Dextromethorphan-Guaifenesin (MUCINEX DM MAXIMUM STRENGTH) 60-1200 MG TB12 Take 1 tablet by mouth daily.   Yes [provider]  Fluticasone-Umeclidin-Vilant (TRELEGY ELLIPTA) 100-62.5-25 MCG/INH AEPB Inhale 1 puff into the lungs daily. 06/22/18  Yes Tanda Rockers, MD  ibuprofen (ADVIL,MOTRIN) 200 MG tablet Take 200 mg by mouth every 6 (six) hours  as needed for mild pain or moderate pain.    Yes [provider]  LORazepam (ATIVAN) 2 MG tablet Take 2 mg by mouth 2 (two) times daily.  10/01/15  Yes [provider]  Magnesium 250 MG TABS Take 500 mg by mouth daily.   Yes [provider]  oxyCODONE-acetaminophen (PERCOCET/ROXICET) 5-325 MG tablet Take 1 tablet by mouth 4 (four) times daily.    Yes [provider]  OXYGEN Inhale 2 L into the lungs at bedtime. **2L with sleep and exertion if needed   Yes [provider]  pantoprazole (PROTONIX) 40 MG tablet Take 40 mg by mouth daily.   Yes [provider]  rOPINIRole (REQUIP) 0.5 MG tablet Take 1 mg by mouth at bedtime.  02/25/16  Yes [provider]  sodium chloride (OCEAN) 0.65 % SOLN nasal spray Place 1 spray into both nostrils as needed for congestion.   Yes [provider]  sulfamethoxazole-trimethoprim (BACTRIM DS) 800-160 MG tablet Take 1 tablet by mouth 2 (two) times daily. [place] ON 10/25/2019 10/25/19  Yes [provider]  torsemide (DEMADEX) 20 MG tablet Take 2 tablets (40 mg total) by mouth daily. Patient taking differently: Take 20 mg by mouth 3 (three) times daily.  09/06/19 12/05/19 Yes Strader, Fransisco Hertz, PA-C  albuterol (PROVENTIL HFA;VENTOLIN HFA) 108 (90 Base) MCG/ACT inhaler Inhale 1-2 puffs into the lungs every 6 (six) hours as needed for wheezing or shortness of breath. 08/01/17   Fredia Sorrow, MD  loperamide (IMODIUM) 2 MG capsule Take 1 capsule (2 mg total) by mouth daily as needed for diarrhea or loose stools. 03/27/19   Rogene Houston, MD    Family History Family History  Problem Relation Age of Onset  . Diabetes Other   . Dementia Mother   . Diabetes Maternal Grandmother     Social History Social History   Tobacco Use  . Smoking status: Current Every Day Smoker    Packs/day: 0.25    Years: 56.00    Pack years: 14.00    Types: E-cigarettes, Cigarettes  .  Smokeless tobacco: Never Used  Substance Use Topics  . Alcohol use: Yes    Alcohol/week: 3.0 standard drinks    Types: 3 Shots of liquor per week    Comment: nightly  . Drug use: Never     Allergies   Xarelto [rivaroxaban] and Eliquis [apixaban]   Review of Systems Review of Systems  Constitutional: Negative for fever.  Respiratory: Positive for shortness of breath and wheezing. Negative for cough.   Cardiovascular: Positive for leg swelling. Negative for chest pain.  Gastrointestinal: Negative for abdominal pain, nausea and vomiting.  Genitourinary: Negative for dysuria and hematuria.  Skin: Positive for color change.  Neurological: Negative for headaches.  All other systems reviewed and are negative.    Physical Exam Updated Vital Signs BP 106/70   Pulse (!) 59   Temp 98 F (36.7 C) (Oral)   Resp (!) 22   Ht 5\' 10"  (1.778 m)   Wt 90.7 kg   SpO2 (!) 83%   BMI 28.70 kg/m   Physical Exam Vitals signs and nursing note reviewed.  Constitutional:      Appearance: Normal appearance.     Comments: Ill-appearing  HENT:     Head: Normocephalic and atraumatic.  Eyes:     General: Lids are normal.     Conjunctiva/sclera: Conjunctivae normal.     Pupils: Pupils are equal, round, and reactive to light.  Neck:     Musculoskeletal: Full passive range of motion without pain.  Cardiovascular:     Rate and Rhythm: Normal rate and regular rhythm.     Pulses:          Radial pulses are 2+ on the right side and 2+ on the left side.       Dorsalis pedis pulses are detected w/ Doppler on the right side.     Heart sounds: Normal heart sounds. No murmur. No friction rub. No gallop.   Pulmonary:     Effort: Pulmonary effort is normal. Tachypnea present.     Breath sounds: Wheezing present.     Comments: Increased work of breathing noted.  Wheezing noted throughout all lung fields. Abdominal:     Palpations: Abdomen is soft. Abdomen is not rigid.     Tenderness: There is no  abdominal tenderness. There is no guarding.  Comments: Abdomen is soft, non-distended, non-tender. No rigidity, No guarding. No peritoneal signs.  Musculoskeletal: Normal range of motion.     Comments: Right lower extremity is erythematous and has a purplish tent from the foot that extends all the way up to just about the knee.  He has some brawny induration noted diffusely up to the thigh.  There is about a 4 x 5 cm anterior aspect of where the skin has sloughed off that is a superficial wound.  Skin:    General: Skin is cool and dry.     Comments: Decreased cap refill noted to bilateral upper extremities.  Bilateral hands are pale and cool to touch but there is a palpable pulse.  Neurological:     Mental Status: He is alert and oriented to person, place, and time.  Psychiatric:        Speech: Speech normal.      ED Treatments / Results  Labs (all labs ordered are listed, but only abnormal results are displayed) Labs Reviewed  COMPREHENSIVE METABOLIC PANEL - Abnormal; Notable for the following components:      Result Value   Sodium 132 (*)    Potassium 3.1 (*)    Chloride 87 (*)    BUN 62 (*)    Creatinine, Ser 1.95 (*)    Calcium 7.0 (*)    Albumin 3.1 (*)    Total Bilirubin 2.0 (*)    GFR calc non Af Amer 35 (*)    GFR calc Af Amer 40 (*)    All other components within normal limits  CBC WITH DIFFERENTIAL/PLATELET - Abnormal; Notable for the following components:   RBC 3.56 (*)    Hemoglobin 10.5 (*)    HCT 35.2 (*)    MCHC 29.8 (*)    RDW 16.8 (*)    Platelets 132 (*)    Neutro Abs 8.7 (*)    Lymphs Abs 0.2 (*)    All other components within normal limits  PROTIME-INR - Abnormal; Notable for the following components:   Prothrombin Time 17.0 (*)    INR 1.4 (*)    All other components within normal limits  CULTURE, BLOOD (SINGLE)  BASIC METABOLIC PANEL  POC SARS CORONAVIRUS 2 AG -  ED    EKG EKG Interpretation  Date/Time:  Tuesday October 30 2019 14:44:24  EST Ventricular Rate:  63 PR Interval:    QRS Duration: 177 QT Interval:  518 QTC Calculation: 531 R Axis:   -112 Text Interpretation: Junctional rhythm Nonspecific IVCD with LAD Inferior infarct, old Anterolateral infarct, age indeterminate Baseline wander in lead(s) V2 Confirmed by Nat Christen [phone]) on 10/30/2019 2:52:46 PM   Radiology Dg Chest 2 View  Result Date: 10/30/2019 CLINICAL DATA:  Lower extremity redness, swelling and increased fluid retention, more confusion evening, multiple falls this past weekend, laid on floor for hours, history smoking, pacemaker, atrial fibrillation, COPD, chronic kidney disease, hypertension, CHF, RIGHT upper lobe lung cancer EXAM: CHEST - 2 VIEW COMPARISON:  08/10/2019 Correlation: CT angio chest 05/17/2018 FINDINGS: LEFT subclavian sequential transvenous pacemaker leads project at RIGHT atrium and RIGHT ventricle. Normal heart size, mediastinal contours, and pulmonary vascularity. Atherosclerotic calcification aorta. Scarring in RIGHT upper lobe with associated stellate opacity, question post radiation therapy change; this has increased since the previous study. Atelectasis versus consolidation LEFT lower lobe. Remaining lungs clear. Small LEFT and tiny RIGHT pleural effusions. No pneumothorax or acute osseous findings. IMPRESSION: Atelectasis versus consolidation LEFT lower lobe with associated LEFT pleural effusion.  Increased delayed opacity in the RIGHT upper lobe question progressive post radiation therapy changes. Tiny RIGHT pleural effusion. Aortic Atherosclerosis (ICD10-I70.0). Electronically Signed   By: Lavonia Dana M.D.   On: 10/30/2019 14:58   US Venous Img Lower Unilateral Left (dvt)  Result Date: 10/30/2019 CLINICAL DATA:  68 year old male with left lower extremity redness and swelling for 1 week EXAM: LEFT LOWER EXTREMITY VENOUS DOPPLER ULTRASOUND TECHNIQUE: Gray-scale sonography with graded compression, as well as color Doppler and duplex  ultrasound were performed to evaluate the lower extremity deep venous systems from the level of the common femoral vein and including the common femoral, femoral, profunda femoral, popliteal and calf veins including the posterior tibial, peroneal and gastrocnemius veins when visible. The superficial great saphenous vein was also interrogated. Spectral Doppler was utilized to evaluate flow at rest and with distal augmentation maneuvers in the common femoral, femoral and popliteal veins. COMPARISON:  None. FINDINGS: Contralateral Common Femoral Vein: Respiratory phasicity is normal and symmetric with the symptomatic side. No evidence of thrombus. Normal compressibility. Common Femoral Vein: No evidence of thrombus. Normal compressibility, respiratory phasicity and response to augmentation. Saphenofemoral Junction: No evidence of thrombus. Normal compressibility and flow on color Doppler imaging. Profunda Femoral Vein: No evidence of thrombus. Normal compressibility and flow on color Doppler imaging. Femoral Vein: No evidence of thrombus. Normal compressibility, respiratory phasicity and response to augmentation. Popliteal Vein: No evidence of thrombus. Normal compressibility, respiratory phasicity and response to augmentation. Calf Veins: No evidence of thrombus. Normal compressibility and flow on color Doppler imaging. Superficial Great Saphenous Vein: No evidence of thrombus. Normal compressibility. Venous Reflux:  None. Other Findings:  None. IMPRESSION: No evidence of deep venous thrombosis. Electronically Signed   By: Jacqulynn Cadet M.D.   On: 10/30/2019 14:45    Procedures Procedures (including critical care time)  Medications Ordered in ED Medications  aerochamber Z-Stat Plus/medium (has no administration in time range)  ibuprofen (ADVIL) tablet 200 mg (has no administration in time range)  oxyCODONE-acetaminophen (PERCOCET/ROXICET) 5-325 MG per tablet 1 tablet (has no administration in time range)   atorvastatin (LIPITOR) tablet 40 mg (has no administration in time range)  carvedilol (COREG) tablet 12.5 mg (has no administration in time range)  torsemide (DEMADEX) tablet 20 mg (has no administration in time range)  LORazepam (ATIVAN) tablet 2 mg (has no administration in time range)  pantoprazole (PROTONIX) EC tablet 40 mg (has no administration in time range)  rOPINIRole (REQUIP) tablet 1 mg (has no administration in time range)  Magnesium TABS 500 mg (has no administration in time range)  albuterol (PROVENTIL) (2.5 MG/3ML) 0.083% nebulizer solution 3 mL (has no administration in time range)  dextromethorphan-guaiFENesin (MUCINEX DM) 30-600 MG per 12 hr tablet 1 tablet (has no administration in time range)  Fluticasone-Umeclidin-Vilant 100-62.5-25 MCG/INH AEPB 1 puff (has no administration in time range)  enoxaparin (LOVENOX) injection 40 mg (has no administration in time range)  sodium chloride flush (NS) 0.9 % injection 3 mL (has no administration in time range)  sodium chloride flush (NS) 0.9 % injection 3 mL (has no administration in time range)  0.9 %  sodium chloride infusion (has no administration in time range)  acetaminophen (TYLENOL) tablet 650 mg (has no administration in time range)    Or  acetaminophen (TYLENOL) suppository 650 mg (has no administration in time range)  docusate sodium (COLACE) capsule 100 mg (has no administration in time range)  methylPREDNISolone sodium succinate (SOLU-MEDROL) 125 mg/2 mL injection 125 mg (125 mg Intravenous  Given 10/30/19 1529)  albuterol (VENTOLIN HFA) 108 (90 Base) MCG/ACT inhaler 4 puff (4 puffs Inhalation Given 10/30/19 1702)  fentaNYL (SUBLIMAZE) injection 50 mcg (50 mcg Intravenous Given 10/30/19 1525)  ondansetron (ZOFRAN) injection 4 mg (4 mg Intravenous Given 10/30/19 1528)  sodium chloride 0.9 % bolus 1,000 mL (1,000 mLs Intravenous New Bag/Given 10/30/19 1655)     Initial Impression / Assessment and Plan / ED Course  I  have reviewed the triage vital signs and the nursing notes.  Pertinent labs & imaging results that were available during my care of the patient were reviewed by me and considered in my medical decision making (see chart for details).        68 year old male who is a poor historian brought in today by son for worsening redness and weeping noted to bilateral lower extremities, left greater than right.  He states that over about the last week, the left lower leg has been getting more red and has been having some wounds.  He has a girlfriend who does health at home and has been managing wounds.  Today, noticed some serosanguineous drainage from the wounds, prompting ED visit.  Patient is with history of COPD is on 2 L at home.  He does have some wheezing and cough but does not think he has any trouble breathing.  Patient is very poor historian.  Initially arrival, he was 84%.  He was placed on 2 L O2 and has been improved.  He has diffuse wheezing noted throughout all lung fields.  His legs appear to have some delayed cap refill but has good Doppler pulses noted bilaterally.  Right lower extremity is erythematous with some overlying ecchymosis and what appears to be some chronic vascular changes.  Question of this is related to chronic venous stasis.  I doubt infectious etiology such as cellulitis given that it is not warm.  Also concern for DVT given that it is slightly swollen and more erythematous than the left 1.  Will plan for labs, ultrasound.  CMP shows sodium 132, potassium 3.1.  BUN is 62, creatinine of 1.95.  His INR is 1.4.  He is Covid negative.  CBC shows white blood cell count of 9.2, hemoglobin of 10.5.  Ultrasound shows no evidence of DVT.  Chest x-ray shows atelectasis versus consolidation left lower lobe with associated left pleural effusion.  He has a tiny right pleural effusion.  At this time, I feel the patient would best be served by admission for continued wound care, vascular consult  for bilateral lower extremities.  Additionally, question if he needs some home health services.  Discussed with hospitalist.  He accepts patient for admission.  Portions of this note were generated with [employer]. Dictation errors may occur despite best attempts at proofreading.    Final Clinical Impressions(s) / ED Diagnoses   Final diagnoses:  Leg pain, bilateral  Chronic obstructive pulmonary disease, unspecified COPD type [facility])    ED Discharge Orders    None       Desma Mcgregor 10/30/19 2212    Nat Christen, MD 11/04/19 2032

## 2019-10-30 NOTE — H&P (Signed)
History and Physical    Shaun May TGP:[id number] DOB: May 07, 1951 DOA: 10/30/2019  PCP: Celene Squibb, MD (Confirm with patient/family/NH records and if not entered, this has to be entered at [facility] point of entry) Patient coming from: Patient is coming from home   I have personally briefly reviewed patient's old medical records in [place]  Chief Complaint: Red painful left leg  HPI: Shaun May is a 68 y.o. 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  Chronic venous insufficiency, history of left leg cellulitis, history of cirrhosis with ascites status post paracentesis in the last month.  Patient lives alone with an intermittent visitation by a girlfriend.  According to the patient's son he is irregular in taking his medications as instructed.  He has been failing to thrive.  The patient's son found him today to be more confused than usual and noted that his left leg was very erythematous with serosanguineous drainage.  Son called EMS to bring the patient to the [facility] emergency department for evaluation.  ED Course: Patient was hemodynamically stable in the emergency department.  Routine laboratory was significant for potassium slightly low at 3.1, reacting 1.95.  1.9 on October 30, 0.15 September 14.  Hemoglobin low at 10.5 INR elevated at 1.4 Covid antigen testing negative.  His examination did revealed an erythematous left leg with serosanguineous drainage from open blisters.  Patient was referred to [facility] for admission continued medical management  Review of Systems: As per HPI otherwise 10 point review of systems negative.    Past Medical History:  Diagnosis Date   Alcohol use    Anemia    when on Xarelto   Cancer [facility])    Chronic combined systolic and diastolic CHF (congestive heart failure) (HCC)    a. EF  35-40% by echo in 03/2016 with cath showing nonobstructive CAD b. EF improved to 60-65% by echo in 05/2018)   CKD (chronic kidney disease), stage II    COPD (chronic obstructive pulmonary disease) (HCC)    stage 4 COPD   Dyspnea    Gastric ulcer    GI bleeding    Hematuria    Hemochromatosis    Hypertension    Lung nodule    Mobitz type 2 second degree heart block    PPM MDT 10/28/15 Dr. Lovena Le   NICM (nonischemic cardiomyopathy) [facility])    Paroxysmal atrial flutter (HCC)    Pericardial effusion    Persistent atrial fibrillation ([place])    Presence of permanent cardiac pacemaker    [employer]   Tobacco abuse     Past Surgical History:  Procedure Laterality Date   BIOPSY  10/13/2016   Procedure: BIOPSY;  Surgeon: Rogene Houston, MD;  [facility];  Service: Endoscopy;;  esophageal   CARDIAC CATHETERIZATION N/A 03/31/2016   Procedure: Left Heart Cath and Coronary Angiography;  Surgeon: Wellington Hampshire, MD;  Location: [place] CV LAB;  Service: Cardiovascular;  Laterality: N/A;   COLONOSCOPY N/A 10/13/2016   Procedure: COLONOSCOPY;  Surgeon: Rogene Houston, MD;  Location: [facility];  Service: Endoscopy;  Laterality: N/A;   EP IMPLANTABLE DEVICE N/A 10/28/2015   Procedure: Pacemaker Implant;  Surgeon: Evans Lance, MD;  Location: [place] CV LAB;  Service: Cardiovascular;  Laterality: N/A;   EP IMPLANTABLE DEVICE N/A 11/07/2015   Procedure: Pocket Revision;  Surgeon: Evans Lance, MD;  Location: [place] CV LAB;  Service: Cardiovascular;  Laterality: N/A;   ESOPHAGOGASTRODUODENOSCOPY N/A 05/27/2016   Procedure: ESOPHAGOGASTRODUODENOSCOPY (EGD);  Surgeon: Rogene Houston, MD;  [facility];  Service: Endoscopy;  Laterality: N/A;   ESOPHAGOGASTRODUODENOSCOPY N/A 10/13/2016   Procedure: ESOPHAGOGASTRODUODENOSCOPY (EGD);  Surgeon: Rogene Houston, MD;  [facility];  Service: Endoscopy;  Laterality: N/A;  1:30 - moved to  11/8 @ 1:00 - Ann notified pt   FUDUCIAL PLACEMENT Right 12/29/2017   Procedure: PLACEMENT OF FUDUCIAL;  Surgeon: [name], MD;  Location: [place];  Service: Thoracic;  Laterality: Right;   POLYPECTOMY  10/13/2016   Procedure: POLYPECTOMY;  Surgeon: Rogene Houston, MD;  [facility];  Service: Endoscopy;;  transverse colon polypectomy   TONSILLECTOMY     VIDEO BRONCHOSCOPY WITH ENDOBRONCHIAL NAVIGATION Right 12/29/2017   Procedure: VIDEO BRONCHOSCOPY WITH ENDOBRONCHIAL NAVIGATION;  Surgeon: [name], MD;  Location: [place];  Service: Thoracic;  Laterality: Right;  Social history -patient is on disability.  He lives alone.  His son is frequently looking in on him.  There is some question of his ability to live independently.  Patient continues to smoke and drink.  Discussed advance care planning with the patient and his son being present.  Patient had not previously given this consideration.  Reviewed his statistical probability of successful resuscitation and ability to survive.  Referred the patient and his son to "the conversation [email or web address]   reports that he has been smoking e-cigarettes and cigarettes. He has a 14.00 pack-year smoking history. He has never used smokeless tobacco. He reports current alcohol use of about 3.0 standard drinks of alcohol per week. He reports that he does not use drugs.  Allergies  Allergen Reactions   Xarelto [Rivaroxaban] Other (See Comments)    Caused bleeding.    Eliquis [Apixaban]     Internal bleeding    Family History  Problem Relation Age of Onset   Diabetes Other    Dementia Mother    Diabetes Maternal Grandmother    Unacceptable: Noncontributory, unremarkable, or negative. Acceptable: Family history reviewed and not pertinent (If you reviewed it)  Prior to Admission medications   Medication Sig Start Date End Date Taking? Authorizing Provider  atorvastatin (LIPITOR) 40 MG tablet Take 40 mg by mouth  every evening.    Yes [provider]  carvedilol (COREG) 12.5 MG tablet Take 1 tablet (12.5 mg total) by mouth 2 (two) times daily with a meal. 10/05/19  Yes Herminio Commons, MD  Dextromethorphan-Guaifenesin (MUCINEX DM MAXIMUM STRENGTH) 60-1200 MG TB12 Take 1 tablet by mouth daily.   Yes [provider]  Fluticasone-Umeclidin-Vilant (TRELEGY ELLIPTA) 100-62.5-25 MCG/INH AEPB Inhale 1 puff into the lungs daily. 06/22/18  Yes Tanda Rockers, MD  ibuprofen (ADVIL,MOTRIN) 200 MG tablet Take 200 mg by mouth every 6 (six) hours as needed for mild pain or moderate pain.    Yes [provider]  LORazepam (ATIVAN) 2 MG tablet Take 2 mg by mouth 2 (two) times daily.  10/01/15  Yes [provider]  Magnesium 250 MG TABS Take 500 mg by mouth daily.   Yes [provider]  oxyCODONE-acetaminophen (PERCOCET/ROXICET) 5-325 MG tablet Take 1 tablet by mouth 4 (four) times daily.    Yes [provider]  OXYGEN Inhale 2 L into the lungs at bedtime. **2L with sleep and exertion if needed   Yes [provider]  pantoprazole (PROTONIX) 40 MG tablet Take 40 mg by mouth daily.   Yes [provider]  rOPINIRole (REQUIP) 0.5 MG tablet Take 1 mg by mouth at bedtime.  02/25/16  Yes [provider]  sodium chloride (OCEAN) 0.65 % SOLN nasal spray Place 1 spray into both nostrils as needed for congestion.   Yes [provider]  sulfamethoxazole-trimethoprim (BACTRIM DS) 800-160 MG tablet Take 1 tablet by mouth 2 (two) times daily. Biron ON 10/25/2019 10/25/19  Yes [provider]  torsemide (DEMADEX) 20 MG tablet Take 2 tablets (40 mg total) by mouth daily. Patient taking differently: Take 20 mg by mouth 3 (three) times daily.  09/06/19 12/05/19 Yes Strader, Fransisco Hertz, PA-C  albuterol (PROVENTIL HFA;VENTOLIN HFA) 108 (90 Base) MCG/ACT inhaler Inhale 1-2 puffs into the lungs every 6 (six) hours as needed for  wheezing or shortness of breath. 08/01/17   Fredia Sorrow, MD  loperamide (IMODIUM) 2 MG capsule Take 1 capsule (2 mg total) by mouth daily as needed for diarrhea or loose stools. 03/27/19   Rogene Houston, MD    Physical Exam: Vitals:   10/30/19 1700 10/30/19 1730 10/30/19 1800 10/30/19 1830  BP: 99/72 114/69 106/71 106/70  Pulse: (!) 59 65 (!) 55 (!) 59  Resp: (!) 22     Temp:      TempSrc:      SpO2: 97% (!) 84% (!) 78% (!) 83%  Weight:      Height:        Constitutional: NAD, calm, comfortable Vitals:   10/30/19 1700 10/30/19 1730 10/30/19 1800 10/30/19 1830  BP: 99/72 114/69 106/71 106/70  Pulse: (!) 59 65 (!) 55 (!) 59  Resp: (!) 22     Temp:      TempSrc:      SpO2: 97% (!) 84% (!) 78% (!) 83%  Weight:      Height:       General appearance:  -Haggard chronically ill-appearing gentleman who is in no distress  eyes: Exophthalmos is noted PERRL, lids and conjunctivae normal ENMT: Mucous membranes are moist. Posterior pharynx clear of any exudate or lesions.Normal dentition.  Neck: normal, supple, no masses, no thyromegaly Respiratory: Decreased breath sounds with poor air movement.  Basilar crackles are noted.  No wheezing.  No use of accessory musculature abdominal musculature for respiratory support  cardiovascular: Regular rate and rhythm, no murmurs / rubs / gallops.  Extremities with the chronic venous insufficiency.  Dorsalis pedis pulse.  No carotid bruits.  Abdomen: no tenderness, no masses palpated. No hepatosplenomegaly. Bowel sounds positive but hypoactive.  There was no fluid wave or shifting dullness to percussion.  The abdomen does appear distended. Musculoskeletal: no clubbing / cyanosis. No joint deformity upper and lower extremities. Good ROM, no contractures.  Decreased muscle tone.  Skin: Multiple bruises from venous capillary fragility.  Distal left lower extremity with several areas of facial blistering.  The leg from the dorsum of the foot to just  below the knee is significantly erythematous and warm to touch.  There is a zinc residue from what appears to have been an Haematologist that was previously in place Neurologic: CN 2-12 grossly intact. Marland Kitchen  Psychiatric: Poor judgment and insight. Alert and oriented x 3. Normal mood.   (Anything < 9 systems with 2 bullets each down codes to level 1) (If patient refuses exam cant bill higher level) (Make sure to document decubitus ulcers present on admission -- if possible --  and whether patient has chronic indwelling catheter at time of admission)  Labs on Admission: I have personally reviewed following labs and imaging studies  CBC: Recent Labs  Lab 10/30/19 1302  WBC 9.2  NEUTROABS 8.7*  HGB 10.5*  HCT 35.2*  MCV 98.9  PLT 952*   Basic Metabolic Panel: Recent Labs  Lab 10/30/19 1302  NA 132*  K 3.1*  CL 87*  CO2 32  GLUCOSE 90  BUN 62*  CREATININE 1.95*  CALCIUM 7.0*   GFR: Estimated Creatinine Clearance: 41.6 mL/min (A) (by C-G formula based on SCr of 1.95 mg/dL (H)). Liver Function Tests: Recent Labs  Lab 10/30/19 1302  AST 34  ALT 18  ALKPHOS 86  BILITOT 2.0*  PROT 7.0  ALBUMIN 3.1*   No results for input(s): LIPASE, AMYLASE in the last 168 hours. No results for input(s): AMMONIA in the last 168 hours. Coagulation Profile: Recent Labs  Lab 10/30/19 1302  INR 1.4*   Cardiac Enzymes: No results for input(s): CKTOTAL, CKMB, CKMBINDEX, TROPONINI in the last 168 hours. BNP (last 3 results) No results for input(s): PROBNP in the last 8760 hours. HbA1C: No results for input(s): HGBA1C in the last 72 hours. CBG: No results for input(s): GLUCAP in the last 168 hours. Lipid Profile: No results for input(s): CHOL, HDL, LDLCALC, TRIG, CHOLHDL, LDLDIRECT in the last 72 hours. Thyroid Function Tests: No results for input(s): TSH, T4TOTAL, FREET4, T3FREE, THYROIDAB in the last 72 hours. Anemia Panel: No results for input(s): VITAMINB12, FOLATE, FERRITIN, TIBC,  IRON, RETICCTPCT in the last 72 hours. Urine analysis:    Component Value Date/Time   COLORURINE YELLOW 05/25/2016 2318   APPEARANCEUR CLEAR 05/25/2016 2318   LABSPEC <1.005 (L) 05/25/2016 2318   PHURINE 5.5 05/25/2016 2318   GLUCOSEU NEGATIVE 05/25/2016 2318   HGBUR NEGATIVE 05/25/2016 2318   [place] NEGATIVE 05/25/2016 2318   KETONESUR NEGATIVE 05/25/2016 2318   PROTEINUR NEGATIVE 05/25/2016 2318   NITRITE NEGATIVE 05/25/2016 2318   LEUKOCYTESUR NEGATIVE 05/25/2016 2318    Radiological Exams on Admission: Dg Chest 2 View  Result Date: 10/30/2019 CLINICAL DATA:  Lower extremity redness, swelling and increased fluid retention, more confusion evening, multiple falls this past weekend, laid on floor for hours, history smoking, pacemaker, atrial fibrillation, COPD, chronic kidney disease, hypertension, CHF, RIGHT upper lobe lung cancer EXAM: CHEST - 2 VIEW COMPARISON:  08/10/2019 Correlation: CT angio chest 05/17/2018 FINDINGS: LEFT subclavian sequential transvenous pacemaker leads project at RIGHT atrium and RIGHT ventricle. Normal heart size, mediastinal contours, and pulmonary vascularity. Atherosclerotic calcification aorta. Scarring in RIGHT upper lobe with associated stellate opacity, question post radiation therapy change; this has increased since the previous study. Atelectasis versus consolidation LEFT lower lobe. Remaining lungs clear. Small LEFT and tiny RIGHT pleural effusions. No pneumothorax or acute osseous findings. IMPRESSION: Atelectasis versus consolidation LEFT lower lobe with associated LEFT pleural effusion. Increased delayed opacity in the RIGHT upper lobe question progressive post radiation therapy changes. Tiny RIGHT pleural effusion. Aortic Atherosclerosis (ICD10-I70.0). Electronically Signed   By: Lavonia Dana M.D.   On: 10/30/2019 14:58   US Venous Img Lower Unilateral Left (dvt)  Result Date: 10/30/2019 CLINICAL DATA:  68 year old male with left lower extremity  redness and swelling for 1 week EXAM: LEFT LOWER EXTREMITY VENOUS DOPPLER ULTRASOUND TECHNIQUE: Gray-scale sonography with graded compression, as well as color Doppler and duplex ultrasound were performed to evaluate the lower extremity deep venous systems from the level of the common femoral vein and including the common  femoral, femoral, profunda femoral, popliteal and calf veins including the posterior tibial, peroneal and gastrocnemius veins when visible. The superficial great saphenous vein was also interrogated. Spectral Doppler was utilized to evaluate flow at rest and with distal augmentation maneuvers in the common femoral, femoral and popliteal veins. COMPARISON:  None. FINDINGS: Contralateral Common Femoral Vein: Respiratory phasicity is normal and symmetric with the symptomatic side. No evidence of thrombus. Normal compressibility. Common Femoral Vein: No evidence of thrombus. Normal compressibility, respiratory phasicity and response to augmentation. Saphenofemoral Junction: No evidence of thrombus. Normal compressibility and flow on color Doppler imaging. Profunda Femoral Vein: No evidence of thrombus. Normal compressibility and flow on color Doppler imaging. Femoral Vein: No evidence of thrombus. Normal compressibility, respiratory phasicity and response to augmentation. Popliteal Vein: No evidence of thrombus. Normal compressibility, respiratory phasicity and response to augmentation. Calf Veins: No evidence of thrombus. Normal compressibility and flow on color Doppler imaging. Superficial Great Saphenous Vein: No evidence of thrombus. Normal compressibility. Venous Reflux:  None. Other Findings:  None. IMPRESSION: No evidence of deep venous thrombosis. Electronically Signed   By: Jacqulynn Cadet M.D.   On: 10/30/2019 14:45    EKG: Independently reviewed.  EKG with a external rhythm.  Unspecific IVCD with LAD, old inferior infarct.  Echocardiogram from September 21, 2019 with an EF of 60 to 65%,  LVH, LV diastolic dysfunction  Assessment/Plan Active Problems:   Hypertension   COPD GOLD IV/ 02 dep at hs/ still smoking    Cirrhosis of liver with ascites (HCC)   Cellulitis of left leg  (please populate well all problems here in Problem List. (For example, if patient is on BP meds at home and you resume or decide to hold them, it is a problem that needs to be her. Same for CAD, COPD, HLD and so on)   1.  Cellulitis left leg -recurrent problem for this patient.  Left leg is warm erythematous with multiple areas of serosanguineous drainage. Plan medical admission  IV ceftriaxone until improvement and then convert to oral regimen  Wound care consult, a benefit from [facility] boot  2.  COPD Gold 4 -patient is in no respiratory distress.  He does continue to smoke.  He is oxygen dependent. Plan new his home regimen  3.  Cirrhosis of the liver -this problem appears stable.  No respiratory distress related to ascites.  4.  Hypertension -continue home regimen  5.  Diastolic heart failure -no evidence of decompensation.  Diuretics have resulted in prerenal azotemia this is known to the patient's cardiologist who recently saw the patient and adjusted his medications Plan continue home regimen  6.  Disposition -patient may be unsafe to live independently.  He has a complex medical regimen that he is not adherent to.  He is not capable of managing dressing changes. Plan PT and OT evaluation to determine patient's ability to manage ADLs and live independently  7.  Of life care -initiated discussion with the patient and his son.  Referred them to the conversation [email or web address]  DVT prophylaxis: Lovenox (Lovenox/Heparin/SCD's/anticoagulated/None (if comfort care) Code Status: Full code (Full/Partial (specify details) Family Communication: Son was present during the examination.  Had no additional questions in regards to diagnosis or treatment plan (Specify name, relationship. Do not write "discussed  with patient". Specify tel # if discussed over the phone) Disposition Plan: To be determined (specify when and where you expect patient to be discharged) Consults called: None (with names) Admission status: Inpatient (inpatient / obs / tele /  medical floor / SDU)   Adella Hare MD [facility] Pager [phone]  If 7PM-7AM, please contact night-coverage [email or web address] Password [email or web address]  10/30/2019, 8:30 PM

## 2019-10-31 ENCOUNTER — Other Ambulatory Visit: Payer: Self-pay | Admitting: *Deleted

## 2019-10-31 DIAGNOSIS — F1010 Alcohol abuse, uncomplicated: Secondary | ICD-10-CM | POA: Diagnosis present

## 2019-10-31 DIAGNOSIS — J9601 Acute respiratory failure with hypoxia: Secondary | ICD-10-CM | POA: Diagnosis present

## 2019-10-31 DIAGNOSIS — I5030 Unspecified diastolic (congestive) heart failure: Secondary | ICD-10-CM | POA: Diagnosis present

## 2019-10-31 LAB — BASIC METABOLIC PANEL
Anion gap: 16 — ABNORMAL HIGH (ref 5–15)
BUN: 64 mg/dL — ABNORMAL HIGH (ref 8–23)
CO2: 31 mmol/L (ref 22–32)
Calcium: 7.3 mg/dL — ABNORMAL LOW (ref 8.9–10.3)
Chloride: 87 mmol/L — ABNORMAL LOW (ref 98–111)
Creatinine, Ser: 2.05 mg/dL — ABNORMAL HIGH (ref 0.61–1.24)
GFR calc Af Amer: 38 mL/min — ABNORMAL LOW (ref >60)
GFR calc non Af Amer: 33 mL/min — ABNORMAL LOW (ref >60)
Glucose, Bld: 116 mg/dL — ABNORMAL HIGH (ref 70–99)
Potassium: 3.6 mmol/L (ref 3.5–5.1)
Sodium: 134 mmol/L — ABNORMAL LOW (ref 135–145)

## 2019-10-31 LAB — SARS CORONAVIRUS 2 (TAT 6-24 HRS): SARS Coronavirus 2: NEGATIVE

## 2019-10-31 MED ORDER — FOLIC ACID 1 MG PO TABS: 1.0000 mg | ORAL_TABLET | Freq: Every day | ORAL | Status: DC

## 2019-10-31 MED ORDER — LORAZEPAM 1 MG PO TABS: 1.0000 mg | ORAL_TABLET | ORAL | Status: DC | PRN

## 2019-10-31 MED ORDER — THIAMINE HCL 100 MG/ML IJ SOLN: 100.0000 mg | Freq: Every day | INTRAMUSCULAR | Status: DC

## 2019-10-31 MED ORDER — SODIUM CHLORIDE 0.9 % IV SOLN
1.0000 g | INTRAVENOUS | Status: DC
  Administered 2019-10-31: 11:00:00 1 g via INTRAVENOUS
  Filled 2019-10-31: qty 10

## 2019-10-31 MED ORDER — THIAMINE HCL 100 MG/ML IJ SOLN
Freq: Once | INTRAVENOUS | Status: AC
  Administered 2019-10-31: 23:00:00 via INTRAVENOUS
  Filled 2019-10-31: qty 1000

## 2019-10-31 MED ORDER — ENSURE ENLIVE PO LIQD
237.0000 mL | Freq: Three times a day (TID) | ORAL | Status: DC
  Administered 2019-10-31: 237 mL via ORAL

## 2019-10-31 MED ORDER — VITAMIN B-1 100 MG PO TABS: 100.0000 mg | ORAL_TABLET | Freq: Every day | ORAL | Status: DC

## 2019-10-31 MED ORDER — THIAMINE HCL 100 MG/ML IJ SOLN
INTRAMUSCULAR | Status: AC
  Filled 2019-10-31: qty 2

## 2019-10-31 MED ORDER — M.V.I. ADULT IV INJ
INJECTION | INTRAVENOUS | Status: AC
  Filled 2019-10-31: qty 10

## 2019-10-31 MED ORDER — TORSEMIDE 20 MG PO TABS: 20.0000 mg | ORAL_TABLET | Freq: Every day | ORAL | Status: DC

## 2019-10-31 MED ORDER — ADULT MULTIVITAMIN W/MINERALS CH: 1.0000 | ORAL_TABLET | Freq: Every day | ORAL | Status: DC

## 2019-10-31 MED ORDER — LORAZEPAM 2 MG/ML IJ SOLN
1.0000 mg | INTRAMUSCULAR | Status: DC | PRN
  Administered 2019-11-01: 2 mg via INTRAVENOUS

## 2019-10-31 MED ORDER — LORAZEPAM 2 MG/ML IJ SOLN: 0.0000 mg | Freq: Two times a day (BID) | INTRAMUSCULAR | Status: DC

## 2019-10-31 MED ORDER — LORAZEPAM 2 MG/ML IJ SOLN
0.0000 mg | Freq: Four times a day (QID) | INTRAMUSCULAR | Status: DC
  Filled 2019-10-31: qty 1

## 2019-10-31 MED ORDER — FOLIC ACID 5 MG/ML IJ SOLN
INTRAMUSCULAR | Status: AC
  Filled 2019-10-31: qty 0.2

## 2019-10-31 NOTE — Progress Notes (Signed)
Patient is drowsy this morning. He was sat up in chair by PT. He has taken a few bites of breakfast, stated that he slept great last night and has taken his morning medications (he chews them one by one). I will continue to monitor him.

## 2019-10-31 NOTE — Evaluation (Signed)
Occupational Therapy Evaluation Patient Details Name: Shaun May MRN: [id number] DOB: July 26, 1951 Today's Date: 10/31/2019    History of Present Illness Shaun May is a 68 y.o. 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  Chronic venous insufficiency, history of left leg cellulitis, history of cirrhosis with ascites status post paracentesis in the last month.  Patient lives alone with an intermittent visitation by a girlfriend.  According to the patient's son he is irregular in taking his medications as instructed.  He has been failing to thrive.  The patient's son found him today to be more confused than usual and noted that his left leg was very erythematous with serosanguineous drainage.  Son called EMS to bring the patient to the [facility] emergency department for evaluation.   Clinical Impression   Pt awakens to name being called, will answer a simple question with yes/no and then close eyes or pull covers over head. OT/PT co-evaluation completed this am, pt unable to provide much PLOF information, no family available to assist with accuracy of information that was provided. Pt makes little to no effort to participate in ADLs this am, upon standing has posterior lean limiting ability to complete standing tasks safely. Pt demonstrates generalized weakness and poor activity tolerance for ADL complete and functional mobility tasks. Recommend SNF on discharge to improve pt safety and independence in ADL completion, as well as improve strength required for functional tasks.     Follow Up Recommendations  SNF;Supervision/Assistance - 24 hour    Equipment Recommendations  None recommended by OT       Precautions / Restrictions Precautions Precautions: Fall Restrictions Weight Bearing Restrictions: No      Mobility Bed Mobility                General bed mobility comments: Defer to PT note  Transfers                 General transfer comment: Defer to PT note        ADL either performed or assessed with clinical judgement   ADL Overall ADL's : Needs assistance/impaired Eating/Feeding: Set up;Sitting Eating/Feeding Details (indicate cue type and reason): Pt making no attempt to inspect tray or eat.              Upper Body Dressing : Maximal assistance;Sitting   Lower Body Dressing: Maximal assistance;Sitting/lateral leans Lower Body Dressing Details (indicate cue type and reason): Pt reports he can donn socks, however when given socks asked OT to donn and then reports his girlfriend does that at home.                General ADL Comments: Pt lethargic and makes no attempt to participate in ADLs this am. Upon standing has posterior lean, with RW. Unable to complete ADLs in standing due to pos lean, weakness, and poor body awareness.      Vision Baseline Vision/History: (unsure) Patient Visual Report: No change from baseline Vision Assessment?: No apparent visual deficits            Pertinent Vitals/Pain Pain Assessment: No/denies pain     Hand Dominance Right   Extremity/Trunk Assessment Upper Extremity Assessment Upper Extremity Assessment: Generalized weakness   Lower Extremity Assessment Lower Extremity Assessment: Defer to PT evaluation   Cervical / Trunk Assessment Cervical / Trunk Assessment: Normal   Communication Communication  Communication: No difficulties   Cognition Arousal/Alertness: Lethargic Behavior During Therapy: Flat affect Overall Cognitive Status: Difficult to assess                                                Home Living Family/patient expects to be discharged to:: Private residence Living Arrangements: Spouse/significant other Available Help at Discharge: Other (Comment)(unsure)                         Home  Equipment: Walker - 2 wheels   Additional Comments: Pt is poor historian, unable to provide much PLOF information      Prior Functioning/Environment Level of Independence: Needs assistance  Gait / Transfers Assistance Needed: Pt reports using RW for functional mobility ADL's / Homemaking Assistance Needed: Pt reports girlfriend, Santiago Glad, helps with dressing             OT Problem List: Decreased strength;Decreased activity tolerance;Impaired balance (sitting and/or standing);Decreased cognition;Decreased safety awareness;Decreased knowledge of use of DME or AE                       Co-evaluation PT/OT/SLP Co-Evaluation/Treatment: Yes Reason for Co-Treatment: Complexity of the patient's impairments (multi-system involvement);To address functional/ADL transfers   OT goals addressed during session: ADL's and self-care         End of Session Equipment Utilized During Treatment: Gait belt;Rolling walker;Oxygen Nurse Communication: Mobility status  Activity Tolerance: Patient limited by lethargy Patient left: in chair;with call bell/phone within reach;with chair alarm set  OT Visit Diagnosis: Muscle weakness (generalized) (M62.81)                Time: 7672-0947 OT Time Calculation (min): 22 min Charges:  OT General Charges $OT Visit: 1 Visit OT Evaluation $OT Eval Moderate Complexity: [place], OTR/L  [phone] 10/31/2019, 8:19 AM

## 2019-10-31 NOTE — Patient Outreach (Signed)
[facility]) Care Management  10/31/2019  LEHMAN WHITELEY Dec 28, 1950 [id number]   RN Health Coach Hospitalized  Referral Date:  06/03/2019 Referral Source:  EMMI Alert Screening Reason for Referral:  Disease Management Education Insurance:  Medicare   Outreach Attempt:  Received notification that patient admitted to [facility] for lower extremity cellulitis.  RN Health [facility] Liaison of patients admission.  Plan:  Will await Hospital Liaisons recommendations for possible discharge needs.  [place] Coach [phone] [email or web address]

## 2019-10-31 NOTE — TOC Progression Note (Signed)
[place], [place] [address] (43) [place] My [place] in a new window 5 out of 5 starsfootnote Much Above Average 5 out of 5 starsfootnote Much Above Average 3 out of 5 starsfootnote Average 3 out of 5 starsfootnote Average 2.9 [place], [place] [address] [phone]   [place] My Favorites- [place] in a new window 2 out of 5 starsfootnote Below Average 2 out of 5 starsfootnote Below Average 2 out of 5 starsfootnote Below Average 2 out of 5 starsfootnote Below Average 3.0 [place] [address] [place], [place] [address] [phone]   Add [facility] My Favorites- [place] in a new window 4 out of 5 starsfootnote Above Average 4 out of 5 starsfootnote Above Average 3 out of 5 starsfootnote Average 4 out of 5 starsfootnote Above Average 10.7 [place], [place] [address] [phone]   [place] [facility] My Favorites- Opens in a new window 3 out of 5 starsfootnote Average 3 out of 5 starsfootnote Average Not Available57footnote 3 out of 5 starsfootnote Average 12.5 [place], [place] [address] [phone]   Add [facility] My Favorites- Op[place] in a new window 1 out of 5 starsfootnote Much Below Average 2 out of 5 starsfootnote Below Average 1 out of 5 starsfootnote Much Below Average 3 out of 5 starsfootnote Average 14.7 [place] [address] [place], [place] [address] [phone]   Add [facility] My Favorites- Opens in a new window 4 out of 5 starsfootnote Above Average 3 out of 5 starsfootnote Average 2 out of 5 starsfootnote Below  Average 5 out of 5 starsfootnote Much Above Average 22.5 [place] [facility] [address] [place], [place] [address] [phone]   Add [place] My Favorites- Opens in a new window 1 out of 5 starsfootnote Much Below Average 1 out of 5 starsfootnote Much Below Average 2 out of 5 starsfootnote Below Average 2 out of 5 starsfootnote Below Average 24.2 [place], [place] [address] [phone]   [place] [facility] My Favorites- [place] in a new window 3 out of 5 starsfootnote Average 3 out of 5 starsfootnote Average 2 out of 5 starsfootnote Below Average 2 out of 5 starsfootnote Below Average 24.4 [place] [address] [place], [place] [address] [phone]   [place] My Favorites- Opens in a new window 1 out of 5 starsfootnote Much Below Average 1 out of 5 starsfootnote Much Below Average 2 out of 5 starsfootnote Below Average 2 out of 5 starsfootnote Below Average 25.5 [place] [address] [place], [place] [address] [phone]   [place] My Favorites- Opens in a new window 1 out of 5 starsfootnote Much Below Average 2 out of 5 starsfootnote Below Average 1 out of 5 starsfootnote Much Below Average 3 out of 5 starsfootnote Average 25.9 [place] [address] [place],  [address] [phone]   [place] My Favorites- [place] in a new  window 5 out of 5 starsfootnote Much Above Average 5 out of 5 starsfootnote Much Above Average Not Available35footnote 2 out of 5 starsfootnote Below Average 26.1 [facility] [address] [place] [address] [phone]   Add  [facility] My Favorites- [place] in a new window 3 out of 5 starsfootnote Average 3 out of 5 starsfootnote Average 2 out of 5 starsfootnote Below Average 3 out of 5 starsfootnote Average 26.7 [place] [address] [place] [address] [phone]   [place] [facility] My Favorites- Opens in a new window 3 out of 5 starsfootnote Average 3 out of 5 starsfootnote Average 2 out of 5 starsfootnote Below Average 4 out of 5 starsfootnote Above Average 27.0 [place] [facility] [place], [place] [address] [phone]) [place] [facility] My Favorites- [place] in a new window 2 out of 5 starsfootnote Below Average 2 out of 5 starsfootnote Below Average 2 out of 5 starsfootnote Below Average 2 out of 5 starsfootnote Below Average 27.0 [facility] [address] [place] [address] [phone]   [place] My Favorites- Opens in a new window 1 out of 5 starsfootnote Much Below Average 1 out of 5 starsfootnote Much Below Average 3 out of 5 starsfootnote Average 1 out of 5 starsfootnote Much Below Average 27.1 [place] [address] (434) [place] My Favorites- Op[place] in a new window 3 out of 5 starsfootnote Average 3 out of 5 starsfootnote Average 3 out of 5 starsfootnote Average 4 out of 5 starsfootnote Above Average 27.8 [facility] [place], [place] [address] [phone]   Add [facility] My [place] in a new window 5 out of 5 starsfootnote Much Above Average 5 out of 5 starsfootnote Much Above Average Not Available68footnote 5 out of 5 starsfootnote Much Above  Average 28.0 [place], [place] [address] [phone]   Add [place] My Favorites- Opens in a new window 5 out of 5 starsfootnote Much Above Average 4 out of 5 starsfootnote Above Average Not Available15footnote 5 out of 5 starsfootnote Much Above Average 28.1 [place], [place] [address] [phone]   [place] My Favorites- Opens in a new window 5 out of 5 starsfootnote Much Above Average 4 out of 5 starsfootnote Above Average 5 out of 5 starsfootnote Much Above Average 5 out of 5 starsfootnote Much Above Average 28.2 [place], [place] [address] [phone]   Add [place] My Favorites- Opens in a new window 5 out of 5 starsfootnote Much Above Average 4 out of 5 starsfootnote Above Average 5 out of 5 starsfootnote Much Above Average 5 out of 5 starsfootnote Much Above Average 28.3 [place]

## 2019-10-31 NOTE — Evaluation (Signed)
Physical Therapy Evaluation Patient Details Name: Shaun May MRN: [id number] DOB: [birth year] Today's Date: 10/31/2019   History of Present Illness  Shaun May is a 68 y.o. 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  Chronic venous insufficiency, history of left leg cellulitis, history of cirrhosis with ascites status post paracentesis in the last month.  Patient lives alone with an intermittent visitation by a girlfriend.  According to the patient's son he is irregular in taking his medications as instructed.  He has been failing to thrive.  The patient's son found him today to be more confused than usual and noted that his left leg was very erythematous with serosanguineous drainage.  Son called EMS to bring the patient to the [facility] emergency department for evaluation.    Clinical Impression  Patient presents slightly lethargic, became alert after repeated verbal/tactile cueing, demonstrates slow labored movement for sitting up at bedside and limited to a few side steps at bedside due to generalized weakness, poor standing balance, fall risk and lethargy.  Patient tolerated sitting up in chair after therapy - RN present in room.  Patient will benefit from continued physical therapy in hospital and recommended venue below to increase strength, balance, endurance for safe ADLs and gait.    Follow Up Recommendations SNF;Supervision for mobility/OOB;Supervision - Intermittent    Equipment Recommendations  None recommended by PT    Recommendations for Other Services       Precautions / Restrictions Precautions Precautions: Fall Restrictions Weight Bearing Restrictions: No      Mobility  Bed Mobility Overal bed mobility: Needs Assistance Bed Mobility: Supine to Sit     Supine to sit: Mod assist     General bed  mobility comments: slow labored movement  Transfers Overall transfer level: Needs assistance Equipment used: Rolling walker (2 wheeled) Transfers: Sit to/from [facility] Sit to Stand: Min assist;Mod assist Stand pivot transfers: Mod assist       General transfer comment: slow labored movement, requires frequent verbal/tactile cueing to follow instructions  Ambulation/Gait Ambulation/Gait assistance: Mod assist Gait Distance (Feet): 5 Feet Assistive device: Rolling walker (2 wheeled) Gait Pattern/deviations: Decreased step length - right;Decreased step length - left;Decreased stride length Gait velocity: slow   General Gait Details: limited to 5-6 slow unsteady labored side steps at bedside due to weakness and poor standing balance  Stairs            Wheelchair Mobility    Modified Rankin (Stroke Patients Only)       Balance Overall balance assessment: Needs assistance Sitting-balance support: Feet supported;No upper extremity supported Sitting balance-Leahy Scale: Fair Sitting balance - Comments: seated at EOB   Standing balance support: During functional activity;Bilateral upper extremity supported Standing balance-Leahy Scale: Poor Standing balance comment: fair/poor using RW                             Pertinent Vitals/Pain Pain Assessment: No/denies pain    Home Living Family/patient expects to be discharged to:: Private residence Living Arrangements: Spouse/significant other Available Help at Discharge: Other (Comment)           Home Equipment: Walker - 2 wheels Additional Comments: Pt is poor historian, unable to provide much PLOF information    Prior Function Level of Independence: Needs assistance   Gait / Transfers  Assistance Needed: Pt reports using RW for functional mobility  ADL's / Homemaking Assistance Needed: Pt reports girlfriend, Santiago Glad, helps with dressing         Hand Dominance   Dominant Hand:  Right    Extremity/Trunk Assessment   Upper Extremity Assessment Upper Extremity Assessment: Defer to OT evaluation    Lower Extremity Assessment Lower Extremity Assessment: Generalized weakness    Cervical / Trunk Assessment Cervical / Trunk Assessment: Normal  Communication   Communication: No difficulties  Cognition Arousal/Alertness: Awake/alert;Lethargic Behavior During Therapy: Flat affect Overall Cognitive Status: No family/caregiver present to determine baseline cognitive functioning                                        General Comments      Exercises     Assessment/Plan    PT Assessment Patient needs continued PT services  PT Problem List Decreased strength;Decreased activity tolerance;Decreased balance;Decreased mobility       PT Treatment Interventions Gait training;Stair training;Functional mobility training;Therapeutic activities;Manual techniques;Patient/family education    PT Goals (Current goals can be found in the Care Plan section)  Acute Rehab PT Goals Patient Stated Goal: return home with family to assist PT Goal Formulation: With patient Time For Goal Achievement: Nov 15, 2019 Potential to Achieve Goals: Good    Frequency Min 3X/week   Barriers to discharge        Co-evaluation PT/OT/SLP Co-Evaluation/Treatment: Yes Reason for Co-Treatment: Complexity of the patient's impairments (multi-system involvement);Necessary to address cognition/behavior during functional activity PT goals addressed during session: Mobility/safety with mobility;Balance;Proper use of DME OT goals addressed during session: ADL's and self-care       AM-PAC PT "6 Clicks" Mobility  Outcome Measure Help needed turning from your back to your side while in a flat bed without using bedrails?: A Little Help needed moving from lying on your back to sitting on the side of a flat bed without using bedrails?: A Lot Help needed moving to and from a bed to a  chair (including a wheelchair)?: A Lot Help needed standing up from a chair using your arms (e.g., wheelchair or bedside chair)?: A Lot Help needed to walk in hospital room?: A Lot Help needed climbing 3-5 steps with a railing? : Total 6 Click Score: 12    End of Session Equipment Utilized During Treatment: Gait belt;Oxygen Activity Tolerance: Patient limited by fatigue;Patient limited by lethargy;Patient tolerated treatment well Patient left: in chair;with call bell/phone within reach;with chair alarm set Nurse Communication: Mobility status PT Visit Diagnosis: Unsteadiness on feet (R26.81);Other abnormalities of gait and mobility (R26.89);Muscle weakness (generalized) (M62.81)    Time: 4944-9675 PT Time Calculation (min) (ACUTE ONLY): 25 min   Charges:   PT Evaluation $PT Eval Moderate Complexity: 1 Mod PT Treatments $Therapeutic Activity: 23-37 mins        8:32 AM, 10/31/19 Lonell Grandchild, MPT Physical Therapist with [facility] 336 [phone] office [phone] mobile phone

## 2019-10-31 NOTE — Plan of Care (Signed)
  Problem: Acute Rehab PT Goals(only PT should resolve) Goal: Pt Will Go Supine/Side To Sit Outcome: Progressing Flowsheets (Taken 10/31/2019 0834) Pt will go Supine/Side to Sit: with min guard assist Goal: Patient Will Transfer Sit To/From Stand Outcome: Progressing Flowsheets (Taken 10/31/2019 [phone]) Patient will transfer sit to/from stand:  with min guard assist  with minimal assist Goal: Pt Will Transfer Bed To Chair/Chair To Bed Outcome: Progressing Flowsheets (Taken 10/31/2019 0834) Pt will Transfer Bed to Chair/Chair to Bed:  min guard assist  with min assist Goal: Pt Will Ambulate Outcome: Progressing Flowsheets (Taken 10/31/2019 0834) Pt will Ambulate:  25 feet  with minimal assist  with rolling walker   8:34 AM, 10/31/19 Lonell Grandchild, MPT Physical Therapist with [facility] [phone] office [phone] mobile phone

## 2019-10-31 NOTE — NC FL2 (Signed)
[place] LEVEL OF CARE SCREENING TOOL     IDENTIFICATION  Patient Name: Shaun May Birthdate: 19-Aug-1951 Sex: male Admission Date (Current Location): 10/30/2019  [facility] and [place] Number:  [employer] and Address:  [facility] [address], [place]      Provider Number: [id number]  Attending Physician Name and Address:  Roxan Hockey, MD  Relative Name and Phone Number:  Rayetta Humphrey [phone]    Current Level of Care: Hospital Recommended Level of Care: [place] Prior Approval Number:    Date Approved/Denied:   PASRR Number: [id number]  Discharge Plan: SNF    Current Diagnoses: Patient Active Problem List   Diagnosis Date Noted  . Cellulitis of right leg 10/30/2019  . Cellulitis of left leg 10/30/2019  . Cirrhosis of liver with ascites ([place]) 08/08/2019  . Cellulitis of leg, left 08/08/2019  . CHF exacerbation ([place]) 05/29/2018  . CHF (congestive heart failure) ([place]) 05/27/2018  . Chronic respiratory failure with hypoxia (HCC)/ nocturnal hypoxemia  08/15/2017  . Symptomatic anemia 05/25/2016  . Anemia 05/25/2016  . Hypokalemia 05/25/2016  . Hyponatremia 05/25/2016  . Atrial flutter ([place]), paroxysmal   . Pericardial effusion   . Acute on chronic systolic (congestive) heart failure ([place]) 03/29/2016  . 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 Elevated troponin 03/29/2016  . Restless leg syndrome 03/29/2016  . 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) 03/29/2016  . Chest pain 03/27/2016  . COPD exacerbation ([place]) 03/27/2016  . Hypertension   . Essential hypertension   . Bradycardia 10/25/2015  . Heart block AV second degree 10/25/2015  . Accelerated hypertension 10/25/2015  . Hemochromatosis 10/25/2015  . Cigarette smoker 10/25/2015  . Alcohol dependence ([place]) 10/25/2015  . Second degree AV block 10/25/2015    Orientation RESPIRATION BLADDER Height &  Weight     Self, Time, Situation  Normal External catheter Weight: 90.7 kg Height:  5\' 10"  (177.8 cm)  BEHAVIORAL SYMPTOMS/MOOD NEUROLOGICAL BOWEL NUTRITION STATUS      Continent Diet  AMBULATORY STATUS COMMUNICATION OF NEEDS Skin   Extensive Assist Verbally Bruising(leg left)                       Personal Care Assistance Level of Assistance  Bathing, Feeding, Dressing Bathing Assistance: Maximum assistance Feeding assistance: Maximum assistance Dressing Assistance: Maximum assistance     Functional Limitations Info  Sight, Hearing, Speech Sight Info: Impaired Hearing Info: Adequate Speech Info: Adequate    SPECIAL CARE FACTORS FREQUENCY  PT (By licensed PT)     PT Frequency: 5 times a week              Contractures Contractures Info: Not present    Additional Factors Info  Code Status, Allergies Code Status Info: FULL Allergies Info: xarelto, eliquis           Current Medications (10/31/2019):  This is the current hospital active medication list Current Facility-Administered Medications  Medication Dose Route Frequency Provider Last Rate Last Dose  . 0.9 %  sodium chloride infusion  250 mL Intravenous PRN Norins, Heinz Knuckles, MD      . acetaminophen (TYLENOL) tablet 650 mg  650 mg Oral Q6H PRN Norins, Heinz Knuckles, MD       Or  . acetaminophen (TYLENOL) suppository 650 mg  650 mg Rectal Q6H PRN Norins, Heinz Knuckles, MD      .  albuterol (PROVENTIL) (2.5 MG/3ML) 0.083% nebulizer solution 3 mL  3 mL Inhalation Q6H PRN Norins, Heinz Knuckles, MD      . atorvastatin (LIPITOR) tablet 40 mg  40 mg Oral QPM Norins, Heinz Knuckles, MD   40 mg at 10/30/19 2326  . carvedilol (COREG) tablet 12.5 mg  12.5 mg Oral BID WC Norins, Heinz Knuckles, MD   12.5 mg at 10/31/19 0814  . cefTRIAXone (ROCEPHIN) 1 g in sodium chloride 0.9 % 100 mL IVPB  1 g Intravenous Q24H Emokpae, Courage, MD 200 mL/hr at 10/31/19 1036 1 g at 10/31/19 1036  . dextromethorphan-guaiFENesin (MUCINEX DM) 30-600 MG per 12  hr tablet 1 tablet  1 tablet Oral Daily Norins, Heinz Knuckles, MD   1 tablet at 10/31/19 [phone]  . docusate sodium (COLACE) capsule 100 mg  100 mg Oral BID Neena Rhymes, MD   100 mg at 10/31/19 8325  . enoxaparin (LOVENOX) injection 40 mg  40 mg Subcutaneous Q24H Norins, Heinz Knuckles, MD   40 mg at 10/30/19 2328  . fluticasone furoate-vilanterol (BREO ELLIPTA) 100-25 MCG/INH 1 puff  1 puff Inhalation Daily Norins, Heinz Knuckles, MD   1 puff at 10/31/19 1012   And  . umeclidinium bromide (INCRUSE ELLIPTA) 62.5 MCG/INH 1 puff  1 puff Inhalation Daily Norins, Heinz Knuckles, MD   1 puff at 10/31/19 1015  . ibuprofen (ADVIL) tablet 200 mg  200 mg Oral Q6H PRN Norins, Heinz Knuckles, MD      . LORazepam (ATIVAN) tablet 2 mg  2 mg Oral BID Norins, Heinz Knuckles, MD   2 mg at 10/30/19 2327  . magnesium oxide (MAG-OX) tablet 200 mg  200 mg Oral Daily Norins, Heinz Knuckles, MD   200 mg at 10/31/19 0814  . oxyCODONE-acetaminophen (PERCOCET/ROXICET) 5-325 MG per tablet 1 tablet  1 tablet Oral Q6H Norins, Heinz Knuckles, MD   1 tablet at 10/31/19 [phone]  . pantoprazole (PROTONIX) EC tablet 40 mg  40 mg Oral Daily Norins, Heinz Knuckles, MD   40 mg at 10/31/19 0814  . rOPINIRole (REQUIP) tablet 1 mg  1 mg Oral QHS Norins, Heinz Knuckles, MD   1 mg at 10/30/19 2327  . sodium chloride flush (NS) 0.9 % injection 3 mL  3 mL Intravenous Q12H Norins, Heinz Knuckles, MD   3 mL at 10/31/19 0817  . sodium chloride flush (NS) 0.9 % injection 3 mL  3 mL Intravenous PRN Norins, Heinz Knuckles, MD      . torsemide [facility]) tablet 20 mg  20 mg Oral TID Neena Rhymes, MD   20 mg at 10/31/19 6415     Discharge Medications: Please see discharge summary for a list of discharge medications.  Relevant Imaging Results:  Relevant Lab Results:   Additional Information [id number]  Boneta Lucks, RN

## 2019-10-31 NOTE — Progress Notes (Signed)
Pt's girlfriend is bedside

## 2019-10-31 NOTE — Progress Notes (Signed)
Patient Demographics:    Shaun May, is a 68 y.o. male, DOB - 22-Dec-1950, XQJ:[id number]  Admit date - 10/30/2019   Admitting Physician Neena Rhymes, MD  Outpatient Primary MD for the patient is Celene Squibb, MD  LOS - 1   Chief Complaint  Patient presents with   Recurrent Skin Infections        Subjective:    Shaun May today has no fevers, no emesis,  No chest pain,   -  Assessment  & Plan :    Principal Problem:   Cellulitis of left leg/Wound Infection Active Problems:   COPD GOLD IV/ 02 dep at hs/ still smoking    Acute on chronic respiratory failure with hypoxia (HCC)   (HFpEF) heart failure with preserved ejection fraction/EF 44-81%/ Chronic Diastolic Dysfunction CHF   Hypertension   Cirrhosis of liver with ascites ([place])   ETOH abuse  Brief Summary:- 68 y.o. 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  Chronic venous insufficiency, history of left leg cellulitis, history of cirrhosis with ascites status post paracentesis in the last month.  Patient lives alone with an intermittent visitation by a girlfriend.  According to the patient's son he is irregular in taking his medications as instructed  A/p  1)Lt Leg Cellulitis/Wound infection --- open wounds on left lower extremity- with secondary infection-empiric treatment with IV Rocephin continue -MRSA PCR pending  2) alcoholic liver cirrhosis--requiring recurrent paracenteses -Decrease torsemide to 20 mg daily for now  3) alcohol abuse--- patient drinks a lot of [name], is a heavy drinker-- high risk for delirium tremens, IV banana bag as ordered, lorazepam per CIWA protocol, thiamine and folic acid as ordered  4) generalized weakness and deconditioning--recurrent  falls at home -Physical therapy evaluation appreciated recommend SNF rehab  5) COPD and tobacco abuse--continues to smoke despite being on home O2--- continue bronchodilators  6) chronic hypoxic respiratory failure--- secondary to #5 above, PTA patient used 2 to 3 L of oxygen at home continue oxygen supplementation  7)HFpEF--- last known EF 60 to 65%, patient with stable chronic diastolic CHF/nonischemic cardiomyopathy, change torsemide to 20 mg daily,  8) social/ethics--patient technically lives alone Wandra Arthurs friend has been technically living with him lately due to weakness and recurrent falls -Patient and girlfriend drink alcohol together--- patient is a full code   9) history of complete heart block--- status post permanent pacemaker placement since 2016, patient follows with Dr. Lovena Le  10) persistent atrial fibrillation/flutter--- continue Coreg, not a candidate for anticoagulation due to recurrent GI bleed and hematuria in an alcoholic male with recurrent falls  Disposition/Need for in-Hospital Stay- patient unable to be discharged at this time due to -awaiting SNF rehab bed  Code Status : Full code  Family Communication:    Male Friend at bedside  Disposition Plan  : SNF rehab  Consults  :  na  DVT Prophylaxis  :  Lovenox -  - SCDs   Lab Results  Component Value Date   PLT 132 (L) 10/30/2019    Inpatient Medications  Scheduled Meds:  atorvastatin  40 mg Oral QPM  carvedilol  12.5 mg Oral BID WC   dextromethorphan-guaiFENesin  1 tablet Oral Daily   docusate sodium  100 mg Oral BID   enoxaparin (LOVENOX) injection  40 mg Subcutaneous Q24H   feeding supplement (ENSURE ENLIVE)  237 mL Oral TID BM   fluticasone furoate-vilanterol  1 puff Inhalation Daily   And   umeclidinium bromide  1 puff Inhalation Daily   [START ON 62/37/6283] folic acid  1 mg Oral Daily   LORazepam  0-4 mg Intravenous Q6H   Followed by   Derrill Memo ON 11/02/2019] LORazepam  0-4 mg  Intravenous Q12H   LORazepam  2 mg Oral BID   magnesium oxide  200 mg Oral Daily   [START ON 11/01/2019] multivitamin with minerals  1 tablet Oral Daily   oxyCODONE-acetaminophen  1 tablet Oral Q6H   pantoprazole  40 mg Oral Daily   rOPINIRole  1 mg Oral QHS   sodium chloride flush  3 mL Intravenous Q12H   [START ON 11/01/2019] thiamine  100 mg Oral Daily   Or   [START ON 11/01/2019] thiamine  100 mg Intravenous Daily   [START ON 11/01/2019] torsemide  20 mg Oral Daily   Continuous Infusions:  sodium chloride     cefTRIAXone (ROCEPHIN)  IV 1 g (10/31/19 1036)   banana bag IV 1000 mL     PRN Meds:.sodium chloride, acetaminophen **OR** acetaminophen, albuterol, ibuprofen, LORazepam **OR** LORazepam, sodium chloride flush    Anti-infectives (From admission, onward)   Start     Dose/Rate Route Frequency Ordered Stop   10/31/19 0900  cefTRIAXone (ROCEPHIN) 1 g in sodium chloride 0.9 % 100 mL IVPB     1 g 200 mL/hr over 30 Minutes Intravenous Every 24 hours 10/31/19 0848          Objective:   Vitals:   10/30/19 1830 10/30/19 2257 10/31/19 0501 10/31/19 1012  BP: 106/70 107/70 111/75   Pulse: (!) 59 62 60   Resp:  20 20   Temp:  98 F (36.7 C) 98.3 F (36.8 C)   TempSrc:  Oral Oral   SpO2: (!) 83% (!) 79% (!) 88% 91%  Weight:      Height:        Wt Readings from Last 3 Encounters:  10/30/19 90.7 kg  10/05/19 91.6 kg  09/06/19 91.6 kg    No intake or output data in the 24 hours ending 10/31/19 1751   Physical Exam  Gen:- Awake Alert,  In no apparent distress  HEENT:- [place].AT, No sclera icterus Neck-Supple Neck,No JVD,.  Lungs-  CTAB , fair symmetrical air movement CV- S1, S2 normal, regular  Abd-  +ve B.Sounds, Abd Soft, No tenderness,    Extremity/Skin:-  , pedal pulses present , left leg with areas of excoriation/abrasion open wounds with secondary erythema, swelling and warmth and tenderness consistent with cellulitis secondary wound  infection Psych-affect is appropriate, oriented x3 Neuro-no new focal deficits, no tremors   Data Review:   Micro Results Recent Results (from the past 240 hour(s))  Culture, body fluid-bottle     Status: None   Collection Time: 10/24/19  3:00 PM   Specimen: Ascitic  Result Value Ref Range Status   Specimen Description ASCITIC  Final   Special Requests 10CC  Final   Culture   Final    NO GROWTH 3 DAYS Performed at [facility], [address] [place], [place] [address]    Report Status 10/28/2019 FINAL  Final  Gram stain  Status: None   Collection Time: 10/24/19  3:09 PM   Specimen: Ascitic; Body Fluid  Result Value Ref Range Status   Specimen Description ASCITIC  Final   Special Requests NONE  Final   Gram Stain   Final    NO ORGANISMS SEEN WBC PRESENT, PREDOMINANTLY PMN CYTOSPIN SMEAR Performed at [facility], [address], [place], [place] [address]    Report Status 10/24/2019 FINAL  Final  Culture, blood (single)     Status: None (Preliminary result)   Collection Time: 10/30/19  7:08 PM   Specimen: Right Antecubital; Blood  Result Value Ref Range Status   Specimen Description RIGHT ANTECUBITAL  Final   Special Requests   Final    BOTTLES DRAWN AEROBIC AND ANAEROBIC Blood Culture adequate volume   Culture   Final    NO GROWTH < 12 HOURS Performed at [facility], [address], [place], [place] [address]    Report Status PENDING  Incomplete    Radiology Reports Dg Chest 2 View  Result Date: 10/30/2019 CLINICAL DATA:  Lower extremity redness, swelling and increased fluid retention, more confusion evening, multiple falls this past weekend, laid on floor for hours, history smoking, pacemaker, atrial fibrillation, COPD, chronic kidney disease, hypertension, CHF, RIGHT upper lobe lung cancer EXAM: CHEST - 2 VIEW COMPARISON:  08/10/2019 Correlation: CT angio chest 05/17/2018 FINDINGS: LEFT subclavian sequential transvenous pacemaker leads project at RIGHT atrium  and RIGHT ventricle. Normal heart size, mediastinal contours, and pulmonary vascularity. Atherosclerotic calcification aorta. Scarring in RIGHT upper lobe with associated stellate opacity, question post radiation therapy change; this has increased since the previous study. Atelectasis versus consolidation LEFT lower lobe. Remaining lungs clear. Small LEFT and tiny RIGHT pleural effusions. No pneumothorax or acute osseous findings. IMPRESSION: Atelectasis versus consolidation LEFT lower lobe with associated LEFT pleural effusion. Increased delayed opacity in the RIGHT upper lobe question progressive post radiation therapy changes. Tiny RIGHT pleural effusion. Aortic Atherosclerosis (ICD10-I70.0). Electronically Signed   By: Lavonia Dana M.D.   On: 10/30/2019 14:58   US Venous Img Lower Unilateral Left (dvt)  Result Date: 10/30/2019 CLINICAL DATA:  68 year old male with left lower extremity redness and swelling for 1 week EXAM: LEFT LOWER EXTREMITY VENOUS DOPPLER ULTRASOUND TECHNIQUE: Gray-scale sonography with graded compression, as well as color Doppler and duplex ultrasound were performed to evaluate the lower extremity deep venous systems from the level of the common femoral vein and including the common femoral, femoral, profunda femoral, popliteal and calf veins including the posterior tibial, peroneal and gastrocnemius veins when visible. The superficial great saphenous vein was also interrogated. Spectral Doppler was utilized to evaluate flow at rest and with distal augmentation maneuvers in the common femoral, femoral and popliteal veins. COMPARISON:  None. FINDINGS: Contralateral Common Femoral Vein: Respiratory phasicity is normal and symmetric with the symptomatic side. No evidence of thrombus. Normal compressibility. Common Femoral Vein: No evidence of thrombus. Normal compressibility, respiratory phasicity and response to augmentation. Saphenofemoral Junction: No evidence of thrombus. Normal  compressibility and flow on color Doppler imaging. Profunda Femoral Vein: No evidence of thrombus. Normal compressibility and flow on color Doppler imaging. Femoral Vein: No evidence of thrombus. Normal compressibility, respiratory phasicity and response to augmentation. Popliteal Vein: No evidence of thrombus. Normal compressibility, respiratory phasicity and response to augmentation. Calf Veins: No evidence of thrombus. Normal compressibility and flow on color Doppler imaging. Superficial Great Saphenous Vein: No evidence of thrombus. Normal compressibility. Venous Reflux:  None. Other Findings:  None. IMPRESSION: No evidence  of deep venous thrombosis. Electronically Signed   By: Jacqulynn Cadet M.D.   On: 10/30/2019 14:45   US Paracentesis  Result Date: 10/24/2019 INDICATION: Cirrhosis, ascites EXAM: ULTRASOUND GUIDED DIAGNOSTIC AND THERAPEUTIC PARACENTESIS MEDICATIONS: NONE COMPLICATIONS: NONE IMMEDIATE PROCEDURE: Informed written consent was obtained from the patient after a discussion of the risks, benefits and alternatives to treatment. A timeout was performed prior to the initiation of the procedure. Initial ultrasound scanning demonstrates a moderate amount of ascites within the LEFT lower abdominal quadrant. The right lower abdomen was prepped and draped in the usual sterile fashion. 1% lidocaine was used for local anesthesia. Following this, a 5 [place] Yueh catheter was introduced. An ultrasound image was saved for documentation purposes. The paracentesis was performed. The catheter was removed and a dressing was applied. The patient tolerated the procedure well without immediate post procedural complication. Patient received post-procedure intravenous albumin; see nursing notes for details. FINDINGS: A total of approximately 2.9 L of amber colored ascitic fluid was removed. Samples were sent to the laboratory as requested by the clinical team. IMPRESSION: Successful ultrasound-guided paracentesis  yielding 2.9 liters of peritoneal fluid. Electronically Signed   By: Lavonia Dana M.D.   On: 10/24/2019 16:07     CBC Recent Labs  Lab 10/30/19 1302  WBC 9.2  HGB 10.5*  HCT 35.2*  PLT 132*  MCV 98.9  MCH 29.5  MCHC 29.8*  RDW 16.8*  LYMPHSABS 0.2*  MONOABS 0.2  EOSABS 0.0  BASOSABS 0.0    Chemistries  Recent Labs  Lab 10/30/19 1302 10/31/19 0452  NA 132* 134*  K 3.1* 3.6  CL 87* 87*  CO2 32 31  GLUCOSE 90 116*  BUN 62* 64*  CREATININE 1.95* 2.05*  CALCIUM 7.0* 7.3*  AST 34  --   ALT 18  --   ALKPHOS 86  --   BILITOT 2.0*  --    ------------------------------------------------------------------------------------------------------------------ No results for input(s): CHOL, HDL, LDLCALC, TRIG, CHOLHDL, LDLDIRECT in the last 72 hours.  No results found for: HGBA1C ------------------------------------------------------------------------------------------------------------------ No results for input(s): TSH, T4TOTAL, T3FREE, THYROIDAB in the last 72 hours.  Invalid input(s): FREET3 ------------------------------------------------------------------------------------------------------------------ No results for input(s): VITAMINB12, FOLATE, FERRITIN, TIBC, IRON, RETICCTPCT in the last 72 hours.  Coagulation profile Recent Labs  Lab 10/30/19 1302  INR 1.4*    No results for input(s): DDIMER in the last 72 hours.  Cardiac Enzymes No results for input(s): CKMB, TROPONINI, MYOGLOBIN in the last 168 hours.  Invalid input(s): CK ------------------------------------------------------------------------------------------------------------------    Component Value Date/Time   BNP 601.0 (H) 08/10/2019 9842     Roxan Hockey M.D on 10/31/2019 at 5:51 PM  Go to [email or web address] - for contact info  [facility] - Office  [phone]

## 2019-10-31 NOTE — TOC Initial Note (Signed)
Transition of Care [facility]) - Initial/Assessment Note    Patient Details  Name: Shaun May MRN: [id number] Date of Birth: June 24, 1951  [facility] CM/SW Contact:    Boneta Lucks, RN Phone Number: 10/31/2019, 4:24 PM  Clinical Narrative:    Patient admitted for hypertension. Patient has high readmission score. Patient lives alone, Santiago Glad a girl friend checks in on him.          Per Santiago Glad, patient is getting weaker and multiple falls. . Pt has PCP, transportation and insurance with drug coverage. PT is recommending SNF. Patient agrees, FL2 done and referrals sent. TOC to follow.   Expected Discharge Plan: Skilled Nursing Facility Barriers to Discharge: Continued Medical Work up   Patient Goals and CMS Choice Patient states their goals for this hospitalization and ongoing recovery are:: to go to SNF then return home. CMS Medicare.gov Compare Post Acute Care list provided to:: Patient Choice offered to / list presented to : Patient  Expected Discharge Plan and Services Expected Discharge Plan: [place]       Living arrangements for the past 2 months: Single Family Home                       Prior Living Arrangements/Services Living arrangements for the past 2 months: Single Family Home Lives with:: Self          Need for Family Participation in Patient Care: Yes (Comment) Care giver support system in place?: Yes (comment) Current home services: DME Criminal Activity/Legal Involvement Pertinent to Current Situation/Hospitalization: No - Comment as needed  Activities of Daily Living Home Assistive Devices/Equipment: Cane (specify quad or straight), Walker (specify type) ADL Screening (condition at time of admission) Patient's cognitive ability adequate to safely complete daily activities?: Yes Is the patient deaf or have difficulty hearing?: Yes Does the patient have difficulty seeing, even when wearing glasses/contacts?: No Does the patient  have difficulty concentrating, remembering, or making decisions?: No Patient able to express need for assistance with ADLs?: Yes Does the patient have difficulty dressing or bathing?: Yes Independently performs ADLs?: No Communication: Independent Dressing (OT): Needs assistance Is this a change from baseline?: Pre-admission baseline Grooming: Needs assistance Is this a change from baseline?: Pre-admission baseline Feeding: Independent Bathing: Needs assistance Is this a change from baseline?: Pre-admission baseline Toileting: Independent In/Out Bed: Needs assistance Is this a change from baseline?: Pre-admission baseline Walks in Home: Needs assistance Is this a change from baseline?: Pre-admission baseline Does the patient have difficulty walking or climbing stairs?: Yes Weakness of Legs: Both Weakness of Arms/Hands: None  Permission Sought/Granted   Permission granted to share information with : Yes, Verbal Permission Granted  Share Information with NAME: Santiago Glad     Permission granted to share info w Relationship: Girl Friend     Emotional Assessment       Orientation: : Oriented to Self, Oriented to  Time Alcohol / Substance Use: Not Applicable Psych Involvement: No (comment)  Admission diagnosis:  Pain [R52] Leg pain, bilateral [P79.480, M79.605] Chronic obstructive pulmonary disease, unspecified COPD type ([place]) [J44.9] Patient Active Problem List   Diagnosis Date Noted  . Cellulitis of right leg 10/30/2019  . Cellulitis of left leg 10/30/2019  . Cirrhosis of liver with ascites ([place]) 08/08/2019  . Cellulitis of leg, left 08/08/2019  . CHF exacerbation ([place]) 05/29/2018  . CHF (congestive heart failure) ([place]) 05/27/2018  . Chronic respiratory failure with hypoxia (HCC)/ nocturnal hypoxemia  08/15/2017  .  Symptomatic anemia 05/25/2016  . Anemia 05/25/2016  . Hypokalemia 05/25/2016  . Hyponatremia 05/25/2016  . Atrial flutter ([place]), paroxysmal   . Pericardial  effusion   . Acute on chronic systolic (congestive) heart failure ([place]) 03/29/2016  . 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 Elevated troponin 03/29/2016  . Restless leg syndrome 03/29/2016  . Presumed cancer of right upper lobe of lung ([place]) 03/29/2016  . Chest pain 03/27/2016  . COPD exacerbation ([place]) 03/27/2016  . Hypertension   . Essential hypertension   . Bradycardia 10/25/2015  . Heart block AV second degree 10/25/2015  . Accelerated hypertension 10/25/2015  . Hemochromatosis 10/25/2015  . Cigarette smoker 10/25/2015  . Alcohol dependence ([place]) 10/25/2015  . Second degree AV block 10/25/2015   PCP:  Celene Squibb, MD Pharmacy:   [facility], [place] [address] Phone: [phone] Fax: [phone]     Social Determinants of Health (SDOH) Interventions    Readmission Risk Interventions Readmission Risk Prevention Plan 10/31/2019  Transportation Screening Complete  PCP or Specialist Appt within 3-5 Days Not Complete  HRI or [place] Complete  Social Work Consult for [place] Planning/Counseling Complete  Palliative Care Screening Not Complete  Medication Review Press photographer) Complete  Some recent data might be hidden

## 2019-11-01 ENCOUNTER — Inpatient Hospital Stay (HOSPITAL_COMMUNITY): Payer: Medicare Other

## 2019-11-01 LAB — MRSA PCR SCREENING: MRSA by PCR: NEGATIVE

## 2019-11-01 LAB — CBC WITH DIFFERENTIAL/PLATELET
Abs Immature Granulocytes: 0.05 10*3/uL (ref 0.00–0.07)
Basophils Absolute: 0 10*3/uL (ref 0.0–0.1)
Basophils Relative: 0 %
Eosinophils Absolute: 0 10*3/uL (ref 0.0–0.5)
Eosinophils Relative: 0 %
HCT: 38.2 % — ABNORMAL LOW (ref 39.0–52.0)
Hemoglobin: 11 g/dL — ABNORMAL LOW (ref 13.0–17.0)
Immature Granulocytes: 0 %
Lymphocytes Relative: 2 %
Lymphs Abs: 0.2 10*3/uL — ABNORMAL LOW (ref 0.7–4.0)
MCH: 29.1 pg (ref 26.0–34.0)
MCHC: 28.8 g/dL — ABNORMAL LOW (ref 30.0–36.0)
MCV: 101.1 fL — ABNORMAL HIGH (ref 80.0–100.0)
Monocytes Absolute: 0.4 10*3/uL (ref 0.1–1.0)
Monocytes Relative: 4 %
Neutro Abs: 10.9 10*3/uL — ABNORMAL HIGH (ref 1.7–7.7)
Neutrophils Relative %: 94 %
Platelets: 141 10*3/uL — ABNORMAL LOW (ref 150–400)
RBC: 3.78 MIL/uL — ABNORMAL LOW (ref 4.22–5.81)
RDW: 16.6 % — ABNORMAL HIGH (ref 11.5–15.5)
WBC: 11.7 10*3/uL — ABNORMAL HIGH (ref 4.0–10.5)
nRBC: 0.6 % — ABNORMAL HIGH (ref 0.0–0.2)

## 2019-11-01 LAB — COMPREHENSIVE METABOLIC PANEL
ALT: 15 U/L (ref 0–44)
AST: 28 U/L (ref 15–41)
Albumin: 3 g/dL — ABNORMAL LOW (ref 3.5–5.0)
Alkaline Phosphatase: 76 U/L (ref 38–126)
Anion gap: 14 (ref 5–15)
BUN: 71 mg/dL — ABNORMAL HIGH (ref 8–23)
CO2: 31 mmol/L (ref 22–32)
Calcium: 7.2 mg/dL — ABNORMAL LOW (ref 8.9–10.3)
Chloride: 88 mmol/L — ABNORMAL LOW (ref 98–111)
Creatinine, Ser: 2.41 mg/dL — ABNORMAL HIGH (ref 0.61–1.24)
GFR calc Af Amer: 31 mL/min — ABNORMAL LOW (ref >60)
GFR calc non Af Amer: 27 mL/min — ABNORMAL LOW (ref >60)
Glucose, Bld: 164 mg/dL — ABNORMAL HIGH (ref 70–99)
Potassium: 3.6 mmol/L (ref 3.5–5.1)
Sodium: 133 mmol/L — ABNORMAL LOW (ref 135–145)
Total Bilirubin: 1.5 mg/dL — ABNORMAL HIGH (ref 0.3–1.2)
Total Protein: 6.7 g/dL (ref 6.5–8.1)

## 2019-11-01 LAB — AMMONIA: Ammonia: 67 umol/L — ABNORMAL HIGH (ref 9–35)

## 2019-11-01 LAB — BLOOD GAS, VENOUS
Acid-Base Excess: 6.7 mmol/L — ABNORMAL HIGH (ref 0.0–2.0)
Bicarbonate: 26.9 mmol/L (ref 20.0–28.0)
FIO2: 100
O2 Saturation: 57.7 %
Patient temperature: 36.4
pCO2, Ven: 108 mmHg (ref 44.0–60.0)
pH, Ven: 7.142 — CL (ref 7.250–7.430)
pO2, Ven: 38.7 mmHg (ref 32.0–45.0)

## 2019-11-01 LAB — PHOSPHORUS: Phosphorus: 5.6 mg/dL — ABNORMAL HIGH (ref 2.5–4.6)

## 2019-11-01 LAB — MAGNESIUM: Magnesium: 1.4 mg/dL — ABNORMAL LOW (ref 1.7–2.4)

## 2019-11-01 LAB — LACTIC ACID, PLASMA: Lactic Acid, Venous: 1.6 mmol/L (ref 0.5–1.9)

## 2019-11-01 MED ORDER — METHYLPREDNISOLONE SODIUM SUCC 125 MG IJ SOLR
125.0000 mg | Freq: Once | INTRAMUSCULAR | Status: DC
  Filled 2019-11-01: qty 2

## 2019-11-01 MED ORDER — METHYLPREDNISOLONE SODIUM SUCC 40 MG IJ SOLR
40.0000 mg | Freq: Three times a day (TID) | INTRAMUSCULAR | Status: DC
  Administered 2019-11-01 – 2019-11-06 (×16): 40 mg via INTRAVENOUS
  Filled 2019-11-01 (×16): qty 1

## 2019-11-01 MED ORDER — FAMOTIDINE IN NACL 20-0.9 MG/50ML-% IV SOLN
20.0000 mg | INTRAVENOUS | Status: DC
  Administered 2019-11-01: 20 mg via INTRAVENOUS
  Filled 2019-11-01: qty 50

## 2019-11-01 MED ORDER — MAGNESIUM SULFATE 2 GM/50ML IV SOLN
2.0000 g | Freq: Once | INTRAVENOUS | Status: AC
  Administered 2019-11-01: 10:00:00 2 g via INTRAVENOUS
  Filled 2019-11-01: qty 50

## 2019-11-01 MED ORDER — IPRATROPIUM-ALBUTEROL 0.5-2.5 (3) MG/3ML IN SOLN: 3.0000 mL | Freq: Four times a day (QID) | RESPIRATORY_TRACT | Status: DC

## 2019-11-01 MED ORDER — ORAL CARE MOUTH RINSE
15.0000 mL | Freq: Two times a day (BID) | OROMUCOSAL | Status: DC
  Administered 2019-11-01 – 2019-11-07 (×12): 15 mL via OROMUCOSAL

## 2019-11-01 MED ORDER — IPRATROPIUM-ALBUTEROL 0.5-2.5 (3) MG/3ML IN SOLN
3.0000 mL | Freq: Four times a day (QID) | RESPIRATORY_TRACT | Status: DC
  Administered 2019-11-01 – 2019-11-05 (×18): 3 mL via RESPIRATORY_TRACT
  Filled 2019-11-01 (×17): qty 3

## 2019-11-01 MED ORDER — THIAMINE HCL 100 MG/ML IJ SOLN
Freq: Once | INTRAVENOUS | Status: AC
  Administered 2019-11-01: 11:00:00 via INTRAVENOUS
  Filled 2019-11-01: qty 1000

## 2019-11-01 MED ORDER — THIAMINE HCL 100 MG/ML IJ SOLN
Freq: Once | INTRAVENOUS | Status: DC
  Filled 2019-11-01: qty 1000

## 2019-11-01 MED ORDER — CHLORHEXIDINE GLUCONATE 0.12 % MT SOLN
15.0000 mL | Freq: Two times a day (BID) | OROMUCOSAL | Status: DC
  Administered 2019-11-01 – 2019-11-07 (×12): 15 mL via OROMUCOSAL
  Filled 2019-11-01 (×12): qty 15

## 2019-11-01 MED ORDER — SODIUM CHLORIDE 0.9 % IV SOLN
3.0000 g | Freq: Four times a day (QID) | INTRAVENOUS | Status: DC
  Filled 2019-11-01 (×6): qty 8

## 2019-11-01 MED ORDER — DEXMEDETOMIDINE HCL IN NACL 400 MCG/100ML IV SOLN
0.4000 ug/kg/h | INTRAVENOUS | Status: DC
  Administered 2019-11-01: 0.4 ug/kg/h via INTRAVENOUS
  Administered 2019-11-02: 0.798 ug/kg/h via INTRAVENOUS
  Administered 2019-11-02: 21:00:00 0.6 ug/kg/h via INTRAVENOUS
  Administered 2019-11-02: 0.7 ug/kg/h via INTRAVENOUS
  Administered 2019-11-02: 0.701 ug/kg/h via INTRAVENOUS
  Administered 2019-11-03 – 2019-11-04 (×4): 0.7 ug/kg/h via INTRAVENOUS
  Administered 2019-11-04: 0.9 ug/kg/h via INTRAVENOUS
  Filled 2019-11-01 (×11): qty 100

## 2019-11-01 MED ORDER — SODIUM CHLORIDE 0.9 % IV SOLN
INTRAVENOUS | Status: DC
  Administered 2019-11-01: 23:00:00 via INTRAVENOUS

## 2019-11-01 MED ORDER — CHLORHEXIDINE GLUCONATE CLOTH 2 % EX PADS
6.0000 | MEDICATED_PAD | Freq: Every day | CUTANEOUS | Status: DC
  Administered 2019-11-01 – 2019-11-07 (×7): 6 via TOPICAL

## 2019-11-01 MED ORDER — SODIUM CHLORIDE 0.9 % IV SOLN
3.0000 g | Freq: Three times a day (TID) | INTRAVENOUS | Status: DC
  Administered 2019-11-01 – 2019-11-02 (×4): 3 g via INTRAVENOUS
  Filled 2019-11-01 (×2): qty 8
  Filled 2019-11-01: qty 3
  Filled 2019-11-01 (×4): qty 8
  Filled 2019-11-01: qty 3
  Filled 2019-11-01 (×4): qty 8
  Filled 2019-11-01: qty 3

## 2019-11-01 NOTE — Progress Notes (Signed)
Patient Demographics:    Shaun May, is a 68 y.o. male, DOB - [birth year], OEH:[id number]  Admit date - 10/30/2019   Admitting Physician Neena Rhymes, MD  Outpatient Primary MD for the patient is Celene Squibb, MD  LOS - 2   Chief Complaint  Patient presents with   Recurrent Skin Infections        Subjective:    [name] today has no fevers, no emesis,  No chest pain,   - Decreased responsiveness overnight, as well as respiratory distress and hypoxia -Concerns about possible aspiration episode -Chest x-ray and VBG noted -Transfer to ICU -I called and updated patient's son, his significant other and his brother by phone, questions answered -Patient is a full code as per family -Waking up a bit more with BiPAP   Assessment  & Plan :    Principal Problem:   Cellulitis of left leg/Wound Infection Active Problems:   COPD GOLD IV/ 02 dep at hs/ still smoking    Acute on chronic respiratory failure with hypoxia (HCC)   (HFpEF) heart failure with preserved ejection fraction/EF 03-70%/ Chronic Diastolic Dysfunction CHF   Hypertension   Cirrhosis of liver with ascites ([place])   ETOH abuse  Brief Summary:- 68 y.o. 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  Chronic venous insufficiency, history of left leg cellulitis, history of cirrhosis with ascites status post paracentesis in the last month.  Patient lives alone with an intermittent visitation by a girlfriend.  According to the patient's son he is irregular in taking his medications as instructed -Lethargy and respiratory distress on 11/01/2019 transferred to ICU on BiPAP  A/p  1)Lt Leg Cellulitis/Wound infection --- open wounds on left lower extremity- with secondary infection- -MRSA  PCR Neg -Patient started on IV Unasyn for presumed aspiration pneumonia, not really for cellulitis  2) alcoholic liver cirrhosis--requiring recurrent paracenteses -Unable to give torsemide due to respiratory distress requiring continuous BiPAP as well as soft BP -Ammonia noted at 68  3) alcohol abuse--- patient drinks a lot of [name], he is a heavy drinker-- high risk for delirium tremens, IV banana bag as ordered, stop lorazepam per CIWA protocol due to excessive sedation -Patient placed on Precedex drip -IV banana bag with thiamine and folic acid as ordered  4) generalized weakness and deconditioning--recurrent falls at home -Physical therapy evaluation appreciated recommend SNF rehab  5) COPD and tobacco abuse--continues to smoke despite being on home O2--- continue bronchodilators and BiPAP  6)Acute on chronic chronic hypoxic respiratory failure--- secondary to #5 above, PTA patient used 2 to 3 L of oxygen at home continue oxygen supplementation --Acute on chronic respiratory failure secondary to presumed aspiration event on 10/31/2019 now requiring BiPAP -Chest x-ray on 11/01/2019 suggest CHF but cannot exclude pneumonia -Clinically suspect aspiration pneumonia -IV Unasyn for presumed aspiration pneumonia  7)HFpEF--- last known EF 60 to 65%, patient with stable chronic diastolic CHF/nonischemic cardiomyopathy, -unable to adequately diurese due to soft BP  8)Social/ethics--patient technically lives alone , but his girlfriend has been technically living with him lately due to weakness and recurrent falls -Patient and girlfriend drink alcohol  together--- patient is a full code  --I called and updated patient's son, his significant other and his brother by phone, questions answered -Patient is a full code as per family -Waking up a bit more with BiPAP  9) history of complete heart block--- status post permanent pacemaker placement since 2016, patient follows with Dr.  Lovena Le  10)Persistent atrial fibrillation/flutter--- unable to give Coreg due to soft BP, not a candidate for anticoagulation due to recurrent GI bleed and hematuria in an alcoholic male with recurrent falls  11) acute metabolic encephalopathy--multifactorial suspect some component of DTs, also some component of hepatic encephalopathy with elevated ammonia, and CO2 narcosis/hypercapnia in the setting of acute on chronic hypoxic and hypercapnic respiratory failure -Treat underlying etiologies and metabolic encephalopathy should improve  Disposition/Need for in-Hospital Stay- patient unable to be discharged at this time due to -severe hypoxic and hypercapnic respiratory distress requiring continuous BiPAP -Pulmonary status and mentation decompensated overnight,  Code Status : Full code  Family Communication:    Male Friend at bedside -Discussed with patient's son who lives in [place] also discussed with patient's brother  Disposition Plan  : To be determined  Consults  :  na  DVT Prophylaxis  :  Lovenox -  - SCDs   Lab Results  Component Value Date   PLT 141 (L) 11/01/2019   Inpatient Medications  Scheduled Meds:  atorvastatin  40 mg Oral QPM   carvedilol  12.5 mg Oral BID WC   chlorhexidine  15 mL Mouth Rinse BID   Chlorhexidine Gluconate Cloth  6 each Topical Daily   enoxaparin (LOVENOX) injection  40 mg Subcutaneous Q24H   feeding supplement (ENSURE ENLIVE)  237 mL Oral TID BM   fluticasone furoate-vilanterol  1 puff Inhalation Daily   And   umeclidinium bromide  1 puff Inhalation Daily   ipratropium-albuterol  3 mL Nebulization Q6H   LORazepam  0-4 mg Intravenous Q6H   Followed by   Derrill Memo ON 11/02/2019] LORazepam  0-4 mg Intravenous Q12H   magnesium oxide  200 mg Oral Daily   mouth rinse  15 mL Mouth Rinse q12n4p   methylPREDNISolone (SOLU-MEDROL) injection  125 mg Intravenous Once   methylPREDNISolone (SOLU-MEDROL) injection  40 mg Intravenous  Q8H   multivitamin with minerals  1 tablet Oral Daily   oxyCODONE-acetaminophen  1 tablet Oral Q6H   pantoprazole  40 mg Oral Daily   rOPINIRole  1 mg Oral QHS   sodium chloride flush  3 mL Intravenous Q12H   Continuous Infusions:  sodium chloride     ampicillin-sulbactam (UNASYN) IV Stopped (11/01/19 1801)   dexmedetomidine (PRECEDEX) IV infusion Stopped (11/01/19 1050)   famotidine (PEPCID) IV 20 mg (11/01/19 1808)   PRN Meds:.sodium chloride, acetaminophen **OR** acetaminophen, albuterol, sodium chloride flush  Anti-infectives (From admission, onward)   Start     Dose/Rate Route Frequency Ordered Stop   11/01/19 0900  Ampicillin-Sulbactam (UNASYN) 3 g in sodium chloride 0.9 % 100 mL IVPB     3 g 200 mL/hr over 30 Minutes Intravenous Every 8 hours 11/01/19 0843     11/01/19 0630  Ampicillin-Sulbactam (UNASYN) 3 g in sodium chloride 0.9 % 100 mL IVPB  Status:  Discontinued     3 g 200 mL/hr over 30 Minutes Intravenous Every 6 hours 11/01/19 0629 11/01/19 0843   10/31/19 0900  cefTRIAXone (ROCEPHIN) 1 g in sodium chloride 0.9 % 100 mL IVPB  Status:  Discontinued     1 g 200 mL/hr over  30 Minutes Intravenous Every 24 hours 10/31/19 0848 11/01/19 0807        Objective:   Vitals:   11/01/19 1745 11/01/19 1800 11/01/19 1815 11/01/19 1830  BP: (!) 87/72 (!) 102/51 (!) 86/57 91/61  Pulse: 79 65 65 65  Resp: (!) 21 18 11  (!) 21  Temp:      TempSrc:      SpO2: 97% 97% 93% 97%  Weight:      Height:        Wt Readings from Last 3 Encounters:  10/30/19 90.7 kg  10/05/19 91.6 kg  09/06/19 91.6 kg    Intake/Output Summary (Last 24 hours) at 11/01/2019 1844 Last data filed at 11/01/2019 1808 Gross per 24 hour  Intake 783.22 ml  Output 225 ml  Net 558.22 ml   Physical Exam Gen:-Initially very very lethargic, waking up more with BiPAP HEENT:- [place].AT, No sclera icterus Nose/Mouth--BiPAP mask Neck-Supple Neck,No JVD,.  Lungs-diminished breath sounds with scattered  rhonchi and wheezes CV- S1, S2 normal, regular  Abd-  +ve B.Sounds, Abd Soft, No tenderness,    Extremity/Skin:-  , pedal pulses present , left leg with areas of excoriation/abrasion open wounds with secondary erythema, swelling and warmth and tenderness consistent with cellulitis secondary wound infection Psych-lethargic, initially confused and disoriented more awake now after BiPAP use  neuro-disoriented and confused, neuro exam limited   Data Review:   Micro Results Recent Results (from the past 240 hour(s))  Culture, body fluid-bottle     Status: None   Collection Time: 10/24/19  3:00 PM   Specimen: Ascitic  Result Value Ref Range Status   Specimen Description ASCITIC  Final   Special Requests 10CC  Final   Culture   Final    NO GROWTH 3 DAYS Performed at [facility], [address], [place], [place] [address]    Report Status 10/28/2019 FINAL  Final  Gram stain     Status: None   Collection Time: 10/24/19  3:09 PM   Specimen: Ascitic; Body Fluid  Result Value Ref Range Status   Specimen Description ASCITIC  Final   Special Requests NONE  Final   Gram Stain   Final    NO ORGANISMS SEEN WBC PRESENT, PREDOMINANTLY PMN CYTOSPIN SMEAR Performed at [facility], [address], [place], [place] [address]    Report Status 10/24/2019 FINAL  Final  Culture, blood (single)     Status: None (Preliminary result)   Collection Time: 10/30/19  7:08 PM   Specimen: Right Antecubital; Blood  Result Value Ref Range Status   Specimen Description RIGHT ANTECUBITAL  Final   Special Requests   Final    BOTTLES DRAWN AEROBIC AND ANAEROBIC Blood Culture adequate volume   Culture   Final    NO GROWTH 2 DAYS Performed at [facility], [address] [place], [place] [address]    Report Status PENDING  Incomplete  SARS CORONAVIRUS 2 (TAT 6-24 HRS) Nasopharyngeal Nasopharyngeal Swab     Status: None   Collection Time: 10/31/19  7:01 AM   Specimen: Nasopharyngeal Swab  Result Value Ref Range  Status   SARS Coronavirus 2 NEGATIVE NEGATIVE Final    Comment: (NOTE) SARS-CoV-2 target nucleic acids are NOT DETECTED. The SARS-CoV-2 RNA is generally detectable in upper and lower respiratory specimens during the acute phase of infection. Negative results do not preclude SARS-CoV-2 infection, do not rule out co-infections with other pathogens, and should not be used as the sole basis for treatment or other  patient management decisions. Negative results must be combined with clinical observations, patient history, and epidemiological information. The expected result is Negative. Fact Sheet for Patients: SugarRoll.be Fact Sheet for Healthcare Providers: https://www.woods-mathews.com/ This test is not yet approved or cleared by the [place] FDA and  has been authorized for detection and/or diagnosis of SARS-CoV-2 by FDA under an Emergency Use Authorization (EUA). This EUA will remain  in effect (meaning this test can be used) for the duration of the COVID-19 declaration under Section 56 4(b)(1) of the Act, 21 U.S.C. section 360bbb-3(b)(1), unless the authorization is terminated or revoked sooner. Performed at [facility], [place] [address], [place], [place] [address]   MRSA PCR Screening     Status: None   Collection Time: 10/31/19  5:59 PM   Specimen: Nasal Mucosa; Nasopharyngeal  Result Value Ref Range Status   MRSA by PCR NEGATIVE NEGATIVE Final    Comment:        The GeneXpert MRSA Assay (FDA approved for NASAL specimens only), is one component of a comprehensive MRSA colonization surveillance program. It is not intended to diagnose MRSA infection nor to guide or monitor treatment for MRSA infections. Performed at [facility], [address] [place], [place] [address]     Radiology Reports Dg Chest 2 View  Result Date: 10/30/2019 CLINICAL DATA:  Lower extremity redness, swelling and increased fluid retention, more  confusion evening, multiple falls this past weekend, laid on floor for hours, history smoking, pacemaker, atrial fibrillation, COPD, chronic kidney disease, hypertension, CHF, RIGHT upper lobe lung cancer EXAM: CHEST - 2 VIEW COMPARISON:  08/10/2019 Correlation: CT angio chest 05/17/2018 FINDINGS: LEFT subclavian sequential transvenous pacemaker leads project at RIGHT atrium and RIGHT ventricle. Normal heart size, mediastinal contours, and pulmonary vascularity. Atherosclerotic calcification aorta. Scarring in RIGHT upper lobe with associated stellate opacity, question post radiation therapy change; this has increased since the previous study. Atelectasis versus consolidation LEFT lower lobe. Remaining lungs clear. Small LEFT and tiny RIGHT pleural effusions. No pneumothorax or acute osseous findings. IMPRESSION: Atelectasis versus consolidation LEFT lower lobe with associated LEFT pleural effusion. Increased delayed opacity in the RIGHT upper lobe question progressive post radiation therapy changes. Tiny RIGHT pleural effusion. Aortic Atherosclerosis (ICD10-I70.0). Electronically Signed   By: Lavonia Dana M.D.   On: 10/30/2019 14:58   US Venous Img Lower Unilateral Left (dvt)  Result Date: 10/30/2019 CLINICAL DATA:  68 year old male with left lower extremity redness and swelling for 1 week EXAM: LEFT LOWER EXTREMITY VENOUS DOPPLER ULTRASOUND TECHNIQUE: Gray-scale sonography with graded compression, as well as color Doppler and duplex ultrasound were performed to evaluate the lower extremity deep venous systems from the level of the common femoral vein and including the common femoral, femoral, profunda femoral, popliteal and calf veins including the posterior tibial, peroneal and gastrocnemius veins when visible. The superficial great saphenous vein was also interrogated. Spectral Doppler was utilized to evaluate flow at rest and with distal augmentation maneuvers in the common femoral, femoral and popliteal  veins. COMPARISON:  None. FINDINGS: Contralateral Common Femoral Vein: Respiratory phasicity is normal and symmetric with the symptomatic side. No evidence of thrombus. Normal compressibility. Common Femoral Vein: No evidence of thrombus. Normal compressibility, respiratory phasicity and response to augmentation. Saphenofemoral Junction: No evidence of thrombus. Normal compressibility and flow on color Doppler imaging. Profunda Femoral Vein: No evidence of thrombus. Normal compressibility and flow on color Doppler imaging. Femoral Vein: No evidence of thrombus. Normal compressibility, respiratory phasicity and response to augmentation. Popliteal  Vein: No evidence of thrombus. Normal compressibility, respiratory phasicity and response to augmentation. Calf Veins: No evidence of thrombus. Normal compressibility and flow on color Doppler imaging. Superficial Great Saphenous Vein: No evidence of thrombus. Normal compressibility. Venous Reflux:  None. Other Findings:  None. IMPRESSION: No evidence of deep venous thrombosis. Electronically Signed   By: Jacqulynn Cadet M.D.   On: 10/30/2019 14:45   US Paracentesis  Result Date: 10/24/2019 INDICATION: Cirrhosis, ascites EXAM: ULTRASOUND GUIDED DIAGNOSTIC AND THERAPEUTIC PARACENTESIS MEDICATIONS: NONE COMPLICATIONS: NONE IMMEDIATE PROCEDURE: Informed written consent was obtained from the patient after a discussion of the risks, benefits and alternatives to treatment. A timeout was performed prior to the initiation of the procedure. Initial ultrasound scanning demonstrates a moderate amount of ascites within the LEFT lower abdominal quadrant. The right lower abdomen was prepped and draped in the usual sterile fashion. 1% lidocaine was used for local anesthesia. Following this, a 5 [place] Yueh catheter was introduced. An ultrasound image was saved for documentation purposes. The paracentesis was performed. The catheter was removed and a dressing was applied. The patient  tolerated the procedure well without immediate post procedural complication. Patient received post-procedure intravenous albumin; see nursing notes for details. FINDINGS: A total of approximately 2.9 L of amber colored ascitic fluid was removed. Samples were sent to the laboratory as requested by the clinical team. IMPRESSION: Successful ultrasound-guided paracentesis yielding 2.9 liters of peritoneal fluid. Electronically Signed   By: Lavonia Dana M.D.   On: 10/24/2019 16:07   Dg Chest Port 1 View  Result Date: 11/01/2019 CLINICAL DATA:  Hypoxia. EXAM: PORTABLE CHEST 1 VIEW COMPARISON:  10/30/2019 FINDINGS: Dual lead pacemaker unchanged. Heart size upper normal. Vascular congestion has progressed. Progressive left lower lobe airspace disease and small left effusion. No effusion on the right. Surgical clips in the right upper lobe with spiculated mass lesion unchanged from prior study. IMPRESSION: Progression of vascular congestion. Progressive left lower lobe airspace disease and left effusion. Findings suggestive of fluid overload however pneumonia in the left lower lobe is possible. Electronically Signed   By: Franchot Gallo M.D.   On: 11/01/2019 06:50     CBC Recent Labs  Lab 10/30/19 1302 11/01/19 0640  WBC 9.2 11.7*  HGB 10.5* 11.0*  HCT 35.2* 38.2*  PLT 132* 141*  MCV 98.9 101.1*  MCH 29.5 29.1  MCHC 29.8* 28.8*  RDW 16.8* 16.6*  LYMPHSABS 0.2* 0.2*  MONOABS 0.2 0.4  EOSABS 0.0 0.0  BASOSABS 0.0 0.0    Chemistries  Recent Labs  Lab 10/30/19 1302 10/31/19 0452 11/01/19 0449 11/01/19 0640  NA 132* 134* 133*  --   K 3.1* 3.6 3.6  --   CL 87* 87* 88*  --   CO2 32 31 31  --   GLUCOSE 90 116* 164*  --   BUN 62* 64* 71*  --   CREATININE 1.95* 2.05* 2.41*  --   CALCIUM 7.0* 7.3* 7.2*  --   MG  --   --   --  1.4*  AST 34  --  28  --   ALT 18  --  15  --   ALKPHOS 86  --  76  --   BILITOT 2.0*  --  1.5*  --     ------------------------------------------------------------------------------------------------------------------ No results for input(s): CHOL, HDL, LDLCALC, TRIG, CHOLHDL, LDLDIRECT in the last 72 hours.  No results found for: HGBA1C ------------------------------------------------------------------------------------------------------------------ No results for input(s): TSH, T4TOTAL, T3FREE, THYROIDAB in the last 72 hours.  Invalid  input(s): FREET3 ------------------------------------------------------------------------------------------------------------------ No results for input(s): VITAMINB12, FOLATE, FERRITIN, TIBC, IRON, RETICCTPCT in the last 72 hours.  Coagulation profile Recent Labs  Lab 10/30/19 1302  INR 1.4*    No results for input(s): DDIMER in the last 72 hours.  Cardiac Enzymes No results for input(s): CKMB, TROPONINI, MYOGLOBIN in the last 168 hours.  Invalid input(s): CK ------------------------------------------------------------------------------------------------------------------    Component Value Date/Time   BNP 601.0 (H) 08/10/2019 7622     Roxan Hockey M.D on 11/01/2019 at 6:44 PM  Go to [email or web address] - for contact info  [facility] - Office  [phone]

## 2019-11-01 NOTE — Progress Notes (Signed)
Patient O2 sat 100% on Nonrebreather mask 15L. Patients breathing labored and irregular. Patient pale in color. New orders placed by MD. Bed request placed to transfer to AP Stepdown.

## 2019-11-01 NOTE — Progress Notes (Signed)
Pharmacy Antibiotic Note  Shaun May is a 68 y.o. male admitted on 10/30/2019 with asp pna.  Pharmacy has been consulted for Unasyn dosing.  Plan: Unasyn 3gm IV q6h Will f/u renal function, micro data, and pt's clinical condition  Height: 5\' 10"  (177.8 cm) Weight: 200 lb (90.7 kg) IBW/kg (Calculated) : 73  Temp (24hrs), Avg:97.6 F (36.4 C), Min:97.6 F (36.4 C), Max:97.6 F (36.4 C)  Recent Labs  Lab 10/30/19 1302 10/31/19 0452  WBC 9.2  --   CREATININE 1.95* 2.05*    Estimated Creatinine Clearance: 39.6 mL/min (A) (by C-G formula based on SCr of 2.05 mg/dL (H)).    Allergies  Allergen Reactions  . Xarelto [Rivaroxaban] Other (See Comments)    Caused bleeding.   . Eliquis [Apixaban]     Internal bleeding    Antimicrobials this admission: 11/25 Rocephin >>  11/26 Unasyn >>   Microbiology results: 11/24 BCx: ngtd 11/25 SARS coronavirus 2: neg  11/25 MRSA PCR:   Thank you for allowing pharmacy to be a part of this patient's care.  Sherlon Handing, PharmD, BCPS Please see amion for complete clinical pharmacist phone list 11/01/2019 6:23 AM

## 2019-11-01 NOTE — Progress Notes (Signed)
[facility] night shift coverage note.  The patient was seen due to hypoxia in the 70s.  This responded quickly to NRB mask oxygen.  The staff stated that last night he had trouble swallowing a pill and started coughing.  Also, his mental status has decreased according to them as well.  He is currently only oriented to name only. He has had Ativan 2 mg x 2 during the shift.  Most recent vital signs temperature 97.6 F, pulse 60, respiration 18, blood pressure 115/75 mmHg and O2 sat 100% on room air.  He is somnolent, but wakes up and answers his name.  He has some trouble answering simple questions.  HEENT: Normocephalic Neck: supple, no JVD. Lungs: Diminished bilaterally with mild rhonchi. Heart: S1-S2, RRR Abdomen: Distended, soft, nontender. Extremities: Left lower extremity shows areas of aberration with surrounding erythema, edema and calor. Neuro: Somnolent, but grossly nonfocal.  Hypoxia Likely due to aspiration pneumonia. Also had LLL infiltrate/left effusion on 11/24 Chest film. Check CBC, CMP, lactic acid and ammonia level. Check chest radiograph. Transfer to ICU given high oxygen requirement. Start Unasyn.  Continue ceftriaxone for now. If LLL infiltrate not significant, may discontinue ceftriaxone.  Abdominal distension Per staff, abdomen seems bigger and tense now. Consider CT abdomen/pelvis with contrast.  Cellulitis of LLE Currently on ceftriaxone. Should be well covered with Unasyn. Briefly discussed with pharmacy.  EtOH withdrawal Continue CIWA protocol.  Tennis Must, MD.  About 35 minutes of critical care time were used during the process of this emergent event.  This document was prepared using Dragon voice recognition software and may contain some unintended transcription errors.

## 2019-11-01 NOTE — Progress Notes (Signed)
Upon entering room, patient had removed nasal cannula from nose. Patient ashen in color, body cool to touch. Unable to get accurate O2 sat upon entering room. Patients breathing labored and diminished. Respiratory notified. Nonrebreather 15L placed on patient. Upon placement 02 sat 72%. Patient pulling at mask and trying to remove. MD notified. Dr. Olevia Bowens on the floor to assess patient.

## 2019-11-01 NOTE — Progress Notes (Signed)
Pt had to be sternal rubbed to arouse this am. Pt sats 83% on 6L [place]. NRB applied sats >90%. Pt unable to make eye contact and unable to state name. Pt moans.  Lung sounds-crackles. MD notified. MD advised he would contact family.

## 2019-11-01 NOTE — Progress Notes (Signed)
Pt transferred to ICU unit per MD order.  Report given to [facility]

## 2019-11-02 ENCOUNTER — Inpatient Hospital Stay (HOSPITAL_COMMUNITY): Payer: Medicare Other

## 2019-11-02 DIAGNOSIS — A419 Sepsis, unspecified organism: Secondary | ICD-10-CM | POA: Diagnosis present

## 2019-11-02 DIAGNOSIS — I5030 Unspecified diastolic (congestive) heart failure: Secondary | ICD-10-CM

## 2019-11-02 LAB — COMPREHENSIVE METABOLIC PANEL
ALT: 14 U/L (ref 0–44)
AST: 23 U/L (ref 15–41)
Albumin: 2.7 g/dL — ABNORMAL LOW (ref 3.5–5.0)
Alkaline Phosphatase: 65 U/L (ref 38–126)
Anion gap: 10 (ref 5–15)
BUN: 74 mg/dL — ABNORMAL HIGH (ref 8–23)
CO2: 30 mmol/L (ref 22–32)
Calcium: 6.8 mg/dL — ABNORMAL LOW (ref 8.9–10.3)
Chloride: 94 mmol/L — ABNORMAL LOW (ref 98–111)
Creatinine, Ser: 2.58 mg/dL — ABNORMAL HIGH (ref 0.61–1.24)
GFR calc Af Amer: 29 mL/min — ABNORMAL LOW (ref >60)
GFR calc non Af Amer: 25 mL/min — ABNORMAL LOW (ref >60)
Glucose, Bld: 160 mg/dL — ABNORMAL HIGH (ref 70–99)
Potassium: 3.9 mmol/L (ref 3.5–5.1)
Sodium: 134 mmol/L — ABNORMAL LOW (ref 135–145)
Total Bilirubin: 1.4 mg/dL — ABNORMAL HIGH (ref 0.3–1.2)
Total Protein: 6 g/dL — ABNORMAL LOW (ref 6.5–8.1)

## 2019-11-02 LAB — CBC
HCT: 34.3 % — ABNORMAL LOW (ref 39.0–52.0)
Hemoglobin: 10.2 g/dL — ABNORMAL LOW (ref 13.0–17.0)
MCH: 29.6 pg (ref 26.0–34.0)
MCHC: 29.7 g/dL — ABNORMAL LOW (ref 30.0–36.0)
MCV: 99.4 fL (ref 80.0–100.0)
Platelets: 83 10*3/uL — ABNORMAL LOW (ref 150–400)
RBC: 3.45 MIL/uL — ABNORMAL LOW (ref 4.22–5.81)
RDW: 16.4 % — ABNORMAL HIGH (ref 11.5–15.5)
WBC: 4.2 10*3/uL (ref 4.0–10.5)
nRBC: 0.7 % — ABNORMAL HIGH (ref 0.0–0.2)

## 2019-11-02 LAB — GLUCOSE, CAPILLARY
Glucose-Capillary: 148 mg/dL — ABNORMAL HIGH (ref 70–99)
Glucose-Capillary: 144 mg/dL — ABNORMAL HIGH (ref 70–99)
Glucose-Capillary: 97 mg/dL (ref 70–99)

## 2019-11-02 MED ORDER — PANTOPRAZOLE SODIUM 40 MG IV SOLR
40.0000 mg | INTRAVENOUS | Status: DC
  Administered 2019-11-02 – 2019-11-05 (×4): 40 mg via INTRAVENOUS
  Filled 2019-11-02 (×4): qty 40

## 2019-11-02 MED ORDER — MORPHINE SULFATE (PF) 2 MG/ML IV SOLN
2.0000 mg | INTRAVENOUS | Status: AC | PRN
  Administered 2019-11-04: 2 mg via INTRAVENOUS
  Filled 2019-11-02: qty 1

## 2019-11-02 MED ORDER — SODIUM CHLORIDE 0.9 % IV SOLN
1.0000 g | Freq: Two times a day (BID) | INTRAVENOUS | Status: DC
  Administered 2019-11-02 – 2019-11-04 (×6): 1 g via INTRAVENOUS
  Filled 2019-11-02 (×6): qty 1

## 2019-11-02 NOTE — Progress Notes (Signed)
PT Cancellation Note  Patient Details Name: Shaun May MRN: [id number] DOB: 1951/05/03   Cancelled Treatment:    Reason Eval/Treat Not Completed: Medical issues which prohibited therapy.  Patient transferred to a higher level of care and will need new PT consult resume therapy when patient is medically stable.  Thank you.   7:36 AM, 11/02/19 Lonell Grandchild, MPT Physical Therapist with [facility] [phone] office [phone] mobile phone

## 2019-11-02 NOTE — Progress Notes (Signed)
Pharmacy Antibiotic Note  Shaun May is a 68 y.o. male admitted on 10/30/2019 with pneumonia, cellulitis and sepsis.  Pharmacy has been consulted for Meropenem dosing.  Plan: Meropenem 1000 mg IV every 12 hours. Monitor labs, c/s, and patient improvement.  Height: 5\' 10"  (177.8 cm) Weight: 200 lb (90.7 kg) IBW/kg (Calculated) : 73  Temp (24hrs), Avg:96.2 F (35.7 C), Min:94.1 F (34.5 C), Max:97.6 F (36.4 C)  Recent Labs  Lab 10/30/19 1302 10/31/19 0452 11/01/19 0449 11/01/19 0640 11/02/19 0626  WBC 9.2  --   --  11.7* 4.2  CREATININE 1.95* 2.05* 2.41*  --  2.58*  LATICACIDVEN  --   --   --  1.6  --     Estimated Creatinine Clearance: 31.5 mL/min (A) (by C-G formula based on SCr of 2.58 mg/dL (H)).    Allergies  Allergen Reactions  . Xarelto [Rivaroxaban] Other (See Comments)    Caused bleeding.   . Eliquis [Apixaban]     Internal bleeding    Antimicrobials this admission: Merrem 11/27 >>  Unasyn 1126 >> 11/27 CTX 11/25 >> 11/26  Dose adjustments this admission: [place]  Microbiology results: 11/27 BCx: pending 11/24 BCx: ngtd 11/25 MRSA PCR: negative  Thank you for allowing pharmacy to be a part of this patient's care.  Margot Ables, PharmD Clinical Pharmacist 11/02/2019 10:57 AM

## 2019-11-02 NOTE — Progress Notes (Signed)
Patient Demographics:    Shaun May, is a 68 y.o. male, DOB - Feb 09, 1951, LMB:[id number]  Admit date - 10/30/2019   Admitting Physician Neena Rhymes, MD  Outpatient Primary MD for the patient is Celene Squibb, MD  LOS - 3   Chief Complaint  Patient presents with   Recurrent Skin Infections        Subjective:    Shaun May today has no fevers, no emesis,  No chest pain,   - Waking up more on BiPAP -On low-dose Precedex drip remains responsive, overall calm -Girlfriend at bedside - -Had hypothermia resolved with warming blanket   Assessment  & Plan :    Principal Problem:   Sepsis ([place]) Active Problems:   COPD GOLD IV/ 02 dep at hs/ still smoking    Cellulitis of left leg/Wound Infection   Acute on chronic respiratory failure with hypoxia (HCC)   (HFpEF) heart failure with preserved ejection fraction/EF 10-07%/ Chronic Diastolic Dysfunction CHF   Hypertension   Cirrhosis of liver with ascites ([name])   ETOH abuse  Brief Summary:- 68 y.o. 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  Chronic venous insufficiency, history of left leg cellulitis, history of cirrhosis with ascites status post paracentesis in the last month.  Patient lives alone with an intermittent visitation by a girlfriend.  According to the patient's son he is irregular in taking his medications as instructed -Lethargy and respiratory distress on 11/01/2019 transferred to ICU on BiPAP  A/p  1) sepsis secondary to Lt Leg Cellulitis/Wound infection --- open wounds on left lower extremity- with secondary infection-pneumonia may be contributing to patient's septic picture as well -Sepsis pathophysiology exhibited by hypothermia, altered mentation, hypotension and  worsening renal perfusion, leukocytosis appears to be resolving -MRSA PCR Neg -10/30/2019 NGTD -WBC is down to 4.2 from 11.7 -Given hypothermia blood cultures repeated on 11/02/2019 -IV antibiotics switched from Unasyn to meropenem   2)Presumed pneumonia--- may be aspiration related antibiotics as above #1,  -Continue BiPAP for respiratory support as below in #6  3) alcohol abuse--- patient drinks a lot of [name], he is a heavy drinker-- high risk for delirium tremens, IV banana bag as ordered, stop lorazepam per CIWA protocol due to excessive sedation -Patient placed on Precedex drip -Completed banana bag with thiamine and folic acid as ordered  4) generalized weakness and deconditioning--recurrent falls at home -Physical therapy evaluated patient previously recommended SNF rehab  5) COPD and tobacco abuse--continues to smoke despite being on home O2--- continue bronchodilators and BiPAP  6)Acute on chronic chronic hypoxic respiratory failure--- secondary to #5 above, PTA patient used 2 to 3 L of oxygen at home continue oxygen supplementation --Acute on chronic respiratory failure secondary to presumed aspiration event on 10/31/2019 now requiring BiPAP -Chest x-ray on 11/01/2019 and 11/02/2019 Noted -CHF versus pneumonia  -Clinically suspect aspiration pneumonia Antibiotics as above in #1  7)alcoholic liver cirrhosis--requiring recurrent paracenteses -Unable to give torsemide due to respiratory distress requiring continuous BiPAP as well as soft BP -Ammonia noted at 68  8)HFpEF--- last known EF 60 to 65%, patient with stable chronic diastolic CHF/nonischemic cardiomyopathy, -  unable to adequately diurese due to soft BP  8)Social/ethics--patient technically lives alone , but his girlfriend has been technically living with him lately due to weakness and recurrent falls -Patient and girlfriend drink alcohol together--- patient is a full code  --I called and updated patient's son  (lives in [place]), his significant other and his brother by phone, questions answered -Patient is a full code as per family -Waking up a bit more with BiPAP  9) history of complete heart block--- status post permanent pacemaker placement since 2016, patient follows with Dr. Lovena Le  10)Persistent atrial fibrillation/flutter--- unable to give Coreg due to soft BP, not a candidate for anticoagulation due to recurrent GI bleed and hematuria in an alcoholic male with recurrent falls  11) acute metabolic encephalopathy--multifactorial suspect some component of DTs, also some component of hepatic encephalopathy with elevated ammonia, and CO2 narcosis/hypercapnia in the setting of acute on chronic hypoxic and hypercapnic respiratory failure -Treat underlying etiologies and metabolic encephalopathy should improve  12)AKI----acute kidney injury on CKD stage III-     creatinine on admission=1.95  ,   baseline creatinine =2    , creatinine is now=2.58      , renally adjust medications, avoid nephrotoxic agents/dehydration/hypotension   -Worsening renal function due to transient hypotension and decreased oral intake in the setting of encephalopathy and infection  Disposition/Need for in-Hospital Stay- patient unable to be discharged at this time due to -severe hypoxic and hypercapnic respiratory distress requiring continuous BiPAP -Pulmonary status and mentation improving very slowly  Code Status : Full code  Family Communication:    Male Friend at bedside -Discussed with patient's son who lives in [place] also discussed with patient's brother  Disposition Plan  : To be determined  Consults  :  na  DVT Prophylaxis  :  Lovenox -  - SCDs   Lab Results  Component Value Date   PLT 83 (L) 11/02/2019   Inpatient Medications  Scheduled Meds:  chlorhexidine  15 mL Mouth Rinse BID   Chlorhexidine Gluconate Cloth  6 each Topical Daily   enoxaparin (LOVENOX) injection  40 mg  Subcutaneous Q24H   fluticasone furoate-vilanterol  1 puff Inhalation Daily   And   umeclidinium bromide  1 puff Inhalation Daily   ipratropium-albuterol  3 mL Nebulization Q6H   mouth rinse  15 mL Mouth Rinse q12n4p   methylPREDNISolone (SOLU-MEDROL) injection  125 mg Intravenous Once   methylPREDNISolone (SOLU-MEDROL) injection  40 mg Intravenous Q8H   pantoprazole (PROTONIX) IV  40 mg Intravenous Q24H   sodium chloride flush  3 mL Intravenous Q12H   Continuous Infusions:  sodium chloride     dexmedetomidine (PRECEDEX) IV infusion 0.5 mcg/kg/hr (11/02/19 1747)   meropenem (MERREM) IV Stopped (11/02/19 1218)   PRN Meds:.sodium chloride, acetaminophen **OR** acetaminophen, albuterol, morphine injection, sodium chloride flush  Anti-infectives (From admission, onward)   Start     Dose/Rate Route Frequency Ordered Stop   11/02/19 1200  meropenem (MERREM) 1 g in sodium chloride 0.9 % 100 mL IVPB     1 g 200 mL/hr over 30 Minutes Intravenous Every 12 hours 11/02/19 1044     11/01/19 0900  Ampicillin-Sulbactam (UNASYN) 3 g in sodium chloride 0.9 % 100 mL IVPB  Status:  Discontinued     3 g 200 mL/hr over 30 Minutes Intravenous Every 8 hours 11/01/19 0843 11/02/19 1023   11/01/19 0630  Ampicillin-Sulbactam (UNASYN) 3 g in sodium chloride 0.9 % 100 mL IVPB  Status:  Discontinued     3 g 200 mL/hr over 30 Minutes Intravenous Every 6 hours 11/01/19 0629 11/01/19 0843   10/31/19 0900  cefTRIAXone (ROCEPHIN) 1 g in sodium chloride 0.9 % 100 mL IVPB  Status:  Discontinued     1 g 200 mL/hr over 30 Minutes Intravenous Every 24 hours 10/31/19 0848 11/01/19 0807        Objective:   Vitals:   11/02/19 1600 11/02/19 1651 11/02/19 1800 11/02/19 1835  BP: 98/63  95/61   Pulse: 61  76   Resp: (!) 23  (!) 26   Temp:  99.5 F (37.5 C)  99.8 F (37.7 C)  TempSrc:  Rectal  Rectal  SpO2: 100%  95%   Weight:      Height:        Wt Readings from Last 3 Encounters:  10/30/19 90.7 kg   10/05/19 91.6 kg  09/06/19 91.6 kg    Intake/Output Summary (Last 24 hours) at 11/02/2019 2026 Last data filed at 11/02/2019 1835 Gross per 24 hour  Intake 934.67 ml  Output 1250 ml  Net -315.33 ml   Physical Exam Gen:-Initially very very lethargic, waking up more with BiPAP HEENT:- [place].AT, No sclera icterus Nose/Mouth--BiPAP mask Neck-Supple Neck,No JVD,.  Lungs-diminished breath sounds with scattered rhonchi and wheezes  CV- S1, S2 normal, irregular  Abd-  +ve B.Sounds, Abd Soft, No tenderness,    Extremity/Skin:-  , pedal pulses present , left leg with areas of excoriation/abrasion open wounds with secondary erythema, swelling and warmth and tenderness consistent with cellulitis secondary wound infection Psych-lethargic, initially confused and disoriented more awake now after BiPAP use  neuro-disoriented and confused, neuro exam limited   Data Review:   Micro Results Recent Results (from the past 240 hour(s))  Culture, body fluid-bottle     Status: None   Collection Time: 10/24/19  3:00 PM   Specimen: Ascitic  Result Value Ref Range Status   Specimen Description ASCITIC  Final   Special Requests 10CC  Final   Culture   Final    NO GROWTH 3 DAYS Performed at [facility], [address], [place], [place] [address]    Report Status 10/28/2019 FINAL  Final  Gram stain     Status: None   Collection Time: 10/24/19  3:09 PM   Specimen: Ascitic; Body Fluid  Result Value Ref Range Status   Specimen Description ASCITIC  Final   Special Requests NONE  Final   Gram Stain   Final    NO ORGANISMS SEEN WBC PRESENT, PREDOMINANTLY PMN CYTOSPIN SMEAR Performed at [facility], [address] [place], [place] [address]    Report Status 10/24/2019 FINAL  Final  Culture, blood (single)     Status: None (Preliminary result)   Collection Time: 10/30/19  7:08 PM   Specimen: Right Antecubital; Blood  Result Value Ref Range Status   Specimen Description RIGHT ANTECUBITAL  Final    Special Requests   Final    BOTTLES DRAWN AEROBIC AND ANAEROBIC Blood Culture adequate volume   Culture   Final    NO GROWTH 3 DAYS Performed at [facility], [address] [place], [place] [address]    Report Status PENDING  Incomplete  SARS CORONAVIRUS 2 (TAT 6-24 HRS) Nasopharyngeal Nasopharyngeal Swab     Status: None   Collection Time: 10/31/19  7:01 AM   Specimen: Nasopharyngeal Swab  Result Value Ref Range Status   SARS Coronavirus 2 NEGATIVE NEGATIVE Final    Comment: (  NOTE) SARS-CoV-2 target nucleic acids are NOT DETECTED. The SARS-CoV-2 RNA is generally detectable in upper and lower respiratory specimens during the acute phase of infection. Negative results do not preclude SARS-CoV-2 infection, do not rule out co-infections with other pathogens, and should not be used as the sole basis for treatment or other patient management decisions. Negative results must be combined with clinical observations, patient history, and epidemiological information. The expected result is Negative. Fact Sheet for Patients: SugarRoll.be Fact Sheet for Healthcare Providers: https://www.woods-mathews.com/ This test is not yet approved or cleared by the [place] FDA and  has been authorized for detection and/or diagnosis of SARS-CoV-2 by FDA under an Emergency Use Authorization (EUA). This EUA will remain  in effect (meaning this test can be used) for the duration of the COVID-19 declaration under Section 56 4(b)(1) of the Act, 21 U.S.C. section 360bbb-3(b)(1), unless the authorization is terminated or revoked sooner. Performed at [facility], [place] [address], [place], [place] [address]   MRSA PCR Screening     Status: None   Collection Time: 10/31/19  5:59 PM   Specimen: Nasal Mucosa; Nasopharyngeal  Result Value Ref Range Status   MRSA by PCR NEGATIVE NEGATIVE Final    Comment:        The GeneXpert MRSA Assay (FDA approved for NASAL  specimens only), is one component of a comprehensive MRSA colonization surveillance program. It is not intended to diagnose MRSA infection nor to guide or monitor treatment for MRSA infections. Performed at [facility], [address], [place], [place] [address]   Culture, blood (Routine X 2) w Reflex to ID Panel     Status: None (Preliminary result)   Collection Time: 11/02/19  9:16 AM   Specimen: BLOOD LEFT HAND  Result Value Ref Range Status   Specimen Description   Final    BLOOD LEFT HAND BOTTLES DRAWN AEROBIC AND ANAEROBIC   Special Requests   Final    Blood Culture adequate volume Performed at [facility], [address], [place], [place] [address]    Culture PENDING  Incomplete   Report Status PENDING  Incomplete  Culture, blood (Routine X 2) w Reflex to ID Panel     Status: None (Preliminary result)   Collection Time: 11/02/19 11:09 AM   Specimen: BLOOD  Result Value Ref Range Status   Specimen Description BLOOD BLOOD LEFT WRIST  Final   Special Requests   Final    BOTTLES DRAWN AEROBIC AND ANAEROBIC Blood Culture adequate volume Performed at [facility], [address] [place], [place] [address]    Culture PENDING  Incomplete   Report Status PENDING  Incomplete    Radiology Reports Dg Chest 2 View  Result Date: 10/30/2019 CLINICAL DATA:  Lower extremity redness, swelling and increased fluid retention, more confusion evening, multiple falls this past weekend, laid on floor for hours, history smoking, pacemaker, atrial fibrillation, COPD, chronic kidney disease, hypertension, CHF, RIGHT upper lobe lung cancer EXAM: CHEST - 2 VIEW COMPARISON:  08/10/2019 Correlation: CT angio chest 05/17/2018 FINDINGS: LEFT subclavian sequential transvenous pacemaker leads project at RIGHT atrium and RIGHT ventricle. Normal heart size, mediastinal contours, and pulmonary vascularity. Atherosclerotic calcification aorta. Scarring in RIGHT upper lobe with associated stellate opacity,  question post radiation therapy change; this has increased since the previous study. Atelectasis versus consolidation LEFT lower lobe. Remaining lungs clear. Small LEFT and tiny RIGHT pleural effusions. No pneumothorax or acute osseous findings. IMPRESSION: Atelectasis versus consolidation LEFT lower lobe with associated  LEFT pleural effusion. Increased delayed opacity in the RIGHT upper lobe question progressive post radiation therapy changes. Tiny RIGHT pleural effusion. Aortic Atherosclerosis (ICD10-I70.0). Electronically Signed   By: Lavonia Dana M.D.   On: 10/30/2019 14:58   US Venous Img Lower Unilateral Left (dvt)  Result Date: 10/30/2019 CLINICAL DATA:  68 year old male with left lower extremity redness and swelling for 1 week EXAM: LEFT LOWER EXTREMITY VENOUS DOPPLER ULTRASOUND TECHNIQUE: Gray-scale sonography with graded compression, as well as color Doppler and duplex ultrasound were performed to evaluate the lower extremity deep venous systems from the level of the common femoral vein and including the common femoral, femoral, profunda femoral, popliteal and calf veins including the posterior tibial, peroneal and gastrocnemius veins when visible. The superficial great saphenous vein was also interrogated. Spectral Doppler was utilized to evaluate flow at rest and with distal augmentation maneuvers in the common femoral, femoral and popliteal veins. COMPARISON:  None. FINDINGS: Contralateral Common Femoral Vein: Respiratory phasicity is normal and symmetric with the symptomatic side. No evidence of thrombus. Normal compressibility. Common Femoral Vein: No evidence of thrombus. Normal compressibility, respiratory phasicity and response to augmentation. Saphenofemoral Junction: No evidence of thrombus. Normal compressibility and flow on color Doppler imaging. Profunda Femoral Vein: No evidence of thrombus. Normal compressibility and flow on color Doppler imaging. Femoral Vein: No evidence of thrombus.  Normal compressibility, respiratory phasicity and response to augmentation. Popliteal Vein: No evidence of thrombus. Normal compressibility, respiratory phasicity and response to augmentation. Calf Veins: No evidence of thrombus. Normal compressibility and flow on color Doppler imaging. Superficial Great Saphenous Vein: No evidence of thrombus. Normal compressibility. Venous Reflux:  None. Other Findings:  None. IMPRESSION: No evidence of deep venous thrombosis. Electronically Signed   By: Jacqulynn Cadet M.D.   On: 10/30/2019 14:45   US Paracentesis  Result Date: 10/24/2019 INDICATION: Cirrhosis, ascites EXAM: ULTRASOUND GUIDED DIAGNOSTIC AND THERAPEUTIC PARACENTESIS MEDICATIONS: NONE COMPLICATIONS: NONE IMMEDIATE PROCEDURE: Informed written consent was obtained from the patient after a discussion of the risks, benefits and alternatives to treatment. A timeout was performed prior to the initiation of the procedure. Initial ultrasound scanning demonstrates a moderate amount of ascites within the LEFT lower abdominal quadrant. The right lower abdomen was prepped and draped in the usual sterile fashion. 1% lidocaine was used for local anesthesia. Following this, a 5 [place] Yueh catheter was introduced. An ultrasound image was saved for documentation purposes. The paracentesis was performed. The catheter was removed and a dressing was applied. The patient tolerated the procedure well without immediate post procedural complication. Patient received post-procedure intravenous albumin; see nursing notes for details. FINDINGS: A total of approximately 2.9 L of amber colored ascitic fluid was removed. Samples were sent to the laboratory as requested by the clinical team. IMPRESSION: Successful ultrasound-guided paracentesis yielding 2.9 liters of peritoneal fluid. Electronically Signed   By: Lavonia Dana M.D.   On: 10/24/2019 16:07   Dg Chest Port 1 View  Result Date: 11/02/2019 CLINICAL DATA:  Respiratory  failure EXAM: PORTABLE CHEST 1 VIEW COMPARISON:  Radiograph 11/01/2019 FINDINGS: Cardiac pacer pack overlies the left chest wall. Pacer lead positioning is unchanged from prior. Surgical clips in the right upper lobe and a spiculated mass lesion in the right upper lung are similar prior accounting for differences in positioning. There is slightly increasing bilateral airspace disease most pronounced to the infrahilar lungs. Cardiomediastinal contours including a calcified aorta are stable from prior. No pre or paravertebral fluid or swelling. No visible canal hematoma. IMPRESSION: 1.  Slightly increasing bilateral airspace disease most pronounced in the infrahilar lungs. Possibly worsening edema or infection. 2. Stable postoperative changes and spiculated mass in the right upper lobe. 3. Prior CABG. 4.  Aortic Atherosclerosis (ICD10-I70.0). Electronically Signed   By: Lovena Le M.D.   On: 11/02/2019 07:02   Dg Chest Port 1 View  Result Date: 11/01/2019 CLINICAL DATA:  Hypoxia. EXAM: PORTABLE CHEST 1 VIEW COMPARISON:  10/30/2019 FINDINGS: Dual lead pacemaker unchanged. Heart size upper normal. Vascular congestion has progressed. Progressive left lower lobe airspace disease and small left effusion. No effusion on the right. Surgical clips in the right upper lobe with spiculated mass lesion unchanged from prior study. IMPRESSION: Progression of vascular congestion. Progressive left lower lobe airspace disease and left effusion. Findings suggestive of fluid overload however pneumonia in the left lower lobe is possible. Electronically Signed   By: Franchot Gallo M.D.   On: 11/01/2019 06:50     CBC Recent Labs  Lab 10/30/19 1302 11/01/19 0640 11/02/19 0626  WBC 9.2 11.7* 4.2  HGB 10.5* 11.0* 10.2*  HCT 35.2* 38.2* 34.3*  PLT 132* 141* 83*  MCV 98.9 101.1* 99.4  MCH 29.5 29.1 29.6  MCHC 29.8* 28.8* 29.7*  RDW 16.8* 16.6* 16.4*  LYMPHSABS 0.2* 0.2*  --   MONOABS 0.2 0.4  --   EOSABS 0.0 0.0  --     BASOSABS 0.0 0.0  --     Chemistries  Recent Labs  Lab 10/30/19 1302 10/31/19 0452 11/01/19 0449 11/01/19 0640 11/02/19 0626  NA 132* 134* 133*  --  134*  K 3.1* 3.6 3.6  --  3.9  CL 87* 87* 88*  --  94*  CO2 32 31 31  --  30  GLUCOSE 90 116* 164*  --  160*  BUN 62* 64* 71*  --  74*  CREATININE 1.95* 2.05* 2.41*  --  2.58*  CALCIUM 7.0* 7.3* 7.2*  --  6.8*  MG  --   --   --  1.4*  --   AST 34  --  28  --  23  ALT 18  --  15  --  14  ALKPHOS 86  --  76  --  65  BILITOT 2.0*  --  1.5*  --  1.4*   ------------------------------------------------------------------------------------------------------------------ No results for input(s): CHOL, HDL, LDLCALC, TRIG, CHOLHDL, LDLDIRECT in the last 72 hours.  No results found for: HGBA1C ------------------------------------------------------------------------------------------------------------------ No results for input(s): TSH, T4TOTAL, T3FREE, THYROIDAB in the last 72 hours.  Invalid input(s): FREET3 ------------------------------------------------------------------------------------------------------------------ No results for input(s): VITAMINB12, FOLATE, FERRITIN, TIBC, IRON, RETICCTPCT in the last 72 hours.  Coagulation profile Recent Labs  Lab 10/30/19 1302  INR 1.4*    No results for input(s): DDIMER in the last 72 hours.  Cardiac Enzymes No results for input(s): CKMB, TROPONINI, MYOGLOBIN in the last 168 hours.  Invalid input(s): CK ------------------------------------------------------------------------------------------------------------------    Component Value Date/Time   BNP 601.0 (H) 08/10/2019 9379     Roxan Hockey M.D on 11/02/2019 at 8:26 PM  Go to [email or web address] - for contact info  [facility] - Office  [phone]

## 2019-11-02 NOTE — Progress Notes (Signed)
Rectal temp noted to be 94.1, Dr. Joesph Fillers aware. Bairre hugger applied

## 2019-11-03 DIAGNOSIS — J9621 Acute and chronic respiratory failure with hypoxia: Secondary | ICD-10-CM

## 2019-11-03 DIAGNOSIS — I5032 Chronic diastolic (congestive) heart failure: Secondary | ICD-10-CM

## 2019-11-03 DIAGNOSIS — I483 Typical atrial flutter: Secondary | ICD-10-CM

## 2019-11-03 DIAGNOSIS — D696 Thrombocytopenia, unspecified: Secondary | ICD-10-CM

## 2019-11-03 DIAGNOSIS — F1010 Alcohol abuse, uncomplicated: Secondary | ICD-10-CM

## 2019-11-03 LAB — GLUCOSE, CAPILLARY
Glucose-Capillary: 94 mg/dL (ref 70–99)
Glucose-Capillary: 91 mg/dL (ref 70–99)
Glucose-Capillary: 104 mg/dL — ABNORMAL HIGH (ref 70–99)
Glucose-Capillary: 89 mg/dL (ref 70–99)
Glucose-Capillary: 112 mg/dL — ABNORMAL HIGH (ref 70–99)

## 2019-11-03 LAB — CBC
HCT: 34.9 % — ABNORMAL LOW (ref 39.0–52.0)
Hemoglobin: 10.7 g/dL — ABNORMAL LOW (ref 13.0–17.0)
MCH: 29.6 pg (ref 26.0–34.0)
MCHC: 30.7 g/dL (ref 30.0–36.0)
MCV: 96.7 fL (ref 80.0–100.0)
Platelets: 89 10*3/uL — ABNORMAL LOW (ref 150–400)
RBC: 3.61 MIL/uL — ABNORMAL LOW (ref 4.22–5.81)
RDW: 16 % — ABNORMAL HIGH (ref 11.5–15.5)
WBC: 10.9 10*3/uL — ABNORMAL HIGH (ref 4.0–10.5)
nRBC: 0.2 % (ref 0.0–0.2)

## 2019-11-03 LAB — BASIC METABOLIC PANEL
Anion gap: 13 (ref 5–15)
BUN: 76 mg/dL — ABNORMAL HIGH (ref 8–23)
CO2: 33 mmol/L — ABNORMAL HIGH (ref 22–32)
Calcium: 7.2 mg/dL — ABNORMAL LOW (ref 8.9–10.3)
Chloride: 92 mmol/L — ABNORMAL LOW (ref 98–111)
Creatinine, Ser: 2.57 mg/dL — ABNORMAL HIGH (ref 0.61–1.24)
GFR calc Af Amer: 29 mL/min — ABNORMAL LOW (ref >60)
GFR calc non Af Amer: 25 mL/min — ABNORMAL LOW (ref >60)
Glucose, Bld: 103 mg/dL — ABNORMAL HIGH (ref 70–99)
Potassium: 3.5 mmol/L (ref 3.5–5.1)
Sodium: 138 mmol/L (ref 135–145)

## 2019-11-03 MED ORDER — GUAIFENESIN 100 MG/5ML PO SOLN
15.0000 mL | Freq: Three times a day (TID) | ORAL | Status: DC
  Administered 2019-11-03 – 2019-11-07 (×13): 300 mg via ORAL
  Filled 2019-11-03: qty 15
  Filled 2019-11-03: qty 5
  Filled 2019-11-03: qty 15
  Filled 2019-11-03: qty 5
  Filled 2019-11-03: qty 15
  Filled 2019-11-03: qty 5
  Filled 2019-11-03: qty 15
  Filled 2019-11-03: qty 10
  Filled 2019-11-03: qty 15
  Filled 2019-11-03: qty 5
  Filled 2019-11-03: qty 10
  Filled 2019-11-03: qty 5
  Filled 2019-11-03: qty 15
  Filled 2019-11-03: qty 5
  Filled 2019-11-03: qty 25

## 2019-11-03 MED ORDER — LACTULOSE 10 GM/15ML PO SOLN
30.0000 g | Freq: Two times a day (BID) | ORAL | Status: DC
  Administered 2019-11-03 – 2019-11-05 (×5): 30 g via ORAL
  Filled 2019-11-03 (×6): qty 60

## 2019-11-03 MED ORDER — LACTATED RINGERS IV SOLN
INTRAVENOUS | Status: AC
  Administered 2019-11-03: 13:00:00 via INTRAVENOUS

## 2019-11-03 NOTE — Progress Notes (Addendum)
Patient Demographics:    Grove Defina, is a 68 y.o. male, DOB - March 31, 1951, TTS:[id number]  Admit date - 10/30/2019   Admitting Physician Neena Rhymes, MD  Outpatient Primary MD for the patient is Celene Squibb, MD  LOS - 4   Chief Complaint  Patient presents with   Recurrent Skin Infections        Subjective:    Ezekiah Massie was taken off BiPAP earlier this morning.  He is awake and sitting up in bed.  Feels thirsty.  Denies any pain.   Assessment  & Plan :    Principal Problem:   Sepsis ([place]) Active Problems:   Hypertension   COPD GOLD IV/ 02 dep at hs/ still smoking    Atrial flutter (HCC), paroxysmal   Cirrhosis of liver with ascites (HCC)   Cellulitis of left leg/Wound Infection   ETOH abuse   Acute on chronic respiratory failure with hypoxia (HCC)   (HFpEF) heart failure with preserved ejection fraction/EF 09-23%/ Chronic Diastolic Dysfunction CHF   Thrombocytopenia ([place])  Brief Summary:- 68 y.o. 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  Chronic venous insufficiency, history of left leg cellulitis, history of cirrhosis with ascites status post paracentesis in the last month.  Patient lives alone with an intermittent visitation by a girlfriend.  According to the patient's son he is irregular in taking his medications as instructed -Lethargy and respiratory distress on 11/01/2019 transferred to ICU on BiPAP  A/p  1) sepsis secondary to Lt Leg Cellulitis/Wound infection --- open wounds on left lower extremity- with secondary infection-pneumonia may be contributing to patient's septic picture as well -Sepsis pathophysiology exhibited by hypothermia, altered mentation, hypotension and worsening renal perfusion, leukocytosis appears  to be resolving -MRSA PCR Neg -10/30/2019 NGTD -WBC is down to 10.9 from 11.7, but patient is also on steroids -Given hypothermia blood cultures repeated on 11/02/2019, but have shown no growth -IV antibiotics switched from Unasyn to meropenem   2)Presumed pneumonia--- may be aspiration related antibiotics as above #1,  -He has been weaned off of BiPAP and is back on nasal cannula -Continue intravenous antibiotics for now  3) alcohol withdrawal with delirium tremens.   -Patient placed on Precedex drip with improvement of agitation. -Wean off Precedex as tolerated. -Completed banana bag with thiamine and folic acid as ordered  4) generalized weakness and deconditioning--recurrent falls at home -Physical therapy evaluated patient previously recommended SNF rehab  5) COPD and tobacco abuse--continues to smoke despite being on home O2--- continue bronchodilators and steroids, antibiotics  6)Acute on chronic respiratory failure with hypoxia and hypercapnia --- secondary to #5 above, PTA patient used 2 to 3 L of oxygen at home continue oxygen supplementation --Acute on chronic respiratory failure secondary to presumed aspiration event on 10/31/2019 required BiPAP -Chest x-ray on 11/01/2019 and 11/02/2019 Noted -CHF versus pneumonia  -Clinically suspect aspiration pneumonia Antibiotics as above in #1 -As of 11/28, has been weaned off of BiPAP and appears to be clinically improving from a mental status standpoint.  He is back on 2 to 3 L of oxygen.  7)alcoholic liver cirrhosis--requiring recurrent paracenteses -Unable  to give torsemide due to respiratory distress requiring continuous BiPAP as well as soft BP -Ammonia noted at 68, will repeat in a.m.  Started on lactulose  8)HFpEF--- last known EF 60 to 65%, patient with stable chronic diastolic CHF/nonischemic cardiomyopathy, -appears compensated at this time.  With elevated BUN/creatinine, will provide gentle hydration and follow renal  function.  Monitor respiratory status.  8)Social/ethics--patient technically lives alone , but his girlfriend has been technically living with him lately due to weakness and recurrent falls -Patient and girlfriend drink alcohol together--- patient is a full code  --I called and updated patient's son (lives in [place]), his significant other , questions answered -Patient is a full code as per family  9) history of complete heart block--- status post permanent pacemaker placement since 2016, patient follows with Dr. Lovena Le  10)Persistent atrial fibrillation/flutter--- unable to give Coreg due to soft BP, not a candidate for anticoagulation due to recurrent GI bleed and hematuria in an alcoholic male with recurrent falls  11) acute metabolic encephalopathy--multifactorial suspect some component of DTs, also some component of hepatic encephalopathy with elevated ammonia, and CO2 narcosis/hypercapnia in the setting of acute on chronic hypoxic and hypercapnic respiratory failure -Treat underlying etiologies and metabolic encephalopathy should improve -Overall mental status is improving  12)AKI----acute kidney injury on CKD stage III-     creatinine on admission=1.95  ,   baseline creatinine =1.1 in 08/2019, creatinine is now=2.57, renally adjust medications, avoid nephrotoxic agents/dehydration/hypotension   -Provide gentle hydration overnight and follow renal function.  He appears to be having fair urine output.  13) thrombocytopenia.  Possibly related to alcohol use.  Continue to follow.  No signs of bleeding at this time.  Disposition/Need for in-Hospital Stay-patient needs continued inpatient treatment for alcohol withdrawal since he still requiring Precedex infusion.  As he improves, will need to get physical therapy evaluation to determine discharge disposition. -Pulmonary status and mentation improving very slowly  Code Status : Full code  Family Communication:    Male Friend at  bedside -Discussed with patient's son who lives in [place]  Disposition Plan  : To be determined  Consults  :  na  DVT Prophylaxis  :  Lovenox -  - SCDs   Lab Results  Component Value Date   PLT 89 (L) 11/03/2019   Inpatient Medications  Scheduled Meds:  chlorhexidine  15 mL Mouth Rinse BID   Chlorhexidine Gluconate Cloth  6 each Topical Daily   enoxaparin (LOVENOX) injection  40 mg Subcutaneous Q24H   fluticasone furoate-vilanterol  1 puff Inhalation Daily   And   umeclidinium bromide  1 puff Inhalation Daily   guaiFENesin  15 mL Oral TID   ipratropium-albuterol  3 mL Nebulization Q6H   lactulose  30 g Oral BID   mouth rinse  15 mL Mouth Rinse q12n4p   methylPREDNISolone (SOLU-MEDROL) injection  125 mg Intravenous Once   methylPREDNISolone (SOLU-MEDROL) injection  40 mg Intravenous Q8H   pantoprazole (PROTONIX) IV  40 mg Intravenous Q24H   sodium chloride flush  3 mL Intravenous Q12H   Continuous Infusions:  sodium chloride     dexmedetomidine (PRECEDEX) IV infusion 0.7 mcg/kg/hr (11/03/19 1628)   lactated ringers 75 mL/hr at 11/03/19 1519   meropenem (MERREM) IV Stopped (11/03/19 1225)   PRN Meds:.sodium chloride, acetaminophen **OR** acetaminophen, albuterol, morphine injection, sodium chloride flush  Anti-infectives (From admission, onward)   Start     Dose/Rate Route Frequency Ordered Stop   11/02/19 1200  meropenem (  MERREM) 1 g in sodium chloride 0.9 % 100 mL IVPB     1 g 200 mL/hr over 30 Minutes Intravenous Every 12 hours 11/02/19 1044     11/01/19 0900  Ampicillin-Sulbactam (UNASYN) 3 g in sodium chloride 0.9 % 100 mL IVPB  Status:  Discontinued     3 g 200 mL/hr over 30 Minutes Intravenous Every 8 hours 11/01/19 0843 11/02/19 1023   11/01/19 0630  Ampicillin-Sulbactam (UNASYN) 3 g in sodium chloride 0.9 % 100 mL IVPB  Status:  Discontinued     3 g 200 mL/hr over 30 Minutes Intravenous Every 6 hours 11/01/19 0629 11/01/19 0843    10/31/19 0900  cefTRIAXone (ROCEPHIN) 1 g in sodium chloride 0.9 % 100 mL IVPB  Status:  Discontinued     1 g 200 mL/hr over 30 Minutes Intravenous Every 24 hours 10/31/19 0848 11/01/19 0807        Objective:   Vitals:   11/03/19 1500 11/03/19 1530 11/03/19 1546 11/03/19 1600  BP: 110/74 116/74  118/74  Pulse: (!) 58 60  (!) 58  Resp: 20 16  16   Temp:   (!) 97.5 F (36.4 C)   TempSrc:   Oral   SpO2: 99% 97%  98%  Weight:      Height:        Wt Readings from Last 3 Encounters:  10/30/19 90.7 kg  10/05/19 91.6 kg  09/06/19 91.6 kg    Intake/Output Summary (Last 24 hours) at 11/03/2019 1717 Last data filed at 11/03/2019 1519 Gross per 24 hour  Intake 1076.78 ml  Output 1250 ml  Net -173.22 ml   Physical Exam General exam: Alert, awake, no distress Respiratory system: Diminished breath sounds at bases. Respiratory effort normal. Cardiovascular system:RRR. No murmurs, rubs, gallops. Gastrointestinal system: Abdomen is distended, soft and nontender. No organomegaly or masses felt. Normal bowel sounds heard. Central nervous system:No focal neurological deficits. Extremities: Trace edema bilaterally Skin: Left leg areas of erythema appear to be improving Psychiatry: Limited exam, mild confusion, he is not agitated    Data Review:   Micro Results Recent Results (from the past 240 hour(s))  Culture, blood (single)     Status: None (Preliminary result)   Collection Time: 10/30/19  7:08 PM   Specimen: Right Antecubital; Blood  Result Value Ref Range Status   Specimen Description RIGHT ANTECUBITAL  Final   Special Requests   Final    BOTTLES DRAWN AEROBIC AND ANAEROBIC Blood Culture adequate volume   Culture   Final    NO GROWTH 4 DAYS Performed at [facility], [address], [place], [place] [address]    Report Status PENDING  Incomplete  SARS CORONAVIRUS 2 (TAT 6-24 HRS) Nasopharyngeal Nasopharyngeal Swab     Status: None   Collection Time: 10/31/19  7:01 AM    Specimen: Nasopharyngeal Swab  Result Value Ref Range Status   SARS Coronavirus 2 NEGATIVE NEGATIVE Final    Comment: (NOTE) SARS-CoV-2 target nucleic acids are NOT DETECTED. The SARS-CoV-2 RNA is generally detectable in upper and lower respiratory specimens during the acute phase of infection. Negative results do not preclude SARS-CoV-2 infection, do not rule out co-infections with other pathogens, and should not be used as the sole basis for treatment or other patient management decisions. Negative results must be combined with clinical observations, patient history, and epidemiological information. The expected result is Negative. Fact Sheet for Patients: SugarRoll.be Fact Sheet for Healthcare Providers: https://www.woods-mathews.com/ This test is not yet approved or  cleared by the [place] and  has been authorized for detection and/or diagnosis of SARS-CoV-2 by FDA under an Emergency Use Authorization (EUA). This EUA will remain  in effect (meaning this test can be used) for the duration of the COVID-19 declaration under Section 56 4(b)(1) of the Act, 21 U.S.C. section 360bbb-3(b)(1), unless the authorization is terminated or revoked sooner. Performed at [facility], [place] [address] [place], [place] [address]   MRSA PCR Screening     Status: None   Collection Time: 10/31/19  5:59 PM   Specimen: Nasal Mucosa; Nasopharyngeal  Result Value Ref Range Status   MRSA by PCR NEGATIVE NEGATIVE Final    Comment:        The GeneXpert MRSA Assay (FDA approved for NASAL specimens only), is one component of a comprehensive MRSA colonization surveillance program. It is not intended to diagnose MRSA infection nor to guide or monitor treatment for MRSA infections. Performed at [facility], [address] [place], [place] [address]   Culture, blood (Routine X 2) w Reflex to ID Panel     Status: None (Preliminary result)    Collection Time: 11/02/19  9:16 AM   Specimen: BLOOD LEFT HAND  Result Value Ref Range Status   Specimen Description   Final    BLOOD LEFT HAND BOTTLES DRAWN AEROBIC AND ANAEROBIC   Special Requests Blood Culture adequate volume  Final   Culture   Final    NO GROWTH < 24 HOURS Performed at [facility], [address], [place], [place] [address]    Report Status PENDING  Incomplete  Culture, blood (Routine X 2) w Reflex to ID Panel     Status: None (Preliminary result)   Collection Time: 11/02/19 11:09 AM   Specimen: BLOOD  Result Value Ref Range Status   Specimen Description BLOOD BLOOD LEFT WRIST  Final   Special Requests   Final    BOTTLES DRAWN AEROBIC AND ANAEROBIC Blood Culture adequate volume   Culture   Final    NO GROWTH < 24 HOURS Performed at [facility], [address], [place], [place] [address]    Report Status PENDING  Incomplete    Radiology Reports Dg Chest 2 View  Result Date: 10/30/2019 CLINICAL DATA:  Lower extremity redness, swelling and increased fluid retention, more confusion evening, multiple falls this past weekend, laid on floor for hours, history smoking, pacemaker, atrial fibrillation, COPD, chronic kidney disease, hypertension, CHF, RIGHT upper lobe lung cancer EXAM: CHEST - 2 VIEW COMPARISON:  08/10/2019 Correlation: CT angio chest 05/17/2018 FINDINGS: LEFT subclavian sequential transvenous pacemaker leads project at RIGHT atrium and RIGHT ventricle. Normal heart size, mediastinal contours, and pulmonary vascularity. Atherosclerotic calcification aorta. Scarring in RIGHT upper lobe with associated stellate opacity, question post radiation therapy change; this has increased since the previous study. Atelectasis versus consolidation LEFT lower lobe. Remaining lungs clear. Small LEFT and tiny RIGHT pleural effusions. No pneumothorax or acute osseous findings. IMPRESSION: Atelectasis versus consolidation LEFT lower lobe with associated LEFT pleural effusion.  Increased delayed opacity in the RIGHT upper lobe question progressive post radiation therapy changes. Tiny RIGHT pleural effusion. Aortic Atherosclerosis (ICD10-I70.0). Electronically Signed   By: Lavonia Dana M.D.   On: 10/30/2019 14:58   US Venous Img Lower Unilateral Left (dvt)  Result Date: 10/30/2019 CLINICAL DATA:  68 year old male with left lower extremity redness and swelling for 1 week EXAM: LEFT LOWER EXTREMITY VENOUS DOPPLER ULTRASOUND TECHNIQUE: Gray-scale sonography with graded compression, as well  as color Doppler and duplex ultrasound were performed to evaluate the lower extremity deep venous systems from the level of the common femoral vein and including the common femoral, femoral, profunda femoral, popliteal and calf veins including the posterior tibial, peroneal and gastrocnemius veins when visible. The superficial great saphenous vein was also interrogated. Spectral Doppler was utilized to evaluate flow at rest and with distal augmentation maneuvers in the common femoral, femoral and popliteal veins. COMPARISON:  None. FINDINGS: Contralateral Common Femoral Vein: Respiratory phasicity is normal and symmetric with the symptomatic side. No evidence of thrombus. Normal compressibility. Common Femoral Vein: No evidence of thrombus. Normal compressibility, respiratory phasicity and response to augmentation. Saphenofemoral Junction: No evidence of thrombus. Normal compressibility and flow on color Doppler imaging. Profunda Femoral Vein: No evidence of thrombus. Normal compressibility and flow on color Doppler imaging. Femoral Vein: No evidence of thrombus. Normal compressibility, respiratory phasicity and response to augmentation. Popliteal Vein: No evidence of thrombus. Normal compressibility, respiratory phasicity and response to augmentation. Calf Veins: No evidence of thrombus. Normal compressibility and flow on color Doppler imaging. Superficial Great Saphenous Vein: No evidence of thrombus.  Normal compressibility. Venous Reflux:  None. Other Findings:  None. IMPRESSION: No evidence of deep venous thrombosis. Electronically Signed   By: Jacqulynn Cadet M.D.   On: 10/30/2019 14:45   US Paracentesis  Result Date: 10/24/2019 INDICATION: Cirrhosis, ascites EXAM: ULTRASOUND GUIDED DIAGNOSTIC AND THERAPEUTIC PARACENTESIS MEDICATIONS: NONE COMPLICATIONS: NONE IMMEDIATE PROCEDURE: Informed written consent was obtained from the patient after a discussion of the risks, benefits and alternatives to treatment. A timeout was performed prior to the initiation of the procedure. Initial ultrasound scanning demonstrates a moderate amount of ascites within the LEFT lower abdominal quadrant. The right lower abdomen was prepped and draped in the usual sterile fashion. 1% lidocaine was used for local anesthesia. Following this, a 5 [place] Yueh catheter was introduced. An ultrasound image was saved for documentation purposes. The paracentesis was performed. The catheter was removed and a dressing was applied. The patient tolerated the procedure well without immediate post procedural complication. Patient received post-procedure intravenous albumin; see nursing notes for details. FINDINGS: A total of approximately 2.9 L of amber colored ascitic fluid was removed. Samples were sent to the laboratory as requested by the clinical team. IMPRESSION: Successful ultrasound-guided paracentesis yielding 2.9 liters of peritoneal fluid. Electronically Signed   By: Lavonia Dana M.D.   On: 10/24/2019 16:07   Dg Chest Port 1 View  Result Date: 11/02/2019 CLINICAL DATA:  Respiratory failure EXAM: PORTABLE CHEST 1 VIEW COMPARISON:  Radiograph 11/01/2019 FINDINGS: Cardiac pacer pack overlies the left chest wall. Pacer lead positioning is unchanged from prior. Surgical clips in the right upper lobe and a spiculated mass lesion in the right upper lung are similar prior accounting for differences in positioning. There is slightly  increasing bilateral airspace disease most pronounced to the infrahilar lungs. Cardiomediastinal contours including a calcified aorta are stable from prior. No pre or paravertebral fluid or swelling. No visible canal hematoma. IMPRESSION: 1. Slightly increasing bilateral airspace disease most pronounced in the infrahilar lungs. Possibly worsening edema or infection. 2. Stable postoperative changes and spiculated mass in the right upper lobe. 3. Prior CABG. 4.  Aortic Atherosclerosis (ICD10-I70.0). Electronically Signed   By: Lovena Le M.D.   On: 11/02/2019 07:02   Dg Chest Port 1 View  Result Date: 11/01/2019 CLINICAL DATA:  Hypoxia. EXAM: PORTABLE CHEST 1 VIEW COMPARISON:  10/30/2019 FINDINGS: Dual lead pacemaker unchanged. Heart size upper  normal. Vascular congestion has progressed. Progressive left lower lobe airspace disease and small left effusion. No effusion on the right. Surgical clips in the right upper lobe with spiculated mass lesion unchanged from prior study. IMPRESSION: Progression of vascular congestion. Progressive left lower lobe airspace disease and left effusion. Findings suggestive of fluid overload however pneumonia in the left lower lobe is possible. Electronically Signed   By: Franchot Gallo M.D.   On: 11/01/2019 06:50     CBC Recent Labs  Lab 10/30/19 1302 11/01/19 0640 11/02/19 0626 11/03/19 0412  WBC 9.2 11.7* 4.2 10.9*  HGB 10.5* 11.0* 10.2* 10.7*  HCT 35.2* 38.2* 34.3* 34.9*  PLT 132* 141* 83* 89*  MCV 98.9 101.1* 99.4 96.7  MCH 29.5 29.1 29.6 29.6  MCHC 29.8* 28.8* 29.7* 30.7  RDW 16.8* 16.6* 16.4* 16.0*  LYMPHSABS 0.2* 0.2*  --   --   MONOABS 0.2 0.4  --   --   EOSABS 0.0 0.0  --   --   BASOSABS 0.0 0.0  --   --     Chemistries  Recent Labs  Lab 10/30/19 1302 10/31/19 0452 11/01/19 0449 11/01/19 0640 11/02/19 0626 11/03/19 0412  NA 132* 134* 133*  --  134* 138  K 3.1* 3.6 3.6  --  3.9 3.5  CL 87* 87* 88*  --  94* 92*  CO2 32 31 31  --  30 33*    GLUCOSE 90 116* 164*  --  160* 103*  BUN 62* 64* 71*  --  74* 76*  CREATININE 1.95* 2.05* 2.41*  --  2.58* 2.57*  CALCIUM 7.0* 7.3* 7.2*  --  6.8* 7.2*  MG  --   --   --  1.4*  --   --   AST 34  --  28  --  23  --   ALT 18  --  15  --  14  --   ALKPHOS 86  --  76  --  65  --   BILITOT 2.0*  --  1.5*  --  1.4*  --    ------------------------------------------------------------------------------------------------------------------ No results for input(s): CHOL, HDL, LDLCALC, TRIG, CHOLHDL, LDLDIRECT in the last 72 hours.  No results found for: HGBA1C ------------------------------------------------------------------------------------------------------------------ No results for input(s): TSH, T4TOTAL, T3FREE, THYROIDAB in the last 72 hours.  Invalid input(s): FREET3 ------------------------------------------------------------------------------------------------------------------ No results for input(s): VITAMINB12, FOLATE, FERRITIN, TIBC, IRON, RETICCTPCT in the last 72 hours.  Coagulation profile Recent Labs  Lab 10/30/19 1302  INR 1.4*    No results for input(s): DDIMER in the last 72 hours.  Cardiac Enzymes No results for input(s): CKMB, TROPONINI, MYOGLOBIN in the last 168 hours.  Invalid input(s): CK ------------------------------------------------------------------------------------------------------------------    Component Value Date/Time   BNP 601.0 (H) 08/10/2019 6754     Kathie Dike M.D on 11/03/2019 at 5:17 PM  Go to [email or web address] - for contact info  [facility] - Office  [phone]

## 2019-11-04 DIAGNOSIS — N179 Acute kidney failure, unspecified: Secondary | ICD-10-CM

## 2019-11-04 DIAGNOSIS — L8990 Pressure ulcer of unspecified site, unspecified stage: Secondary | ICD-10-CM | POA: Insufficient documentation

## 2019-11-04 DIAGNOSIS — A419 Sepsis, unspecified organism: Secondary | ICD-10-CM

## 2019-11-04 DIAGNOSIS — R6520 Severe sepsis without septic shock: Secondary | ICD-10-CM

## 2019-11-04 LAB — CBC
HCT: 36.6 % — ABNORMAL LOW (ref 39.0–52.0)
Hemoglobin: 11.2 g/dL — ABNORMAL LOW (ref 13.0–17.0)
MCH: 29.2 pg (ref 26.0–34.0)
MCHC: 30.6 g/dL (ref 30.0–36.0)
MCV: 95.6 fL (ref 80.0–100.0)
Platelets: 103 10*3/uL — ABNORMAL LOW (ref 150–400)
RBC: 3.83 MIL/uL — ABNORMAL LOW (ref 4.22–5.81)
RDW: 16.4 % — ABNORMAL HIGH (ref 11.5–15.5)
WBC: 20.7 10*3/uL — ABNORMAL HIGH (ref 4.0–10.5)
nRBC: 0.1 % (ref 0.0–0.2)

## 2019-11-04 LAB — COMPREHENSIVE METABOLIC PANEL
ALT: 13 U/L (ref 0–44)
AST: 27 U/L (ref 15–41)
Albumin: 2.6 g/dL — ABNORMAL LOW (ref 3.5–5.0)
Alkaline Phosphatase: 61 U/L (ref 38–126)
Anion gap: 11 (ref 5–15)
BUN: 66 mg/dL — ABNORMAL HIGH (ref 8–23)
CO2: 34 mmol/L — ABNORMAL HIGH (ref 22–32)
Calcium: 7.2 mg/dL — ABNORMAL LOW (ref 8.9–10.3)
Chloride: 93 mmol/L — ABNORMAL LOW (ref 98–111)
Creatinine, Ser: 1.91 mg/dL — ABNORMAL HIGH (ref 0.61–1.24)
GFR calc Af Amer: 41 mL/min — ABNORMAL LOW (ref >60)
GFR calc non Af Amer: 35 mL/min — ABNORMAL LOW (ref >60)
Glucose, Bld: 96 mg/dL (ref 70–99)
Potassium: 3.2 mmol/L — ABNORMAL LOW (ref 3.5–5.1)
Sodium: 138 mmol/L (ref 135–145)
Total Bilirubin: 1.9 mg/dL — ABNORMAL HIGH (ref 0.3–1.2)
Total Protein: 5.8 g/dL — ABNORMAL LOW (ref 6.5–8.1)

## 2019-11-04 LAB — BLOOD GAS, ARTERIAL
Acid-Base Excess: 10.8 mmol/L — ABNORMAL HIGH (ref 0.0–2.0)
Bicarbonate: 33.2 mmol/L — ABNORMAL HIGH (ref 20.0–28.0)
FIO2: 28
O2 Saturation: 94.7 %
Patient temperature: 37
pCO2 arterial: 62.9 mmHg — ABNORMAL HIGH (ref 32.0–48.0)
pH, Arterial: 7.379 (ref 7.350–7.450)
pO2, Arterial: 78.6 mmHg — ABNORMAL LOW (ref 83.0–108.0)

## 2019-11-04 LAB — GLUCOSE, CAPILLARY
Glucose-Capillary: 100 mg/dL — ABNORMAL HIGH (ref 70–99)
Glucose-Capillary: 104 mg/dL — ABNORMAL HIGH (ref 70–99)
Glucose-Capillary: 105 mg/dL — ABNORMAL HIGH (ref 70–99)
Glucose-Capillary: 116 mg/dL — ABNORMAL HIGH (ref 70–99)
Glucose-Capillary: 125 mg/dL — ABNORMAL HIGH (ref 70–99)
Glucose-Capillary: 98 mg/dL (ref 70–99)

## 2019-11-04 LAB — AMMONIA: Ammonia: 66 umol/L — ABNORMAL HIGH (ref 9–35)

## 2019-11-04 MED ORDER — LORAZEPAM 2 MG/ML IJ SOLN
1.0000 mg | INTRAMUSCULAR | Status: DC | PRN
  Administered 2019-11-04 – 2019-11-07 (×5): 1 mg via INTRAVENOUS
  Filled 2019-11-04 (×5): qty 1

## 2019-11-04 MED ORDER — CHLORDIAZEPOXIDE HCL 5 MG PO CAPS
15.0000 mg | ORAL_CAPSULE | Freq: Three times a day (TID) | ORAL | Status: DC
  Administered 2019-11-04 – 2019-11-07 (×9): 15 mg via ORAL
  Filled 2019-11-04 (×9): qty 3

## 2019-11-04 MED ORDER — ONDANSETRON HCL 4 MG/2ML IJ SOLN
4.0000 mg | Freq: Four times a day (QID) | INTRAMUSCULAR | Status: DC | PRN
  Administered 2019-11-04 – 2019-11-06 (×5): 4 mg via INTRAVENOUS
  Filled 2019-11-04 (×5): qty 2

## 2019-11-04 MED ORDER — ALUM & MAG HYDROXIDE-SIMETH 200-200-20 MG/5ML PO SUSP
30.0000 mL | ORAL | Status: DC | PRN
  Administered 2019-11-04 – 2019-11-05 (×2): 30 mL via ORAL
  Filled 2019-11-04 (×2): qty 30

## 2019-11-04 NOTE — Progress Notes (Signed)
Patient Demographics:    Shaun May, is a 68 y.o. male, DOB - May 06, 1951, WGY:[id number]  Admit date - 10/30/2019   Admitting Physician Neena Rhymes, MD  Outpatient Primary MD for the patient is Celene Squibb, MD  LOS - 5   Chief Complaint  Patient presents with   Recurrent Skin Infections        Subjective:    Shaun May still SOB and deconditioned. No nausea, no vomiting. Patient is afebrile. Oriented X 2. No agitation.    Assessment  & Plan :    Principal Problem:   Sepsis ([place]) Active Problems:   Hypertension   COPD GOLD IV/ 02 dep at hs/ still smoking    Atrial flutter (HCC), paroxysmal   Cirrhosis of liver with ascites (HCC)   Cellulitis of left leg/Wound Infection   ETOH abuse   Acute on chronic respiratory failure with hypoxia (HCC)   (HFpEF) heart failure with preserved ejection fraction/EF 77-93%/ Chronic Diastolic Dysfunction CHF   Thrombocytopenia (HCC)   Pressure injury of skin  Brief Summary:- 69 y.o. 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  Chronic venous insufficiency, history of left leg cellulitis, history of cirrhosis with ascites status post paracentesis in the last month.  Patient lives alone with an intermittent visitation by a girlfriend.  According to the patient's son he is irregular in taking his medications as instructed -Lethargy and respiratory distress on 11/01/2019 transferred to ICU on BiPAP  A/p  1) sepsis secondary to Lt Leg Cellulitis/Wound infection --- open wounds on left lower extremity- with secondary infection-pneumonia may be contributing to patient's septic picture as well -Sepsis pathophysiology exhibited by hypothermia, altered mentation, hypotension and worsening renal perfusion,  leukocytosis appears to be resolving -MRSA PCR Neg -10/30/2019 NGTD -WBC is down to 10.9 from 11.7, but patient is also on steroids -Given hypothermia blood cultures repeated on 11/02/2019, but have shown no growth -IV antibiotics switched from Unasyn to meropenem -continue to follow response.   2)Presumed pneumonia- may be aspiration related antibiotics as above #1,  -He has been weaned off of BiPAP and is back on nasal cannula -Continue intravenous antibiotics for now  3) alcohol withdrawal with delirium tremens.   -Patient placed on Precedex drip with improvement of agitation. -Weaned off precedex on 11/04/19 -started on librium now.  -cessation counseling provided. -continue thiamine and folic acid.   4) generalized weakness and deconditioning--recurrent falls at home -Physical therapy evaluated patient previously recommended SNF rehab -follow improvement and placement.   5) COPD and tobacco abuse--continues to smoke despite being on home O2 -continue bronchodilators and steroids and antibiotics.  6)Acute on chronic respiratory failure with hypoxia and hypercapnia --- secondary to #5 above, PTA patient used 2 to 3 L of oxygen at home continue oxygen supplementation -Acute on chronic respiratory failure secondary to presumed aspiration event on 10/31/2019 required BiPAP; Chest x-ray on 11/01/2019 and 11/02/2019 Noted -CHF versus pneumonia  -Clinically suspect aspiration pneumonia Antibiotics as above in #1 -As of 11/28, has been weaned off of BiPAP and so far tolerating it well -ABG with chronic CO2 build up. -continue 2-3L [place].  7)alcoholic liver cirrhosis--requiring recurrent paracenteses -Unable to give torsemide due to respiratory distress requiring continuous BiPAP as well as soft BP -Ammonia noted at 66 -Continue lactulose  8)HFpEF--- last known EF 60 to 65%,  -patient with stable chronic diastolic CHF/nonischemic cardiomyopathy, appears compensated at this time.     -Follow daily weights and strict I's and O's.  8)Social/ethics--patient technically lives alone , but his girlfriend has been technically living with him lately due to weakness and recurrent falls -Patient and girlfriend drink alcohol together--- patient is a full code  -Patient is a full code as per family  9) history of complete heart block--- status post permanent pacemaker placement since 2016, patient follows with Dr. Lovena Le  10)Persistent atrial fibrillation/flutter--- unable to give Coreg due to soft BP, not a candidate for anticoagulation due to recurrent GI bleed and hematuria in an alcoholic male with recurrent falls  11) acute metabolic encephalopathy--multifactorial suspect some component of DTs, also some component of hepatic encephalopathy with elevated ammonia, and CO2 narcosis/hypercapnia in the setting of acute on chronic hypoxic and hypercapnic respiratory failure -Treat underlying etiologies and metabolic encephalopathy should improve -Overall mental status is improving  12)AKI----acute kidney injury on CKD stage IIIb-      -creatinine on admission=1.95  ,   baseline creatinine =1.1 in 08/2019, creatinine is now=2.57, renally adjust medications, avoid nephrotoxic agents. -Continue to follow adequate hydration and avoid hypotension. -Good urine output reported.  13) thrombocytopenia.  Possibly related to alcohol use.   -Continue to follow.   -No signs of bleeding at this time.  Disposition/Need for in-Hospital Stay-patient needs continued inpatient treatment for alcohol withdrawal since he still requiring Precedex infusion.  As he improves, will need most likely a skilled nursing facility for rehabilitation.  He is still very weak and deconditioned.  Still requiring oxygen supplementation and having slow improvement in respiratory status.  Code Status : Full code  Family Communication:    Male Friend at bedside -Discussed with patient's son who lives in [place]  Disposition Plan  : To be determined  Consults  :  na  DVT Prophylaxis  :  Lovenox -  - SCDs   Lab Results  Component Value Date   PLT 103 (L) 11/04/2019   Inpatient Medications  Scheduled Meds:  chlordiazePOXIDE  15 mg Oral TID   chlorhexidine  15 mL Mouth Rinse BID   Chlorhexidine Gluconate Cloth  6 each Topical Daily   enoxaparin (LOVENOX) injection  40 mg Subcutaneous Q24H   fluticasone furoate-vilanterol  1 puff Inhalation Daily   And   umeclidinium bromide  1 puff Inhalation Daily   guaiFENesin  15 mL Oral TID   ipratropium-albuterol  3 mL Nebulization Q6H   lactulose  30 g Oral BID   mouth rinse  15 mL Mouth Rinse q12n4p   methylPREDNISolone (SOLU-MEDROL) injection  125 mg Intravenous Once   methylPREDNISolone (SOLU-MEDROL) injection  40 mg Intravenous Q8H   pantoprazole (PROTONIX) IV  40 mg Intravenous Q24H   sodium chloride flush  3 mL Intravenous Q12H   Continuous Infusions:  sodium chloride     meropenem (MERREM) IV 1 g (11/04/19 1102)   PRN Meds:.sodium chloride, acetaminophen **OR** acetaminophen, albuterol, morphine injection, sodium chloride flush  Anti-infectives (From admission, onward)   Start     Dose/Rate Route Frequency Ordered Stop   11/02/19 1200  meropenem (MERREM) 1 g in sodium chloride 0.9 % 100 mL IVPB     1 g 200 mL/hr over 30 Minutes  Intravenous Every 12 hours 11/02/19 1044     11/01/19 0900  Ampicillin-Sulbactam (UNASYN) 3 g in sodium chloride 0.9 % 100 mL IVPB  Status:  Discontinued     3 g 200 mL/hr over 30 Minutes Intravenous Every 8 hours 11/01/19 0843 11/02/19 1023   11/01/19 0630  Ampicillin-Sulbactam (UNASYN) 3 g in sodium chloride 0.9 % 100 mL IVPB  Status:  Discontinued     3 g 200 mL/hr over 30 Minutes Intravenous Every 6 hours 11/01/19 0629 11/01/19 0843   10/31/19 0900  cefTRIAXone (ROCEPHIN) 1 g in sodium chloride 0.9 % 100 mL IVPB  Status:  Discontinued     1 g 200 mL/hr over 30 Minutes Intravenous  Every 24 hours 10/31/19 0848 11/01/19 0807        Objective:   Vitals:   11/04/19 1132 11/04/19 1200 11/04/19 1300 11/04/19 1400  BP:  110/68 102/72 108/79  Pulse:  60 60   Resp:  (!) 23 20 19   Temp: 98.1 F (36.7 C)     TempSrc: Oral     SpO2:  93% 92%   Weight:      Height:        Wt Readings from Last 3 Encounters:  11/04/19 88.1 kg  10/05/19 91.6 kg  09/06/19 91.6 kg    Intake/Output Summary (Last 24 hours) at 11/04/2019 1444 Last data filed at 11/04/2019 0716 Gross per 24 hour  Intake 2022.02 ml  Output 450 ml  Net 1572.02 ml   Physical Exam General exam: Alert, awake, oriented x 2; no chest pain, no nausea, no vomiting.  Still requiring 2 L nasal cannula supplementation, with diffuse rhonchi and short of breath with minimal exertion. Respiratory system: No using accessory muscles, diffuse rhonchi, decreased breath sounds at the bases.  Normal respiratory effort.  Good O2 sat on 2 L nasal cannula supplementation. Cardiovascular system:RRR. No murmurs, rubs, gallops. Gastrointestinal system: Abdomen is nondistended, soft and nontender. No organomegaly or masses felt. Normal bowel sounds heard. Central nervous system: No focal neurological deficits. Extremities: No cyanosis or clubbing; trace edema bilaterally appreciated. Skin: Left lower extremity with erythematous appearance, some declamation and chronic stasis dermatitis process. Psychiatry: No agitation, following commands appropriately; oriented x2.    Data Review:   Micro Results Recent Results (from the past 240 hour(s))  Culture, blood (single)     Status: None (Preliminary result)   Collection Time: 10/30/19  7:08 PM   Specimen: Right Antecubital; Blood  Result Value Ref Range Status   Specimen Description RIGHT ANTECUBITAL  Final   Special Requests   Final    BOTTLES DRAWN AEROBIC AND ANAEROBIC Blood Culture adequate volume   Culture   Final    NO GROWTH 4 DAYS Performed at [facility],  [address], [place], [place] [address]    Report Status PENDING  Incomplete  SARS CORONAVIRUS 2 (TAT 6-24 HRS) Nasopharyngeal Nasopharyngeal Swab     Status: None   Collection Time: 10/31/19  7:01 AM   Specimen: Nasopharyngeal Swab  Result Value Ref Range Status   SARS Coronavirus 2 NEGATIVE NEGATIVE Final    Comment: (NOTE) SARS-CoV-2 target nucleic acids are NOT DETECTED. The SARS-CoV-2 RNA is generally detectable in upper and lower respiratory specimens during the acute phase of infection. Negative results do not preclude SARS-CoV-2 infection, do not rule out co-infections with other pathogens, and should not be used as the sole basis for treatment or other patient management decisions. Negative results must be combined with clinical  observations, patient history, and epidemiological information. The expected result is Negative. Fact Sheet for Patients: SugarRoll.be Fact Sheet for Healthcare Providers: https://www.woods-mathews.com/ This test is not yet approved or cleared by the [place] FDA and  has been authorized for detection and/or diagnosis of SARS-CoV-2 by FDA under an Emergency Use Authorization (EUA). This EUA will remain  in effect (meaning this test can be used) for the duration of the COVID-19 declaration under Section 56 4(b)(1) of the Act, 21 U.S.C. section 360bbb-3(b)(1), unless the authorization is terminated or revoked sooner. Performed at [facility], [place] [address] [place], [place] [address]   MRSA PCR Screening     Status: None   Collection Time: 10/31/19  5:59 PM   Specimen: Nasal Mucosa; Nasopharyngeal  Result Value Ref Range Status   MRSA by PCR NEGATIVE NEGATIVE Final    Comment:        The GeneXpert MRSA Assay (FDA approved for NASAL specimens only), is one component of a comprehensive MRSA colonization surveillance program. It is not intended to diagnose MRSA infection nor to guide or monitor  treatment for MRSA infections. Performed at [facility], [address] [place], [place] [address]   Culture, blood (Routine X 2) w Reflex to ID Panel     Status: None (Preliminary result)   Collection Time: 11/02/19  9:16 AM   Specimen: BLOOD LEFT HAND  Result Value Ref Range Status   Specimen Description   Final    BLOOD LEFT HAND BOTTLES DRAWN AEROBIC AND ANAEROBIC   Special Requests Blood Culture adequate volume  Final   Culture   Final    NO GROWTH < 24 HOURS Performed at [facility], [address], [place], [place] [address]    Report Status PENDING  Incomplete  Culture, blood (Routine X 2) w Reflex to ID Panel     Status: None (Preliminary result)   Collection Time: 11/02/19 11:09 AM   Specimen: BLOOD  Result Value Ref Range Status   Specimen Description BLOOD BLOOD LEFT WRIST  Final   Special Requests   Final    BOTTLES DRAWN AEROBIC AND ANAEROBIC Blood Culture adequate volume   Culture   Final    NO GROWTH < 24 HOURS Performed at [facility], [address] [place], [place] [address]    Report Status PENDING  Incomplete    Radiology Reports Dg Chest 2 View  Result Date: 10/30/2019 CLINICAL DATA:  Lower extremity redness, swelling and increased fluid retention, more confusion evening, multiple falls this past weekend, laid on floor for hours, history smoking, pacemaker, atrial fibrillation, COPD, chronic kidney disease, hypertension, CHF, RIGHT upper lobe lung cancer EXAM: CHEST - 2 VIEW COMPARISON:  08/10/2019 Correlation: CT angio chest 05/17/2018 FINDINGS: LEFT subclavian sequential transvenous pacemaker leads project at RIGHT atrium and RIGHT ventricle. Normal heart size, mediastinal contours, and pulmonary vascularity. Atherosclerotic calcification aorta. Scarring in RIGHT upper lobe with associated stellate opacity, question post radiation therapy change; this has increased since the previous study. Atelectasis versus consolidation LEFT lower lobe. Remaining lungs  clear. Small LEFT and tiny RIGHT pleural effusions. No pneumothorax or acute osseous findings. IMPRESSION: Atelectasis versus consolidation LEFT lower lobe with associated LEFT pleural effusion. Increased delayed opacity in the RIGHT upper lobe question progressive post radiation therapy changes. Tiny RIGHT pleural effusion. Aortic Atherosclerosis (ICD10-I70.0). Electronically Signed   By: Lavonia Dana M.D.   On: 10/30/2019 14:58   US Venous Img Lower Unilateral Left (dvt)  Result Date: 10/30/2019 CLINICAL  DATA:  68 year old male with left lower extremity redness and swelling for 1 week EXAM: LEFT LOWER EXTREMITY VENOUS DOPPLER ULTRASOUND TECHNIQUE: Gray-scale sonography with graded compression, as well as color Doppler and duplex ultrasound were performed to evaluate the lower extremity deep venous systems from the level of the common femoral vein and including the common femoral, femoral, profunda femoral, popliteal and calf veins including the posterior tibial, peroneal and gastrocnemius veins when visible. The superficial great saphenous vein was also interrogated. Spectral Doppler was utilized to evaluate flow at rest and with distal augmentation maneuvers in the common femoral, femoral and popliteal veins. COMPARISON:  None. FINDINGS: Contralateral Common Femoral Vein: Respiratory phasicity is normal and symmetric with the symptomatic side. No evidence of thrombus. Normal compressibility. Common Femoral Vein: No evidence of thrombus. Normal compressibility, respiratory phasicity and response to augmentation. Saphenofemoral Junction: No evidence of thrombus. Normal compressibility and flow on color Doppler imaging. Profunda Femoral Vein: No evidence of thrombus. Normal compressibility and flow on color Doppler imaging. Femoral Vein: No evidence of thrombus. Normal compressibility, respiratory phasicity and response to augmentation. Popliteal Vein: No evidence of thrombus. Normal compressibility, respiratory  phasicity and response to augmentation. Calf Veins: No evidence of thrombus. Normal compressibility and flow on color Doppler imaging. Superficial Great Saphenous Vein: No evidence of thrombus. Normal compressibility. Venous Reflux:  None. Other Findings:  None. IMPRESSION: No evidence of deep venous thrombosis. Electronically Signed   By: Jacqulynn Cadet M.D.   On: 10/30/2019 14:45   US Paracentesis  Result Date: 10/24/2019 INDICATION: Cirrhosis, ascites EXAM: ULTRASOUND GUIDED DIAGNOSTIC AND THERAPEUTIC PARACENTESIS MEDICATIONS: NONE COMPLICATIONS: NONE IMMEDIATE PROCEDURE: Informed written consent was obtained from the patient after a discussion of the risks, benefits and alternatives to treatment. A timeout was performed prior to the initiation of the procedure. Initial ultrasound scanning demonstrates a moderate amount of ascites within the LEFT lower abdominal quadrant. The right lower abdomen was prepped and draped in the usual sterile fashion. 1% lidocaine was used for local anesthesia. Following this, a 5 [place] Yueh catheter was introduced. An ultrasound image was saved for documentation purposes. The paracentesis was performed. The catheter was removed and a dressing was applied. The patient tolerated the procedure well without immediate post procedural complication. Patient received post-procedure intravenous albumin; see nursing notes for details. FINDINGS: A total of approximately 2.9 L of amber colored ascitic fluid was removed. Samples were sent to the laboratory as requested by the clinical team. IMPRESSION: Successful ultrasound-guided paracentesis yielding 2.9 liters of peritoneal fluid. Electronically Signed   By: Lavonia Dana M.D.   On: 10/24/2019 16:07   Dg Chest Port 1 View  Result Date: 11/02/2019 CLINICAL DATA:  Respiratory failure EXAM: PORTABLE CHEST 1 VIEW COMPARISON:  Radiograph 11/01/2019 FINDINGS: Cardiac pacer pack overlies the left chest wall. Pacer lead positioning is  unchanged from prior. Surgical clips in the right upper lobe and a spiculated mass lesion in the right upper lung are similar prior accounting for differences in positioning. There is slightly increasing bilateral airspace disease most pronounced to the infrahilar lungs. Cardiomediastinal contours including a calcified aorta are stable from prior. No pre or paravertebral fluid or swelling. No visible canal hematoma. IMPRESSION: 1. Slightly increasing bilateral airspace disease most pronounced in the infrahilar lungs. Possibly worsening edema or infection. 2. Stable postoperative changes and spiculated mass in the right upper lobe. 3. Prior CABG. 4.  Aortic Atherosclerosis (ICD10-I70.0). Electronically Signed   By: Lovena Le M.D.   On: 11/02/2019 07:02  Dg Chest Port 1 View  Result Date: 11/01/2019 CLINICAL DATA:  Hypoxia. EXAM: PORTABLE CHEST 1 VIEW COMPARISON:  10/30/2019 FINDINGS: Dual lead pacemaker unchanged. Heart size upper normal. Vascular congestion has progressed. Progressive left lower lobe airspace disease and small left effusion. No effusion on the right. Surgical clips in the right upper lobe with spiculated mass lesion unchanged from prior study. IMPRESSION: Progression of vascular congestion. Progressive left lower lobe airspace disease and left effusion. Findings suggestive of fluid overload however pneumonia in the left lower lobe is possible. Electronically Signed   By: Franchot Gallo M.D.   On: 11/01/2019 06:50     CBC Recent Labs  Lab 10/30/19 1302 11/01/19 0640 11/02/19 0626 11/03/19 0412 11/04/19 0448  WBC 9.2 11.7* 4.2 10.9* 20.7*  HGB 10.5* 11.0* 10.2* 10.7* 11.2*  HCT 35.2* 38.2* 34.3* 34.9* 36.6*  PLT 132* 141* 83* 89* 103*  MCV 98.9 101.1* 99.4 96.7 95.6  MCH 29.5 29.1 29.6 29.6 29.2  MCHC 29.8* 28.8* 29.7* 30.7 30.6  RDW 16.8* 16.6* 16.4* 16.0* 16.4*  LYMPHSABS 0.2* 0.2*  --   --   --   MONOABS 0.2 0.4  --   --   --   EOSABS 0.0 0.0  --   --   --   BASOSABS  0.0 0.0  --   --   --     Chemistries  Recent Labs  Lab 10/30/19 1302 10/31/19 0452 11/01/19 0449 11/01/19 0640 11/02/19 0626 11/03/19 0412 11/04/19 0448  NA 132* 134* 133*  --  134* 138 138  K 3.1* 3.6 3.6  --  3.9 3.5 3.2*  CL 87* 87* 88*  --  94* 92* 93*  CO2 32 31 31  --  30 33* 34*  GLUCOSE 90 116* 164*  --  160* 103* 96  BUN 62* 64* 71*  --  74* 76* 66*  CREATININE 1.95* 2.05* 2.41*  --  2.58* 2.57* 1.91*  CALCIUM 7.0* 7.3* 7.2*  --  6.8* 7.2* 7.2*  MG  --   --   --  1.4*  --   --   --   AST 34  --  28  --  23  --  27  ALT 18  --  15  --  14  --  13  ALKPHOS 86  --  76  --  65  --  61  BILITOT 2.0*  --  1.5*  --  1.4*  --  1.9*   Coagulation profile Recent Labs  Lab 10/30/19 1302  INR 1.4*    Cardiac Enzymes No results for input(s): CKMB, TROPONINI, MYOGLOBIN in the last 168 hours.  Invalid input(s): CK ------------------------------------------------------------------------------------------------------------------    Component Value Date/Time   BNP 601.0 (H) 08/10/2019 2336     Barton Dubois M.D on 11/04/2019 at 2:44 PM  Go to [email or web address] - for contact info  [facility] - Office  [phone]

## 2019-11-04 NOTE — Progress Notes (Signed)
New foams placed on LLE, sacrum, posterior bilateral thighs, and buttocks. IV dressing in [place] changed.

## 2019-11-04 NOTE — Progress Notes (Signed)
Patient appears to be doing well on oxygen cannula. 2 liters saturation 98. Will keep off BiPAP and check blood gas in am. Suspect he will have a chronic high PCO2 60's ?? But most likely is his normal.

## 2019-11-05 ENCOUNTER — Encounter (HOSPITAL_COMMUNITY): Payer: Self-pay

## 2019-11-05 ENCOUNTER — Inpatient Hospital Stay (HOSPITAL_COMMUNITY): Payer: Medicare Other

## 2019-11-05 LAB — COMPREHENSIVE METABOLIC PANEL
ALT: 16 U/L (ref 0–44)
AST: 35 U/L (ref 15–41)
Albumin: 2.8 g/dL — ABNORMAL LOW (ref 3.5–5.0)
Alkaline Phosphatase: 77 U/L (ref 38–126)
Anion gap: 11 (ref 5–15)
BUN: 64 mg/dL — ABNORMAL HIGH (ref 8–23)
CO2: 36 mmol/L — ABNORMAL HIGH (ref 22–32)
Calcium: 7.6 mg/dL — ABNORMAL LOW (ref 8.9–10.3)
Chloride: 94 mmol/L — ABNORMAL LOW (ref 98–111)
Creatinine, Ser: 1.65 mg/dL — ABNORMAL HIGH (ref 0.61–1.24)
GFR calc Af Amer: 49 mL/min — ABNORMAL LOW (ref >60)
GFR calc non Af Amer: 42 mL/min — ABNORMAL LOW (ref >60)
Glucose, Bld: 114 mg/dL — ABNORMAL HIGH (ref 70–99)
Potassium: 3.1 mmol/L — ABNORMAL LOW (ref 3.5–5.1)
Sodium: 141 mmol/L (ref 135–145)
Total Bilirubin: 2.4 mg/dL — ABNORMAL HIGH (ref 0.3–1.2)
Total Protein: 6.5 g/dL (ref 6.5–8.1)

## 2019-11-05 LAB — GLUCOSE, CAPILLARY
Glucose-Capillary: 104 mg/dL — ABNORMAL HIGH (ref 70–99)
Glucose-Capillary: 103 mg/dL — ABNORMAL HIGH (ref 70–99)
Glucose-Capillary: 111 mg/dL — ABNORMAL HIGH (ref 70–99)
Glucose-Capillary: 98 mg/dL (ref 70–99)

## 2019-11-05 LAB — CULTURE, BLOOD (SINGLE)
Culture: NO GROWTH
Special Requests: ADEQUATE

## 2019-11-05 LAB — AMMONIA: Ammonia: 16 umol/L (ref 9–35)

## 2019-11-05 MED ORDER — LACTULOSE 10 GM/15ML PO SOLN
20.0000 g | Freq: Two times a day (BID) | ORAL | Status: DC
  Administered 2019-11-05 – 2019-11-07 (×4): 20 g via ORAL
  Filled 2019-11-05 (×4): qty 30

## 2019-11-05 MED ORDER — ALBUMIN HUMAN 25 % IV SOLN
INTRAVENOUS | Status: AC
  Filled 2019-11-05: qty 200

## 2019-11-05 MED ORDER — POTASSIUM CHLORIDE 10 MEQ/100ML IV SOLN
10.0000 meq | INTRAVENOUS | Status: AC
  Administered 2019-11-05 (×2): 10 meq via INTRAVENOUS
  Filled 2019-11-05 (×2): qty 100

## 2019-11-05 MED ORDER — PANTOPRAZOLE SODIUM 40 MG PO TBEC
40.0000 mg | DELAYED_RELEASE_TABLET | Freq: Two times a day (BID) | ORAL | Status: DC
  Administered 2019-11-05 – 2019-11-07 (×4): 40 mg via ORAL
  Filled 2019-11-05 (×4): qty 1

## 2019-11-05 MED ORDER — IPRATROPIUM-ALBUTEROL 0.5-2.5 (3) MG/3ML IN SOLN
3.0000 mL | Freq: Three times a day (TID) | RESPIRATORY_TRACT | Status: DC
  Administered 2019-11-06 – 2019-11-07 (×5): 3 mL via RESPIRATORY_TRACT
  Filled 2019-11-05 (×4): qty 3

## 2019-11-05 MED ORDER — SODIUM CHLORIDE 0.9 % IV SOLN
1.0000 g | Freq: Two times a day (BID) | INTRAVENOUS | Status: AC
  Administered 2019-11-05 – 2019-11-06 (×4): 1 g via INTRAVENOUS
  Filled 2019-11-05 (×4): qty 1

## 2019-11-05 MED ORDER — ALBUMIN HUMAN 25 % IV SOLN
INTRAVENOUS | Status: AC
  Filled 2019-11-05: qty 50

## 2019-11-05 MED ORDER — ALBUMIN HUMAN 25 % IV SOLN
50.0000 g | Freq: Four times a day (QID) | INTRAVENOUS | Status: AC
  Administered 2019-11-05 (×2): 50 g via INTRAVENOUS
  Filled 2019-11-05: qty 50

## 2019-11-05 NOTE — Procedures (Signed)
PreOperative Dx: Cirrhosis, ascites Postoperative Dx: Cirrhosis, ascites Procedure:   US guided paracentesis Radiologist:  Thornton Papas Anesthesia:  10 ml of1% lidocaine Specimen:  2.3 L of amber colored ascitic fluid EBL:   < 1 ml Complications: None

## 2019-11-05 NOTE — Progress Notes (Signed)
Pharmacy Antibiotic Note  Shaun May is a 68 y.o. male admitted on 10/30/2019 with pneumonia, cellulitis and sepsis.  Pharmacy has been consulted for Meropenem dosing.  Cellulitis improving, still concern with aspiration PNA. WBC improved, afebrile. D/W MD and plan for 48 more hours on merrem.  Plan: Continue Meropenem 1000 mg IV every 12 hours. Monitor labs, c/s, and patient improvement.  Height: 5\' 10"  (177.8 cm) Weight: 190 lb 0.6 oz (86.2 kg) IBW/kg (Calculated) : 73  Temp (24hrs), Avg:98.1 F (36.7 C), Min:97.7 F (36.5 C), Max:98.3 F (36.8 C)  Recent Labs  Lab 10/30/19 1302  11/01/19 0449 11/01/19 0640 11/02/19 0626 11/03/19 0412 11/04/19 0448 11/05/19 0650  WBC 9.2  --   --  11.7* 4.2 10.9* 20.7*  --   CREATININE 1.95*   < > 2.41*  --  2.58* 2.57* 1.91* 1.65*  LATICACIDVEN  --   --   --  1.6  --   --   --   --    < > = values in this interval not displayed.    Estimated Creatinine Clearance: 44.9 mL/min (A) (by C-G formula based on SCr of 1.65 mg/dL (H)).    Allergies  Allergen Reactions  . Xarelto [Rivaroxaban] Other (See Comments)    Caused bleeding.   . Eliquis [Apixaban]     Internal bleeding    Antimicrobials this admission: Merrem 11/27 >>  Unasyn 1126 >> 11/27 CTX 11/25 >> 11/26  Dose adjustments this admission: [place]  Microbiology results: 11/27 BCx: ngtd 11/24 BCx: ngtd 11/25 MRSA PCR: negative  Thank you for allowing pharmacy to be a part of this patient's care.  Isac Sarna, BS Pharm D, [place] Clinical Pharmacist Pager [phone] 11/05/2019 10:41 AM

## 2019-11-05 NOTE — Progress Notes (Signed)
Went back to patient's room after RN called and told me that she was putting patient on [place].  After placing patient on, he began to complain that he was having trouble again with feeling sick.  I told RN that it may be a good idea to leave patient on [place] for the night since he has been having so much trouble with N/V.  RN placed patient back on [place].  Will continue to monitor patient.

## 2019-11-05 NOTE — TOC Progression Note (Signed)
Transition of Care [facility]) - Progression Note    Patient Details  Name: Shaun May MRN: [id number] Date of Birth: [birth year]  Transition of Care [facility]) CM/SW Contact  Shade Flood, LCSW Phone Number: 11/05/2019, 11:21 AM  Clinical Narrative:     TOC following. Pt not currently oriented. Plan remains for SNF at dc. Pt has bed offer from [facility]. Other referrals are still pending. MD anticipating dc in another couple of days. TOC will follow up tomorrow to confirm SNF choice if pt is oriented. If not, will have to discuss with pt's first family contact.   Expected Discharge Plan: [place] Barriers to Discharge: Continued Medical Work up  Expected Discharge Plan and Services Expected Discharge Plan: [place] arrangements for the past 2 months: [place] Determinants of Health (SDOH) Interventions    Readmission Risk Interventions Readmission Risk Prevention Plan 10/31/2019  Transportation Screening Complete  PCP or Specialist Appt within 3-5 Days Not Complete  HRI or [place] Complete  Social Work Consult for [place] Planning/Counseling Complete  Palliative Care Screening Not Complete  Medication Review [employer]) Complete  Some recent data might be hidden

## 2019-11-05 NOTE — Progress Notes (Signed)
Patient continues to have incontinent loose diarrhea from lactulose. Patient disoriented through out night after 2200. Edema present from the abdomen down to his feet and is increasingly worsening, 2-3+. Patient has episodes of dyspnea with exertion such as turns and repositioning. Patient has been agitated and restless for entire shift, PRN ordered and administered as ordered.

## 2019-11-05 NOTE — Progress Notes (Signed)
Patient Demographics:    Shaun May, is a 68 y.o. male, DOB - [birth year], SRP:[id number]  Admit date - 10/30/2019   Admitting Physician Neena Rhymes, MD  Outpatient Primary MD for the patient is Celene Squibb, MD  LOS - 6   Chief Complaint  Patient presents with   Recurrent Skin Infections        Subjective:    Shaun May still having shortness of breath with minimal exertion.  No nausea, no vomiting.  Distended abdomen with fluid wave, requiring oxygen supplementation and easily fatigued and deconditioned.  No agitation.  Oriented x2.   Assessment  & Plan :    Principal Problem:   Sepsis ([place]) Active Problems:   Hypertension   COPD GOLD IV/ 02 dep at hs/ still smoking    Atrial flutter (HCC), paroxysmal   Cirrhosis of liver with ascites (HCC)   Cellulitis of left leg/Wound Infection   ETOH abuse   Acute on chronic respiratory failure with hypoxia (HCC)   (HFpEF) heart failure with preserved ejection fraction/EF 24-46%/ Chronic Diastolic Dysfunction CHF   Thrombocytopenia (HCC)   Pressure injury of skin  Brief Summary:- 68 y.o. 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  Chronic venous insufficiency, history of left leg cellulitis, history of cirrhosis with ascites status post paracentesis in the last month.  Patient lives alone with an intermittent visitation by a girlfriend.  According to the patient's son he is irregular in taking his medications as instructed -Lethargy and respiratory distress on 11/01/2019 transferred to ICU on BiPAP  A/p  1) sepsis secondary to Lt Leg Cellulitis/Wound infection --- open wounds on left lower extremity- with secondary infection-pneumonia may be contributing to patient's septic picture as  well -Sepsis pathophysiology exhibited by hypothermia, altered mentation, hypotension and worsening renal perfusion, leukocytosis appears to be resolving -MRSA PCR Neg -10/30/2019 NGTD -WBC is down to 10.9 from 11.7, but patient is also on steroids -Given hypothermia blood cultures repeated on 11/02/2019, but have shown no growth -IV antibiotics switched from Unasyn to meropenem -continue to follow response.   2)Presumed pneumonia- may be aspiration related antibiotics as above #1,  -He has been weaned off of BiPAP and is back on nasal cannula -Continue intravenous antibiotics for now  3) alcohol withdrawal with delirium tremens.   -Patient placed on Precedex drip with improvement of agitation. -Weaned off precedex on 11/04/19 -started on librium now.  -cessation counseling provided. -continue thiamine and folic acid.   4) generalized weakness and deconditioning--recurrent falls at home -Physical therapy evaluated patient previously recommended SNF rehab -follow improvement and placement.   5) COPD and tobacco abuse--continues to smoke despite being on home O2 -continue bronchodilators and steroids and antibiotics.  6)Acute on chronic respiratory failure with hypoxia and hypercapnia --- secondary to #5 above, PTA patient used 2 to 3 L of oxygen at home continue oxygen supplementation -Acute on chronic respiratory failure secondary to presumed aspiration event on 10/31/2019 required BiPAP; Chest x-ray on 11/01/2019 and 11/02/2019 Noted -CHF versus pneumonia  -Clinically suspect aspiration pneumonia Antibiotics as above in #1 -As of 11/28, has been weaned off of  BiPAP and so far tolerating it well -ABG with chronic CO2 build up. -continue 9-7D [place].  7)alcoholic liver cirrhosis--requiring recurrent paracenteses -Unable to give torsemide due to respiratory distress requiring continuous BiPAP as well as soft BP -Ammonia noted at 66 -Continue lactulose  8)HFpEF--- last known EF 60 to  65%,  -patient with stable chronic diastolic CHF/nonischemic cardiomyopathy, appears compensated at this time.   -Follow daily weights and strict I's and O's.  8)Social/ethics--patient technically lives alone , but his girlfriend has been technically living with him lately due to weakness and recurrent falls -Patient and girlfriend drink alcohol together--- patient is a full code  -Patient is a full code as per family  9) history of complete heart block--- status post permanent pacemaker placement since 2016, patient follows with Dr. Lovena Le  10)Persistent atrial fibrillation/flutter--- unable to give Coreg due to soft BP, not a candidate for anticoagulation due to recurrent GI bleed and hematuria in an alcoholic male with recurrent falls  11) acute metabolic encephalopathy--multifactorial suspect some component of DTs, also some component of hepatic encephalopathy with elevated ammonia, and CO2 narcosis/hypercapnia in the setting of acute on chronic hypoxic and hypercapnic respiratory failure -Treat underlying etiologies and metabolic encephalopathy should improve -Overall mental status is improving  12)AKI----acute kidney injury on CKD stage IIIb-      -creatinine on admission=1.95  ,   baseline creatinine =1.1 in 08/2019, creatinine is now=2.57, renally adjust medications, avoid nephrotoxic agents. -Continue to follow adequate hydration and avoid hypotension. -Good urine output reported.  13) thrombocytopenia.  Possibly related to alcohol use.   -Continue to follow.   -No signs of bleeding at this time.  14-hypokalemia -Replete electrolytes and follow electrolytes trend.  Disposition/Need for in-Hospital Stay-patient needs continued inpatient treatment for acute on chronic respiratory failure with hypoxia, ongoing ascites and deconditioning process.  Left lower extremity cellulitis and aspiration PNA.  Will transfer to telemetry bed.   Code Status : Full code  Family Communication:     Male Friend at bedside -Discussed with patient's son who lives in [place]  Disposition Plan  : To be determined  Consults  :  na  DVT Prophylaxis  :  Lovenox -  - SCDs   Lab Results  Component Value Date   PLT 103 (L) 11/04/2019   Inpatient Medications  Scheduled Meds:  chlordiazePOXIDE  15 mg Oral TID   chlorhexidine  15 mL Mouth Rinse BID   Chlorhexidine Gluconate Cloth  6 each Topical Daily   enoxaparin (LOVENOX) injection  40 mg Subcutaneous Q24H   fluticasone furoate-vilanterol  1 puff Inhalation Daily   And   umeclidinium bromide  1 puff Inhalation Daily   guaiFENesin  15 mL Oral TID   ipratropium-albuterol  3 mL Nebulization Q6H   lactulose  20 g Oral BID   mouth rinse  15 mL Mouth Rinse q12n4p   methylPREDNISolone (SOLU-MEDROL) injection  125 mg Intravenous Once   methylPREDNISolone (SOLU-MEDROL) injection  40 mg Intravenous Q8H   pantoprazole  40 mg Oral BID   sodium chloride flush  3 mL Intravenous Q12H   Continuous Infusions:  sodium chloride     albumin human     meropenem (MERREM) IV 1 g (11/05/19 1201)   PRN Meds:.sodium chloride, acetaminophen **OR** acetaminophen, albuterol, alum & mag hydroxide-simeth, LORazepam, morphine injection, ondansetron (ZOFRAN) IV, sodium chloride flush  Anti-infectives (From admission, onward)   Start     Dose/Rate Route Frequency Ordered Stop   11/05/19 1200  meropenem (MERREM)  1 g in sodium chloride 0.9 % 100 mL IVPB     1 g 200 mL/hr over 30 Minutes Intravenous Every 12 hours 11/05/19 1045 11/07/19 1159   11/02/19 1200  meropenem (MERREM) 1 g in sodium chloride 0.9 % 100 mL IVPB  Status:  Discontinued     1 g 200 mL/hr over 30 Minutes Intravenous Every 12 hours 11/02/19 1044 11/05/19 1045   11/01/19 0900  Ampicillin-Sulbactam (UNASYN) 3 g in sodium chloride 0.9 % 100 mL IVPB  Status:  Discontinued     3 g 200 mL/hr over 30 Minutes Intravenous Every 8 hours 11/01/19 0843 11/02/19 1023    11/01/19 0630  Ampicillin-Sulbactam (UNASYN) 3 g in sodium chloride 0.9 % 100 mL IVPB  Status:  Discontinued     3 g 200 mL/hr over 30 Minutes Intravenous Every 6 hours 11/01/19 0629 11/01/19 0843   10/31/19 0900  cefTRIAXone (ROCEPHIN) 1 g in sodium chloride 0.9 % 100 mL IVPB  Status:  Discontinued     1 g 200 mL/hr over 30 Minutes Intravenous Every 24 hours 10/31/19 0848 11/01/19 0807        Objective:   Vitals:   11/05/19 1111 11/05/19 1200 11/05/19 1300 11/05/19 1415  BP:  (!) 141/78 132/81   Pulse:  60 (!) 59   Resp: 20 18 (!) 25   Temp: 98.9 F (37.2 C)     TempSrc: Oral     SpO2:  98% 97% 99%  Weight:      Height:        Wt Readings from Last 3 Encounters:  11/05/19 86.2 kg  10/05/19 91.6 kg  09/06/19 91.6 kg    Intake/Output Summary (Last 24 hours) at 11/05/2019 1435 Last data filed at 11/05/2019 1330 Gross per 24 hour  Intake 1498.08 ml  Output 1304 ml  Net 194.08 ml   Physical Exam General exam: Alert, awake, oriented x 2; in no acute distress.  Reports having some burning sensation in his stomach, experiencing mild shortness of breath and demonstrating increased abdominal distention. Respiratory system: No using accessory muscles, positive diffuse rhonchi at, decreased breath sounds at the bases.  Good O2 sat on 2-3 L nasal cannula supplementation. Cardiovascular system:RRR. No murmurs, rubs, gallops. Gastrointestinal system: Abdomen is distended, with positive fluid wave, no guarding, no tenderness on palpation.  Positive ascites.   Central nervous system: Alert and oriented. No focal neurological deficits. Extremities: No cyanosis or clubbing; trace edema bilaterally appreciated. Skin: No petechiae.  Left lower extremity with erythematous appearance, some desquamation and chronic abscesses dermatitis process appreciated on exam. Psychiatry: Mood & affect appropriate.      Data Review:   Micro Results Recent Results (from the past 240 hour(s))   Culture, blood (single)     Status: None   Collection Time: 10/30/19  7:08 PM   Specimen: Right Antecubital; Blood  Result Value Ref Range Status   Specimen Description RIGHT ANTECUBITAL  Final   Special Requests   Final    BOTTLES DRAWN AEROBIC AND ANAEROBIC Blood Culture adequate volume   Culture   Final    NO GROWTH 6 DAYS Performed at [facility], [address], [place], [place] [address]    Report Status 11/05/2019 FINAL  Final  SARS CORONAVIRUS 2 (TAT 6-24 HRS) Nasopharyngeal Nasopharyngeal Swab     Status: None   Collection Time: 10/31/19  7:01 AM   Specimen: Nasopharyngeal Swab  Result Value Ref Range Status   SARS Coronavirus 2 NEGATIVE NEGATIVE  Final    Comment: (NOTE) SARS-CoV-2 target nucleic acids are NOT DETECTED. The SARS-CoV-2 RNA is generally detectable in upper and lower respiratory specimens during the acute phase of infection. Negative results do not preclude SARS-CoV-2 infection, do not rule out co-infections with other pathogens, and should not be used as the sole basis for treatment or other patient management decisions. Negative results must be combined with clinical observations, patient history, and epidemiological information. The expected result is Negative. Fact Sheet for Patients: SugarRoll.be Fact Sheet for Healthcare Providers: https://www.woods-mathews.com/ This test is not yet approved or cleared by the [place] FDA and  has been authorized for detection and/or diagnosis of SARS-CoV-2 by FDA under an Emergency Use Authorization (EUA). This EUA will remain  in effect (meaning this test can be used) for the duration of the COVID-19 declaration under Section 56 4(b)(1) of the Act, 21 U.S.C. section 360bbb-3(b)(1), unless the authorization is terminated or revoked sooner. Performed at [facility], [place] [address] [place], [place] [address]   MRSA PCR Screening     Status: None   Collection  Time: 10/31/19  5:59 PM   Specimen: Nasal Mucosa; Nasopharyngeal  Result Value Ref Range Status   MRSA by PCR NEGATIVE NEGATIVE Final    Comment:        The GeneXpert MRSA Assay (FDA approved for NASAL specimens only), is one component of a comprehensive MRSA colonization surveillance program. It is not intended to diagnose MRSA infection nor to guide or monitor treatment for MRSA infections. Performed at [facility], [address] [place], [place] [address]   Culture, blood (Routine X 2) w Reflex to ID Panel     Status: None (Preliminary result)   Collection Time: 11/02/19  9:16 AM   Specimen: BLOOD LEFT HAND  Result Value Ref Range Status   Specimen Description   Final    BLOOD LEFT HAND BOTTLES DRAWN AEROBIC AND ANAEROBIC   Special Requests Blood Culture adequate volume  Final   Culture   Final    NO GROWTH 3 DAYS Performed at [facility], [address], [place], [place] [address]    Report Status PENDING  Incomplete  Culture, blood (Routine X 2) w Reflex to ID Panel     Status: None (Preliminary result)   Collection Time: 11/02/19 11:09 AM   Specimen: BLOOD  Result Value Ref Range Status   Specimen Description BLOOD BLOOD LEFT WRIST  Final   Special Requests   Final    BOTTLES DRAWN AEROBIC AND ANAEROBIC Blood Culture adequate volume   Culture   Final    NO GROWTH 3 DAYS Performed at [facility], [address] [place], [place] [address]    Report Status PENDING  Incomplete    Radiology Reports Dg Chest 2 View  Result Date: 10/30/2019 CLINICAL DATA:  Lower extremity redness, swelling and increased fluid retention, more confusion evening, multiple falls this past weekend, laid on floor for hours, history smoking, pacemaker, atrial fibrillation, COPD, chronic kidney disease, hypertension, CHF, RIGHT upper lobe lung cancer EXAM: CHEST - 2 VIEW COMPARISON:  08/10/2019 Correlation: CT angio chest 05/17/2018 FINDINGS: LEFT subclavian sequential transvenous pacemaker  leads project at RIGHT atrium and RIGHT ventricle. Normal heart size, mediastinal contours, and pulmonary vascularity. Atherosclerotic calcification aorta. Scarring in RIGHT upper lobe with associated stellate opacity, question post radiation therapy change; this has increased since the previous study. Atelectasis versus consolidation LEFT lower lobe. Remaining lungs clear. Small LEFT and tiny RIGHT pleural effusions.  No pneumothorax or acute osseous findings. IMPRESSION: Atelectasis versus consolidation LEFT lower lobe with associated LEFT pleural effusion. Increased delayed opacity in the RIGHT upper lobe question progressive post radiation therapy changes. Tiny RIGHT pleural effusion. Aortic Atherosclerosis (ICD10-I70.0). Electronically Signed   By: Lavonia Dana M.D.   On: 10/30/2019 14:58   US Venous Img Lower Unilateral Left (dvt)  Result Date: 10/30/2019 CLINICAL DATA:  68 year old male with left lower extremity redness and swelling for 1 week EXAM: LEFT LOWER EXTREMITY VENOUS DOPPLER ULTRASOUND TECHNIQUE: Gray-scale sonography with graded compression, as well as color Doppler and duplex ultrasound were performed to evaluate the lower extremity deep venous systems from the level of the common femoral vein and including the common femoral, femoral, profunda femoral, popliteal and calf veins including the posterior tibial, peroneal and gastrocnemius veins when visible. The superficial great saphenous vein was also interrogated. Spectral Doppler was utilized to evaluate flow at rest and with distal augmentation maneuvers in the common femoral, femoral and popliteal veins. COMPARISON:  None. FINDINGS: Contralateral Common Femoral Vein: Respiratory phasicity is normal and symmetric with the symptomatic side. No evidence of thrombus. Normal compressibility. Common Femoral Vein: No evidence of thrombus. Normal compressibility, respiratory phasicity and response to augmentation. Saphenofemoral Junction: No  evidence of thrombus. Normal compressibility and flow on color Doppler imaging. Profunda Femoral Vein: No evidence of thrombus. Normal compressibility and flow on color Doppler imaging. Femoral Vein: No evidence of thrombus. Normal compressibility, respiratory phasicity and response to augmentation. Popliteal Vein: No evidence of thrombus. Normal compressibility, respiratory phasicity and response to augmentation. Calf Veins: No evidence of thrombus. Normal compressibility and flow on color Doppler imaging. Superficial Great Saphenous Vein: No evidence of thrombus. Normal compressibility. Venous Reflux:  None. Other Findings:  None. IMPRESSION: No evidence of deep venous thrombosis. Electronically Signed   By: Jacqulynn Cadet M.D.   On: 10/30/2019 14:45   US Paracentesis  Result Date: 10/24/2019 INDICATION: Cirrhosis, ascites EXAM: ULTRASOUND GUIDED DIAGNOSTIC AND THERAPEUTIC PARACENTESIS MEDICATIONS: NONE COMPLICATIONS: NONE IMMEDIATE PROCEDURE: Informed written consent was obtained from the patient after a discussion of the risks, benefits and alternatives to treatment. A timeout was performed prior to the initiation of the procedure. Initial ultrasound scanning demonstrates a moderate amount of ascites within the LEFT lower abdominal quadrant. The right lower abdomen was prepped and draped in the usual sterile fashion. 1% lidocaine was used for local anesthesia. Following this, a 5 [place] Yueh catheter was introduced. An ultrasound image was saved for documentation purposes. The paracentesis was performed. The catheter was removed and a dressing was applied. The patient tolerated the procedure well without immediate post procedural complication. Patient received post-procedure intravenous albumin; see nursing notes for details. FINDINGS: A total of approximately 2.9 L of amber colored ascitic fluid was removed. Samples were sent to the laboratory as requested by the clinical team. IMPRESSION: Successful  ultrasound-guided paracentesis yielding 2.9 liters of peritoneal fluid. Electronically Signed   By: Lavonia Dana M.D.   On: 10/24/2019 16:07   Dg Chest Port 1 View  Result Date: 11/02/2019 CLINICAL DATA:  Respiratory failure EXAM: PORTABLE CHEST 1 VIEW COMPARISON:  Radiograph 11/01/2019 FINDINGS: Cardiac pacer pack overlies the left chest wall. Pacer lead positioning is unchanged from prior. Surgical clips in the right upper lobe and a spiculated mass lesion in the right upper lung are similar prior accounting for differences in positioning. There is slightly increasing bilateral airspace disease most pronounced to the infrahilar lungs. Cardiomediastinal contours including a calcified aorta are stable  from prior. No pre or paravertebral fluid or swelling. No visible canal hematoma. IMPRESSION: 1. Slightly increasing bilateral airspace disease most pronounced in the infrahilar lungs. Possibly worsening edema or infection. 2. Stable postoperative changes and spiculated mass in the right upper lobe. 3. Prior CABG. 4.  Aortic Atherosclerosis (ICD10-I70.0). Electronically Signed   By: Lovena Le M.D.   On: 11/02/2019 07:02   Dg Chest Port 1 View  Result Date: 11/01/2019 CLINICAL DATA:  Hypoxia. EXAM: PORTABLE CHEST 1 VIEW COMPARISON:  10/30/2019 FINDINGS: Dual lead pacemaker unchanged. Heart size upper normal. Vascular congestion has progressed. Progressive left lower lobe airspace disease and small left effusion. No effusion on the right. Surgical clips in the right upper lobe with spiculated mass lesion unchanged from prior study. IMPRESSION: Progression of vascular congestion. Progressive left lower lobe airspace disease and left effusion. Findings suggestive of fluid overload however pneumonia in the left lower lobe is possible. Electronically Signed   By: Franchot Gallo M.D.   On: 11/01/2019 06:50     CBC Recent Labs  Lab 10/30/19 1302 11/01/19 0640 11/02/19 0626 11/03/19 0412 11/04/19 0448   WBC 9.2 11.7* 4.2 10.9* 20.7*  HGB 10.5* 11.0* 10.2* 10.7* 11.2*  HCT 35.2* 38.2* 34.3* 34.9* 36.6*  PLT 132* 141* 83* 89* 103*  MCV 98.9 101.1* 99.4 96.7 95.6  MCH 29.5 29.1 29.6 29.6 29.2  MCHC 29.8* 28.8* 29.7* 30.7 30.6  RDW 16.8* 16.6* 16.4* 16.0* 16.4*  LYMPHSABS 0.2* 0.2*  --   --   --   MONOABS 0.2 0.4  --   --   --   EOSABS 0.0 0.0  --   --   --   BASOSABS 0.0 0.0  --   --   --     Chemistries  Recent Labs  Lab 10/30/19 1302  11/01/19 0449 11/01/19 0640 11/02/19 0626 11/03/19 0412 11/04/19 0448 11/05/19 0650  NA 132*   < > 133*  --  134* 138 138 141  K 3.1*   < > 3.6  --  3.9 3.5 3.2* 3.1*  CL 87*   < > 88*  --  94* 92* 93* 94*  CO2 32   < > 31  --  30 33* 34* 36*  GLUCOSE 90   < > 164*  --  160* 103* 96 114*  BUN 62*   < > 71*  --  74* 76* 66* 64*  CREATININE 1.95*   < > 2.41*  --  2.58* 2.57* 1.91* 1.65*  CALCIUM 7.0*   < > 7.2*  --  6.8* 7.2* 7.2* 7.6*  MG  --   --   --  1.4*  --   --   --   --   AST 34  --  28  --  23  --  27 35  ALT 18  --  15  --  14  --  13 16  ALKPHOS 86  --  76  --  65  --  61 77  BILITOT 2.0*  --  1.5*  --  1.4*  --  1.9* 2.4*   < > = values in this interval not displayed.   Coagulation profile Recent Labs  Lab 10/30/19 1302  INR 1.4*    Cardiac Enzymes No results for input(s): CKMB, TROPONINI, MYOGLOBIN in the last 168 hours.  Invalid input(s): CK ------------------------------------------------------------------------------------------------------------------    Component Value Date/Time   BNP 601.0 (H) 08/10/2019 7564     Barton Dubois M.D on  11/05/2019 at 2:35 PM  Go to [email or web address] - for contact info  [facility] - Office  [phone]

## 2019-11-05 NOTE — Progress Notes (Signed)
Put Dreamstation in patient's room to run as Bipap on setting of 16/8 which are the same settings patient was using in  ICU.  Patient's RN explained that she had just given patient medication for N/V.  Giving patient time to feel better from medication.  RN to let me know if she puts patient on machine.  No respiratory distress noted, will continue to monitor patient.

## 2019-11-06 DIAGNOSIS — R5381 Other malaise: Secondary | ICD-10-CM

## 2019-11-06 LAB — CBC WITH DIFFERENTIAL/PLATELET
Abs Immature Granulocytes: 0.14 10*3/uL — ABNORMAL HIGH (ref 0.00–0.07)
Basophils Absolute: 0 10*3/uL (ref 0.0–0.1)
Basophils Relative: 0 %
Eosinophils Absolute: 0 10*3/uL (ref 0.0–0.5)
Eosinophils Relative: 0 %
HCT: 32.1 % — ABNORMAL LOW (ref 39.0–52.0)
Hemoglobin: 9.7 g/dL — ABNORMAL LOW (ref 13.0–17.0)
Immature Granulocytes: 1 %
Lymphocytes Relative: 0 %
Lymphs Abs: 0.1 10*3/uL — ABNORMAL LOW (ref 0.7–4.0)
MCH: 29.6 pg (ref 26.0–34.0)
MCHC: 30.2 g/dL (ref 30.0–36.0)
MCV: 97.9 fL (ref 80.0–100.0)
Monocytes Absolute: 0.5 10*3/uL (ref 0.1–1.0)
Monocytes Relative: 3 %
Neutro Abs: 17.6 10*3/uL — ABNORMAL HIGH (ref 1.7–7.7)
Neutrophils Relative %: 96 %
Platelets: 119 10*3/uL — ABNORMAL LOW (ref 150–400)
RBC: 3.28 MIL/uL — ABNORMAL LOW (ref 4.22–5.81)
RDW: 16.7 % — ABNORMAL HIGH (ref 11.5–15.5)
WBC: 18.4 10*3/uL — ABNORMAL HIGH (ref 4.0–10.5)
nRBC: 0.2 % (ref 0.0–0.2)

## 2019-11-06 LAB — GLUCOSE, CAPILLARY
Glucose-Capillary: 109 mg/dL — ABNORMAL HIGH (ref 70–99)
Glucose-Capillary: 116 mg/dL — ABNORMAL HIGH (ref 70–99)
Glucose-Capillary: 117 mg/dL — ABNORMAL HIGH (ref 70–99)
Glucose-Capillary: 122 mg/dL — ABNORMAL HIGH (ref 70–99)
Glucose-Capillary: 127 mg/dL — ABNORMAL HIGH (ref 70–99)
Glucose-Capillary: 139 mg/dL — ABNORMAL HIGH (ref 70–99)

## 2019-11-06 MED ORDER — SPIRONOLACTONE 25 MG PO TABS
50.0000 mg | ORAL_TABLET | Freq: Every day | ORAL | Status: DC
  Administered 2019-11-06 – 2019-11-07 (×2): 50 mg via ORAL
  Filled 2019-11-06 (×2): qty 2

## 2019-11-06 MED ORDER — FUROSEMIDE 20 MG PO TABS
20.0000 mg | ORAL_TABLET | Freq: Every day | ORAL | Status: DC
  Administered 2019-11-06 – 2019-11-07 (×2): 20 mg via ORAL
  Filled 2019-11-06 (×2): qty 1

## 2019-11-06 MED ORDER — METHYLPREDNISOLONE SODIUM SUCC 40 MG IJ SOLR
40.0000 mg | Freq: Two times a day (BID) | INTRAMUSCULAR | Status: DC
  Administered 2019-11-06 – 2019-11-07 (×2): 40 mg via INTRAVENOUS
  Filled 2019-11-06 (×2): qty 1

## 2019-11-06 MED ORDER — POTASSIUM CHLORIDE CRYS ER 20 MEQ PO TBCR
40.0000 meq | EXTENDED_RELEASE_TABLET | Freq: Once | ORAL | Status: AC
  Administered 2019-11-06: 17:00:00 40 meq via ORAL
  Filled 2019-11-06: qty 2

## 2019-11-06 NOTE — TOC Progression Note (Signed)
Transition of Care [employer] [facility]) - Progression Note    Patient Details  Name: Shaun May MRN: [id number] Date of Birth: Feb 07, 1951  Transition of Care [facility]) CM/SW Contact  Wylder Macomber, Chauncey Reading, RN Phone Number: 11/06/2019, 1:44 PM  Clinical Narrative:   Discussed bed offer with son and Santiago Glad. Both agreeable to [facility], which is only bed offer. Anticipate DC tomorrow. Debbie of [place] aware.     Expected Discharge Plan: [place] Barriers to Discharge: Continued Medical Work up  Expected Discharge Plan and Services Expected Discharge Plan: [place] arrangements for the past 2 months: [place] Determinants of Health (SDOH) Interventions    Readmission Risk Interventions Readmission Risk Prevention Plan 10/31/2019  Transportation Screening Complete  PCP or Specialist Appt within 3-5 Days Not Complete  HRI or [place] Complete  Social Work Consult for [place] Planning/Counseling Complete  Palliative Care Screening Not Complete  Medication Review [employer]) Complete  Some recent data might be hidden

## 2019-11-06 NOTE — Progress Notes (Signed)
Patient Demographics:    Shaun May, is a 68 y.o. male, DOB - Apr 24, 1951, LJQ:[id number]  Admit date - 10/30/2019   Admitting Physician Neena Rhymes, MD  Outpatient Primary MD for the patient is Celene Squibb, MD  LOS - 7   Chief Complaint  Patient presents with   Recurrent Skin Infections        Subjective:    Shaun May slowly improving.  Still short of breath with minimal exertion and expressing being weak and easily fatigued.  Frail and deconditioned on exam.  Using 2-3 L nasal cannula supplementation.  Reports no chest pain, no nausea, no vomiting, no palpitations. Patient is afebrile. Oriented X 2 and following commands appropriately.   Assessment  & Plan :    Principal Problem:   Sepsis ([place]) Active Problems:   Hypertension   COPD GOLD IV/ 02 dep at hs/ still smoking    Atrial flutter (HCC), paroxysmal   Cirrhosis of liver with ascites (HCC)   Cellulitis of left leg/Wound Infection   ETOH abuse   Acute on chronic respiratory failure with hypoxia (HCC)   (HFpEF) heart failure with preserved ejection fraction/EF 21-97%/ Chronic Diastolic Dysfunction CHF   Thrombocytopenia (HCC)   Pressure injury of skin  Brief Summary:- 68 y.o. 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  Chronic venous insufficiency, history of left leg cellulitis, history of cirrhosis with ascites status post paracentesis in the last month.  Patient lives alone with an intermittent visitation by a girlfriend.  According to the patient's son he is irregular in taking his medications as instructed -Lethargy and respiratory distress on 11/01/2019 transferred to ICU on BiPAP  A/p  1) sepsis secondary to Lt Leg Cellulitis/Wound infection --- open wounds on left  lower extremity- with secondary infection-pneumonia may be contributing to patient's septic picture as well -Sepsis pathophysiology exhibited by hypothermia, altered mentation, hypotension and worsening renal perfusion, leukocytosis appears to be resolving -MRSA PCR Neg -10/30/2019 NGTD -WBC is down to 10.9 from 11.7, but patient is also on steroids -Given hypothermia blood cultures repeated on 11/02/2019, but have shown no growth -IV antibiotics switched from Unasyn to meropenem; 1 more day of antibiotic pending. -continue to follow response.   2)Presumed pneumonia- may be aspiration related antibiotics as above #1,  -He has been weaned off of BiPAP and is back on nasal cannula -Continue intravenous antibiotics for now  3) alcohol withdrawal with delirium tremens.   -Patient placed on Precedex drip with improvement of agitation. -Weaned off precedex on 11/04/19 -started on librium now; planning for slow tapering..  -cessation counseling provided. -continue thiamine and folic acid.   4) generalized weakness and deconditioning--recurrent falls at home -Physical therapy evaluated patient previously recommended SNF rehab -follow improvement and placement.   5) COPD and tobacco abuse--continues to smoke despite being on home O2 -continue bronchodilators and steroids tapering. -Breathing much better; even if still short of breath on exertion.  6)Acute on chronic respiratory failure with hypoxia and hypercapnia --- secondary to #5 above, PTA patient used 2 to 3 L of oxygen at home continue oxygen supplementation -Acute on chronic respiratory  failure secondary to presumed aspiration event on 10/31/2019 required BiPAP; Chest x-ray on 11/01/2019 and 11/02/2019 Noted -CHF versus pneumonia  -Clinically suspect aspiration pneumonia Antibiotics as above in #1 -As of 11/28, has been weaned off of BiPAP and so far tolerating it well -ABG with chronic CO2 build up. -continue 4-1D [place].  7)alcoholic  liver cirrhosis--requiring recurrent paracenteses -Unable to give torsemide due to respiratory distress requiring continuous BiPAP as well as soft BP -Continue lactulose adjusted dose -Patient mentation close to baseline. -Patient will be started on Lasix and Aldactone -Status post paracentesis on 11/05/2019 (2.3 L removed).  8)HFpEF--- last known EF 60 to 65%,  -patient with stable chronic diastolic CHF/nonischemic cardiomyopathy, appears compensated at this time.   -Follow daily weights and strict I's and O's.  8)Social/ethics--patient technically lives alone , but his girlfriend has been technically living with him lately due to weakness and recurrent falls -Patient and girlfriend drink alcohol together--- patient is a full code  -Patient is a full code as per family  9) history of complete heart block--- status post permanent pacemaker placement since 2016, patient follows with Dr. Lovena Le  10)Persistent atrial fibrillation/flutter--- unable to give Coreg due to soft BP, not a candidate for anticoagulation due to recurrent GI bleed and hematuria in an alcoholic male with recurrent falls  11) acute metabolic encephalopathy--multifactorial suspect some component of DTs, also some component of hepatic encephalopathy with elevated ammonia, and CO2 narcosis/hypercapnia in the setting of acute on chronic hypoxic and hypercapnic respiratory failure -Treat underlying etiologies and metabolic encephalopathy should improve -Overall mental status is improving  12)AKI----acute kidney injury on CKD stage IIIb-      -creatinine on admission=1.95  ,   baseline creatinine =1.1 in 08/2019, creatinine is now=2.57, renally adjust medications, avoid nephrotoxic agents. -Continue to follow adequate hydration and avoid hypotension. -Good urine output reported.  13) thrombocytopenia.  Possibly related to alcohol use.   -Continue to follow.   -No signs of bleeding at this time.  14-hypokalemia -Replete  electrolytes and follow electrolytes trend. -Started on spironolactone.  Disposition/Need for in-Hospital Stay-patient needs continued inpatient treatment for acute on chronic respiratory failure with hypoxia, ongoing ascites and deconditioning process.  Left lower extremity cellulitis and aspiration PNA.  Will complete antibiotic therapy today; initiate treatment with the spironolactone and Lasix.  Advance diet further and assess response.   Code Status : Full code  Family Communication:    Male Friend at bedside -Discussed with patient's son who lives in [place]  Disposition Plan  : To be determined  Consults  :  na  DVT Prophylaxis  :  Lovenox -  - SCDs   Lab Results  Component Value Date   PLT 119 (L) 11/06/2019   Inpatient Medications  Scheduled Meds:  chlordiazePOXIDE  15 mg Oral TID   chlorhexidine  15 mL Mouth Rinse BID   Chlorhexidine Gluconate Cloth  6 each Topical Daily   enoxaparin (LOVENOX) injection  40 mg Subcutaneous Q24H   fluticasone furoate-vilanterol  1 puff Inhalation Daily   And   umeclidinium bromide  1 puff Inhalation Daily   furosemide  20 mg Oral Daily   guaiFENesin  15 mL Oral TID   ipratropium-albuterol  3 mL Nebulization TID   lactulose  20 g Oral BID   mouth rinse  15 mL Mouth Rinse q12n4p   methylPREDNISolone (SOLU-MEDROL) injection  125 mg Intravenous Once   methylPREDNISolone (SOLU-MEDROL) injection  40 mg Intravenous Q12H   pantoprazole  40 mg  Oral BID   sodium chloride flush  3 mL Intravenous Q12H   spironolactone  50 mg Oral Daily   Continuous Infusions:  sodium chloride     meropenem (MERREM) IV 1 g (11/06/19 1226)   PRN Meds:.sodium chloride, acetaminophen **OR** acetaminophen, albuterol, alum & mag hydroxide-simeth, LORazepam, ondansetron (ZOFRAN) IV, sodium chloride flush  Anti-infectives (From admission, onward)   Start     Dose/Rate Route Frequency Ordered Stop   11/05/19 1200  meropenem (MERREM) 1  g in sodium chloride 0.9 % 100 mL IVPB     1 g 200 mL/hr over 30 Minutes Intravenous Every 12 hours 11/05/19 1045 11/07/19 1159   11/02/19 1200  meropenem (MERREM) 1 g in sodium chloride 0.9 % 100 mL IVPB  Status:  Discontinued     1 g 200 mL/hr over 30 Minutes Intravenous Every 12 hours 11/02/19 1044 11/05/19 1045   11/01/19 0900  Ampicillin-Sulbactam (UNASYN) 3 g in sodium chloride 0.9 % 100 mL IVPB  Status:  Discontinued     3 g 200 mL/hr over 30 Minutes Intravenous Every 8 hours 11/01/19 0843 11/02/19 1023   11/01/19 0630  Ampicillin-Sulbactam (UNASYN) 3 g in sodium chloride 0.9 % 100 mL IVPB  Status:  Discontinued     3 g 200 mL/hr over 30 Minutes Intravenous Every 6 hours 11/01/19 0629 11/01/19 0843   10/31/19 0900  cefTRIAXone (ROCEPHIN) 1 g in sodium chloride 0.9 % 100 mL IVPB  Status:  Discontinued     1 g 200 mL/hr over 30 Minutes Intravenous Every 24 hours 10/31/19 0848 11/01/19 0807        Objective:   Vitals:   11/06/19 0808 11/06/19 0818 11/06/19 1200 11/06/19 1438  BP:   131/73   Pulse:   (!) 59   Resp:   18   Temp:      TempSrc:      SpO2: 98% 100% 100% 95%  Weight:      Height:        Wt Readings from Last 3 Encounters:  11/05/19 82.8 kg  10/05/19 91.6 kg  09/06/19 91.6 kg    Intake/Output Summary (Last 24 hours) at 11/06/2019 1547 Last data filed at 11/06/2019 0900 Gross per 24 hour  Intake 930 ml  Output 1400 ml  Net -470 ml   Physical Exam General exam: Alert, awake, oriented x 2; afebrile, in no acute distress.  Reports improvement in abdominal distention and pain.  No nausea, no vomiting.  Well-tolerated paracentesis on 11/05/2019. Respiratory system: Positive rhonchi bilaterally; no using accessory muscle.  Decreased breath sounds at the bases.  Good O2 sat on 2 L nasal cannula supplementation. Cardiovascular system:RRR. No murmurs, rubs, gallops. Gastrointestinal system: Abdomen is less distended, softer and nontender on palpation. Normal bowel  sounds heard. Central nervous system: Alert and oriented. No focal neurological deficits. Extremities/skin: No cyanosis or clubbing; trace edema bilaterally appreciated.  No petechiae.  Left lower extremity with improvement in erythematosus appearance, some desquamation and chronic stasis dermatitis appreciated on exam. Psychiatry: Mood & affect appropriate.     Data Review:   Micro Results Recent Results (from the past 240 hour(s))  Culture, blood (single)     Status: None   Collection Time: 10/30/19  7:08 PM   Specimen: Right Antecubital; Blood  Result Value Ref Range Status   Specimen Description RIGHT ANTECUBITAL  Final   Special Requests   Final    BOTTLES DRAWN AEROBIC AND ANAEROBIC Blood Culture adequate volume   Culture  Final    NO GROWTH 6 DAYS Performed at [facility], [address] [place], [place] [address]    Report Status 11/05/2019 FINAL  Final  SARS CORONAVIRUS 2 (TAT 6-24 HRS) Nasopharyngeal Nasopharyngeal Swab     Status: None   Collection Time: 10/31/19  7:01 AM   Specimen: Nasopharyngeal Swab  Result Value Ref Range Status   SARS Coronavirus 2 NEGATIVE NEGATIVE Final    Comment: (NOTE) SARS-CoV-2 target nucleic acids are NOT DETECTED. The SARS-CoV-2 RNA is generally detectable in upper and lower respiratory specimens during the acute phase of infection. Negative results do not preclude SARS-CoV-2 infection, do not rule out co-infections with other pathogens, and should not be used as the sole basis for treatment or other patient management decisions. Negative results must be combined with clinical observations, patient history, and epidemiological information. The expected result is Negative. Fact Sheet for Patients: SugarRoll.be Fact Sheet for Healthcare Providers: https://www.woods-mathews.com/ This test is not yet approved or cleared by the [place] FDA and  has been authorized for detection and/or  diagnosis of SARS-CoV-2 by FDA under an Emergency Use Authorization (EUA). This EUA will remain  in effect (meaning this test can be used) for the duration of the COVID-19 declaration under Section 56 4(b)(1) of the Act, 21 U.S.C. section 360bbb-3(b)(1), unless the authorization is terminated or revoked sooner. Performed at [facility], [place] [address] [place], [place] [address]   MRSA PCR Screening     Status: None   Collection Time: 10/31/19  5:59 PM   Specimen: Nasal Mucosa; Nasopharyngeal  Result Value Ref Range Status   MRSA by PCR NEGATIVE NEGATIVE Final    Comment:        The GeneXpert MRSA Assay (FDA approved for NASAL specimens only), is one component of a comprehensive MRSA colonization surveillance program. It is not intended to diagnose MRSA infection nor to guide or monitor treatment for MRSA infections. Performed at [facility], [address] [place], [place] [address]   Culture, blood (Routine X 2) w Reflex to ID Panel     Status: None (Preliminary result)   Collection Time: 11/02/19  9:16 AM   Specimen: BLOOD LEFT HAND  Result Value Ref Range Status   Specimen Description   Final    BLOOD LEFT HAND BOTTLES DRAWN AEROBIC AND ANAEROBIC   Special Requests Blood Culture adequate volume  Final   Culture   Final    NO GROWTH 4 DAYS Performed at [facility], [address] [place], [place] [address]    Report Status PENDING  Incomplete  Culture, blood (Routine X 2) w Reflex to ID Panel     Status: None (Preliminary result)   Collection Time: 11/02/19 11:09 AM   Specimen: BLOOD  Result Value Ref Range Status   Specimen Description BLOOD BLOOD LEFT WRIST  Final   Special Requests   Final    BOTTLES DRAWN AEROBIC AND ANAEROBIC Blood Culture adequate volume   Culture   Final    NO GROWTH 4 DAYS Performed at [facility], [address] [place], [place] [address]    Report Status PENDING  Incomplete    Radiology Reports Dg Chest 2 View  Result  Date: 10/30/2019 CLINICAL DATA:  Lower extremity redness, swelling and increased fluid retention, more confusion evening, multiple falls this past weekend, laid on floor for hours, history smoking, pacemaker, atrial fibrillation, COPD, chronic kidney disease, hypertension, CHF, RIGHT upper lobe lung cancer EXAM: CHEST - 2 VIEW  COMPARISON:  08/10/2019 Correlation: CT angio chest 05/17/2018 FINDINGS: LEFT subclavian sequential transvenous pacemaker leads project at RIGHT atrium and RIGHT ventricle. Normal heart size, mediastinal contours, and pulmonary vascularity. Atherosclerotic calcification aorta. Scarring in RIGHT upper lobe with associated stellate opacity, question post radiation therapy change; this has increased since the previous study. Atelectasis versus consolidation LEFT lower lobe. Remaining lungs clear. Small LEFT and tiny RIGHT pleural effusions. No pneumothorax or acute osseous findings. IMPRESSION: Atelectasis versus consolidation LEFT lower lobe with associated LEFT pleural effusion. Increased delayed opacity in the RIGHT upper lobe question progressive post radiation therapy changes. Tiny RIGHT pleural effusion. Aortic Atherosclerosis (ICD10-I70.0). Electronically Signed   By: Lavonia Dana M.D.   On: 10/30/2019 14:58   US Venous Img Lower Unilateral Left (dvt)  Result Date: 10/30/2019 CLINICAL DATA:  68 year old male with left lower extremity redness and swelling for 1 week EXAM: LEFT LOWER EXTREMITY VENOUS DOPPLER ULTRASOUND TECHNIQUE: Gray-scale sonography with graded compression, as well as color Doppler and duplex ultrasound were performed to evaluate the lower extremity deep venous systems from the level of the common femoral vein and including the common femoral, femoral, profunda femoral, popliteal and calf veins including the posterior tibial, peroneal and gastrocnemius veins when visible. The superficial great saphenous vein was also interrogated. Spectral Doppler was utilized to  evaluate flow at rest and with distal augmentation maneuvers in the common femoral, femoral and popliteal veins. COMPARISON:  None. FINDINGS: Contralateral Common Femoral Vein: Respiratory phasicity is normal and symmetric with the symptomatic side. No evidence of thrombus. Normal compressibility. Common Femoral Vein: No evidence of thrombus. Normal compressibility, respiratory phasicity and response to augmentation. Saphenofemoral Junction: No evidence of thrombus. Normal compressibility and flow on color Doppler imaging. Profunda Femoral Vein: No evidence of thrombus. Normal compressibility and flow on color Doppler imaging. Femoral Vein: No evidence of thrombus. Normal compressibility, respiratory phasicity and response to augmentation. Popliteal Vein: No evidence of thrombus. Normal compressibility, respiratory phasicity and response to augmentation. Calf Veins: No evidence of thrombus. Normal compressibility and flow on color Doppler imaging. Superficial Great Saphenous Vein: No evidence of thrombus. Normal compressibility. Venous Reflux:  None. Other Findings:  None. IMPRESSION: No evidence of deep venous thrombosis. Electronically Signed   By: Jacqulynn Cadet M.D.   On: 10/30/2019 14:45   US Paracentesis  Result Date: 11/05/2019 INDICATION: Cirrhosis of liver with ascites EXAM: ULTRASOUND GUIDED THERAPEUTIC PARACENTESIS MEDICATIONS: None COMPLICATIONS: None immediate PROCEDURE: Informed written consent was obtained from the patient after a discussion of the risks, benefits and alternatives to treatment. A timeout was performed prior to the initiation of the procedure. Initial ultrasound scanning demonstrates a large amount of ascites within the right lower abdominal quadrant. The right lower abdomen was prepped and draped in the usual sterile fashion. 1% lidocaine was used for local anesthesia. Following this, a 5 [place] Yueh catheter was introduced. An ultrasound image was saved for documentation  purposes. The paracentesis was performed. The catheter was removed and a dressing was applied. The patient tolerated the procedure well without immediate post procedural complication. Patient received post-procedure intravenous albumin; see nursing notes for details. FINDINGS: A total of approximately 2.3 L of amber colored ascitic fluid was removed. IMPRESSION: Successful ultrasound-guided paracentesis yielding 2.3 liters of peritoneal fluid. Electronically Signed   By: Lavonia Dana M.D.   On: 11/05/2019 17:21   US Paracentesis  Result Date: 10/24/2019 INDICATION: Cirrhosis, ascites EXAM: ULTRASOUND GUIDED DIAGNOSTIC AND THERAPEUTIC PARACENTESIS MEDICATIONS: NONE COMPLICATIONS: NONE IMMEDIATE PROCEDURE: Informed written consent was  obtained from the patient after a discussion of the risks, benefits and alternatives to treatment. A timeout was performed prior to the initiation of the procedure. Initial ultrasound scanning demonstrates a moderate amount of ascites within the LEFT lower abdominal quadrant. The right lower abdomen was prepped and draped in the usual sterile fashion. 1% lidocaine was used for local anesthesia. Following this, a 5 [place] Yueh catheter was introduced. An ultrasound image was saved for documentation purposes. The paracentesis was performed. The catheter was removed and a dressing was applied. The patient tolerated the procedure well without immediate post procedural complication. Patient received post-procedure intravenous albumin; see nursing notes for details. FINDINGS: A total of approximately 2.9 L of amber colored ascitic fluid was removed. Samples were sent to the laboratory as requested by the clinical team. IMPRESSION: Successful ultrasound-guided paracentesis yielding 2.9 liters of peritoneal fluid. Electronically Signed   By: Lavonia Dana M.D.   On: 10/24/2019 16:07   Dg Chest Port 1 View  Result Date: 11/02/2019 CLINICAL DATA:  Respiratory failure EXAM: PORTABLE CHEST 1  VIEW COMPARISON:  Radiograph 11/01/2019 FINDINGS: Cardiac pacer pack overlies the left chest wall. Pacer lead positioning is unchanged from prior. Surgical clips in the right upper lobe and a spiculated mass lesion in the right upper lung are similar prior accounting for differences in positioning. There is slightly increasing bilateral airspace disease most pronounced to the infrahilar lungs. Cardiomediastinal contours including a calcified aorta are stable from prior. No pre or paravertebral fluid or swelling. No visible canal hematoma. IMPRESSION: 1. Slightly increasing bilateral airspace disease most pronounced in the infrahilar lungs. Possibly worsening edema or infection. 2. Stable postoperative changes and spiculated mass in the right upper lobe. 3. Prior CABG. 4.  Aortic Atherosclerosis (ICD10-I70.0). Electronically Signed   By: Lovena Le M.D.   On: 11/02/2019 07:02   Dg Chest Port 1 View  Result Date: 11/01/2019 CLINICAL DATA:  Hypoxia. EXAM: PORTABLE CHEST 1 VIEW COMPARISON:  10/30/2019 FINDINGS: Dual lead pacemaker unchanged. Heart size upper normal. Vascular congestion has progressed. Progressive left lower lobe airspace disease and small left effusion. No effusion on the right. Surgical clips in the right upper lobe with spiculated mass lesion unchanged from prior study. IMPRESSION: Progression of vascular congestion. Progressive left lower lobe airspace disease and left effusion. Findings suggestive of fluid overload however pneumonia in the left lower lobe is possible. Electronically Signed   By: Franchot Gallo M.D.   On: 11/01/2019 06:50     CBC Recent Labs  Lab 11/01/19 0640 11/02/19 0626 11/03/19 0412 11/04/19 0448 11/06/19 0550  WBC 11.7* 4.2 10.9* 20.7* 18.4*  HGB 11.0* 10.2* 10.7* 11.2* 9.7*  HCT 38.2* 34.3* 34.9* 36.6* 32.1*  PLT 141* 83* 89* 103* 119*  MCV 101.1* 99.4 96.7 95.6 97.9  MCH 29.1 29.6 29.6 29.2 29.6  MCHC 28.8* 29.7* 30.7 30.6 30.2  RDW 16.6* 16.4* 16.0*  16.4* 16.7*  LYMPHSABS 0.2*  --   --   --  0.1*  MONOABS 0.4  --   --   --  0.5  EOSABS 0.0  --   --   --  0.0  BASOSABS 0.0  --   --   --  0.0    Chemistries  Recent Labs  Lab 11/01/19 0449 11/01/19 0640 11/02/19 0626 11/03/19 0412 11/04/19 0448 11/05/19 0650  NA 133*  --  134* 138 138 141  K 3.6  --  3.9 3.5 3.2* 3.1*  CL 88*  --  94* 92*  93* 94*  CO2 31  --  30 33* 34* 36*  GLUCOSE 164*  --  160* 103* 96 114*  BUN 71*  --  74* 76* 66* 64*  CREATININE 2.41*  --  2.58* 2.57* 1.91* 1.65*  CALCIUM 7.2*  --  6.8* 7.2* 7.2* 7.6*  MG  --  1.4*  --   --   --   --   AST 28  --  23  --  27 35  ALT 15  --  14  --  13 16  ALKPHOS 76  --  65  --  61 77  BILITOT 1.5*  --  1.4*  --  1.9* 2.4*   Cardiac Enzymes No results for input(s): CKMB, TROPONINI, MYOGLOBIN in the last 168 hours.  Invalid input(s): CK ------------------------------------------------------------------------------------------------------------------    Component Value Date/Time   BNP 601.0 (H) 08/10/2019 2336     Barton Dubois M.D on 11/06/2019 at 3:47 PM  Go to [email or web address] - for contact info  [facility] - Office  [phone]

## 2019-11-06 NOTE — Evaluation (Signed)
Physical Therapy Evaluation Patient Details Name: Shaun May MRN: [id number] DOB: 10/17/1951 Today's Date: 11/06/2019   History of Present Illness  Shaun May is a 68 y.o. 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  Chronic venous insufficiency, history of left leg cellulitis, history of cirrhosis with ascites status post paracentesis in the last month.  Patient lives alone with an intermittent visitation by a girlfriend.  According to the patient's son he is irregular in taking his medications as instructed.  He has been failing to thrive.  The patient's son found him today to be more confused than usual and noted that his left leg was very erythematous with serosanguineous drainage.  Son called EMS to bring the patient to the [facility] emergency department for evaluation.    Clinical Impression  Patient limited for functional mobility as stated below secondary to BLE weakness, fatigue and poor standing balance.  Patient limited to a few slow labored side steps at bedside with difficulty taking steps due to weakness and tolerated sitting up in chair with his girl friend present in room after therapy.  Patient will benefit from continued physical therapy in hospital and recommended venue below to increase strength, balance, endurance for safe ADLs and gait.     Follow Up Recommendations SNF;Supervision for mobility/OOB;Supervision - Intermittent    Equipment Recommendations  None recommended by PT    Recommendations for Other Services       Precautions / Restrictions Precautions Precautions: Fall Restrictions Weight Bearing Restrictions: No      Mobility  Bed Mobility Overal bed mobility: Needs Assistance Bed Mobility: Supine to Sit     Supine to sit: Mod assist     General bed mobility comments: slow labored  movement  Transfers Overall transfer level: Needs assistance Equipment used: Rolling walker (2 wheeled) Transfers: Sit to/from [facility] Sit to Stand: Mod assist Stand pivot transfers: Mod assist       General transfer comment: unsteady slow labored movement using RW  Ambulation/Gait Ambulation/Gait assistance: Mod assist;Max assist Gait Distance (Feet): 6 Feet Assistive device: Rolling walker (2 wheeled) Gait Pattern/deviations: Decreased step length - right;Decreased step length - left;Decreased stride length Gait velocity: slow   General Gait Details: limited to 5-6 slow unsteady labored side steps at bedside due to weakness and poor standing balance  Stairs            Wheelchair Mobility    Modified Rankin (Stroke Patients Only)       Balance Overall balance assessment: Needs assistance Sitting-balance support: Feet supported;No upper extremity supported Sitting balance-Leahy Scale: Fair Sitting balance - Comments: seated at EOB   Standing balance support: During functional activity;Bilateral upper extremity supported Standing balance-Leahy Scale: Poor Standing balance comment: fair/poor using RW                             Pertinent Vitals/Pain Pain Assessment: No/denies pain    Home Living Family/patient expects to be discharged to:: Private residence Living Arrangements: Spouse/significant other(girlfriend) Available Help at Discharge: Family;Available 24 hours/day Type of Home: House Home Access: Stairs to enter Entrance Stairs-Rails: None Entrance Stairs-Number of Steps: 1 Home Layout: One level Home Equipment: Walker - 2 wheels;Wheelchair - manual Additional Comments: information per his girl friend    Prior Function Level of Independence: Needs assistance  Gait / Transfers Assistance Needed: household ambulator with RW  ADL's / Homemaking Assistance Needed: assisted by girl friend        Hand Dominance    Dominant Hand: Right    Extremity/Trunk Assessment   Upper Extremity Assessment Upper Extremity Assessment: Generalized weakness    Lower Extremity Assessment Lower Extremity Assessment: Generalized weakness    Cervical / Trunk Assessment Cervical / Trunk Assessment: Normal  Communication   Communication: No difficulties  Cognition Arousal/Alertness: Awake/alert Behavior During Therapy: WFL for tasks assessed/performed Overall Cognitive Status: Within Functional Limits for tasks assessed                                        General Comments      Exercises     Assessment/Plan    PT Assessment Patient needs continued PT services  PT Problem List Decreased strength;Decreased activity tolerance;Decreased balance;Decreased mobility       PT Treatment Interventions Gait training;Stair training;Functional mobility training;Therapeutic activities;Manual techniques;Patient/family education    PT Goals (Current goals can be found in the Care Plan section)  Acute Rehab PT Goals Patient Stated Goal: return home with family to assist PT Goal Formulation: With patient/family Time For Goal Achievement: 11/20/19 Potential to Achieve Goals: Good    Frequency Min 3X/week   Barriers to discharge        Co-evaluation               AM-PAC PT "6 Clicks" Mobility  Outcome Measure Help needed turning from your back to your side while in a flat bed without using bedrails?: A Little Help needed moving from lying on your back to sitting on the side of a flat bed without using bedrails?: A Lot Help needed moving to and from a bed to a chair (including a wheelchair)?: A Lot Help needed standing up from a chair using your arms (e.g., wheelchair or bedside chair)?: A Lot Help needed to walk in hospital room?: A Lot Help needed climbing 3-5 steps with a railing? : Total 6 Click Score: 12    End of Session Equipment Utilized During Treatment: Gait  belt;Oxygen Activity Tolerance: Patient limited by fatigue;Patient tolerated treatment well Patient left: in chair;with call bell/phone within reach;with chair alarm set;with family/visitor present Nurse Communication: Mobility status PT Visit Diagnosis: Unsteadiness on feet (R26.81);Other abnormalities of gait and mobility (R26.89);Muscle weakness (generalized) (M62.81)    Time: 1610-9604 PT Time Calculation (min) (ACUTE ONLY): 23 min   Charges:   PT Evaluation $PT Eval Moderate Complexity: 1 Mod PT Treatments $Therapeutic Activity: 23-37 mins        3:37 PM, 11/06/19 Lonell Grandchild, MPT Physical Therapist with [facility] [phone] office [phone] mobile phone

## 2019-11-07 ENCOUNTER — Ambulatory Visit (INDEPENDENT_AMBULATORY_CARE_PROVIDER_SITE_OTHER): Payer: Medicare Other | Admitting: Nurse Practitioner

## 2019-11-07 LAB — CBC WITH DIFFERENTIAL/PLATELET
Abs Immature Granulocytes: 0.36 10*3/uL — ABNORMAL HIGH (ref 0.00–0.07)
Basophils Absolute: 0 10*3/uL (ref 0.0–0.1)
Basophils Relative: 0 %
Eosinophils Absolute: 0 10*3/uL (ref 0.0–0.5)
Eosinophils Relative: 0 %
HCT: 33 % — ABNORMAL LOW (ref 39.0–52.0)
Hemoglobin: 9.9 g/dL — ABNORMAL LOW (ref 13.0–17.0)
Immature Granulocytes: 2 %
Lymphocytes Relative: 1 %
Lymphs Abs: 0.1 10*3/uL — ABNORMAL LOW (ref 0.7–4.0)
MCH: 29.4 pg (ref 26.0–34.0)
MCHC: 30 g/dL (ref 30.0–36.0)
MCV: 97.9 fL (ref 80.0–100.0)
Monocytes Absolute: 0.8 10*3/uL (ref 0.1–1.0)
Monocytes Relative: 4 %
Neutro Abs: 21 10*3/uL — ABNORMAL HIGH (ref 1.7–7.7)
Neutrophils Relative %: 93 %
Platelets: 138 10*3/uL — ABNORMAL LOW (ref 150–400)
RBC: 3.37 MIL/uL — ABNORMAL LOW (ref 4.22–5.81)
RDW: 16.6 % — ABNORMAL HIGH (ref 11.5–15.5)
WBC: 22.3 10*3/uL — ABNORMAL HIGH (ref 4.0–10.5)
nRBC: 0.1 % (ref 0.0–0.2)

## 2019-11-07 LAB — CULTURE, BLOOD (ROUTINE X 2)
Culture: NO GROWTH
Culture: NO GROWTH
Special Requests: ADEQUATE
Special Requests: ADEQUATE

## 2019-11-07 MED ORDER — GUAIFENESIN 100 MG/5ML PO SOLN: 15.0000 mL | Freq: Three times a day (TID) | ORAL | 0 refills | Status: AC

## 2019-11-07 MED ORDER — PANTOPRAZOLE SODIUM 40 MG PO TBEC: 40.0000 mg | DELAYED_RELEASE_TABLET | Freq: Two times a day (BID) | ORAL | 0 refills | Status: DC

## 2019-11-07 MED ORDER — PREDNISONE 20 MG PO TABS: 40.0000 mg | ORAL_TABLET | Freq: Every day | ORAL | 0 refills | Status: AC

## 2019-11-07 MED ORDER — LACTULOSE 10 GM/15ML PO SOLN: 20.0000 g | Freq: Two times a day (BID) | ORAL | 0 refills | Status: AC

## 2019-11-07 MED ORDER — SPIRONOLACTONE 50 MG PO TABS: 50.0000 mg | ORAL_TABLET | Freq: Every day | ORAL | 0 refills | Status: AC

## 2019-11-07 MED ORDER — CHLORDIAZEPOXIDE HCL 5 MG PO CAPS: 15.0000 mg | ORAL_CAPSULE | Freq: Three times a day (TID) | ORAL | 0 refills | Status: AC

## 2019-11-07 MED ORDER — FUROSEMIDE 20 MG PO TABS: 20.0000 mg | ORAL_TABLET | Freq: Every day | ORAL | 0 refills | Status: AC

## 2019-11-07 MED ORDER — ALUM & MAG HYDROXIDE-SIMETH 200-200-20 MG/5ML PO SUSP: 30.0000 mL | ORAL | 0 refills | Status: AC | PRN

## 2019-11-07 MED ORDER — IPRATROPIUM-ALBUTEROL 0.5-2.5 (3) MG/3ML IN SOLN: 3.0000 mL | Freq: Three times a day (TID) | RESPIRATORY_TRACT | 0 refills | Status: AC

## 2019-11-07 NOTE — TOC Transition Note (Addendum)
Transition of Care [facility]) - CM/SW Discharge Note   Patient Details  Name: Shaun May MRN: [id number] Date of Birth: 03/06/1951  [facility] CM/SW Contact:  Amiee Wiley, Chauncey Reading, RN Phone Number: 11/07/2019, 12:06 PM   Clinical Narrative:   Patient ready to DC to [facility]. Clinicals sent. Family updated. Bedside RN to call report. EMS arranged for 3pm.    Final next level of care: [place] Barriers to Discharge: Barriers Resolved   Patient Goals and CMS Choice Patient states their goals for this hospitalization and ongoing recovery are:: to go to SNF then return home. CMS Medicare.gov Compare Post Acute Care list provided to:: Patient Choice offered to / list presented to : Patient  Discharge Placement              Patient chooses bed at: Other - please specify in the comment section below:([facility]) Patient to be transferred to facility by: [place] Name of family member notified: KAREN AND SON- Patient and family notified of of transfer: 11/07/19           Social Determinants of Health ([place]) Interventions     Readmission Risk Interventions Readmission Risk Prevention Plan 11/07/2019 10/31/2019  Transportation Screening Complete Complete  PCP or Specialist Appt within 3-5 Days Complete Not Complete  HRI or [place] Not Complete Complete  HRI or Home Care Consult comments going to SNF -  Social Work Consult for [place] Planning/Counseling Complete Complete  Palliative Care Screening Not Applicable Not Complete  Medication Review [employer]) Complete Complete  Some recent data might be hidden

## 2019-11-07 NOTE — Care Management Important Message (Signed)
Important Message  Patient Details  Name: Shaun May MRN: [id number] Date of Birth: 05/20/1951   Medicare Important Message Given:  Yes     Tommy Medal 11/07/2019, 4:18 PM

## 2019-11-07 NOTE — Discharge Summary (Signed)
Physician Discharge Summary  Shaun May DXI:[id number] DOB: Jun 23, 1951 DOA: 10/30/2019  PCP: Celene Squibb, MD  Admit date: 10/30/2019  Discharge date: 11/07/2019  Admitted From:Home  Disposition:  SNF  Recommendations for Outpatient Follow-up:  1. Follow up with PCP in 1-2 weeks 2. Continue on prednisone for 5 more days as scheduled 3. Repeat BMP in 1 week  Home Health:None  Equipment/Devices:[place] oxygen  Discharge Condition:Stable  CODE STATUS: Full  Diet recommendation: Heart Healthy  Brief/Interim Summary: 68 y.o.malewith medical history significant ofCOPD Gold stage IV oxygen dependent, paroxysmal atrial fibrillation, diastolic heart failure chronic, history of complete heart block with a pacemaker in place. Chronic venous insufficiency, history of left leg cellulitis, history of cirrhosis with ascites status post paracentesis in the last month. Patient lives alone with an intermittent visitation by a girlfriend. According to the patient's son he is irregular in taking his medications as instructed -Lethargy and respiratory distress on 11/01/2019 transferred to ICU on BiPAP  12/2: Patient is stable for transfer today and is currently on Librium without any further DTs.  He has completed his IV antibiotics as well.  He will need further rehabilitation at SNF as ordered.  No other acute events noted throughout the course of this admission.  Please see below for full details.   1) sepsis secondary to Lt Leg Cellulitis/Wound infection --- open wounds on left lower extremity- with secondary infection-pneumonia may be contributing to patient's septic picture as well -Sepsis pathophysiology exhibited by hypothermia, altered mentation, hypotension and worsening renal perfusion, leukocytosis appears to be resolving -MRSA PCR Neg -10/30/2019 NGTD -WBC is down to 10.9 from 11.7, but patient is also on steroids -Given hypothermia blood cultures repeated on 11/02/2019, but have  shown no growth -Patient finished course of IV antibiotics with Merrem today  2)Presumed pneumonia- may be aspiration related antibiotics as above #1,  -He has been weaned off of BiPAP and is back on nasal cannula -Completed course of IV antibiotics  3) alcohol withdrawal with delirium tremens-resolved.   -Patient placed on Precedex drip with improvement of agitation. -Weaned off precedex on 11/04/19 -started on librium now; planning for slow tapering..  -cessation counseling provided. -continue thiamine and folic acid.   4) generalized weakness and deconditioning--recurrent falls at home -Physical therapy evaluated patient previously recommended SNF rehab -follow improvement and placement.   5) COPD and tobacco abuse--continues to smoke despite being on home O2 -continue bronchodilators and steroids tapering with oral prednisone for 5 more days -Breathing much better; even if still short of breath on exertion.  6)Acute on chronic respiratory failure with hypoxia and hypercapnia --- secondary to #5 above, PTA patient used 2 to 3 L of oxygen at home continue oxygen supplementation -Acute on chronic respiratory failure secondary to presumed aspiration event on 10/31/2019 required BiPAP; Chest x-ray on 11/01/2019 and 11/02/2019 Noted -CHF versus pneumonia  -Clinically suspect aspiration pneumonia Antibiotics completed -Patient weaned off BiPAP and will continue to-3 L nasal cannula  7)alcoholic liver cirrhosis--requiring recurrent paracenteses -Unable to give torsemide due to respiratory distress requiring continuous BiPAP as well as soft BP -Continue lactulose adjusted dose -Patient mentation close to baseline. -Patient will be started on Lasix and Aldactone -Status post paracentesis on 11/05/2019 (2.3 L removed).  8)HFpEF--- last known EF 60 to 65%,  -patient with stable chronic diastolic CHF/nonischemic cardiomyopathy, appears compensated at this time.   -Follow daily  weights and strict I's and O's.  8)Social/ethics--patient technically lives alone , but his girlfriend has been technically living  with him lately due to weakness and recurrent falls -Patient and girlfriend drink alcohol together--- patient is a full code  -Patient is a full code as per family  9) history of complete heart block--- status post permanent pacemaker placement since 2016, patient follows with Dr. Lovena Le  10)Persistent atrial fibrillation/flutter--- unable to give Coreg due to soft BP, not a candidate for anticoagulation due to recurrent GI bleed and hematuria in an alcoholic male with recurrent falls  11) acute metabolic encephalopathy--multifactorial suspect some component of DTs, also some component of hepatic encephalopathy with elevated ammonia, and CO2 narcosis/hypercapnia in the setting of acute on chronic hypoxic and hypercapnic respiratory failure -Treat underlying etiologies and metabolic encephalopathy should improve -Overall mental status is improving  12)AKI----acute kidney injury on CKD stage IIIb-      -creatinine on admission=1.95  ,   baseline creatinine =1.1 in 08/2019, creatinine is now=1.67 on last check.  Follow-up BMP in 1 week -Continue to follow adequate hydration and avoid hypotension. -Good urine output reported.  13) thrombocytopenia.  Possibly related to alcohol use.   -Continue to follow.   -No signs of bleeding at this time.  14-hypokalemia-resolved -Replete electrolytes and follow electrolytes trend with repeat BMP in 1 week -Started on spironolactone.  Discharge Diagnoses:  Principal Problem:   Sepsis ([place]) Active Problems:   Hypertension   COPD GOLD IV/ 02 dep at hs/ still smoking    Atrial flutter ([place]), paroxysmal   Cirrhosis of liver with ascites ([place])   Cellulitis of left leg/Wound Infection   ETOH abuse   Acute on chronic respiratory failure with hypoxia (H[facility])   (HFpEF) heart failure with preserved ejection fraction/EF  08-65%/ Chronic Diastolic Dysfunction CHF   Thrombocytopenia ([facility])   Pressure injury of skin    Discharge Instructions   Allergies as of 11/07/2019      Reactions   Xarelto [rivaroxaban] Other (See Comments)   Caused bleeding.    Eliquis [apixaban]    Internal bleeding      Medication List    STOP taking these medications   carvedilol 12.5 MG tablet Commonly known as: COREG   ibuprofen 200 MG tablet Commonly known as: ADVIL   loperamide 2 MG capsule Commonly known as: IMODIUM   LORazepam 2 MG tablet Commonly known as: ATIVAN   Magnesium 250 MG Tabs   Mucinex DM Maximum Strength 60-1200 MG Tb12   oxyCODONE-acetaminophen 5-325 MG tablet Commonly known as: PERCOCET/ROXICET   rOPINIRole 0.5 MG tablet Commonly known as: REQUIP   sulfamethoxazole-trimethoprim 800-160 MG tablet Commonly known as: BACTRIM DS   torsemide 20 MG tablet Commonly known as: DEMADEX     TAKE these medications   albuterol 108 (90 Base) MCG/ACT inhaler Commonly known as: VENTOLIN HFA Inhale 1-2 puffs into the lungs every 6 (six) hours as needed for wheezing or shortness of breath.   alum & mag hydroxide-simeth 200-200-20 MG/5ML suspension Commonly known as: MAALOX/MYLANTA Take 30 mLs by mouth every 4 (four) hours as needed for indigestion or heartburn.   atorvastatin 40 MG tablet Commonly known as: LIPITOR Take 40 mg by mouth every evening.   chlordiazePOXIDE 5 MG capsule Commonly known as: LIBRIUM Take 3 capsules (15 mg total) by mouth 3 (three) times daily.   Fluticasone-Umeclidin-Vilant 100-62.5-25 MCG/INH Aepb Commonly known as: Trelegy Ellipta Inhale 1 puff into the lungs daily.   furosemide 20 MG tablet Commonly known as: LASIX Take 1 tablet (20 mg total) by mouth daily.   guaiFENesin 100 MG/5ML Soln Commonly known  as: ROBITUSSIN Take 15 mLs (300 mg total) by mouth 3 (three) times daily.   ipratropium-albuterol 0.5-2.5 (3) MG/3ML Soln Commonly known as: DUONEB Take  3 mLs by nebulization 3 (three) times daily for 5 days.   lactulose 10 GM/15ML solution Commonly known as: CHRONULAC Take 30 mLs (20 g total) by mouth 2 (two) times daily.   OXYGEN Inhale 2 L into the lungs at bedtime. **2L with sleep and exertion if needed   pantoprazole 40 MG tablet Commonly known as: PROTONIX Take 1 tablet (40 mg total) by mouth 2 (two) times daily. What changed: when to take this   predniSONE 20 MG tablet Commonly known as: Deltasone Take 2 tablets (40 mg total) by mouth daily for 5 days.   sodium chloride 0.65 % Soln nasal spray Commonly known as: OCEAN Place 1 spray into both nostrils as needed for congestion.   spironolactone 50 MG tablet Commonly known as: ALDACTONE Take 1 tablet (50 mg total) by mouth daily.       Contact information for follow-up providers    Celene Squibb, MD Follow up in 1 week(s).   Specialty: Internal Medicine Contact information: [place] [address] [phone]            Contact information for after-discharge care    [facility] .   Service: Skilled Nursing Contact information: [place] [phone]                 Allergies  Allergen Reactions  . Xarelto [Rivaroxaban] Other (See Comments)    Caused bleeding.   . Eliquis [Apixaban]     Internal bleeding    Consultations:  None   Procedures/Studies: Dg Chest 2 View  Result Date: 10/30/2019 CLINICAL DATA:  Lower extremity redness, swelling and increased fluid retention, more confusion evening, multiple falls this past weekend, laid on floor for hours, history smoking, pacemaker, atrial fibrillation, COPD, chronic kidney disease, hypertension, CHF, RIGHT upper lobe lung cancer EXAM: CHEST - 2 VIEW COMPARISON:  08/10/2019 Correlation: CT angio chest 05/17/2018 FINDINGS: LEFT subclavian sequential transvenous pacemaker leads project at RIGHT atrium  and RIGHT ventricle. Normal heart size, mediastinal contours, and pulmonary vascularity. Atherosclerotic calcification aorta. Scarring in RIGHT upper lobe with associated stellate opacity, question post radiation therapy change; this has increased since the previous study. Atelectasis versus consolidation LEFT lower lobe. Remaining lungs clear. Small LEFT and tiny RIGHT pleural effusions. No pneumothorax or acute osseous findings. IMPRESSION: Atelectasis versus consolidation LEFT lower lobe with associated LEFT pleural effusion. Increased delayed opacity in the RIGHT upper lobe question progressive post radiation therapy changes. Tiny RIGHT pleural effusion. Aortic Atherosclerosis (ICD10-I70.0). Electronically Signed   By: Lavonia Dana M.D.   On: 10/30/2019 14:58   US Venous Img Lower Unilateral Left (dvt)  Result Date: 10/30/2019 CLINICAL DATA:  68 year old male with left lower extremity redness and swelling for 1 week EXAM: LEFT LOWER EXTREMITY VENOUS DOPPLER ULTRASOUND TECHNIQUE: Gray-scale sonography with graded compression, as well as color Doppler and duplex ultrasound were performed to evaluate the lower extremity deep venous systems from the level of the common femoral vein and including the common femoral, femoral, profunda femoral, popliteal and calf veins including the posterior tibial, peroneal and gastrocnemius veins when visible. The superficial great saphenous vein was also interrogated. Spectral Doppler was utilized to evaluate flow at rest and with distal augmentation maneuvers in the common femoral, femoral and popliteal veins.  COMPARISON:  None. FINDINGS: Contralateral Common Femoral Vein: Respiratory phasicity is normal and symmetric with the symptomatic side. No evidence of thrombus. Normal compressibility. Common Femoral Vein: No evidence of thrombus. Normal compressibility, respiratory phasicity and response to augmentation. Saphenofemoral Junction: No evidence of thrombus. Normal  compressibility and flow on color Doppler imaging. Profunda Femoral Vein: No evidence of thrombus. Normal compressibility and flow on color Doppler imaging. Femoral Vein: No evidence of thrombus. Normal compressibility, respiratory phasicity and response to augmentation. Popliteal Vein: No evidence of thrombus. Normal compressibility, respiratory phasicity and response to augmentation. Calf Veins: No evidence of thrombus. Normal compressibility and flow on color Doppler imaging. Superficial Great Saphenous Vein: No evidence of thrombus. Normal compressibility. Venous Reflux:  None. Other Findings:  None. IMPRESSION: No evidence of deep venous thrombosis. Electronically Signed   By: Jacqulynn Cadet M.D.   On: 10/30/2019 14:45   US Paracentesis  Result Date: 11/05/2019 INDICATION: Cirrhosis of liver with ascites EXAM: ULTRASOUND GUIDED THERAPEUTIC PARACENTESIS MEDICATIONS: None COMPLICATIONS: None immediate PROCEDURE: Informed written consent was obtained from the patient after a discussion of the risks, benefits and alternatives to treatment. A timeout was performed prior to the initiation of the procedure. Initial ultrasound scanning demonstrates a large amount of ascites within the right lower abdominal quadrant. The right lower abdomen was prepped and draped in the usual sterile fashion. 1% lidocaine was used for local anesthesia. Following this, a 5 [place] Yueh catheter was introduced. An ultrasound image was saved for documentation purposes. The paracentesis was performed. The catheter was removed and a dressing was applied. The patient tolerated the procedure well without immediate post procedural complication. Patient received post-procedure intravenous albumin; see nursing notes for details. FINDINGS: A total of approximately 2.3 L of amber colored ascitic fluid was removed. IMPRESSION: Successful ultrasound-guided paracentesis yielding 2.3 liters of peritoneal fluid. Electronically Signed   By: Lavonia Dana M.D.   On: 11/05/2019 17:21   US Paracentesis  Result Date: 10/24/2019 INDICATION: Cirrhosis, ascites EXAM: ULTRASOUND GUIDED DIAGNOSTIC AND THERAPEUTIC PARACENTESIS MEDICATIONS: NONE COMPLICATIONS: NONE IMMEDIATE PROCEDURE: Informed written consent was obtained from the patient after a discussion of the risks, benefits and alternatives to treatment. A timeout was performed prior to the initiation of the procedure. Initial ultrasound scanning demonstrates a moderate amount of ascites within the LEFT lower abdominal quadrant. The right lower abdomen was prepped and draped in the usual sterile fashion. 1% lidocaine was used for local anesthesia. Following this, a 5 [place] Yueh catheter was introduced. An ultrasound image was saved for documentation purposes. The paracentesis was performed. The catheter was removed and a dressing was applied. The patient tolerated the procedure well without immediate post procedural complication. Patient received post-procedure intravenous albumin; see nursing notes for details. FINDINGS: A total of approximately 2.9 L of amber colored ascitic fluid was removed. Samples were sent to the laboratory as requested by the clinical team. IMPRESSION: Successful ultrasound-guided paracentesis yielding 2.9 liters of peritoneal fluid. Electronically Signed   By: Lavonia Dana M.D.   On: 10/24/2019 16:07   Dg Chest Port 1 View  Result Date: 11/02/2019 CLINICAL DATA:  Respiratory failure EXAM: PORTABLE CHEST 1 VIEW COMPARISON:  Radiograph 11/01/2019 FINDINGS: Cardiac pacer pack overlies the left chest wall. Pacer lead positioning is unchanged from prior. Surgical clips in the right upper lobe and a spiculated mass lesion in the right upper lung are similar prior accounting for differences in positioning. There is slightly increasing bilateral airspace disease most pronounced to the infrahilar lungs. Cardiomediastinal  contours including a calcified aorta are stable from prior. No pre  or paravertebral fluid or swelling. No visible canal hematoma. IMPRESSION: 1. Slightly increasing bilateral airspace disease most pronounced in the infrahilar lungs. Possibly worsening edema or infection. 2. Stable postoperative changes and spiculated mass in the right upper lobe. 3. Prior CABG. 4.  Aortic Atherosclerosis (ICD10-I70.0). Electronically Signed   By: Lovena Le M.D.   On: 11/02/2019 07:02   Dg Chest Port 1 View  Result Date: 11/01/2019 CLINICAL DATA:  Hypoxia. EXAM: PORTABLE CHEST 1 VIEW COMPARISON:  10/30/2019 FINDINGS: Dual lead pacemaker unchanged. Heart size upper normal. Vascular congestion has progressed. Progressive left lower lobe airspace disease and small left effusion. No effusion on the right. Surgical clips in the right upper lobe with spiculated mass lesion unchanged from prior study. IMPRESSION: Progression of vascular congestion. Progressive left lower lobe airspace disease and left effusion. Findings suggestive of fluid overload however pneumonia in the left lower lobe is possible. Electronically Signed   By: Franchot Gallo M.D.   On: 11/01/2019 06:50     Discharge Exam: Vitals:   11/07/19 0859 11/07/19 0908  BP:    Pulse:    Resp:    Temp:    SpO2: 90% 98%   Vitals:   11/06/19 2157 11/07/19 0449 11/07/19 0859 11/07/19 0908  BP:  124/74    Pulse: 62     Resp: 20     Temp:  (!) 97.4 F (36.3 C)    TempSrc:  Oral    SpO2: 98% 92% 90% 98%  Weight:      Height:        General: Pt is alert, awake, not in acute distress Cardiovascular: RRR, S1/S2 +, no rubs, no gallops Respiratory: CTA bilaterally, no wheezing, no rhonchi, on [place] oxygen, currently on nasal cannula oxygen Abdominal: Soft, NT, ND, bowel sounds + Extremities: no edema, no cyanosis    The results of significant diagnostics from this hospitalization (including imaging, microbiology, ancillary and laboratory) are listed below for reference.     Microbiology: Recent Results (from the past  240 hour(s))  Culture, blood (single)     Status: None   Collection Time: 10/30/19  7:08 PM   Specimen: Right Antecubital; Blood  Result Value Ref Range Status   Specimen Description RIGHT ANTECUBITAL  Final   Special Requests   Final    BOTTLES DRAWN AEROBIC AND ANAEROBIC Blood Culture adequate volume   Culture   Final    NO GROWTH 6 DAYS Performed at [facility], [address], [place], [place] [address]    Report Status 11/05/2019 FINAL  Final  SARS CORONAVIRUS 2 (TAT 6-24 HRS) Nasopharyngeal Nasopharyngeal Swab     Status: None   Collection Time: 10/31/19  7:01 AM   Specimen: Nasopharyngeal Swab  Result Value Ref Range Status   SARS Coronavirus 2 NEGATIVE NEGATIVE Final    Comment: (NOTE) SARS-CoV-2 target nucleic acids are NOT DETECTED. The SARS-CoV-2 RNA is generally detectable in upper and lower respiratory specimens during the acute phase of infection. Negative results do not preclude SARS-CoV-2 infection, do not rule out co-infections with other pathogens, and should not be used as the sole basis for treatment or other patient management decisions. Negative results must be combined with clinical observations, patient history, and epidemiological information. The expected result is Negative. Fact Sheet for Patients: SugarRoll.be Fact Sheet for Healthcare Providers: https://www.woods-mathews.com/ This test is not yet approved or cleared by the [place] FDA and  has  been authorized for detection and/or diagnosis of SARS-CoV-2 by FDA under an Emergency Use Authorization (EUA). This EUA will remain  in effect (meaning this test can be used) for the duration of the COVID-19 declaration under Section 56 4(b)(1) of the Act, 21 U.S.C. section 360bbb-3(b)(1), unless the authorization is terminated or revoked sooner. Performed at [facility], [place] [address], [place], [place] [address]   MRSA PCR Screening     Status: None    Collection Time: 10/31/19  5:59 PM   Specimen: Nasal Mucosa; Nasopharyngeal  Result Value Ref Range Status   MRSA by PCR NEGATIVE NEGATIVE Final    Comment:        The GeneXpert MRSA Assay (FDA approved for NASAL specimens only), is one component of a comprehensive MRSA colonization surveillance program. It is not intended to diagnose MRSA infection nor to guide or monitor treatment for MRSA infections. Performed at [facility], [address] [place], [place] [address]   Culture, blood (Routine X 2) w Reflex to ID Panel     Status: None   Collection Time: 11/02/19  9:16 AM   Specimen: BLOOD LEFT HAND  Result Value Ref Range Status   Specimen Description   Final    BLOOD LEFT HAND BOTTLES DRAWN AEROBIC AND ANAEROBIC   Special Requests Blood Culture adequate volume  Final   Culture   Final    NO GROWTH 5 DAYS Performed at [facility], [address] [place], [place] [address]    Report Status 11/07/2019 FINAL  Final  Culture, blood (Routine X 2) w Reflex to ID Panel     Status: None   Collection Time: 11/02/19 11:09 AM   Specimen: BLOOD  Result Value Ref Range Status   Specimen Description BLOOD BLOOD LEFT WRIST  Final   Special Requests   Final    BOTTLES DRAWN AEROBIC AND ANAEROBIC Blood Culture adequate volume   Culture   Final    NO GROWTH 5 DAYS Performed at [facility], [address] [place],  [address]    Report Status 11/07/2019 FINAL  Final     Labs: BNP (last 3 results) Recent Labs    08/10/19 2336  BNP 160.1*   Basic Metabolic Panel: Recent Labs  Lab 11/01/19 0449 11/01/19 0640 11/02/19 0626 11/03/19 0412 11/04/19 0448 11/05/19 0650  NA 133*  --  134* 138 138 141  K 3.6  --  3.9 3.5 3.2* 3.1*  CL 88*  --  94* 92* 93* 94*  CO2 31  --  30 33* 34* 36*  GLUCOSE 164*  --  160* 103* 96 114*  BUN 71*  --  74* 76* 66* 64*  CREATININE 2.41*  --  2.58* 2.57* 1.91* 1.65*  CALCIUM 7.2*  --  6.8* 7.2* 7.2* 7.6*  MG  --  1.4*  --   --   --    --   PHOS  --  5.6*  --   --   --   --    Liver Function Tests: Recent Labs  Lab 11/01/19 0449 11/02/19 0626 11/04/19 0448 11/05/19 0650  AST 28 23 27  35  ALT 15 14 13 16   ALKPHOS 76 65 61 77  BILITOT 1.5* 1.4* 1.9* 2.4*  PROT 6.7 6.0* 5.8* 6.5  ALBUMIN 3.0* 2.7* 2.6* 2.8*   No results for input(s): LIPASE, AMYLASE in the last 168 hours. Recent Labs  Lab 11/01/19 0640 11/04/19 0448 11/05/19 0650  AMMONIA 67* 66* 16  CBC: Recent Labs  Lab 11/01/19 0640 11/02/19 0626 11/03/19 0412 11/04/19 0448 11/06/19 0550 11/07/19 0516  WBC 11.7* 4.2 10.9* 20.7* 18.4* 22.3*  NEUTROABS 10.9*  --   --   --  17.6* 21.0*  HGB 11.0* 10.2* 10.7* 11.2* 9.7* 9.9*  HCT 38.2* 34.3* 34.9* 36.6* 32.1* 33.0*  MCV 101.1* 99.4 96.7 95.6 97.9 97.9  PLT 141* 83* 89* 103* 119* 138*   Cardiac Enzymes: No results for input(s): CKTOTAL, CKMB, CKMBINDEX, TROPONINI in the last 168 hours. BNP: Invalid input(s): POCBNP CBG: Recent Labs  Lab 11/06/19 0007 11/06/19 0604 11/06/19 0719 11/06/19 1053 11/06/19 1626  GLUCAP 116* 109* 122* 139* 127*   D-Dimer No results for input(s): DDIMER in the last 72 hours. Hgb A1c No results for input(s): HGBA1C in the last 72 hours. Lipid Profile No results for input(s): CHOL, HDL, LDLCALC, TRIG, CHOLHDL, LDLDIRECT in the last 72 hours. Thyroid function studies No results for input(s): TSH, T4TOTAL, T3FREE, THYROIDAB in the last 72 hours.  Invalid input(s): FREET3 Anemia work up No results for input(s): VITAMINB12, FOLATE, FERRITIN, TIBC, IRON, RETICCTPCT in the last 72 hours. Urinalysis    Component Value Date/Time   COLORURINE YELLOW 05/25/2016 2318   APPEARANCEUR CLEAR 05/25/2016 2318   LABSPEC <1.005 (L) 05/25/2016 2318   PHURINE 5.5 05/25/2016 2318   GLUCOSEU NEGATIVE 05/25/2016 2318   HGBUR NEGATIVE 05/25/2016 2318   [place] NEGATIVE 05/25/2016 2318   KETONESUR NEGATIVE 05/25/2016 2318   PROTEINUR NEGATIVE 05/25/2016 2318   NITRITE  NEGATIVE 05/25/2016 2318   LEUKOCYTESUR NEGATIVE 05/25/2016 2318   Sepsis Labs Invalid input(s): PROCALCITONIN,  WBC,  LACTICIDVEN Microbiology Recent Results (from the past 240 hour(s))  Culture, blood (single)     Status: None   Collection Time: 10/30/19  7:08 PM   Specimen: Right Antecubital; Blood  Result Value Ref Range Status   Specimen Description RIGHT ANTECUBITAL  Final   Special Requests   Final    BOTTLES DRAWN AEROBIC AND ANAEROBIC Blood Culture adequate volume   Culture   Final    NO GROWTH 6 DAYS Performed at [facility], [address], [place], [place] [address]    Report Status 11/05/2019 FINAL  Final  SARS CORONAVIRUS 2 (TAT 6-24 HRS) Nasopharyngeal Nasopharyngeal Swab     Status: None   Collection Time: 10/31/19  7:01 AM   Specimen: Nasopharyngeal Swab  Result Value Ref Range Status   SARS Coronavirus 2 NEGATIVE NEGATIVE Final    Comment: (NOTE) SARS-CoV-2 target nucleic acids are NOT DETECTED. The SARS-CoV-2 RNA is generally detectable in upper and lower respiratory specimens during the acute phase of infection. Negative results do not preclude SARS-CoV-2 infection, do not rule out co-infections with other pathogens, and should not be used as the sole basis for treatment or other patient management decisions. Negative results must be combined with clinical observations, patient history, and epidemiological information. The expected result is Negative. Fact Sheet for Patients: SugarRoll.be Fact Sheet for Healthcare Providers: https://www.woods-mathews.com/ This test is not yet approved or cleared by the [place] FDA and  has been authorized for detection and/or diagnosis of SARS-CoV-2 by FDA under an Emergency Use Authorization (EUA). This EUA will remain  in effect (meaning this test can be used) for the duration of the COVID-19 declaration under Section 56 4(b)(1) of the Act, 21 U.S.C. section  360bbb-3(b)(1), unless the authorization is terminated or revoked sooner. Performed at [facility], [place] [address] [place], [place] [address]   MRSA PCR  Screening     Status: None   Collection Time: 10/31/19  5:59 PM   Specimen: Nasal Mucosa; Nasopharyngeal  Result Value Ref Range Status   MRSA by PCR NEGATIVE NEGATIVE Final    Comment:        The GeneXpert MRSA Assay (FDA approved for NASAL specimens only), is one component of a comprehensive MRSA colonization surveillance program. It is not intended to diagnose MRSA infection nor to guide or monitor treatment for MRSA infections. Performed at [facility], [address], [place], [place] [address]   Culture, blood (Routine X 2) w Reflex to ID Panel     Status: None   Collection Time: 11/02/19  9:16 AM   Specimen: BLOOD LEFT HAND  Result Value Ref Range Status   Specimen Description   Final    BLOOD LEFT HAND BOTTLES DRAWN AEROBIC AND ANAEROBIC   Special Requests Blood Culture adequate volume  Final   Culture   Final    NO GROWTH 5 DAYS Performed at [facility], [address] [place], [place] [address]    Report Status 11/07/2019 FINAL  Final  Culture, blood (Routine X 2) w Reflex to ID Panel     Status: None   Collection Time: 11/02/19 11:09 AM   Specimen: BLOOD  Result Value Ref Range Status   Specimen Description BLOOD BLOOD LEFT WRIST  Final   Special Requests   Final    BOTTLES DRAWN AEROBIC AND ANAEROBIC Blood Culture adequate volume   Culture   Final    NO GROWTH 5 DAYS Performed at [facility], [address], [place], [place] [address]    Report Status 11/07/2019 FINAL  Final     Time coordinating discharge: 40 minutes  SIGNED:   Rodena Goldmann, DO [facility] 11/07/2019, 10:30 AM  If 7PM-7AM, please contact night-coverage [email or web address]

## 2019-11-07 NOTE — Progress Notes (Signed)
IV removed, 2x2 gauze and paper tape applied to site, patient tolerated well. Report called to Magdalene Molly, LPN at [facility] and all questions answered.  Patient girlfriend, Rayetta Humphrey, aware of patient being discharged. Patient awaiting [facility] to transport to [place].

## 2019-11-08 ENCOUNTER — Other Ambulatory Visit: Payer: Self-pay | Admitting: *Deleted

## 2019-11-08 NOTE — Patient Outreach (Signed)
[facility]) Care Management  11/08/2019  BORIS ENGELMANN August 28, 1951 [id number]   RN Health Coach Case Closure  Referral Date:  06/03/2019 Referral Source:  EMMI Alert Screening Reason for Referral:  Disease Management Education Insurance:  Medicare   Outreach Attempt:  Patient discharged from [facility] to [facility] on 11/07/2019 for rehabilitation.  Plan:  RN Health Coach will send primary care Case Closure Letter.  RN Health Coach will close case as patient is now at [facility] and make patient inactive with [facility].  RN Health Coach will send patient Case Closure Letter.  [place] [phone] [email or web address]

## 2019-11-13 ENCOUNTER — Inpatient Hospital Stay: Payer: Self-pay

## 2019-11-13 ENCOUNTER — Encounter (HOSPITAL_COMMUNITY): Payer: Self-pay | Admitting: *Deleted

## 2019-11-13 ENCOUNTER — Inpatient Hospital Stay (HOSPITAL_COMMUNITY)
Admission: EM | Admit: 2019-11-13 | Discharge: 2019-12-07 | DRG: 291 | Disposition: E | Payer: Medicare Other | Attending: Family Medicine | Admitting: Family Medicine

## 2019-11-13 ENCOUNTER — Inpatient Hospital Stay (HOSPITAL_COMMUNITY): Payer: Medicare Other

## 2019-11-13 ENCOUNTER — Emergency Department (HOSPITAL_COMMUNITY): Payer: Medicare Other

## 2019-11-13 ENCOUNTER — Other Ambulatory Visit: Payer: Self-pay

## 2019-11-13 DIAGNOSIS — J9601 Acute respiratory failure with hypoxia: Secondary | ICD-10-CM

## 2019-11-13 DIAGNOSIS — K7031 Alcoholic cirrhosis of liver with ascites: Secondary | ICD-10-CM | POA: Diagnosis present

## 2019-11-13 DIAGNOSIS — D649 Anemia, unspecified: Secondary | ICD-10-CM | POA: Diagnosis present

## 2019-11-13 DIAGNOSIS — I2510 Atherosclerotic heart disease of native coronary artery without angina pectoris: Secondary | ICD-10-CM | POA: Diagnosis present

## 2019-11-13 DIAGNOSIS — Z20828 Contact with and (suspected) exposure to other viral communicable diseases: Secondary | ICD-10-CM | POA: Diagnosis present

## 2019-11-13 DIAGNOSIS — G9340 Encephalopathy, unspecified: Secondary | ICD-10-CM | POA: Diagnosis not present

## 2019-11-13 DIAGNOSIS — D631 Anemia in chronic kidney disease: Secondary | ICD-10-CM | POA: Diagnosis present

## 2019-11-13 DIAGNOSIS — J9621 Acute and chronic respiratory failure with hypoxia: Secondary | ICD-10-CM | POA: Diagnosis present

## 2019-11-13 DIAGNOSIS — Z8711 Personal history of peptic ulcer disease: Secondary | ICD-10-CM

## 2019-11-13 DIAGNOSIS — K117 Disturbances of salivary secretion: Secondary | ICD-10-CM | POA: Diagnosis not present

## 2019-11-13 DIAGNOSIS — N1832 Chronic kidney disease, stage 3b: Secondary | ICD-10-CM | POA: Diagnosis present

## 2019-11-13 DIAGNOSIS — I441 Atrioventricular block, second degree: Secondary | ICD-10-CM | POA: Diagnosis present

## 2019-11-13 DIAGNOSIS — Z66 Do not resuscitate: Secondary | ICD-10-CM | POA: Diagnosis present

## 2019-11-13 DIAGNOSIS — I272 Pulmonary hypertension, unspecified: Secondary | ICD-10-CM | POA: Diagnosis present

## 2019-11-13 DIAGNOSIS — L89012 Pressure ulcer of right elbow, stage 2: Secondary | ICD-10-CM | POA: Diagnosis present

## 2019-11-13 DIAGNOSIS — I959 Hypotension, unspecified: Secondary | ICD-10-CM | POA: Diagnosis present

## 2019-11-13 DIAGNOSIS — I509 Heart failure, unspecified: Secondary | ICD-10-CM

## 2019-11-13 DIAGNOSIS — I130 Hypertensive heart and chronic kidney disease with heart failure and stage 1 through stage 4 chronic kidney disease, or unspecified chronic kidney disease: Secondary | ICD-10-CM | POA: Diagnosis present

## 2019-11-13 DIAGNOSIS — Z79899 Other long term (current) drug therapy: Secondary | ICD-10-CM

## 2019-11-13 DIAGNOSIS — R911 Solitary pulmonary nodule: Secondary | ICD-10-CM | POA: Diagnosis present

## 2019-11-13 DIAGNOSIS — J449 Chronic obstructive pulmonary disease, unspecified: Secondary | ICD-10-CM | POA: Diagnosis present

## 2019-11-13 DIAGNOSIS — D696 Thrombocytopenia, unspecified: Secondary | ICD-10-CM | POA: Diagnosis present

## 2019-11-13 DIAGNOSIS — G2581 Restless legs syndrome: Secondary | ICD-10-CM | POA: Diagnosis present

## 2019-11-13 DIAGNOSIS — K7682 Hepatic encephalopathy: Secondary | ICD-10-CM

## 2019-11-13 DIAGNOSIS — I442 Atrioventricular block, complete: Secondary | ICD-10-CM | POA: Diagnosis present

## 2019-11-13 DIAGNOSIS — F1010 Alcohol abuse, uncomplicated: Secondary | ICD-10-CM | POA: Diagnosis present

## 2019-11-13 DIAGNOSIS — Z515 Encounter for palliative care: Secondary | ICD-10-CM | POA: Diagnosis not present

## 2019-11-13 DIAGNOSIS — L8990 Pressure ulcer of unspecified site, unspecified stage: Secondary | ICD-10-CM | POA: Diagnosis present

## 2019-11-13 DIAGNOSIS — N179 Acute kidney failure, unspecified: Secondary | ICD-10-CM | POA: Diagnosis present

## 2019-11-13 DIAGNOSIS — Z7951 Long term (current) use of inhaled steroids: Secondary | ICD-10-CM

## 2019-11-13 DIAGNOSIS — E871 Hypo-osmolality and hyponatremia: Secondary | ICD-10-CM | POA: Diagnosis present

## 2019-11-13 DIAGNOSIS — I872 Venous insufficiency (chronic) (peripheral): Secondary | ICD-10-CM | POA: Diagnosis present

## 2019-11-13 DIAGNOSIS — I4819 Other persistent atrial fibrillation: Secondary | ICD-10-CM | POA: Diagnosis present

## 2019-11-13 DIAGNOSIS — K7460 Unspecified cirrhosis of liver: Secondary | ICD-10-CM | POA: Diagnosis not present

## 2019-11-13 DIAGNOSIS — I5030 Unspecified diastolic (congestive) heart failure: Secondary | ICD-10-CM | POA: Diagnosis present

## 2019-11-13 DIAGNOSIS — I5033 Acute on chronic diastolic (congestive) heart failure: Secondary | ICD-10-CM

## 2019-11-13 DIAGNOSIS — F17210 Nicotine dependence, cigarettes, uncomplicated: Secondary | ICD-10-CM | POA: Diagnosis present

## 2019-11-13 DIAGNOSIS — Z950 Presence of cardiac pacemaker: Secondary | ICD-10-CM

## 2019-11-13 DIAGNOSIS — Z452 Encounter for adjustment and management of vascular access device: Secondary | ICD-10-CM | POA: Diagnosis not present

## 2019-11-13 DIAGNOSIS — Z888 Allergy status to other drugs, medicaments and biological substances status: Secondary | ICD-10-CM

## 2019-11-13 DIAGNOSIS — K7040 Alcoholic hepatic failure without coma: Secondary | ICD-10-CM | POA: Diagnosis present

## 2019-11-13 DIAGNOSIS — J441 Chronic obstructive pulmonary disease with (acute) exacerbation: Secondary | ICD-10-CM | POA: Diagnosis present

## 2019-11-13 DIAGNOSIS — I5043 Acute on chronic combined systolic (congestive) and diastolic (congestive) heart failure: Secondary | ICD-10-CM | POA: Diagnosis present

## 2019-11-13 DIAGNOSIS — R188 Other ascites: Secondary | ICD-10-CM | POA: Diagnosis not present

## 2019-11-13 DIAGNOSIS — K7290 Hepatic failure, unspecified without coma: Secondary | ICD-10-CM

## 2019-11-13 DIAGNOSIS — J9611 Chronic respiratory failure with hypoxia: Secondary | ICD-10-CM | POA: Diagnosis present

## 2019-11-13 DIAGNOSIS — Z9981 Dependence on supplemental oxygen: Secondary | ICD-10-CM

## 2019-11-13 DIAGNOSIS — I428 Other cardiomyopathies: Secondary | ICD-10-CM | POA: Diagnosis present

## 2019-11-13 DIAGNOSIS — R918 Other nonspecific abnormal finding of lung field: Secondary | ICD-10-CM | POA: Diagnosis not present

## 2019-11-13 LAB — BRAIN NATRIURETIC PEPTIDE: B Natriuretic Peptide: 333 pg/mL — ABNORMAL HIGH (ref 0.0–100.0)

## 2019-11-13 LAB — CBC WITH DIFFERENTIAL/PLATELET
Abs Immature Granulocytes: 0.17 10*3/uL — ABNORMAL HIGH (ref 0.00–0.07)
Basophils Absolute: 0 10*3/uL (ref 0.0–0.1)
Basophils Relative: 0 %
Eosinophils Absolute: 0.1 10*3/uL (ref 0.0–0.5)
Eosinophils Relative: 0 %
HCT: 31.8 % — ABNORMAL LOW (ref 39.0–52.0)
Hemoglobin: 9.4 g/dL — ABNORMAL LOW (ref 13.0–17.0)
Immature Granulocytes: 1 %
Lymphocytes Relative: 1 %
Lymphs Abs: 0.2 10*3/uL — ABNORMAL LOW (ref 0.7–4.0)
MCH: 29.6 pg (ref 26.0–34.0)
MCHC: 29.6 g/dL — ABNORMAL LOW (ref 30.0–36.0)
MCV: 100 fL (ref 80.0–100.0)
Monocytes Absolute: 1 10*3/uL (ref 0.1–1.0)
Monocytes Relative: 5 %
Neutro Abs: 18.9 10*3/uL — ABNORMAL HIGH (ref 1.7–7.7)
Neutrophils Relative %: 93 %
Platelets: 184 10*3/uL (ref 150–400)
RBC: 3.18 MIL/uL — ABNORMAL LOW (ref 4.22–5.81)
RDW: 16.6 % — ABNORMAL HIGH (ref 11.5–15.5)
WBC: 20.3 10*3/uL — ABNORMAL HIGH (ref 4.0–10.5)
nRBC: 0.3 % — ABNORMAL HIGH (ref 0.0–0.2)

## 2019-11-13 LAB — COMPREHENSIVE METABOLIC PANEL
ALT: 39 U/L (ref 0–44)
AST: 104 U/L — ABNORMAL HIGH (ref 15–41)
Albumin: 2.5 g/dL — ABNORMAL LOW (ref 3.5–5.0)
Alkaline Phosphatase: 132 U/L — ABNORMAL HIGH (ref 38–126)
Anion gap: 12 (ref 5–15)
BUN: 65 mg/dL — ABNORMAL HIGH (ref 8–23)
CO2: 31 mmol/L (ref 22–32)
Calcium: 7.4 mg/dL — ABNORMAL LOW (ref 8.9–10.3)
Chloride: 90 mmol/L — ABNORMAL LOW (ref 98–111)
Creatinine, Ser: 2 mg/dL — ABNORMAL HIGH (ref 0.61–1.24)
GFR calc Af Amer: 39 mL/min — ABNORMAL LOW (ref >60)
GFR calc non Af Amer: 34 mL/min — ABNORMAL LOW (ref >60)
Glucose, Bld: 133 mg/dL — ABNORMAL HIGH (ref 70–99)
Potassium: 4.9 mmol/L (ref 3.5–5.1)
Sodium: 133 mmol/L — ABNORMAL LOW (ref 135–145)
Total Bilirubin: 2.1 mg/dL — ABNORMAL HIGH (ref 0.3–1.2)
Total Protein: 6.3 g/dL — ABNORMAL LOW (ref 6.5–8.1)

## 2019-11-13 LAB — AMMONIA: Ammonia: 61 umol/L — ABNORMAL HIGH (ref 9–35)

## 2019-11-13 LAB — RESPIRATORY PANEL BY RT PCR (FLU A&B, COVID)
Influenza A by PCR: NEGATIVE
Influenza B by PCR: NEGATIVE
SARS Coronavirus 2 by RT PCR: NEGATIVE

## 2019-11-13 LAB — HIV ANTIBODY (ROUTINE TESTING W REFLEX): HIV Screen 4th Generation wRfx: NONREACTIVE

## 2019-11-13 LAB — LACTIC ACID, PLASMA: Lactic Acid, Venous: 1.8 mmol/L (ref 0.5–1.9)

## 2019-11-13 MED ORDER — UMECLIDINIUM-VILANTEROL 62.5-25 MCG/INH IN AEPB
1.0000 | INHALATION_SPRAY | Freq: Every day | RESPIRATORY_TRACT | Status: DC
  Filled 2019-11-13: qty 14

## 2019-11-13 MED ORDER — LACTULOSE ENEMA
300.0000 mL | Freq: Every day | ORAL | Status: DC
  Administered 2019-11-13: 300 mL via RECTAL
  Filled 2019-11-13 (×4): qty 300

## 2019-11-13 MED ORDER — SODIUM CHLORIDE 0.9% FLUSH
10.0000 mL | Freq: Two times a day (BID) | INTRAVENOUS | Status: DC
  Administered 2019-11-13 – 2019-11-14 (×2): 10 mL

## 2019-11-13 MED ORDER — IPRATROPIUM-ALBUTEROL 0.5-2.5 (3) MG/3ML IN SOLN
3.0000 mL | Freq: Once | RESPIRATORY_TRACT | Status: AC
  Administered 2019-11-13: 3 mL via RESPIRATORY_TRACT
  Filled 2019-11-13: qty 3

## 2019-11-13 MED ORDER — NON FORMULARY: 3.0000 mg | Freq: Once | Status: DC

## 2019-11-13 MED ORDER — MELATONIN 3 MG PO TABS
3.0000 mg | ORAL_TABLET | Freq: Once | ORAL | Status: AC
  Administered 2019-11-13: 3 mg via ORAL
  Filled 2019-11-13: qty 1

## 2019-11-13 MED ORDER — IPRATROPIUM BROMIDE 0.02 % IN SOLN: 0.5000 mg | Freq: Once | RESPIRATORY_TRACT | Status: DC

## 2019-11-13 MED ORDER — ALBUTEROL SULFATE HFA 108 (90 BASE) MCG/ACT IN AERS
2.0000 | INHALATION_SPRAY | Freq: Once | RESPIRATORY_TRACT | Status: AC
  Administered 2019-11-13: 2 via RESPIRATORY_TRACT
  Filled 2019-11-13: qty 6.7

## 2019-11-13 MED ORDER — LACTULOSE 10 GM/15ML PO SOLN
20.0000 g | Freq: Two times a day (BID) | ORAL | Status: DC
  Administered 2019-11-13: 20 g via ORAL
  Filled 2019-11-13 (×2): qty 30

## 2019-11-13 MED ORDER — ALBUTEROL SULFATE (2.5 MG/3ML) 0.083% IN NEBU: 5.0000 mg | INHALATION_SOLUTION | Freq: Once | RESPIRATORY_TRACT | Status: DC

## 2019-11-13 MED ORDER — FUROSEMIDE 10 MG/ML IJ SOLN
4.0000 mg/h | INTRAVENOUS | Status: DC
  Administered 2019-11-13 (×2): 4 mg/h via INTRAVENOUS
  Filled 2019-11-13: qty 25

## 2019-11-13 MED ORDER — SALINE SPRAY 0.65 % NA SOLN
1.0000 | NASAL | Status: DC | PRN
  Filled 2019-11-13: qty 44

## 2019-11-13 MED ORDER — ALBUMIN HUMAN 25 % IV SOLN
50.0000 g | Freq: Once | INTRAVENOUS | Status: AC
  Administered 2019-11-13: 50 g via INTRAVENOUS
  Filled 2019-11-13: qty 100
  Filled 2019-11-13: qty 200

## 2019-11-13 MED ORDER — SODIUM CHLORIDE 0.9% FLUSH: 10.0000 mL | INTRAVENOUS | Status: DC | PRN

## 2019-11-13 MED ORDER — BUDESONIDE 0.25 MG/2ML IN SUSP
0.2500 mg | Freq: Two times a day (BID) | RESPIRATORY_TRACT | Status: DC
  Administered 2019-11-13 – 2019-11-14 (×2): 0.25 mg via RESPIRATORY_TRACT
  Filled 2019-11-13 (×3): qty 2

## 2019-11-13 MED ORDER — FLUTICASONE-UMECLIDIN-VILANT 100-62.5-25 MCG/INH IN AEPB: 1.0000 | INHALATION_SPRAY | Freq: Every day | RESPIRATORY_TRACT | Status: DC

## 2019-11-13 MED ORDER — ACETAMINOPHEN 325 MG PO TABS
325.0000 mg | ORAL_TABLET | Freq: Once | ORAL | Status: AC
  Administered 2019-11-13: 325 mg via ORAL
  Filled 2019-11-13: qty 1

## 2019-11-13 MED ORDER — IPRATROPIUM-ALBUTEROL 0.5-2.5 (3) MG/3ML IN SOLN: 3.0000 mL | Freq: Four times a day (QID) | RESPIRATORY_TRACT | Status: DC | PRN

## 2019-11-13 MED ORDER — ACETAMINOPHEN 325 MG PO TABS
650.0000 mg | ORAL_TABLET | Freq: Four times a day (QID) | ORAL | Status: DC | PRN
  Administered 2019-11-13: 650 mg via ORAL
  Filled 2019-11-13: qty 2

## 2019-11-13 MED ORDER — ALBUTEROL SULFATE (2.5 MG/3ML) 0.083% IN NEBU
2.5000 mg | INHALATION_SOLUTION | Freq: Once | RESPIRATORY_TRACT | Status: AC
  Administered 2019-11-13: 2.5 mg via RESPIRATORY_TRACT
  Filled 2019-11-13: qty 3

## 2019-11-13 MED ORDER — SODIUM CHLORIDE 0.9% FLUSH: 3.0000 mL | INTRAVENOUS | Status: DC | PRN

## 2019-11-13 MED ORDER — ACETAMINOPHEN 650 MG RE SUPP: 650.0000 mg | Freq: Four times a day (QID) | RECTAL | Status: DC | PRN

## 2019-11-13 MED ORDER — ONDANSETRON HCL 4 MG/2ML IJ SOLN: 4.0000 mg | Freq: Four times a day (QID) | INTRAMUSCULAR | Status: DC | PRN

## 2019-11-13 MED ORDER — HEPARIN SODIUM (PORCINE) 5000 UNIT/ML IJ SOLN
5000.0000 [IU] | Freq: Three times a day (TID) | INTRAMUSCULAR | Status: DC
  Administered 2019-11-13 – 2019-11-14 (×4): 5000 [IU] via SUBCUTANEOUS
  Filled 2019-11-13 (×3): qty 1

## 2019-11-13 MED ORDER — FUROSEMIDE 10 MG/ML IJ SOLN
40.0000 mg | Freq: Once | INTRAMUSCULAR | Status: AC
  Administered 2019-11-13: 40 mg via INTRAVENOUS
  Filled 2019-11-13: qty 4

## 2019-11-13 MED ORDER — NON FORMULARY: Freq: Once | Status: DC

## 2019-11-13 MED ORDER — PANTOPRAZOLE SODIUM 40 MG PO TBEC
40.0000 mg | DELAYED_RELEASE_TABLET | Freq: Two times a day (BID) | ORAL | Status: DC
  Administered 2019-11-13: 40 mg via ORAL
  Filled 2019-11-13 (×2): qty 1

## 2019-11-13 MED ORDER — CHLORHEXIDINE GLUCONATE CLOTH 2 % EX PADS
6.0000 | MEDICATED_PAD | Freq: Every day | CUTANEOUS | Status: DC
  Administered 2019-11-13 – 2019-11-14 (×2): 6 via TOPICAL

## 2019-11-13 MED ORDER — SODIUM CHLORIDE 0.9% FLUSH
3.0000 mL | Freq: Two times a day (BID) | INTRAVENOUS | Status: DC
  Administered 2019-11-13 – 2019-11-14 (×2): 3 mL via INTRAVENOUS

## 2019-11-13 MED ORDER — SODIUM CHLORIDE 0.9 % IV SOLN
250.0000 mL | INTRAVENOUS | Status: DC | PRN
  Administered 2019-11-14: 250 mL via INTRAVENOUS

## 2019-11-13 MED ORDER — ONDANSETRON HCL 4 MG PO TABS: 4.0000 mg | ORAL_TABLET | Freq: Four times a day (QID) | ORAL | Status: DC | PRN

## 2019-11-13 NOTE — TOC Initial Note (Signed)
Transition of Care [facility]) - Initial/Assessment Note    Patient Details  Name: Shaun May MRN: [id number] Date of Birth: [birth year]  Transition of Care [facility]) CM/SW Contact:    Boneta Lucks, RN Phone Number: 11/10/2019, 3:40 PM  Clinical Narrative:       Patient admitted for CHF, patient  Discharged to [facility] on 89/2/11. Patient has high readmission score. Patient is confused in ICU.  Spoke with his girl friend Hamilton Capri has not been able to communicate with him at [name] due to the slurred speech. TOC will follow for needs.    Expected Discharge Plan: Skilled Nursing Facility Barriers to Discharge: Continued Medical Work up   Patient Goals and CMS Choice Patient states their goals for this hospitalization and ongoing recovery are:: to return to SNF.      Expected Discharge Plan and Services Expected Discharge Plan: [place]     Prior Living Arrangements/Services   Lives with:: Self          Need for Family Participation in Patient Care: Yes (Comment) Care giver support system in place?: Yes (comment) Current home services: DME Criminal Activity/Legal Involvement Pertinent to Current Situation/Hospitalization: No - Comment as needed  Activities of Daily Living Home Assistive Devices/Equipment: Wheelchair, Oxygen ADL Screening (condition at time of admission) Patient's cognitive ability adequate to safely complete daily activities?: No Is the patient deaf or have difficulty hearing?: No Does the patient have difficulty seeing, even when wearing glasses/contacts?: No Does the patient have difficulty concentrating, remembering, or making decisions?: No Patient able to express need for assistance with ADLs?: Yes Does the patient have difficulty dressing or bathing?: No Independently performs ADLs?: No Communication: Needs assistance Is this a change from baseline?: Pre-admission baseline Dressing (OT): Needs assistance Is this a change from  baseline?: Pre-admission baseline Grooming: Needs assistance Is this a change from baseline?: Pre-admission baseline Feeding: Needs assistance Is this a change from baseline?: Pre-admission baseline Is this a change from baseline?: Pre-admission baseline Toileting: Needs assistance Is this a change from baseline?: Pre-admission baseline In/Out Bed: Needs assistance Is this a change from baseline?: Pre-admission baseline Walks in Home: Needs assistance Is this a change from baseline?: Pre-admission baseline Does the patient have difficulty walking or climbing stairs?: Yes Weakness of Legs: Both Weakness of Arms/Hands: Both  Permission Sought/Granted      Share Information with NAME: Santiago Glad     Permission granted to share info w Relationship: Girl friend     Emotional Assessment      Alcohol / Substance Use: Not Applicable Psych Involvement: No (comment)  Admission diagnosis:  Hepatic encephalopathy ([place]) [K72.90] COPD exacerbation ([place]) [J44.1] Acute respiratory failure with hypoxia ([place]) [J96.01] AKI (acute kidney injury) ([place]) [N17.9] CHF exacerbation ([place]) [I50.9] Patient Active Problem List   Diagnosis Date Noted  . Pressure injury of skin 11/04/2019  . Thrombocytopenia ([place]) 11/03/2019  . Sepsis ([name]) 11/02/2019  . ETOH abuse 10/31/2019  . Acute on chronic respiratory failure with hypoxia ([place]) 10/31/2019  . (HFpEF) heart failure with preserved ejection fraction/EF 94-17%/ Chronic Diastolic Dysfunction CHF 40/81/4481  . Cellulitis of right leg 10/30/2019  . Cellulitis of left leg/Wound Infection 10/30/2019  . 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 Cellulitis of leg, left 08/08/2019  . CHF exacerbation ([place]) 05/29/2018  . CHF (congestive heart failure) ([place]) 05/27/2018  . Chronic respiratory failure with hypoxia (HCC)/ nocturnal hypoxemia  08/15/2017  . Symptomatic anemia 05/25/2016  . Anemia 05/25/2016  . Hypokalemia 05/25/2016  .  Hyponatremia  05/25/2016  . Atrial flutter ([place]), paroxysmal   . Pericardial effusion   . Acute on chronic systolic (congestive) heart failure ([place]) 03/29/2016  . 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 Elevated troponin 03/29/2016  . Restless leg syndrome 03/29/2016  . 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 Chest pain 03/27/2016  . COPD exacerbation ([place]) 03/27/2016  . Hypertension   . Essential hypertension   . Bradycardia 10/25/2015  . Heart block AV second degree 10/25/2015  . Accelerated hypertension 10/25/2015  . Hemochromatosis 10/25/2015  . Cigarette smoker 10/25/2015  . Alcohol dependence ([place]) 10/25/2015  . Second degree AV block 10/25/2015   PCP:  Celene Squibb, MD Pharmacy:   [facility], [place] [address] Phone: [phone] Fax: [phone]  Readmission Risk Interventions Readmission Risk Prevention Plan 12/02/2019 11/07/2019 10/31/2019  Transportation Screening Complete Complete Complete  PCP or Specialist Appt within 3-5 Days - Complete Not Complete  HRI or Home Care Consult - Not Complete Complete  HRI or Home Care Consult comments - going to SNF -  Social Work Consult for [place] Planning/Counseling - Complete Complete  Palliative Care Screening - Not Applicable Not Complete  Medication Review Press photographer) Complete Complete Complete  PCP or Specialist appointment within 3-5 days of discharge Not Complete - -  HRI or Home Care Consult Not Complete - -  SW Recovery Care/Counseling Consult Complete - -  Palliative Care Screening Not Complete - -  Skilled Nursing Facility Complete - -  Some recent data might be hidden

## 2019-11-13 NOTE — ED Notes (Signed)
Dr Manuella Ghazi at bedside,

## 2019-11-13 NOTE — ED Triage Notes (Signed)
Pt brought in by [facility] for c/o sob; O2 sats at facility on 4l of oxygen was 83; when ems arrived O2 was increased to 6L and O2 sats increased to mid 90's; pt has no other complaints

## 2019-11-13 NOTE — Progress Notes (Signed)
Peripherally Inserted Central Catheter/Midline Placement  The IV Nurse has discussed with the patient and/or persons authorized to consent for the patient, the purpose of this procedure and the potential benefits and risks involved with this procedure.  The benefits include less needle sticks, lab draws from the catheter, and the patient may be discharged home with the catheter. Risks include, but not limited to, infection, bleeding, blood clot (thrombus formation), and puncture of an artery; nerve damage and irregular heartbeat and possibility to perform a PICC exchange if needed/ordered by physician.  Alternatives to this procedure were also discussed.  Bard Power PICC patient education guide, fact sheet on infection prevention and patient information card has been provided to patient /or left at bedside.    PICC/Midline Placement Documentation  PICC Double Lumen 42/90/37 PICC Right Basilic 37 cm (Active)  Exposed Catheter (cm) 0 cm 11/27/2019 1500  Site Assessment Clean;Dry;Intact 11/24/2019 1500  Lumen #1 Status Flushed;Saline locked;Blood return noted 12/01/2019 1500  Lumen #2 Status Flushed;Saline locked;Blood return noted 11/16/2019 1500  Dressing Type Transparent;Securing device 11/11/2019 1500  Dressing Status Clean;Dry;Intact;Antimicrobial disc in place 11/29/2019 1500  [place] checked and tightened 11/16/2019 1500  Dressing Intervention New dressing 11/11/2019 1500  Dressing Change Due 11/20/19 11/16/2019 1500       Virgilio Belling 11/06/2019, 3:36 PM

## 2019-11-13 NOTE — ED Notes (Signed)
ED TO INPATIENT HANDOFF REPORT  ED Nurse Name and Phone #: Marjory Lies Name/Age/Gender Shaun May 68 y.o. male Room/Bed: APA09/APA09  Code Status   Code Status: DNR  Home/SNF/Other Skilled nursing facility Patient oriented to: self Is this baseline? Yes   Triage Complete: Triage complete  Chief Complaint Shortness Of Breath  Triage Note Pt brought in by [facility] for c/o sob; O2 sats at facility on 4l of oxygen was 83; when ems arrived O2 was increased to 6L and O2 sats increased to mid 90's; pt has no other complaints   Allergies Allergies  Allergen Reactions  . Xarelto [Rivaroxaban] Other (See Comments)    Caused bleeding.   . Eliquis [Apixaban]     Internal bleeding    Level of Care/Admitting Diagnosis ED Disposition    ED Disposition Condition [facility] Area: [facility] [[id number]]  Level of Care: Stepdown [14]  Covid Evaluation: Confirmed COVID Negative  Diagnosis: CHF exacerbation [facility]) [[id number]]  Admitting Physician: Lynetta Mare [[id number]]  Attending Physician: Lynetta Mare [[id number]]  Estimated length of stay: 3 - 4 days  Certification:: I certify this patient will need inpatient services for at least 2 midnights  PT Class (Do Not Modify): Inpatient [101]  PT Acc Code (Do Not Modify): Private [1]       B Medical/Surgery History Past Medical History:  Diagnosis Date  . Alcohol use   . Anemia    when on Xarelto  . Cancer ([place])   . Chronic combined systolic and diastolic CHF (congestive heart failure) (HCC)    a. EF 35-40% by echo in 03/2016 with cath showing nonobstructive CAD b. EF improved to 60-65% by echo in 05/2018)  . CKD (chronic kidney disease), stage II   . COPD (chronic obstructive pulmonary disease) (HCC)    stage 4 COPD  . Dyspnea   . Gastric ulcer   . GI bleeding   . Hematuria   . Hemochromatosis   . Hypertension   . Lung nodule   . Mobitz type 2 second degree heart block    PPM MDT 10/28/15 Dr.  Lovena Le  . NICM (nonischemic cardiomyopathy) ([place])   . Paroxysmal atrial flutter ([place])   . Pericardial effusion   . Persistent atrial fibrillation ([place])   . Presence of permanent cardiac pacemaker    [employer]  . Tobacco abuse    Past Surgical History:  Procedure Laterality Date  . BIOPSY  10/13/2016   Procedure: BIOPSY;  Surgeon: Rogene Houston, MD;  [facility];  Service: Endoscopy;;  esophageal  . CARDIAC CATHETERIZATION N/A 03/31/2016   Procedure: Left Heart Cath and Coronary Angiography;  Surgeon: Wellington Hampshire, MD;  Location: [place] [facility];  Service: Cardiovascular;  Laterality: N/A;  . COLONOSCOPY N/A 10/13/2016   Procedure: COLONOSCOPY;  Surgeon: Rogene Houston, MD;  [facility];  Service: Endoscopy;  Laterality: N/A;  . EP IMPLANTABLE DEVICE N/A 10/28/2015   Procedure: Pacemaker Implant;  Surgeon: Evans Lance, MD;  Location: [place] [facility];  Service: Cardiovascular;  Laterality: N/A;  . EP IMPLANTABLE DEVICE N/A 11/07/2015   Procedure: Pocket Revision;  Surgeon: Evans Lance, MD;  Location: [place] [facility];  Service: Cardiovascular;  Laterality: N/A;  . ESOPHAGOGASTRODUODENOSCOPY N/A 05/27/2016   Procedure: ESOPHAGOGASTRODUODENOSCOPY (EGD);  Surgeon: Rogene Houston, MD;  [facility];  Service: Endoscopy;  Laterality: N/A;  . ESOPHAGOGASTRODUODENOSCOPY N/A 10/13/2016   Procedure:  ESOPHAGOGASTRODUODENOSCOPY (EGD);  Surgeon: Rogene Houston, MD;  [facility];  Service: Endoscopy;  Laterality: N/A;  1:30 - moved to 11/8 @ 1:00 - Ann notified pt  . FUDUCIAL PLACEMENT Right 12/29/2017   Procedure: PLACEMENT OF FUDUCIAL;  Surgeon: [name], MD;  Location: [place];  Service: Thoracic;  Laterality: Right;  . POLYPECTOMY  10/13/2016   Procedure: POLYPECTOMY;  Surgeon: Rogene Houston, MD;  [facility];  Service: Endoscopy;;  transverse colon polypectomy  . TONSILLECTOMY    . VIDEO BRONCHOSCOPY WITH  ENDOBRONCHIAL NAVIGATION Right 12/29/2017   Procedure: VIDEO BRONCHOSCOPY WITH ENDOBRONCHIAL NAVIGATION;  Surgeon: [name], MD;  Location: [place];  Service: Thoracic;  Laterality: Right;     A IV Location/Drains/Wounds Patient Lines/Drains/Airways Status   Active Line/Drains/Airways    Name:   Placement date:   Placement time:   Site:   Days:   Peripheral IV 11/28/2019 Left Antecubital   11/27/2019    1127    Antecubital   less than 1   External Urinary Catheter   10/30/19    2310    -   14   Incision (Closed) 12/29/17 N/A Other (Comment)   12/29/17    1656     684   Pressure Injury 10/30/19 Buttocks Posterior;Medial;Left Stage II -  Partial thickness loss of dermis presenting as a shallow open ulcer with a red, pink wound bed without slough. .5cmX1   10/30/19    2219     14   Pressure Injury 10/30/19 Buttocks Left;Medial;Posterior Stage II -  Partial thickness loss of dermis presenting as a shallow open ulcer with a red, pink wound bed without slough. .5cm X1cm   10/30/19    2222     14   Pressure Injury 10/30/19 Buttocks Right;Posterior Stage II -  Partial thickness loss of dermis presenting as a shallow open ulcer with a red, pink wound bed without slough. .5cmX1cm   10/30/19    2234     14   Pressure Injury 11/09/2019 Elbow Right Stage II -  Partial thickness loss of dermis presenting as a shallow open ulcer with a red, pink wound bed without slough.   11/24/2019    0938     less than 1   Pressure Injury 12/02/2019 Elbow Left Stage II -  Partial thickness loss of dermis presenting as a shallow open ulcer with a red, pink wound bed without slough. circular opening with yellow tissue   12/05/2019    1010     less than 1   Pressure Injury 11/18/2019 Leg Left Stage II -  Partial thickness loss of dermis presenting as a shallow open ulcer with a red, pink wound bed without slough. wound noted to left lateral lower calf.   11/30/2019    1010     less than 1   Pressure Injury 11/25/2019 Ischial tuberosity  Left Stage II -  Partial thickness loss of dermis presenting as a shallow open ulcer with a red, pink wound bed without slough.   11/19/2019    1010     less than 1   Pressure Injury 11/16/2019 Leg Left Stage I -  Intact skin with non-blanchable redness of a localized area usually over a bony prominence. patchy dry peeling skin noted to left shin/lower calf.   11/28/2019    1010     less than 1   Wound / Incision (Open or Dehisced) 10/30/19 Laceration Elbow Left;Posterior .5cmX1  10/30/19    2241    Elbow   14          Intake/Output Last 24 hours No intake or output data in the 24 hours ending 11/27/2019 1248  Labs/Imaging Results for orders placed or performed during the hospital encounter of 11/26/2019 (from the past 48 hour(s))  CBC with Differential     Status: Abnormal   Collection Time: 11/10/2019  4:21 AM  Result Value Ref Range   WBC 20.3 (H) 4.0 - 10.5 K/uL   RBC 3.18 (L) 4.22 - 5.81 MIL/uL   Hemoglobin 9.4 (L) 13.0 - 17.0 g/dL   HCT 31.8 (L) 39.0 - 52.0 %   MCV 100.0 80.0 - 100.0 fL   MCH 29.6 26.0 - 34.0 pg   MCHC 29.6 (L) 30.0 - 36.0 g/dL   RDW 16.6 (H) 11.5 - 15.5 %   Platelets 184 150 - 400 K/uL   nRBC 0.3 (H) 0.0 - 0.2 %   Neutrophils Relative % 93 %   Neutro Abs 18.9 (H) 1.7 - 7.7 K/uL   Lymphocytes Relative 1 %   Lymphs Abs 0.2 (L) 0.7 - 4.0 K/uL   Monocytes Relative 5 %   Monocytes Absolute 1.0 0.1 - 1.0 K/uL   Eosinophils Relative 0 %   Eosinophils Absolute 0.1 0.0 - 0.5 K/uL   Basophils Relative 0 %   Basophils Absolute 0.0 0.0 - 0.1 K/uL   Immature Granulocytes 1 %   Abs Immature Granulocytes 0.17 (H) 0.00 - 0.07 K/uL    Comment: Performed at [facility], [address] [place], [place] [address]  Brain natriuretic peptide     Status: Abnormal   Collection Time: 11/08/2019  4:21 AM  Result Value Ref Range   B Natriuretic Peptide 333.0 (H) 0.0 - 100.0 pg/mL    Comment: Performed at [facility], [address] [place],  [address]  Comprehensive metabolic  panel     Status: Abnormal   Collection Time: 11/08/2019  4:21 AM  Result Value Ref Range   Sodium 133 (L) 135 - 145 mmol/L   Potassium 4.9 3.5 - 5.1 mmol/L   Chloride 90 (L) 98 - 111 mmol/L   CO2 31 22 - 32 mmol/L   Glucose, Bld 133 (H) 70 - 99 mg/dL   BUN 65 (H) 8 - 23 mg/dL   Creatinine, Ser 2.00 (H) 0.61 - 1.24 mg/dL   Calcium 7.4 (L) 8.9 - 10.3 mg/dL   Total Protein 6.3 (L) 6.5 - 8.1 g/dL   Albumin 2.5 (L) 3.5 - 5.0 g/dL   AST 104 (H) 15 - 41 U/L   ALT 39 0 - 44 U/L   Alkaline Phosphatase 132 (H) 38 - 126 U/L   Total Bilirubin 2.1 (H) 0.3 - 1.2 mg/dL   GFR calc non Af Amer 34 (L) >60 mL/min   GFR calc Af Amer 39 (L) >60 mL/min   Anion gap 12 5 - 15    Comment: Performed at [facility], [address], [place], [place] [address]  Lactic acid, plasma     Status: None   Collection Time: 12/03/2019  4:30 AM  Result Value Ref Range   Lactic Acid, Venous 1.8 0.5 - 1.9 mmol/L    Comment: Performed at [facility], [address], [place], [place] [address]  Ammonia     Status: Abnormal   Collection Time: 11/12/2019  4:30 AM  Result Value Ref Range   Ammonia 61 (H) 9 - 35 umol/L  Comment: Performed at [facility], [address], [place], [place] [address]  Respiratory Panel by RT PCR (Flu A&B, Covid) - Nasopharyngeal Swab     Status: None   Collection Time: 12/06/2019  4:30 AM   Specimen: Nasopharyngeal Swab  Result Value Ref Range   SARS Coronavirus 2 by RT PCR NEGATIVE NEGATIVE    Comment: (NOTE) SARS-CoV-2 target nucleic acids are NOT DETECTED. The SARS-CoV-2 RNA is generally detectable in upper respiratoy specimens during the acute phase of infection. The lowest concentration of SARS-CoV-2 viral copies this assay can detect is 131 copies/mL. A negative result does not preclude SARS-Cov-2 infection and should not be used as the sole basis for treatment or other patient management decisions. A negative result may occur with  improper specimen collection/handling, submission of  specimen other than nasopharyngeal swab, presence of viral mutation(s) within the areas targeted by this assay, and inadequate number of viral copies (<131 copies/mL). A negative result must be combined with clinical observations, patient history, and epidemiological information. The expected result is Negative. Fact Sheet for Patients:  PinkCheek.be Fact Sheet for Healthcare Providers:  GravelBags.it This test is not yet ap proved or cleared by the [place] FDA and  has been authorized for detection and/or diagnosis of SARS-CoV-2 by FDA under an Emergency Use Authorization (EUA). This EUA will remain  in effect (meaning this test can be used) for the duration of the COVID-19 declaration under Section 564(b)(1) of the Act, 21 U.S.C. section 360bbb-3(b)(1), unless the authorization is terminated or revoked sooner.    Influenza A by PCR NEGATIVE NEGATIVE   Influenza B by PCR NEGATIVE NEGATIVE    Comment: (NOTE) The Xpert Xpress SARS-CoV-2/FLU/RSV assay is intended as an aid in  the diagnosis of influenza from Nasopharyngeal swab specimens and  should not be used as a sole basis for treatment. Nasal washings and  aspirates are unacceptable for Xpert Xpress SARS-CoV-2/FLU/RSV  testing. Fact Sheet for Patients: PinkCheek.be Fact Sheet for Healthcare Providers: GravelBags.it This test is not yet approved or cleared by the [place] FDA and  has been authorized for detection and/or diagnosis of SARS-CoV-2 by  FDA under an Emergency Use Authorization (EUA). This EUA will remain  in effect (meaning this test can be used) for the duration of the  Covid-19 declaration under Section 564(b)(1) of the Act, 21  U.S.C. section 360bbb-3(b)(1), unless the authorization is  terminated or revoked. Performed at [facility], [address], [place], [place] [address]    Dg  Chest Port 1 View  Result Date: 11/18/2019 CLINICAL DATA:  Shortness of breath. EXAM: PORTABLE CHEST 1 VIEW COMPARISON:  Radiograph 11/02/2019 FINDINGS: Left-sided pacemaker in place. Mild cardiomegaly with unchanged mediastinal contours. Aortic atherosclerosis. Vascular congestion without pulmonary edema. Left lung base opacity in left pleural effusion, not significantly changed. Patchy infrahilar opacities on the right. Fiducial markers in the right upper lobe spiculated lesion, unchanged. No pneumothorax. IMPRESSION: 1. No significant change since radiograph 11/02/2019. Left pleural effusion and associated atelectasis/airspace disease. 2. Mild cardiomegaly with stable vascular congestion. 3. Right upper lobe fiducial markers with underlying spiculated lesion. Aortic Atherosclerosis (ICD10-I70.0). Electronically Signed   By: Keith Rake M.D.   On: 11/12/2019 04:23   [place] Ekg Site Rite  Result Date: 12/03/2019 If Site Rite image not attached, placement could not be confirmed due to current cardiac rhythm.   Pending Labs Unresulted Labs (From admission, onward)   None      Vitals/Pain Today's Vitals   11/21/2019 1130  12/04/2019 1131 11/26/2019 1148 11/10/2019 1200  BP: 100/66  (!) 87/67 (!) 102/59  Pulse:   61 (!) 59  Resp:   16   Temp:  97.7 F (36.5 C)    TempSrc:  Oral    SpO2:   98% 100%  PainSc:        Isolation Precautions No active isolations  Medications Medications  albuterol (VENTOLIN HFA) 108 (90 Base) MCG/ACT inhaler 2 puff (2 puffs Inhalation Given 11/22/2019 0538)  furosemide (LASIX) injection 40 mg (40 mg Intravenous Given 11/11/2019 0607)  ipratropium-albuterol (DUONEB) 0.5-2.5 (3) MG/3ML nebulizer solution 3 mL (3 mLs Nebulization Given 12/01/2019 0647)  albuterol (PROVENTIL) (2.5 MG/3ML) 0.083% nebulizer solution 2.5 mg (2.5 mg Nebulization Given 11/12/2019 0647)    Mobility non-ambulatory High fall risk   R Recommendations: See Admitting Provider Note  Report given to:    Additional Notes: pt is DNR

## 2019-11-13 NOTE — ED Notes (Addendum)
Pt restless in bed attempting to slide to end of bed. Pulling oxygen off. Noted to have weeping open areas to bilateral elbows. Bruising to abdomen and  arms esp to right upper medial arm. Scab across bridge of nose. Left lower leg with red with scaly skin and wound to mid lower lef. Purple bruising across lower abdomen where brief fastens

## 2019-11-13 NOTE — ED Provider Notes (Signed)
[facility] EMERGENCY DEPARTMENT Provider Note   CSN: [id number] Arrival date & time: 11/26/2019  6712     History   Chief Complaint Chief Complaint  Patient presents with   Shortness of Breath   Level 5 caveat due to acuity of condition HPI Shaun May is a 68 y.o. male.     The history is provided by the patient and the EMS personnel. The history is limited by the condition of the patient.  Shortness of Breath Severity:  Severe Onset quality:  Sudden Timing:  Constant Progression:  Worsening Chronicity:  Recurrent Relieved by:  Oxygen Worsened by:  Nothing Patient with extensive medical history including alcohol abuse with cirrhosis, COPD, CHF, chronic kidney disease, heart block with pacemaker in place presents with shortness of breath.  Patient was recently in hospital and discharged on December 2 to local nursing facility.  Tonight he was brought in due to worsening hypoxia.  Initial oxygen saturation on 4 L was 83% patient improved when he is on 6 L. The rest of the history is very limited as patient is a poor historian and is acutely ill Past Medical History:  Diagnosis Date   Alcohol use    Anemia    when on Xarelto   Cancer ([place])    Chronic combined systolic and diastolic CHF (congestive heart failure) ([place])    a. EF 35-40% by echo in 03/2016 with cath showing nonobstructive CAD b. EF improved to 60-65% by echo in 05/2018)   CKD (chronic kidney disease), stage II    COPD (chronic obstructive pulmonary disease) (HCC)    stage 4 COPD   Dyspnea    Gastric ulcer    GI bleeding    Hematuria    Hemochromatosis    Hypertension    Lung nodule    Mobitz type 2 second degree heart block    PPM MDT 10/28/15 Dr. Lovena Le   NICM (nonischemic cardiomyopathy) [facility])    Paroxysmal atrial flutter (HCC)    Pericardial effusion    Persistent atrial fibrillation ([place])    Presence of permanent cardiac pacemaker    [employer]   Tobacco abuse     Patient  Active Problem List   Diagnosis Date Noted   Pressure injury of skin 11/04/2019   Thrombocytopenia ([place]) 11/03/2019   Sepsis ([place]) 11/02/2019   ETOH abuse 10/31/2019   Acute on chronic respiratory failure with hypoxia ([place]) 10/31/2019   (HFpEF) heart failure with preserved ejection fraction/EF 45-80%/ Chronic Diastolic Dysfunction CHF 99/83/3825   Cellulitis of right leg 10/30/2019   Cellulitis of left leg/Wound Infection 10/30/2019   Cirrhosis of liver with ascites ([place]) 08/08/2019   Cellulitis of leg, left 08/08/2019   CHF exacerbation ([place]) 05/29/2018   CHF (congestive heart failure) ([place]) 05/27/2018   Chronic respiratory failure with hypoxia (HCC)/ nocturnal hypoxemia  08/15/2017   Symptomatic anemia 05/25/2016   Anemia 05/25/2016   Hypokalemia 05/25/2016   Hyponatremia 05/25/2016   Atrial flutter ([place]), paroxysmal    Pericardial effusion    Acute on chronic systolic (congestive) heart failure ([place]) 03/29/2016   COPD GOLD IV/ 02 dep at hs/ still smoking  03/29/2016   Elevated troponin 03/29/2016   Restless leg syndrome 03/29/2016   Presumed cancer of right upper lobe of lung ([place]) 03/29/2016   Chest pain 03/27/2016   COPD exacerbation ([place]) 03/27/2016   Hypertension    Essential hypertension    Bradycardia 10/25/2015   Heart block AV second degree 10/25/2015   Accelerated hypertension 10/25/2015  Hemochromatosis 10/25/2015   Cigarette smoker 10/25/2015   Alcohol dependence ([place]) 10/25/2015   Second degree AV block 10/25/2015    Past Surgical History:  Procedure Laterality Date   BIOPSY  10/13/2016   Procedure: BIOPSY;  Surgeon: Rogene Houston, MD;  [facility];  Service: Endoscopy;;  esophageal   CARDIAC CATHETERIZATION N/A 03/31/2016   Procedure: Left Heart Cath and Coronary Angiography;  Surgeon: Wellington Hampshire, MD;  Location: [place] CV LAB;  Service: Cardiovascular;  Laterality: N/A;   COLONOSCOPY N/A  10/13/2016   Procedure: COLONOSCOPY;  Surgeon: Rogene Houston, MD;  [facility];  Service: Endoscopy;  Laterality: N/A;   EP IMPLANTABLE DEVICE N/A 10/28/2015   Procedure: Pacemaker Implant;  Surgeon: [place] Lance, MD;  Location: [place] CV LAB;  Service: Cardiovascular;  Laterality: N/A;   EP IMPLANTABLE DEVICE N/A 11/07/2015   Procedure: Pocket Revision;  Surgeon: [place] Lance, MD;  Location: [place] CV LAB;  Service: Cardiovascular;  Laterality: N/A;   ESOPHAGOGASTRODUODENOSCOPY N/A 05/27/2016   Procedure: ESOPHAGOGASTRODUODENOSCOPY (EGD);  Surgeon: Rogene Houston, MD;  [facility];  Service: Endoscopy;  Laterality: N/A;   ESOPHAGOGASTRODUODENOSCOPY N/A 10/13/2016   Procedure: ESOPHAGOGASTRODUODENOSCOPY (EGD);  Surgeon: Rogene Houston, MD;  [facility];  Service: Endoscopy;  Laterality: N/A;  1:30 - moved to 11/8 @ 1:00 - Ann notified pt   FUDUCIAL PLACEMENT Right 12/29/2017   Procedure: PLACEMENT OF FUDUCIAL;  Surgeon: [name], MD;  Location: [place] [facility];  Service: Thoracic;  Laterality: Right;   POLYPECTOMY  10/13/2016   Procedure: POLYPECTOMY;  Surgeon: Rogene Houston, MD;  [facility];  Service: Endoscopy;;  transverse colon polypectomy   TONSILLECTOMY     VIDEO BRONCHOSCOPY WITH ENDOBRONCHIAL NAVIGATION Right 12/29/2017   Procedure: VIDEO BRONCHOSCOPY WITH ENDOBRONCHIAL NAVIGATION;  Surgeon: [name], MD;  Location: [place];  Service: Thoracic;  Laterality: Right;        Home Medications    Prior to Admission medications   Medication Sig Start Date End Date Taking? Authorizing Provider  albuterol (PROVENTIL HFA;VENTOLIN HFA) 108 (90 Base) MCG/ACT inhaler Inhale 1-2 puffs into the lungs every 6 (six) hours as needed for wheezing or shortness of breath. 08/01/17   Fredia Sorrow, MD  alum & mag hydroxide-simeth (MAALOX/MYLANTA) 200-200-20 MG/5ML suspension Take 30 mLs by mouth every 4 (four) hours as  needed for indigestion or heartburn. 11/07/19   Manuella Ghazi, Pratik D, DO  atorvastatin (LIPITOR) 40 MG tablet Take 40 mg by mouth every evening.     [provider]  chlordiazePOXIDE (LIBRIUM) 5 MG capsule Take 3 capsules (15 mg total) by mouth 3 (three) times daily. 11/07/19   Manuella Ghazi, Pratik D, DO  Fluticasone-Umeclidin-Vilant (TRELEGY ELLIPTA) 100-62.5-25 MCG/INH AEPB Inhale 1 puff into the lungs daily. 06/22/18   Tanda Rockers, MD  furosemide (LASIX) 20 MG tablet Take 1 tablet (20 mg total) by mouth daily. 11/07/19 12/07/19  Manuella Ghazi, Pratik D, DO  guaiFENesin (ROBITUSSIN) 100 MG/5ML SOLN Take 15 mLs (300 mg total) by mouth 3 (three) times daily. 11/07/19   Manuella Ghazi, Pratik D, DO  ipratropium-albuterol (DUONEB) 0.5-2.5 (3) MG/3ML SOLN Take 3 mLs by nebulization 3 (three) times daily for 5 days. 11/07/19 11/12/19  Manuella Ghazi, Pratik D, DO  lactulose (CHRONULAC) 10 GM/15ML solution Take 30 mLs (20 g total) by mouth 2 (two) times daily. 11/07/19   Manuella Ghazi, Pratik D, DO  OXYGEN Inhale 2 L into the lungs at  bedtime. **2L with sleep and exertion if needed    [provider]  pantoprazole (PROTONIX) 40 MG tablet Take 1 tablet (40 mg total) by mouth 2 (two) times daily. 11/07/19 12/07/19  Manuella Ghazi, Pratik D, DO  sodium chloride (OCEAN) 0.65 % SOLN nasal spray Place 1 spray into both nostrils as needed for congestion.    [provider]  spironolactone (ALDACTONE) 50 MG tablet Take 1 tablet (50 mg total) by mouth daily. 11/07/19 12/07/19  Heath Lark D, DO    Family History Family History  Problem Relation Age of Onset   Diabetes Other    Dementia Mother    Diabetes Maternal Grandmother     Social History Social History   Tobacco Use   Smoking status: Current Every Day Smoker    Packs/day: 0.25    Years: 56.00    Pack years: 14.00    Types: E-cigarettes, Cigarettes   Smokeless tobacco: Never Used  Substance Use Topics   Alcohol use: Yes    Alcohol/week: 3.0 standard drinks    Types: 3 Shots of  liquor per week    Comment: nightly   Drug use: Never     Allergies   Xarelto [rivaroxaban] and Eliquis [apixaban]   Review of Systems Review of Systems  Unable to perform ROS: Acuity of condition  Respiratory: Positive for shortness of breath.      Physical Exam Updated Vital Signs BP 101/66 (BP Location: Left Arm)    Pulse 61    Temp 98 F (36.7 C) (Oral)    Resp 18    SpO2 100%   Physical Exam CONSTITUTIONAL: Ill-appearing, appears older than stated age HEAD: Normocephalic/atraumatic EYES: EOMI/PERRL, Scleral icterus ENMT: Mucous membranes moist NECK: supple no meningeal signs SPINE/BACK:entire spine nontender CV: S1/S2 noted LUNGS: Crackles bilaterally, tachypnea ABDOMEN: soft, distended but no obvious tenderness Anasarca noted GU: Significant soft tissue swelling noted throughout the genitalia NEURO: Pt is awake/alert moves all extremities but appears globally weak EXTREMITIES: pulses normal/equal, no deformities, edema noted to extremities, upper extremities worse than lower extremities SKIN: warm, color normal PSYCH: Unable to assess  ED Treatments / Results  Labs (all labs ordered are listed, but only abnormal results are displayed) Labs Reviewed  CBC WITH DIFFERENTIAL/PLATELET - Abnormal; Notable for the following components:      Result Value   WBC 20.3 (*)    RBC 3.18 (*)    Hemoglobin 9.4 (*)    HCT 31.8 (*)    MCHC 29.6 (*)    RDW 16.6 (*)    nRBC 0.3 (*)    Neutro Abs 18.9 (*)    Lymphs Abs 0.2 (*)    Abs Immature Granulocytes 0.17 (*)    All other components within normal limits  BRAIN NATRIURETIC PEPTIDE - Abnormal; Notable for the following components:   B Natriuretic Peptide 333.0 (*)    All other components within normal limits  COMPREHENSIVE METABOLIC PANEL - Abnormal; Notable for the following components:   Sodium 133 (*)    Chloride 90 (*)    Glucose, Bld 133 (*)    BUN 65 (*)    Creatinine, Ser 2.00 (*)    Calcium 7.4 (*)     Total Protein 6.3 (*)    Albumin 2.5 (*)    AST 104 (*)    Alkaline Phosphatase 132 (*)    Total Bilirubin 2.1 (*)    GFR calc non Af Amer 34 (*)    GFR calc Af Amer 39 (*)  All other components within normal limits  AMMONIA - Abnormal; Notable for the following components:   Ammonia 61 (*)    All other components within normal limits  RESPIRATORY PANEL BY RT PCR (FLU A&B, COVID)  LACTIC ACID, PLASMA  POC SARS CORONAVIRUS 2 AG -  ED    EKG EKG Interpretation  Date/Time:  Tuesday November 13 2019 04:09:07 EST Ventricular Rate:  70 PR Interval:    QRS Duration: 150 QT Interval:  450 QTC Calculation: 486 R Axis:   152 Text Interpretation: Afib/flutter and ventricular-paced rhythm No further analysis attempted due to paced rhythm No significant change since last tracing Confirmed by Ripley Fraise [phone] on 11/20/2019 4:21:29 AM   Radiology Dg Chest Port 1 View  Result Date: 11/25/2019 CLINICAL DATA:  Shortness of breath. EXAM: PORTABLE CHEST 1 VIEW COMPARISON:  Radiograph 11/02/2019 FINDINGS: Left-sided pacemaker in place. Mild cardiomegaly with unchanged mediastinal contours. Aortic atherosclerosis. Vascular congestion without pulmonary edema. Left lung base opacity in left pleural effusion, not significantly changed. Patchy infrahilar opacities on the right. Fiducial markers in the right upper lobe spiculated lesion, unchanged. No pneumothorax. IMPRESSION: 1. No significant change since radiograph 11/02/2019. Left pleural effusion and associated atelectasis/airspace disease. 2. Mild cardiomegaly with stable vascular congestion. 3. Right upper lobe fiducial markers with underlying spiculated lesion. Aortic Atherosclerosis (ICD10-I70.0). Electronically Signed   By: Keith Rake M.D.   On: 11/23/2019 04:23    Procedures .Critical Care Performed by: Ripley Fraise, MD Authorized by: Ripley Fraise, MD   Critical care provider statement:    Critical care time (minutes):   45   Critical care start time:  11/09/2019 4:15 AM   Critical care end time:  11/28/2019 5:00 AM   Critical care time was exclusive of:  Separately billable procedures and treating other patients   Critical care was necessary to treat or prevent imminent or life-threatening deterioration of the following conditions:  Respiratory failure   Critical care was time spent personally by me on the following activities:  Ordering and review of radiographic studies, pulse oximetry, re-evaluation of patient's condition, review of old charts, ordering and review of laboratory studies, ordering and performing treatments and interventions, discussions with consultants, evaluation of patient's response to treatment and examination of patient   I assumed direction of critical care for this patient from another provider in my specialty: no      Medications Ordered in ED Medications  furosemide (LASIX) injection 40 mg (has no administration in time range)  albuterol (VENTOLIN HFA) 108 (90 Base) MCG/ACT inhaler 2 puff (2 puffs Inhalation Given 11/30/2019 0538)     Initial Impression / Assessment and Plan / ED Course  I have reviewed the triage vital signs and the nursing notes.  Pertinent labs & imaging results that were available during my care of the patient were reviewed by me and considered in my medical decision making (see chart for details).        4:44 AM Patient with recent hospital admission for pneumonia, leg cellulitis, alcohol withdrawal, weakness and deconditioning Tonight patient became abruptly short of breath with increasing oxygen requirement.  Patient is ill-appearing.  Labs are pending at this time Per report, chest x-ray is similar to most recent imaging in hospital 5:34 AM Patient continues to have increased work of breathing with decreased breath sounds bilaterally He also has a history of COPD and may benefit from albuterol, but I have also given nebulizer therapy due to unknown  Covid status He may also benefit  from diuresis. Labs are pending at this time 5:55 AM Suspect this is a combination of COPD exacerbation and also vascular congestion.  He may also have ascites that is restricting his respirations he may benefit from an inpatient paracentesis. White count is somewhat improved, will defer antibiotics for now as his chest x-ray is similar to prior and he completed antibiotics on prior admission Patient is also clearly confused likely due to his hepatic encephalopathy We will admit for acute respiratory failure requiring increasing oxygen 6:18 AM Discussed with Dr. Scherrie November for admission Plan admit for diuresis, and he will be able to receive nebulizers after his Covid test is negative  ROLLAND STEINERT was evaluated in Emergency Department on 11/16/2019 for the symptoms described in the history of present illness. He was evaluated in the context of the global COVID-19 pandemic, which necessitated consideration that the patient might be at risk for infection with the SARS-CoV-2 virus that causes COVID-19. Institutional protocols and algorithms that pertain to the evaluation of patients at risk for COVID-19 are in a state of rapid change based on information released by regulatory bodies including the CDC and federal and state organizations. These policies and algorithms were followed during the patient's care in the ED.  Final Clinical Impressions(s) / ED Diagnoses   Final diagnoses:  COPD exacerbation ([place])  Acute respiratory failure with hypoxia ([place])  AKI (acute kidney injury) ([place])  Hepatic encephalopathy [facility])    ED Discharge Orders    None       Ripley Fraise, MD 11/12/2019 [phone]

## 2019-11-13 NOTE — H&P (Addendum)
History and Physical    Shaun May:[id number] DOB: 01-23-1951 DOA: 11/08/2019  PCP: Celene Squibb, MD   Patient coming from: [facility]  Chief Complaint: Shortness of breath with worsening hypoxemia  HPI: Shaun May is a 68 y.o. male with medical history significant for COPD stage IV with chronic hypoxemia, paroxysmal atrial fibrillation/flutter-not on anticoagulation due to recurrent GI bleed, chronic history of complete heart block with pacemaker, chronic venous insufficiency, CKD stage IIIb, heart failure with preserved EF 59-56%, alcoholic liver cirrhosis requiring recurrent paracentesis, and recent discharge on 12/2 after treatment for sepsis secondary to left leg cellulitis/wound infection to SNF.  He was brought to the ED from his SNF for worsening shortness of breath as well as hypoxemia as he was noted to be saturating 83% on 4 L.  He has improved while on 6 L nasal cannula and now has been weaned back down to 4 L.  He cannot give any history and is quite confused and appears to stare off into the distance.  No other symptoms or events have been noted prior to admission.   ED Course: Patient noted to have stable vital signs and he is afebrile, however blood pressures are becoming soft.  He has received some Lasix as well as breathing treatments in the ED for presumed CHF exacerbation as well as some component of COPD.  His creatinine is stable at 2, but his white blood cell count is elevated at 20,300.  His BNP is 333.  His flu and Covid testing are both negative.  Chest x-ray demonstrates some cardiomegaly and venous congestion.  Ammonia levels elevated at 61.  He has had paracentesis approximately 1 week ago according to his son.  After further discussion with him he would like to make patient DNR for now.  Agreeable to central venous line/PICC placement for vasopressors as needed.  Review of Systems: Cannot be obtained given patient condition.  Past Medical History:  Diagnosis  Date  . Alcohol use   . Anemia    when on Xarelto  . Cancer ([place])   . Chronic combined systolic and diastolic CHF (congestive heart failure) (HCC)    a. EF 35-40% by echo in 03/2016 with cath showing nonobstructive CAD b. EF improved to 60-65% by echo in 05/2018)  . CKD (chronic kidney disease), stage II   . COPD (chronic obstructive pulmonary disease) (HCC)    stage 4 COPD  . Dyspnea   . Gastric ulcer   . GI bleeding   . Hematuria   . Hemochromatosis   . Hypertension   . Lung nodule   . Mobitz type 2 second degree heart block    PPM MDT 10/28/15 Dr. Lovena Le  . NICM (nonischemic cardiomyopathy) ([place])   . Paroxysmal atrial flutter ([place])   . Pericardial effusion   . Persistent atrial fibrillation ([place])   . Presence of permanent cardiac pacemaker    [employer]  . Tobacco abuse     Past Surgical History:  Procedure Laterality Date  . BIOPSY  10/13/2016   Procedure: BIOPSY;  Surgeon: Rogene Houston, MD;  [facility];  Service: Endoscopy;;  esophageal  . CARDIAC CATHETERIZATION N/A 03/31/2016   Procedure: Left Heart Cath and Coronary Angiography;  Surgeon: Wellington Hampshire, MD;  Location: [facility];  Service: Cardiovascular;  Laterality: N/A;  . COLONOSCOPY N/A 10/13/2016   Procedure: COLONOSCOPY;  Surgeon: Rogene Houston, MD;  [facility];  Service: Endoscopy;  Laterality:  N/A;  . EP IMPLANTABLE DEVICE N/A 10/28/2015   Procedure: Pacemaker Implant;  Surgeon: Evans Lance, MD;  Location: [facility];  Service: Cardiovascular;  Laterality: N/A;  . EP IMPLANTABLE DEVICE N/A 11/07/2015   Procedure: Pocket Revision;  Surgeon: Evans Lance, MD;  Location: [facility];  Service: Cardiovascular;  Laterality: N/A;  . ESOPHAGOGASTRODUODENOSCOPY N/A 05/27/2016   Procedure: ESOPHAGOGASTRODUODENOSCOPY (EGD);  Surgeon: Rogene Houston, MD;  [facility];  Service: Endoscopy;  Laterality: N/A;  . ESOPHAGOGASTRODUODENOSCOPY N/A 10/13/2016    Procedure: ESOPHAGOGASTRODUODENOSCOPY (EGD);  Surgeon: Rogene Houston, MD;  [facility];  Service: Endoscopy;  Laterality: N/A;  1:30 - moved to 11/8 @ 1:00 - Ann notified pt  . FUDUCIAL PLACEMENT Right 12/29/2017   Procedure: PLACEMENT OF FUDUCIAL;  Surgeon: [name], MD;  Location: [facility];  Service: Thoracic;  Laterality: Right;  . POLYPECTOMY  10/13/2016   Procedure: POLYPECTOMY;  Surgeon: Rogene Houston, MD;  [facility];  Service: Endoscopy;;  transverse colon polypectomy  . TONSILLECTOMY    . VIDEO BRONCHOSCOPY WITH ENDOBRONCHIAL NAVIGATION Right 12/29/2017   Procedure: VIDEO BRONCHOSCOPY WITH ENDOBRONCHIAL NAVIGATION;  Surgeon: [name], MD;  Location: [facility];  Service: Thoracic;  Laterality: Right;     reports that he has been smoking e-cigarettes and cigarettes. He has a 14.00 pack-year smoking history. He has never used smokeless tobacco. He reports current alcohol use of about 3.0 standard drinks of alcohol per week. He reports that he does not use drugs.  Allergies  Allergen Reactions  . Xarelto [Rivaroxaban] Other (See Comments)    Caused bleeding.   . Eliquis [Apixaban]     Internal bleeding    Family History  Problem Relation Age of Onset  . Diabetes Other   . Dementia Mother   . Diabetes Maternal Grandmother     Prior to Admission medications   Medication Sig Start Date End Date Taking? Authorizing Provider  albuterol (PROVENTIL HFA;VENTOLIN HFA) 108 (90 Base) MCG/ACT inhaler Inhale 1-2 puffs into the lungs every 6 (six) hours as needed for wheezing or shortness of breath. 08/01/17   Fredia Sorrow, MD  alum & mag hydroxide-simeth (MAALOX/MYLANTA) 200-200-20 MG/5ML suspension Take 30 mLs by mouth every 4 (four) hours as needed for indigestion or heartburn. 11/07/19   Manuella Ghazi, Llewellyn Choplin D, DO  atorvastatin (LIPITOR) 40 MG tablet Take 40 mg by mouth every evening.     [provider]  chlordiazePOXIDE (LIBRIUM) 5 MG  capsule Take 3 capsules (15 mg total) by mouth 3 (three) times daily. 11/07/19   Manuella Ghazi, Nilton Lave D, DO  Fluticasone-Umeclidin-Vilant (TRELEGY ELLIPTA) 100-62.5-25 MCG/INH AEPB Inhale 1 puff into the lungs daily. 06/22/18   Tanda Rockers, MD  furosemide (LASIX) 20 MG tablet Take 1 tablet (20 mg total) by mouth daily. 11/07/19 12/07/19  Manuella Ghazi, Marrio Scribner D, DO  guaiFENesin (ROBITUSSIN) 100 MG/5ML SOLN Take 15 mLs (300 mg total) by mouth 3 (three) times daily. 11/07/19   Manuella Ghazi, Dawsyn Zurn D, DO  ipratropium-albuterol (DUONEB) 0.5-2.5 (3) MG/3ML SOLN Take 3 mLs by nebulization 3 (three) times daily for 5 days. 11/07/19 11/12/19  Manuella Ghazi, Marizol Borror D, DO  lactulose (CHRONULAC) 10 GM/15ML solution Take 30 mLs (20 g total) by mouth 2 (two) times daily. 11/07/19   Manuella Ghazi, Micky Overturf D, DO  OXYGEN Inhale 2 L into the lungs at bedtime. **2L with sleep and exertion if needed    [provider]  pantoprazole (PROTONIX) 40 MG  tablet Take 1 tablet (40 mg total) by mouth 2 (two) times daily. 11/07/19 12/07/19  Manuella Ghazi, Theseus Birnie D, DO  sodium chloride (OCEAN) 0.65 % SOLN nasal spray Place 1 spray into both nostrils as needed for congestion.    [provider]  spironolactone (ALDACTONE) 50 MG tablet Take 1 tablet (50 mg total) by mouth daily. 11/07/19 12/07/19  Heath Lark D, DO    Physical Exam: Vitals:   11/12/2019 1130 12/06/2019 1131 11/12/2019 1148 12/05/2019 1200  BP: 100/66  (!) 87/67 (!) 102/59  Pulse:   61 (!) 59  Resp:   16   Temp:  97.7 F (36.5 C)    TempSrc:  Oral    SpO2:   98% 100%    Constitutional: NAD, appears confused and cannot describe what is going on. Vitals:   11/26/2019 1130 11/27/2019 1131 11/23/2019 1148 11/30/2019 1200  BP: 100/66  (!) 87/67 (!) 102/59  Pulse:   61 (!) 59  Resp:   16   Temp:  97.7 F (36.5 C)    TempSrc:  Oral    SpO2:   98% 100%   Eyes: lids and conjunctivae normal ENMT: Mucous membranes are moist.  Neck: normal, supple Respiratory: clear to auscultation bilaterally. Normal respiratory  effort. No accessory muscle use.  Cardiovascular: Irregular, no murmurs. No extremity edema. Abdomen: no tenderness, mild to moderate distention, but soft. Bowel sounds positive.  Musculoskeletal:  No joint deformity upper and lower extremities.   Skin: no rashes, lesions, ulcers.  Appears pale. Psychiatric: Cannot be assessed given patient condition  Labs on Admission: I have personally reviewed following labs and imaging studies  CBC: Recent Labs  Lab 11/07/19 0516 11/06/2019 0421  WBC 22.3* 20.3*  NEUTROABS 21.0* 18.9*  HGB 9.9* 9.4*  HCT 33.0* 31.8*  MCV 97.9 100.0  PLT 138* 092   Basic Metabolic Panel: Recent Labs  Lab 11/28/2019 0421  NA 133*  K 4.9  CL 90*  CO2 31  GLUCOSE 133*  BUN 65*  CREATININE 2.00*  CALCIUM 7.4*   GFR: Estimated Creatinine Clearance: 38.2 mL/min (A) (by C-G formula based on SCr of 2 mg/dL (H)). Liver Function Tests: Recent Labs  Lab 11/18/2019 0421  AST 104*  ALT 39  ALKPHOS 132*  BILITOT 2.1*  PROT 6.3*  ALBUMIN 2.5*   No results for input(s): LIPASE, AMYLASE in the last 168 hours. Recent Labs  Lab 11/20/2019 0430  AMMONIA 61*   Coagulation Profile: No results for input(s): INR, PROTIME in the last 168 hours. Cardiac Enzymes: No results for input(s): CKTOTAL, CKMB, CKMBINDEX, TROPONINI in the last 168 hours. BNP (last 3 results) No results for input(s): PROBNP in the last 8760 hours. HbA1C: No results for input(s): HGBA1C in the last 72 hours. CBG: Recent Labs  Lab 11/06/19 1626  GLUCAP 127*   Lipid Profile: No results for input(s): CHOL, HDL, LDLCALC, TRIG, CHOLHDL, LDLDIRECT in the last 72 hours. Thyroid Function Tests: No results for input(s): TSH, T4TOTAL, FREET4, T3FREE, THYROIDAB in the last 72 hours. Anemia Panel: No results for input(s): VITAMINB12, FOLATE, FERRITIN, TIBC, IRON, RETICCTPCT in the last 72 hours. Urine analysis:    Component Value Date/Time   COLORURINE YELLOW 05/25/2016 2318   APPEARANCEUR  CLEAR 05/25/2016 2318   LABSPEC <1.005 (L) 05/25/2016 2318   PHURINE 5.5 05/25/2016 2318   GLUCOSEU NEGATIVE 05/25/2016 2318   HGBUR NEGATIVE 05/25/2016 2318   [place] NEGATIVE 05/25/2016 2318   [place] NEGATIVE 05/25/2016 2318   PROTEINUR NEGATIVE 05/25/2016 2318  NITRITE NEGATIVE 05/25/2016 2318   LEUKOCYTESUR NEGATIVE 05/25/2016 2318    Radiological Exams on Admission: Dg Chest Port 1 View  Result Date: 11/28/2019 CLINICAL DATA:  Shortness of breath. EXAM: PORTABLE CHEST 1 VIEW COMPARISON:  Radiograph 11/02/2019 FINDINGS: Left-sided pacemaker in place. Mild cardiomegaly with unchanged mediastinal contours. Aortic atherosclerosis. Vascular congestion without pulmonary edema. Left lung base opacity in left pleural effusion, not significantly changed. Patchy infrahilar opacities on the right. Fiducial markers in the right upper lobe spiculated lesion, unchanged. No pneumothorax. IMPRESSION: 1. No significant change since radiograph 11/02/2019. Left pleural effusion and associated atelectasis/airspace disease. 2. Mild cardiomegaly with stable vascular congestion. 3. Right upper lobe fiducial markers with underlying spiculated lesion. Aortic Atherosclerosis (ICD10-I70.0). Electronically Signed   By: Keith Rake M.D.   On: 11/11/2019 04:23   [place] Ekg Site Rite  Result Date: 11/07/2019 If Site Rite image not attached, placement could not be confirmed due to current cardiac rhythm.   EKG: Independently reviewed.  70 bpm with atrial fibrillation/flutter/paced.  Assessment/Plan Active Problems:   CHF exacerbation (HCC)    Acute on chronic hypoxemic respiratory failure likely secondary to acute diastolic heart failure decompensation -We will plan to keep on Lasix IV drip due to soft blood pressure readings -Monitor daily weights and I's and O's -Try to wean back to baseline nasal cannula -Breathing treatments as needed -Patient is negative on influenza and Covid testing  -Incentive spirometry  Hypotension -No need for further IV fluid at this time -PICC line and initiation of pressors as needed to maintain MAP greater than 65 -We will give albumin as noted below -Careful monitoring on Lasix drip and avoid spironolactone  Acute hepatic encephalopathy -Maintain on lactulose and give enemas as needed, if not tolerating p.o. -Monitor with neuro checks closely -Monitor repeat ammonia levels -Hold paracentesis for now given soft blood pressure readings and consider as blood pressures improve -We will plan to give some albumin for albumin 2.5 -Hold scheduled Librium for now  COPD with chronic hypoxemia -Does not appear to have acute exacerbation at the moment with no wheezing noted -No significant respiratory distress -Breathing treatments as needed for now for shortness of breath or wheezing  Paroxysmal atrial fibrillation/flutter with complete heart block and pacemaker -Continue telemetry monitoring with stable heart rate control noted  Alcoholic liver cirrhosis with recurrent ascites -Hold spironolactone -No paracentesis given hypotension -We will plan to give albumin -Check CMP as well as INR in a.m.  CKD stage IIIb-stable -Continue to monitor while on diuresis -Monitor intake and output  DNR on admission -Discussed with son on phone -Patient has overall poor prognosis given above findings and recurrent hospitalizations -Appreciate palliative care consultation -High risk for mortality and in-hospital death  DVT prophylaxis: Heparin Code Status: DNR after discussion with son on phone Family Communication: Discussed with girlfriend at bedside and son on phone Disposition Plan: Admit for treatment of hypoxemia, encephalopathy, and careful blood pressure monitoring Consults called: Palliative care Admission status: Inpatient, stepdown unit   Lakeena Downie Darleen Crocker DO [facility] Pager [phone]  If 7PM-7AM, please contact night-coverage  [email or web address] Password [facility]  11/12/2019, 12:48 PM

## 2019-11-13 NOTE — ED Notes (Signed)
Pt pulled IV out 

## 2019-11-13 NOTE — Progress Notes (Signed)
Pt unable to follow commands or swallow water at this time, MD Manuella Ghazi notified. Lactulose changed to rectal.

## 2019-11-13 NOTE — Progress Notes (Signed)
Pt unable to perform incentive spirometry

## 2019-11-13 NOTE — ED Notes (Signed)
Received report on pt, pt lying semi fowlers in bed, pale, no urine output noted, pt confused, lung sounds congested, pt will attempt to cough at times pt is able to produce sputum of yellow in color, family at bedside, hospitalist paged

## 2019-11-13 NOTE — Progress Notes (Signed)
Unable to doppler pedal pulses bilaterally, feet are cold and cap refill >3 seconds. Able to doppler popliteal pulses. MD Manuella Ghazi notified.

## 2019-11-13 NOTE — ED Notes (Signed)
POC COVID NEGATIVE

## 2019-11-14 DIAGNOSIS — Z515 Encounter for palliative care: Secondary | ICD-10-CM

## 2019-11-14 DIAGNOSIS — J9601 Acute respiratory failure with hypoxia: Secondary | ICD-10-CM

## 2019-11-14 DIAGNOSIS — N179 Acute kidney failure, unspecified: Secondary | ICD-10-CM

## 2019-11-14 DIAGNOSIS — G9340 Encephalopathy, unspecified: Secondary | ICD-10-CM

## 2019-11-14 DIAGNOSIS — K7460 Unspecified cirrhosis of liver: Secondary | ICD-10-CM

## 2019-11-14 DIAGNOSIS — K7682 Hepatic encephalopathy: Secondary | ICD-10-CM

## 2019-11-14 DIAGNOSIS — K7290 Hepatic failure, unspecified without coma: Secondary | ICD-10-CM

## 2019-11-14 DIAGNOSIS — K117 Disturbances of salivary secretion: Secondary | ICD-10-CM

## 2019-11-14 DIAGNOSIS — K7031 Alcoholic cirrhosis of liver with ascites: Secondary | ICD-10-CM

## 2019-11-14 DIAGNOSIS — Z66 Do not resuscitate: Secondary | ICD-10-CM | POA: Diagnosis present

## 2019-11-14 DIAGNOSIS — R188 Other ascites: Secondary | ICD-10-CM

## 2019-11-14 LAB — COMPREHENSIVE METABOLIC PANEL
ALT: 39 U/L (ref 0–44)
AST: 103 U/L — ABNORMAL HIGH (ref 15–41)
Albumin: 2.6 g/dL — ABNORMAL LOW (ref 3.5–5.0)
Alkaline Phosphatase: 121 U/L (ref 38–126)
Anion gap: 11 (ref 5–15)
BUN: 71 mg/dL — ABNORMAL HIGH (ref 8–23)
CO2: 32 mmol/L (ref 22–32)
Calcium: 7.4 mg/dL — ABNORMAL LOW (ref 8.9–10.3)
Chloride: 91 mmol/L — ABNORMAL LOW (ref 98–111)
Creatinine, Ser: 2.39 mg/dL — ABNORMAL HIGH (ref 0.61–1.24)
GFR calc Af Amer: 31 mL/min — ABNORMAL LOW (ref >60)
GFR calc non Af Amer: 27 mL/min — ABNORMAL LOW (ref >60)
Glucose, Bld: 94 mg/dL (ref 70–99)
Potassium: 4.7 mmol/L (ref 3.5–5.1)
Sodium: 134 mmol/L — ABNORMAL LOW (ref 135–145)
Total Bilirubin: 2.1 mg/dL — ABNORMAL HIGH (ref 0.3–1.2)
Total Protein: 6 g/dL — ABNORMAL LOW (ref 6.5–8.1)

## 2019-11-14 LAB — CBC
HCT: 29.7 % — ABNORMAL LOW (ref 39.0–52.0)
Hemoglobin: 8.6 g/dL — ABNORMAL LOW (ref 13.0–17.0)
MCH: 29.3 pg (ref 26.0–34.0)
MCHC: 29 g/dL — ABNORMAL LOW (ref 30.0–36.0)
MCV: 101 fL — ABNORMAL HIGH (ref 80.0–100.0)
Platelets: 184 10*3/uL (ref 150–400)
RBC: 2.94 MIL/uL — ABNORMAL LOW (ref 4.22–5.81)
RDW: 16.8 % — ABNORMAL HIGH (ref 11.5–15.5)
WBC: 22.2 10*3/uL — ABNORMAL HIGH (ref 4.0–10.5)
nRBC: 0.6 % — ABNORMAL HIGH (ref 0.0–0.2)

## 2019-11-14 LAB — MAGNESIUM: Magnesium: 1.5 mg/dL — ABNORMAL LOW (ref 1.7–2.4)

## 2019-11-14 LAB — AMMONIA: Ammonia: 68 umol/L — ABNORMAL HIGH (ref 9–35)

## 2019-11-14 LAB — LACTIC ACID, PLASMA: Lactic Acid, Venous: 0.9 mmol/L (ref 0.5–1.9)

## 2019-11-14 LAB — PROTIME-INR
INR: 1.2 (ref 0.8–1.2)
Prothrombin Time: 15.3 seconds — ABNORMAL HIGH (ref 11.4–15.2)

## 2019-11-14 MED ORDER — GLYCOPYRROLATE 0.2 MG/ML IJ SOLN
0.3000 mg | INTRAMUSCULAR | Status: DC | PRN
  Administered 2019-11-14: 0.3 mg via INTRAVENOUS
  Filled 2019-11-14: qty 2

## 2019-11-14 MED ORDER — NOREPINEPHRINE 4 MG/250ML-% IV SOLN
0.0000 ug/min | INTRAVENOUS | Status: DC
  Administered 2019-11-14: 13.013 ug/min via INTRAVENOUS
  Administered 2019-11-14: 11:00:00 2 ug/min via INTRAVENOUS
  Filled 2019-11-14 (×2): qty 250

## 2019-11-14 MED ORDER — MAGNESIUM SULFATE 2 GM/50ML IV SOLN
2.0000 g | Freq: Once | INTRAVENOUS | Status: AC
  Administered 2019-11-14: 2 g via INTRAVENOUS
  Filled 2019-11-14: qty 50

## 2019-11-14 MED ORDER — MORPHINE SULFATE (PF) 2 MG/ML IV SOLN
1.0000 mg | INTRAVENOUS | Status: DC | PRN
  Administered 2019-11-14 (×3): 1 mg via INTRAVENOUS
  Filled 2019-11-14 (×3): qty 1

## 2019-11-27 ENCOUNTER — Ambulatory Visit: Payer: Self-pay | Admitting: *Deleted

## 2019-12-07 NOTE — Progress Notes (Signed)
MD notified BP 97/48 and HR of 60. Pt now 96% on 10L HFNC with canula in mouth as pt is mouth breathing. Ordered to d/c Lasix drip at this time.

## 2019-12-07 NOTE — Progress Notes (Signed)
MD notified pt is on 7L [place] and 88%. No new orders at this time. Placed pt on 10L HFNC.

## 2019-12-07 NOTE — Progress Notes (Signed)
Son came out to nurses station and said he is ready to turn the levophed off. MD made aware.

## 2019-12-07 NOTE — Progress Notes (Signed)
PMT consult received and chart reviewed. Discussed with Dr. Wynetta Emery and RN. Upon arrival to room, patient appears to be actively dying with unresponsiveness, agonal/shallow respirations, and audible secretions. Patient does appear comfortable though. Started on levo this afternoon for hypotension. Discussed with significant other, Santiago Glad at bedside. Also discussed with son, Shaun May [phone]) via telephone. [name] understands his father's critical condition and poor prognosis of likely hours. [name] is driving now from [facility] and should be at the hospital around [place] wishes to continue levo with hopes he will see his father before he passes. [name] does not want his father to suffer and is agreeable with initiation of comfort medications. Anticipate shift to full comfort measures when son and DIL arrive from [facility].   Full palliative note to follow.   NO CHARGE  Ihor Dow, [place], FNP-C Palliative Medicine Team  Phone: [phone] Fax: [phone]

## 2019-12-07 NOTE — Progress Notes (Signed)
MD Wynetta Emery notified of BP 84/57 and HR of 60, orders to stop Lasix drip for three hours.

## 2019-12-07 NOTE — Progress Notes (Signed)
Present with Shaun May for emotional and spiritual support. She shared life history with Mr Beske [phone]) as I acknowledged her grief. She remained tearful as she discussed her hope for him, moving to a place of accepting that his comfort was her hope, as it looked like he would not recover from this. We prayed for peace through this journey also.  I shared the booklet, Gone From My Sight, when she asked about specifics of the dying process.

## 2019-12-07 NOTE — Death Summary Note (Signed)
DEATH SUMMARY   Patient Details  Name: Shaun May MRN: [id number] DOB: 1951-03-19  Admission/Discharge Information   Admit Date:  2019-12-05  Date of Death: Date of Death: 2019-12-06  Time of Death: Time of Death: 03-06-2221  Length of Stay: 2  Referring Physician: Celene Squibb, MD   Reason(s) for Hospitalization  Acute encephalopathy  Diagnoses  Preliminary cause of death:  Secondary Diagnoses (including complications and co-morbidities):  Active Problems:   COPD GOLD IV/ 02 dep at hs/ still smoking    Anemia   Hyponatremia   Chronic respiratory failure with hypoxia (HCC)/ nocturnal hypoxemia    CHF exacerbation (HCC)   Cirrhosis of liver with ascites (HCC)   ETOH abuse   Acute respiratory failure with hypoxia (HCC)   (HFpEF) heart failure with preserved ejection fraction/EF 70-62%/ Chronic Diastolic Dysfunction CHF   Thrombocytopenia (HCC)   Pressure ulcer   DNR (do not resuscitate)   Palliative care by specialist   Terminal care   AKI (acute kidney injury) ([place])   Hepatic encephalopathy ([place])   Increased oropharyngeal secretions   Brief Hospital Course (including significant findings, care, treatment, and services provided and events leading to death)  JOSIMAR CORNING is a 69 y.o. year old male who presented with worsening shortness of breath and hypoxemia. He had an elevated serum ammonia level consistent with hepatic encephalopathy.  He was hypotensive requiring central line and pressor support. He was DNR on admission but son was agreeable to short course of maximal medical therapies to see if he would respond to treatments and improve however he continued to decline despite maximal medical therapies.  Palliative medicine was consulted and after discussions with family patient was made full comfort care.  Please see hospital notes and records for full account of hospitalization.   Pertinent Labs and Studies  Significant Diagnostic Studies DG Chest 2 View  Result Date:  10/30/2019 CLINICAL DATA:  Lower extremity redness, swelling and increased fluid retention, more confusion evening, multiple falls this past weekend, laid on floor for hours, history smoking, pacemaker, atrial fibrillation, COPD, chronic kidney disease, hypertension, CHF, RIGHT upper lobe lung cancer EXAM: CHEST - 2 VIEW COMPARISON:  08/10/2019 Correlation: CT angio chest 05/17/2018 FINDINGS: LEFT subclavian sequential transvenous pacemaker leads project at RIGHT atrium and RIGHT ventricle. Normal heart size, mediastinal contours, and pulmonary vascularity. Atherosclerotic calcification aorta. Scarring in RIGHT upper lobe with associated stellate opacity, question post radiation therapy change; this has increased since the previous study. Atelectasis versus consolidation LEFT lower lobe. Remaining lungs clear. Small LEFT and tiny RIGHT pleural effusions. No pneumothorax or acute osseous findings. IMPRESSION: Atelectasis versus consolidation LEFT lower lobe with associated LEFT pleural effusion. Increased delayed opacity in the RIGHT upper lobe question progressive post radiation therapy changes. Tiny RIGHT pleural effusion. Aortic Atherosclerosis (ICD10-I70.0). Electronically Signed   By: Lavonia Dana M.D.   On: 10/30/2019 14:58   US Venous Img Lower Unilateral Left (DVT)  Result Date: 10/30/2019 CLINICAL DATA:  69 year old male with left lower extremity redness and swelling for 1 week EXAM: LEFT LOWER EXTREMITY VENOUS DOPPLER ULTRASOUND TECHNIQUE: Gray-scale sonography with graded compression, as well as color Doppler and duplex ultrasound were performed to evaluate the lower extremity deep venous systems from the level of the common femoral vein and including the common femoral, femoral, profunda femoral, popliteal and calf veins including the posterior tibial, peroneal and gastrocnemius veins when visible. The superficial great saphenous vein was also interrogated. Spectral Doppler was utilized to evaluate  flow  at rest and with distal augmentation maneuvers in the common femoral, femoral and popliteal veins. COMPARISON:  None. FINDINGS: Contralateral Common Femoral Vein: Respiratory phasicity is normal and symmetric with the symptomatic side. No evidence of thrombus. Normal compressibility. Common Femoral Vein: No evidence of thrombus. Normal compressibility, respiratory phasicity and response to augmentation. Saphenofemoral Junction: No evidence of thrombus. Normal compressibility and flow on color Doppler imaging. Profunda Femoral Vein: No evidence of thrombus. Normal compressibility and flow on color Doppler imaging. Femoral Vein: No evidence of thrombus. Normal compressibility, respiratory phasicity and response to augmentation. Popliteal Vein: No evidence of thrombus. Normal compressibility, respiratory phasicity and response to augmentation. Calf Veins: No evidence of thrombus. Normal compressibility and flow on color Doppler imaging. Superficial Great Saphenous Vein: No evidence of thrombus. Normal compressibility. Venous Reflux:  None. Other Findings:  None. IMPRESSION: No evidence of deep venous thrombosis. Electronically Signed   By: Jacqulynn Cadet M.D.   On: 10/30/2019 14:45   US Paracentesis  Result Date: 11/05/2019 INDICATION: Cirrhosis of liver with ascites EXAM: ULTRASOUND GUIDED THERAPEUTIC PARACENTESIS MEDICATIONS: None COMPLICATIONS: None immediate PROCEDURE: Informed written consent was obtained from the patient after a discussion of the risks, benefits and alternatives to treatment. A timeout was performed prior to the initiation of the procedure. Initial ultrasound scanning demonstrates a large amount of ascites within the right lower abdominal quadrant. The right lower abdomen was prepped and draped in the usual sterile fashion. 1% lidocaine was used for local anesthesia. Following this, a 5 [place] Yueh catheter was introduced. An ultrasound image was saved for documentation purposes. The  paracentesis was performed. The catheter was removed and a dressing was applied. The patient tolerated the procedure well without immediate post procedural complication. Patient received post-procedure intravenous albumin; see nursing notes for details. FINDINGS: A total of approximately 2.3 L of amber colored ascitic fluid was removed. IMPRESSION: Successful ultrasound-guided paracentesis yielding 2.3 liters of peritoneal fluid. Electronically Signed   By: Lavonia Dana M.D.   On: 11/05/2019 17:21   US Paracentesis  Result Date: 10/24/2019 INDICATION: Cirrhosis, ascites EXAM: ULTRASOUND GUIDED DIAGNOSTIC AND THERAPEUTIC PARACENTESIS MEDICATIONS: NONE COMPLICATIONS: NONE IMMEDIATE PROCEDURE: Informed written consent was obtained from the patient after a discussion of the risks, benefits and alternatives to treatment. A timeout was performed prior to the initiation of the procedure. Initial ultrasound scanning demonstrates a moderate amount of ascites within the LEFT lower abdominal quadrant. The right lower abdomen was prepped and draped in the usual sterile fashion. 1% lidocaine was used for local anesthesia. Following this, a 5 [place] Yueh catheter was introduced. An ultrasound image was saved for documentation purposes. The paracentesis was performed. The catheter was removed and a dressing was applied. The patient tolerated the procedure well without immediate post procedural complication. Patient received post-procedure intravenous albumin; see nursing notes for details. FINDINGS: A total of approximately 2.9 L of amber colored ascitic fluid was removed. Samples were sent to the laboratory as requested by the clinical team. IMPRESSION: Successful ultrasound-guided paracentesis yielding 2.9 liters of peritoneal fluid. Electronically Signed   By: Lavonia Dana M.D.   On: 10/24/2019 16:07   DG CHEST PORT 1 VIEW  Result Date: 12/02/2019 CLINICAL DATA:  PICC placement. EXAM: PORTABLE CHEST 1 VIEW COMPARISON:   Same day. FINDINGS: Stable cardiomediastinal silhouette. Left-sided pacemaker is unchanged in position. Interval placement of right-sided PICC line with distal tip in expected position of the SVC. Stable bilateral lung opacities are noted. Bony thorax is unremarkable. IMPRESSION: Interval placement of right-sided  PICC line with distal tip in expected position of the SVC. Stable bilateral lung opacities are noted. Electronically Signed   By: Marijo Conception M.D.   On: 11/08/2019 16:13   DG Chest Port 1 View  Result Date: 11/07/2019 CLINICAL DATA:  Shortness of breath. EXAM: PORTABLE CHEST 1 VIEW COMPARISON:  Radiograph 11/02/2019 FINDINGS: Left-sided pacemaker in place. Mild cardiomegaly with unchanged mediastinal contours. Aortic atherosclerosis. Vascular congestion without pulmonary edema. Left lung base opacity in left pleural effusion, not significantly changed. Patchy infrahilar opacities on the right. Fiducial markers in the right upper lobe spiculated lesion, unchanged. No pneumothorax. IMPRESSION: 1. No significant change since radiograph 11/02/2019. Left pleural effusion and associated atelectasis/airspace disease. 2. Mild cardiomegaly with stable vascular congestion. 3. Right upper lobe fiducial markers with underlying spiculated lesion. Aortic Atherosclerosis (ICD10-I70.0). Electronically Signed   By: Keith Rake M.D.   On: 11/28/2019 04:23   DG CHEST PORT 1 VIEW  Result Date: 11/02/2019 CLINICAL DATA:  Respiratory failure EXAM: PORTABLE CHEST 1 VIEW COMPARISON:  Radiograph 11/01/2019 FINDINGS: Cardiac pacer pack overlies the left chest wall. Pacer lead positioning is unchanged from prior. Surgical clips in the right upper lobe and a spiculated mass lesion in the right upper lung are similar prior accounting for differences in positioning. There is slightly increasing bilateral airspace disease most pronounced to the infrahilar lungs. Cardiomediastinal contours including a calcified aorta are  stable from prior. No pre or paravertebral fluid or swelling. No visible canal hematoma. IMPRESSION: 1. Slightly increasing bilateral airspace disease most pronounced in the infrahilar lungs. Possibly worsening edema or infection. 2. Stable postoperative changes and spiculated mass in the right upper lobe. 3. Prior CABG. 4.  Aortic Atherosclerosis (ICD10-I70.0). Electronically Signed   By: Lovena Le M.D.   On: 11/02/2019 07:02   DG CHEST PORT 1 VIEW  Result Date: 11/01/2019 CLINICAL DATA:  Hypoxia. EXAM: PORTABLE CHEST 1 VIEW COMPARISON:  10/30/2019 FINDINGS: Dual lead pacemaker unchanged. Heart size upper normal. Vascular congestion has progressed. Progressive left lower lobe airspace disease and small left effusion. No effusion on the right. Surgical clips in the right upper lobe with spiculated mass lesion unchanged from prior study. IMPRESSION: Progression of vascular congestion. Progressive left lower lobe airspace disease and left effusion. Findings suggestive of fluid overload however pneumonia in the left lower lobe is possible. Electronically Signed   By: Franchot Gallo M.D.   On: 11/01/2019 06:50   [place] EKG SITE RITE  Result Date: 11/10/2019 If Site Rite image not attached, placement could not be confirmed due to current cardiac rhythm.   Microbiology Recent Results (from the past 240 hour(s))  Respiratory Panel by RT PCR (Flu A&B, Covid) - Nasopharyngeal Swab     Status: None   Collection Time: 11/21/2019  4:30 AM   Specimen: Nasopharyngeal Swab  Result Value Ref Range Status   SARS Coronavirus 2 by RT PCR NEGATIVE NEGATIVE Final    Comment: (NOTE) SARS-CoV-2 target nucleic acids are NOT DETECTED. The SARS-CoV-2 RNA is generally detectable in upper respiratoy specimens during the acute phase of infection. The lowest concentration of SARS-CoV-2 viral copies this assay can detect is 131 copies/mL. A negative result does not preclude SARS-Cov-2 infection and should not be used as the  sole basis for treatment or other patient management decisions. A negative result may occur with  improper specimen collection/handling, submission of specimen other than nasopharyngeal swab, presence of viral mutation(s) within the areas targeted by this assay, and inadequate number of viral copies (<  131 copies/mL). A negative result must be combined with clinical observations, patient history, and epidemiological information. The expected result is Negative. Fact Sheet for Patients:  PinkCheek.be Fact Sheet for Healthcare Providers:  GravelBags.it This test is not yet ap proved or cleared by the [place] FDA and  has been authorized for detection and/or diagnosis of SARS-CoV-2 by FDA under an Emergency Use Authorization (EUA). This EUA will remain  in effect (meaning this test can be used) for the duration of the COVID-19 declaration under Section 564(b)(1) of the Act, 21 U.S.C. section 360bbb-3(b)(1), unless the authorization is terminated or revoked sooner.    Influenza A by PCR NEGATIVE NEGATIVE Final   Influenza B by PCR NEGATIVE NEGATIVE Final    Comment: (NOTE) The Xpert Xpress SARS-CoV-2/FLU/RSV assay is intended as an aid in  the diagnosis of influenza from Nasopharyngeal swab specimens and  should not be used as a sole basis for treatment. Nasal washings and  aspirates are unacceptable for Xpert Xpress SARS-CoV-2/FLU/RSV  testing. Fact Sheet for Patients: PinkCheek.be Fact Sheet for Healthcare Providers: GravelBags.it This test is not yet approved or cleared by the [place] FDA and  has been authorized for detection and/or diagnosis of SARS-CoV-2 by  FDA under an Emergency Use Authorization (EUA). This EUA will remain  in effect (meaning this test can be used) for the duration of the  Covid-19 declaration under Section 564(b)(1) of the Act, 21   U.S.C. section 360bbb-3(b)(1), unless the authorization is  terminated or revoked. Performed at [facility], [address], [place], [place] [address]     Lab Basic Metabolic Panel: Recent Labs  Lab 11/15/2019 0421 21-Nov-2019 0416  NA 133* 134*  K 4.9 4.7  CL 90* 91*  CO2 31 32  GLUCOSE 133* 94  BUN 65* 71*  CREATININE 2.00* 2.39*  CALCIUM 7.4* 7.4*  MG  --  1.5*   Liver Function Tests: Recent Labs  Lab 11/23/2019 0421 11-21-2019 0416  AST 104* 103*  ALT 39 39  ALKPHOS 132* 121  BILITOT 2.1* 2.1*  PROT 6.3* 6.0*  ALBUMIN 2.5* 2.6*   No results for input(s): LIPASE, AMYLASE in the last 168 hours. Recent Labs  Lab 11/18/2019 0430 21-Nov-2019 0416  AMMONIA 61* 68*   CBC: Recent Labs  Lab 11/12/2019 0421 11/21/19 0416  WBC 20.3* 22.2*  NEUTROABS 18.9*  --   HGB 9.4* 8.6*  HCT 31.8* 29.7*  MCV 100.0 101.0*  PLT 184 184   Cardiac Enzymes: No results for input(s): CKTOTAL, CKMB, CKMBINDEX, TROPONINI in the last 168 hours. Sepsis Labs: Recent Labs  Lab 12/02/2019 0421 11/28/2019 0430 11-21-19 0416 21-Nov-2019 1047  WBC 20.3*  --  22.2*  --   LATICACIDVEN  --  1.8  --  0.9    Procedures/Operations  PICC line placement 11/25/2019      12/03/2019, 6:55 AM

## 2019-12-07 NOTE — Consult Note (Signed)
Consultation Note Date: 12-02-2019   Patient Name: Shaun May  DOB: 10-12-1951  MRN: [id number]  Age / Sex: 69 y.o., male  PCP: Celene Squibb, MD Referring Physician: Murlean Iba, MD  Reason for Consultation: Establishing goals of care  HPI/Patient Profile: 69 y.o. male  with past medical history of COPD with chronic hypoxemia, paroxysmal atrial fibrillation/flutter not on anticoagulation due to recurrent GI bleeding, complete heart block s/p pacemaker, chronic venous insufficiency, CKD stage IIIb, heart failure preserved EF 60-65%, ETOH liver cirrhosis requiring frequent paracentesis, and recent discharge to [facility] 12/2 following admission for sepsis and left left cellulitis/wound infection admitted on 11/08/2019 with shortness of breath and oxygen saturations 83% on 4L [place]. In ED, CXR reveals cardiomegaly and venous congestion. Ammonia 61 with BNP 333. Hospital admission for acute on chronic respiratory failure secondary to acute diastolic heart failure with acute hepatic encephalopathy. Son made patient DNR on admission but agreeable to central line placement for vasopressors as needed. Palliative medicine consultation for goals of care. Patient with poor prognosis.   Clinical Assessment and Goals of Care:  I have reviewed medical records, discussed with Dr. Wynetta Emery and RN, and met with significant other Shaun May) at bedside.   Patient appears to be actively dying with agonal respirations. He is unresponsive but appears comfortable without facial grimacing or increased work of breathing. Audible secretions. Hypotension, levo recently started.   I introduced Palliative Medicine as specialized medical care for people living with serious illness. It focuses on providing relief from the symptoms and stress of a serious illness.   Shaun May is appropriately tearful. Her and Shaun May (goes by Shaun May) have been together for  about 40 years. Prior to last admission, patient was living home. Shaun May shares that he has been resistant to follow-up with outpatient doctors for his underlying chronic conditions. Shaun May has one son, Shaun May (also goes by Shaun May) who lives in [place].   Discussed course of hospitalization including diagnoses, interventions, and plan of care. Frankly and compassionately expressed to Shaun May that Shaun May is in multiorgan failure and appears to be actively dying. Explained that levo infusion is essentially what is keeping him alive. Explained challenges with managing fluid with lasix gtt and paracentesis, with low blood pressure.   Spoke with patient's son, Shaun May and DIL Shaun May) via telephone while at bedside. Shaun May spoke with Dr. Wynetta Emery this morning and understands his father's critical condition and poor prognosis. Shaun May and Shaun May are en route from [facility] and should arrive around [place] confirms decision for DNR/DNI code status, understanding this would be futile with his father's underlying multiorgan failure due to multiple chronic conditions. Shaun May wishes to continue levo infusion, with hopes he will make it to the hospital before his father passes. Shaun May does acknowledge that he does not want his father to suffer or prolong suffering, therefore if he becomes more uncomfortable this afternoon, Shaun May requests we call and discontinue levo if necessary. Shaun May does agree to start medications as needed for comfort.   Shaun May would  appreciate visit from chaplain if available. Emotional/spiritual support provided. Therapeutic listening. Questions and concerns were addressed. PMT contact information given.    SUMMARY OF RECOMMENDATIONS    DNR/DNI, no escalation of care  Continue levo infusion. Son on the way from [facility] and prefer we continue levo with hopes he will arrive before his father passes.   Son agreeable to start prn comfort medications to ensure patient does not suffer if he shows signs  of discomfort. Updated RN.  Spiritual care consult for EOL care.   Anticipate transition to full comfort measures when family arrives and when ready. Anticipate hospital death.   Consider de-activation of pacemaker if prolonging EOL (patient frequently paced).   Code Status/Advance Care Planning:  DNR  Symptom Management:   Morphine 52m IV q2h prn pain/dyspnea/air hunger/tachypnea  Robinul 0.261mIV q4h prn secretions  Palliative Prophylaxis:   Aspiration, Delirium Protocol, Oral Care and Turn Reposition  Psycho-social/Spiritual:   Desire for further Chaplaincy support: yes  Additional Recommendations: Caregiving  Support/Resources, Compassionate Wean Education and Education on Hospice  Prognosis:   Poor prognosis  Discharge Planning: Anticipated Hospital Death      Primary Diagnoses: Present on Admission: . Pressure ulcer   I have reviewed the medical record, interviewed the patient and family, and examined the patient. The following aspects are pertinent.  Past Medical History:  Diagnosis Date  . Alcohol use   . Anemia    when on Xarelto  . [place]  . Chronic combined systolic and diastolic CHF (congestive heart failure) (HCC)    a. EF 35-40% by echo in 03/2016 with cath showing nonobstructive CAD b. EF improved to 60-65% by echo in 05/2018)  . CKD (chronic kidney disease), stage II   . COPD (chronic obstructive pulmonary disease) (HCC)    stage 4 COPD  . Dyspnea   . Gastric ulcer   . GI bleeding   . Hematuria   . Hemochromatosis   . Hypertension   . Lung nodule   . Mobitz type 2 second degree heart block    PPM MDT 10/28/15 Dr. TaLovena Le. N[place] ([place]ischemic cardiomyopathy) ([facility]  . Paroxysmal atrial flutter ([facility]  . Pericardial effusion   . Persistent atrial fibrillation ([facility]  . Presence of permanent cardiac pacemaker    [employer]  . Tobacco abuse    Social History   Socioeconomic History  . Marital status: Divorced    Spouse name:  Not on file  . Number of children: Not on file  . Years of education: Not on file  . Highest education level: Not on file  Occupational History  . Not on file  Social Needs  . Financial resource strain: Not on file  . Food insecurity    Worry: Not on file    Inability: Not on file  . Transportation needs    Medical: Not on file    Non-medical: Not on file  Tobacco Use  . Smoking status: Current Every Day Smoker    Packs/day: 0.25    Years: 56.00    Pack years: 14.00    Types: E-cigarettes, Cigarettes  . Smokeless tobacco: Never Used  Substance and Sexual Activity  . Alcohol use: Yes    Alcohol/week: 3.0 standard drinks    Types: 3 Shots of liquor per week    Comment: nightly  . Drug use: Never  . Sexual activity: Yes    Birth control/protection: None  Lifestyle  . Physical activity  Days per week: Not on file    Minutes per session: Not on file  . Stress: Not on file  Relationships  . Social Herbalist on phone: Not on file    Gets together: Not on file    Attends religious service: Not on file    Active member of club or organization: Not on file    Attends meetings of clubs or organizations: Not on file    Relationship status: Not on file  Other Topics Concern  . Not on file  Social History Narrative  . Not on file   Family History  Problem Relation Age of Onset  . Diabetes Other   . Dementia Mother   . Diabetes Maternal Grandmother    Scheduled Meds: . budesonide (PULMICORT) nebulizer solution  0.25 mg Nebulization BID  . Chlorhexidine Gluconate Cloth  6 each Topical Daily  . heparin  5,000 Units Subcutaneous Q8H  . lactulose  20 g Oral BID  . lactulose  300 mL Rectal Daily  . pantoprazole  40 mg Oral BID  . sodium chloride flush  10-40 mL Intracatheter Q12H  . sodium chloride flush  3 mL Intravenous Q12H  . umeclidinium-vilanterol  1 puff Inhalation Daily   Continuous Infusions: . sodium chloride 250 mL (2019-11-30 0728)  . furosemide  (LASIX) infusion 4 mg/hr (11/22/2019 1813)   PRN Meds:.sodium chloride, acetaminophen **OR** acetaminophen, ipratropium-albuterol, ondansetron **OR** ondansetron (ZOFRAN) IV, sodium chloride, sodium chloride flush, sodium chloride flush Medications Prior to Admission:  Prior to Admission medications   Medication Sig Start Date End Date Taking? Authorizing Provider  albuterol (PROVENTIL HFA;VENTOLIN HFA) 108 (90 Base) MCG/ACT inhaler Inhale 1-2 puffs into the lungs every 6 (six) hours as needed for wheezing or shortness of breath. 08/01/17  Yes Fredia Sorrow, MD  alum & mag hydroxide-simeth (MAALOX/MYLANTA) 200-200-20 MG/5ML suspension Take 30 mLs by mouth every 4 (four) hours as needed for indigestion or heartburn. 11/07/19  Yes Shah, Pratik D, DO  chlordiazePOXIDE (LIBRIUM) 5 MG capsule Take 3 capsules (15 mg total) by mouth 3 (three) times daily. 11/07/19  Yes Shah, Pratik D, DO  Fluticasone-Salmeterol (ADVAIR) 250-50 MCG/DOSE AEPB Inhale 1 puff into the lungs 2 (two) times daily.   Yes [provider]  Fluticasone-Umeclidin-Vilant (TRELEGY ELLIPTA) 100-62.5-25 MCG/INH AEPB Inhale 1 puff into the lungs daily. 06/22/18  Yes Tanda Rockers, MD  furosemide (LASIX) 20 MG tablet Take 1 tablet (20 mg total) by mouth daily. 11/07/19 12/07/19 Yes Shah, Pratik D, DO  guaiFENesin (ROBITUSSIN) 100 MG/5ML SOLN Take 15 mLs (300 mg total) by mouth 3 (three) times daily. 11/07/19  Yes Shah, Pratik D, DO  lactulose (CHRONULAC) 10 GM/15ML solution Take 30 mLs (20 g total) by mouth 2 (two) times daily. 11/07/19  Yes Shah, Pratik D, DO  omeprazole (PRILOSEC) 20 MG capsule Take 20 mg by mouth daily.   Yes [provider]  OXYGEN Inhale 2 L into the lungs at bedtime. **2L with sleep and exertion if needed   Yes [provider]  rosuvastatin (CRESTOR) 20 MG tablet Take 20 mg by mouth daily.   Yes [provider]  sodium chloride (OCEAN) 0.65 % SOLN nasal spray Place 1 spray into both  nostrils as needed for congestion.   Yes [provider]  spironolactone (ALDACTONE) 50 MG tablet Take 1 tablet (50 mg total) by mouth daily. 11/07/19 12/07/19 Yes Shah, Pratik D, DO  tiotropium (SPIRIVA) 18 MCG inhalation capsule Place 18 mcg into inhaler  and inhale daily.   Yes [provider]  ipratropium-albuterol (DUONEB) 0.5-2.5 (3) MG/3ML SOLN Take 3 mLs by nebulization 3 (three) times daily for 5 days. Patient not taking: Reported on 11/26/2019 11/07/19 11/12/19  Heath Lark D, DO  predniSONE (DELTASONE) 20 MG tablet Take 40 mg by mouth daily with breakfast.    [provider]   Allergies  Allergen Reactions  . Xarelto [Rivaroxaban] Other (See Comments)    Caused bleeding.   . Eliquis [Apixaban]     Internal bleeding   Review of Systems  Unable to perform ROS: Acuity of condition   Physical Exam Vitals signs and nursing note reviewed.  Constitutional:      Appearance: He is cachectic. He is ill-appearing.     Comments: unresponsive  Cardiovascular:     Rate and Rhythm: Regular rhythm.     Comments: Intermittently paced Pulmonary:     Effort: No tachypnea, accessory muscle usage or respiratory distress.     Breath sounds: Rhonchi present.     Comments: Audible secretions--RN to give robinul Abdominal:     General: There is distension.     Tenderness: There is no abdominal tenderness.     Comments: ascites  Skin:    Coloration: Skin is cyanotic and mottled.     Findings: Ecchymosis present.  Neurological:     Mental Status: He is unresponsive.     Vital Signs: BP (!) 94/55   Pulse (!) 59   Temp (!) 97.3 F (36.3 C) (Axillary)   Resp (!) 25   Ht 6' 2"  (1.88 m)   Wt 84.5 kg   SpO2 95%   BMI 23.92 kg/m  Pain Scale: PAINAD   Pain Score: 0-No pain   SpO2: SpO2: 95 % O2 Device:SpO2: 95 % O2 Flow Rate: .O2 Flow Rate (L/min): 4 L/min  IO: Intake/output summary:   Intake/Output Summary (Last 24 hours) at 2019/12/11 0911 Last data filed  at 11-Dec-2019 0200 Gross per 24 hour  Intake 9.39 ml  Output 800 ml  Net -790.61 ml    LBM: Last BM Date: 11-Dec-2019 Baseline Weight: Weight: 84.1 kg Most recent weight: Weight: 84.5 kg     Palliative Assessment/Data: PPS 10%     Time In: 1220 Time Out: 1320 Time Total: 7mn Greater than 50%  of this time was spent counseling and coordinating care related to the above assessment and plan.  Signed by:  MIhor Dow DNP, FNP-C Palliative Medicine Team  Phone: [phone]: [phone]  Please contact Palliative Medicine Team phone at [phone] questions and concerns.  For individual provider: See AShea Evans

## 2019-12-07 NOTE — Progress Notes (Signed)
Pt asystole on monitor at Feb 14, 2219. No respirations. No pulse palpated. No heart beat or sounds auscultated. No chest rise. S.Wade, RN in to verify. Family at bedside, and told the pt had passed. Time of death noted to be 2221/02/13. [facility] called. Pt is acceptable for eye donation only. Eye prep complete. Patient placement notified. AC called and notified of need for death certificate. MD paged to sign death certificate. Belongings in pt room given to family that is present, which is son the sons wife, and the pt girlfriend. Offered to call chaplain on call. Refreshments given to family.

## 2019-12-07 NOTE — Consult Note (Signed)
Referring Provider: Dr. Irwin Brakeman Primary Care Physician:  Celene Squibb, MD Primary Gastroenterologist:  Dr. Laural Golden   Reason for Consultation:  Cirrhosis    HPI: Shaun May is a 69 y.o. 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 His initial paracentesis was 08/10/2019 and 1.7 L of peritoneal fluid was removed at that time.  No evidence of SBP.  Cytology was negative for malignant cells.  SAAG greater than 1.1 with total protein greater than 2.5 indicated ascites most likely from right heart failure and less likely from cirrhosis.  An ECHO was done 09/21/2019 which showed LV EF 60-65%. RV enlarged with reduced systolic function.  A second paracentesis was done 10/24/2019, 2.9 L of fluid was removed.  A third paracentesis was done 11/05/2019 and 2.3 L of peritoneal fluid was removed.   Labs 08/10/2019: Hepatitis B surface antigen nonreactive.  Hepatitis B surface antibody nonreactive.  Hepatitis B core total antibody nonreactive.  Hepatitis C antibody nonreactive.  Hepatitis A antibody total reactive.  ANA negative.  Mitochondrial antibody <  20.  Smooth muscle antibody greater than 20.  Alpha-1 antitrypsin 111.  GGT 44.  Iron 103.  Ferritin 130.  Ceruloplasmin 33.  He was admitted to [facility] 11/24 with sepsis secondary to left lower leg cellulitis respiratory failure COPD, swollen pneumonia and CHF.  MRSA was negative.  He received IV antibiotics and steroids and his symptoms stabilized.  He was discharged to a SNF on 11/07/2019.   He presented back to the emergency room 12/02/2019 with acute shortness of breath, acute respiratory failure secondary to COPD and CHF.  He received Furosemide 4 mg/hour infusion and albumin 50  g IV.  He was unable to take p.o. Lactulose therefore he was given a lactulose enema x2.  He is now a DNR.  ED course: Sodium 133.  Potassium 4.9.  Glucose 133.  BUN 65.  Creatinine 2.0.  Anion gap 7.4.  Magnesium 1.5.  Alk phos 132.  Albumin 2.5.  AST 104.  ALT 39.  Total protein 6.3.  Ammonia 61.  Total bili 2.1.  BNP 333. WBC 20.3.  Hemoglobin 9.4.  Hematocrit 31.8.  MCV 100.0.  Platelet 184.  Lactic acid 1.8. 12/09: PT 15.3.  INR 1.2. COVID 19 negative.   Abdominal imaging has not been done  Chest x-ray: Interval placement of right-sided PICC line with distal tip in expected position of the SVC. Stable bilateral lung opacities are noted  ECHO 09/21/2019:  1. Left ventricular ejection fraction, by visual estimation, is 60 to 65%. The left ventricle has normal function. Normal left ventricular size. There is mildly increased left ventricular hypertrophy. 2. Left ventricular diastolic Doppler parameters are indeterminate pattern of LV diastolic filling.  An abdominal/pelvic CT 08/29/2019: 1. Nodular liver with steatosis and enlarged lateral segment left liver and caudate lobe, features suggesting cirrhosis. Portal vein and superior mesenteric vein are patent. No splenomegaly. 2. 15 mm hypoattenuating lesion identified in segment IV of the liver. This is stable in size compared to the study from 5 years ago suggesting benign etiology although the lesion showed indeterminate peripheral enhancement at that time. 3. Moderate volume ascites with diffuse body wall edema. 4. Small bilateral pleural effusions 5.  Aortic Atherosclerois   Past Medical History:  Diagnosis  Date  . Alcohol use   . Anemia    when on Xarelto  . Cancer ([place])   . Chronic combined systolic and diastolic CHF (congestive heart failure) (HCC)    a. EF 35-40% by echo in 03/2016 with cath showing nonobstructive CAD b. EF improved to 60-65% by echo in 05/2018)  . CKD (chronic kidney disease), stage II   . COPD  (chronic obstructive pulmonary disease) (HCC)    stage 4 COPD  . Dyspnea   . Gastric ulcer   . GI bleeding   . Hematuria   . Hemochromatosis   . Hypertension   . Lung nodule   . Mobitz type 2 second degree heart block    PPM MDT 10/28/15 Dr. Lovena Le  . NICM (nonischemic cardiomyopathy) ([place])   . Paroxysmal atrial flutter ([place])   . Pericardial effusion   . Persistent atrial fibrillation ([place])   . Presence of permanent cardiac pacemaker    [employer]  . Tobacco abuse     Past Surgical History:  Procedure Laterality Date  . BIOPSY  10/13/2016   Procedure: BIOPSY;  Surgeon: Rogene Houston, MD;  [facility];  Service: Endoscopy;;  esophageal  . CARDIAC CATHETERIZATION N/A 03/31/2016   Procedure: Left Heart Cath and Coronary Angiography;  Surgeon: Wellington Hampshire, MD;  Location: [place] [facility];  Service: Cardiovascular;  Laterality: N/A;  . COLONOSCOPY N/A 10/13/2016   Procedure: COLONOSCOPY;  Surgeon: Rogene Houston, MD;  [facility];  Service: Endoscopy;  Laterality: N/A;  . EP IMPLANTABLE DEVICE N/A 10/28/2015   Procedure: Pacemaker Implant;  Surgeon: Evans Lance, MD;  Location: [place] [facility];  Service: Cardiovascular;  Laterality: N/A;  . EP IMPLANTABLE DEVICE N/A 11/07/2015   Procedure: Pocket Revision;  Surgeon: Evans Lance, MD;  Location: [place] [facility];  Service: Cardiovascular;  Laterality: N/A;  . ESOPHAGOGASTRODUODENOSCOPY N/A 05/27/2016   Procedure: ESOPHAGOGASTRODUODENOSCOPY (EGD);  Surgeon: Rogene Houston, MD;  [facility];  Service: Endoscopy;  Laterality: N/A;  . ESOPHAGOGASTRODUODENOSCOPY N/A 10/13/2016   Procedure: ESOPHAGOGASTRODUODENOSCOPY (EGD);  Surgeon: Rogene Houston, MD;  [facility];  Service: Endoscopy;  Laterality: N/A;  1:30 - moved to 11/8 @ 1:00 - Ann notified pt  . FUDUCIAL PLACEMENT Right 12/29/2017   Procedure: PLACEMENT OF FUDUCIAL;  Surgeon: [name], MD;  Location: [place];   Service: Thoracic;  Laterality: Right;  . POLYPECTOMY  10/13/2016   Procedure: POLYPECTOMY;  Surgeon: Rogene Houston, MD;  [facility];  Service: Endoscopy;;  transverse colon polypectomy  . TONSILLECTOMY    . VIDEO BRONCHOSCOPY WITH ENDOBRONCHIAL NAVIGATION Right 12/29/2017   Procedure: VIDEO BRONCHOSCOPY WITH ENDOBRONCHIAL NAVIGATION;  Surgeon: [name], MD;  Location: [place];  Service: Thoracic;  Laterality: Right;    Prior to Admission medications   Medication Sig Start Date End Date Taking? Authorizing Provider  albuterol (PROVENTIL HFA;VENTOLIN HFA) 108 (90 Base) MCG/ACT inhaler Inhale 1-2 puffs into the lungs every 6 (six) hours as needed for wheezing or shortness of breath. 08/01/17  Yes Fredia Sorrow, MD  alum & mag hydroxide-simeth (MAALOX/MYLANTA) 200-200-20 MG/5ML suspension Take 30 mLs by mouth every 4 (four) hours as needed for indigestion or heartburn. 11/07/19  Yes Shah, Pratik D, DO  chlordiazePOXIDE (LIBRIUM) 5 MG capsule Take 3 capsules (15 mg total) by mouth 3 (three) times daily. 11/07/19  Yes Shah, Pratik D, DO  Fluticasone-Salmeterol (ADVAIR) 250-50 MCG/DOSE AEPB Inhale 1  puff into the lungs 2 (two) times daily.   Yes [provider]  Fluticasone-Umeclidin-Vilant (TRELEGY ELLIPTA) 100-62.5-25 MCG/INH AEPB Inhale 1 puff into the lungs daily. 06/22/18  Yes Tanda Rockers, MD  furosemide (LASIX) 20 MG tablet Take 1 tablet (20 mg total) by mouth daily. 11/07/19 12/07/19 Yes Shah, Pratik D, DO  guaiFENesin (ROBITUSSIN) 100 MG/5ML SOLN Take 15 mLs (300 mg total) by mouth 3 (three) times daily. 11/07/19  Yes Shah, Pratik D, DO  lactulose (CHRONULAC) 10 GM/15ML solution Take 30 mLs (20 g total) by mouth 2 (two) times daily. 11/07/19  Yes Shah, Pratik D, DO  omeprazole (PRILOSEC) 20 MG capsule Take 20 mg by mouth daily.   Yes [provider]  OXYGEN Inhale 2 L into the lungs at bedtime. **2L with sleep and exertion if needed   Yes [provider]  rosuvastatin (CRESTOR) 20 MG tablet Take 20 mg by mouth daily.   Yes [provider]  sodium chloride (OCEAN) 0.65 % SOLN nasal spray Place 1 spray into both nostrils as needed for congestion.   Yes [provider]  spironolactone (ALDACTONE) 50 MG tablet Take 1 tablet (50 mg total) by mouth daily. 11/07/19 12/07/19 Yes Shah, Pratik D, DO  tiotropium (SPIRIVA) 18 MCG inhalation capsule Place 18 mcg into inhaler and inhale daily.   Yes [provider]  ipratropium-albuterol (DUONEB) 0.5-2.5 (3) MG/3ML SOLN Take 3 mLs by nebulization 3 (three) times daily for 5 days. Patient not taking: Reported on 11/12/2019 11/07/19 11/12/19  Heath Lark D, DO  predniSONE (DELTASONE) 20 MG tablet Take 40 mg by mouth daily with breakfast.    [provider]    Current Facility-Administered Medications  Medication Dose Route Frequency Provider Last Rate Last Dose  . 0.9 %  sodium chloride infusion  250 mL Intravenous PRN Heath Lark D, DO 10 mL/hr at 19-Nov-2019 0728 250 mL at November 19, 2019 0728  . acetaminophen (TYLENOL) tablet 650 mg  650 mg Oral Q6H PRN Heath Lark D, DO   650 mg at 11/19/2019 2144   Or  . acetaminophen (TYLENOL) suppository 650 mg  650 mg Rectal Q6H PRN Manuella Ghazi, Pratik D, DO      . budesonide (PULMICORT) nebulizer solution 0.25 mg  0.25 mg Nebulization BID Lynetta Mare, MD   0.25 mg at 12/05/2019 2051  . Chlorhexidine Gluconate Cloth 2 % PADS 6 each  6 each Topical Daily Lynetta Mare, MD   6 each at 11/29/2019 1555  . furosemide (LASIX) 250 mg in dextrose 5 % 250 mL (1 mg/mL) infusion  4 mg/hr Intravenous Continuous Manuella Ghazi, Pratik D, DO 4 mL/hr at 11/20/2019 1813 4 mg/hr at 11/27/2019 1813  . heparin injection 5,000 Units  5,000 Units Subcutaneous Q8H Shah, Pratik D, DO   5,000 Units at 19-Nov-2019 0507  . ipratropium-albuterol (DUONEB) 0.5-2.5 (3) MG/3ML nebulizer solution 3 mL  3 mL Nebulization Q6H PRN Manuella Ghazi, Pratik D, DO      . lactulose (CHRONULAC) 10  GM/15ML solution 20 g  20 g Oral BID Manuella Ghazi, Pratik D, DO   20 g at 11/26/2019 2144  . lactulose (CHRONULAC) enema 200 gm  300 mL Rectal Daily Manuella Ghazi, Pratik D, DO   300 mL at 11/20/2019 1555  . magnesium sulfate IVPB 2 g 50 mL  2 g Intravenous Once Johnson, Clanford L, MD 50 mL/hr at 2019-11-19 0730 2 g at Nov 19, 2019 0730  . ondansetron (ZOFRAN) tablet 4 mg  4 mg Oral Q6H PRN Manuella Ghazi,  Pratik D, DO       Or  . ondansetron (ZOFRAN) injection 4 mg  4 mg Intravenous Q6H PRN Manuella Ghazi, Pratik D, DO      . pantoprazole (PROTONIX) EC tablet 40 mg  40 mg Oral BID Heath Lark D, DO   40 mg at 11/22/2019 2144  . sodium chloride (OCEAN) 0.65 % nasal spray 1 spray  1 spray Each Nare PRN Manuella Ghazi, Pratik D, DO      . sodium chloride flush (NS) 0.9 % injection 10-40 mL  10-40 mL Intracatheter Q12H Lynetta Mare, MD   10 mL at 12/03/2019 2144  . sodium chloride flush (NS) 0.9 % injection 10-40 mL  10-40 mL Intracatheter PRN Lynetta Mare, MD      . sodium chloride flush (NS) 0.9 % injection 3 mL  3 mL Intravenous Q12H Shah, Pratik D, DO   3 mL at 11/19/2019 2144  . sodium chloride flush (NS) 0.9 % injection 3 mL  3 mL Intravenous PRN Manuella Ghazi, Pratik D, DO      . umeclidinium-vilanterol (ANORO ELLIPTA) 62.5-25 MCG/INH 1 puff  1 puff Inhalation Daily Lynetta Mare, MD        Allergies as of 12/04/2019 - Review Complete 12/04/2019  Allergen Reaction Noted  . Xarelto [rivaroxaban] Other (See Comments) 01/31/2017  . Eliquis [apixaban]  06/22/2018    Family History  Problem Relation Age of Onset  . Diabetes Other   . Dementia Mother   . Diabetes Maternal Grandmother     Social History   Socioeconomic History  . Marital status: Divorced    Spouse name: Not on file  . Number of children: Not on file  . Years of education: Not on file  . Highest education level: Not on file  Occupational History  . Not on file  Social Needs  . Financial resource strain: Not on file  . Food insecurity    Worry: Not on file     Inability: Not on file  . Transportation needs    Medical: Not on file    Non-medical: Not on file  Tobacco Use  . Smoking status: Current Every Day Smoker    Packs/day: 0.25    Years: 56.00    Pack years: 14.00    Types: E-cigarettes, Cigarettes  . Smokeless tobacco: Never Used  Substance and Sexual Activity  . Alcohol use: Yes    Alcohol/week: 3.0 standard drinks    Types: 3 Shots of liquor per week    Comment: nightly  . Drug use: Never  . Sexual activity: Yes    Birth control/protection: None  Lifestyle  . Physical activity    Days per week: Not on file    Minutes per session: Not on file  . Stress: Not on file  Relationships  . Social Herbalist on phone: Not on file    Gets together: Not on file    Attends religious service: Not on file    Active member of club or organization: Not on file    Attends meetings of clubs or organizations: Not on file    Relationship status: Not on file  . Intimate partner violence    Fear of current or ex partner: Not on file    Emotionally abused: Not on file    Physically abused: Not on file    Forced sexual activity: Not on file  Other Topics Concern  . Not on file  Social History Narrative  .  Not on file    Review of Systems: Unable to obtain review of systems as the patient is unresponsive  Physical Exam: Vital signs in last 24 hours: Temp:  [97.3 F (36.3 C)-98.9 F (37.2 C)] 98.1 F (36.7 C) (12/09 0400) Pulse Rate:  [59-66] 59 (12/09 0730) Resp:  [11-26] 23 (12/09 0730) BP: (81-108)/(39-74) 94/55 (12/09 0600) SpO2:  [94 %-100 %] 96 % (12/09 0730) Weight:  [84.1 kg-84.5 kg] 84.5 kg (12/09 0500) Last BM Date: 11/11/2019 General: Eyes open, patient is unresponsive Head:  Normocephalic and atraumatic. Eyes: Trace scleral icterus.  Conjunctiva pale. Nose:  No deformity, discharge or lesions. Mouth: Excessively dry mouth and tongue with patches of erythema to the upper palate and pharynx, poor dentition.  Neck:  Supple. Lungs: Coarse breath sounds throughout, diminished bilaterally Heart: Regular rate and rhythm, no murmur. Abdomen: Soft, mild amount of ascites present, the abdomen is not tense, lower abdominal bandage with evidence of dark blood.  Hypoactive bowel sounds to all 4 quadrants.  No obvious HSM. Rectal: Flexi-Seal draining watery yellowish stool. Msk:  Symmetrical without gross deformities.  Pulses: DP/PT pulses not palpable. Extremities: No significant lower extremity edema. Neurologic: Patient is unresponsive. Skin: Scattered patches of ecchymosis to his upper and lower extremities, upper extremities weeping serous fluid, left forearm bandage intact.  No jaundice. Psych:  Alert and cooperative. Normal mood and affect.  Intake/Output from previous day: 12/08 0701 - 12/09 0700 In: 9.4 [I.V.:9.4] Out: 800 [Stool:800] Intake/Output this shift: No intake/output data recorded.  Lab Results: Recent Labs    11/26/2019 0421 2019-11-24 0416  WBC 20.3* 22.2*  HGB 9.4* 8.6*  HCT 31.8* 29.7*  PLT 184 184   BMET Recent Labs    12/04/2019 0421 11-24-19 0416  NA 133* 134*  K 4.9 4.7  CL 90* 91*  CO2 31 32  GLUCOSE 133* 94  BUN 65* 71*  CREATININE 2.00* 2.39*  CALCIUM 7.4* 7.4*   LFT Recent Labs    November 24, 2019 0416  PROT 6.0*  ALBUMIN 2.6*  AST 103*  ALT 39  ALKPHOS 121  BILITOT 2.1*   PT/INR Recent Labs    11/24/2019 0416  LABPROT 15.3*  INR 1.2   Hepatitis Panel No results for input(s): HEPBSAG, HCVAB, HEPAIGM, HEPBIGM in the last 72 hours.  Studies/Results: Dg Chest Port 1 View  Result Date: 11/27/2019 CLINICAL DATA:  PICC placement. EXAM: PORTABLE CHEST 1 VIEW COMPARISON:  Same day. FINDINGS: Stable cardiomediastinal silhouette. Left-sided pacemaker is unchanged in position. Interval placement of right-sided PICC line with distal tip in expected position of the SVC. Stable bilateral lung opacities are noted. Bony thorax is unremarkable. IMPRESSION: Interval  placement of right-sided PICC line with distal tip in expected position of the SVC. Stable bilateral lung opacities are noted. Electronically Signed   By: Marijo Conception M.D.   On: 11/19/2019 16:13   Dg Chest Port 1 View  Result Date: 11/20/2019 CLINICAL DATA:  Shortness of breath. EXAM: PORTABLE CHEST 1 VIEW COMPARISON:  Radiograph 11/02/2019 FINDINGS: Left-sided pacemaker in place. Mild cardiomegaly with unchanged mediastinal contours. Aortic atherosclerosis. Vascular congestion without pulmonary edema. Left lung base opacity in left pleural effusion, not significantly changed. Patchy infrahilar opacities on the right. Fiducial markers in the right upper lobe spiculated lesion, unchanged. No pneumothorax. IMPRESSION: 1. No significant change since radiograph 11/02/2019. Left pleural effusion and associated atelectasis/airspace disease. 2. Mild cardiomegaly with stable vascular congestion. 3. Right upper lobe fiducial markers with underlying spiculated lesion. Aortic Atherosclerosis (ICD10-I70.0). Electronically  Signed   By: Keith Rake M.D.   On: 12/04/2019 04:23   [place] Ekg Site Rite  Result Date: 11/28/2019 If Site Rite image not attached, placement could not be confirmed due to current cardiac rhythm.   IMPRESSION/PLAN:  16.  69 year old male with decompensated cirrhosis.  No plans for paracentesis as the patient is hypotensive. -Conservative management -Agree with palliative care -Stop lactulose enemas -Further recommendations per Dr. Dorien Chihuahua later today -Repeat CBC and CMP in a.m.  2.  Acute respiratory failure secondary to CHF and COPD  3.  Leukocytosis of unclear etiology.  Patient is afebrile.  No plans for paracentesis to rule out SBP at this time.  4.  Acute renal failure.  BUN 71.  Creatinine 2.39.  Sodium 134.  Urine sodium less than 10.  Possible hepatorenal syndrome. -Consider renal consult -Dr. Dorien Chihuahua to determine if Octreotide appropriate at this time  5.  Normocytic  anemia.  Hemoglobin 8.6.  Hematocrit 29.7.  MCV 101.0.  Platelet 184.  No obvious signs of active GI bleeding at this time.  No recent EGD to assess for esophageal varices. -No plans for endoscopic evaluation at this time  Noralyn Pick  12/13/2019, 7:47 AM

## 2019-12-07 NOTE — Progress Notes (Signed)
Patient is comfort care. Family present in room. Pt is in no signs of distress. Resting comfortably with eyes closed. Levophed has been turned off. Family was told about chaplain on call. Family made aware of signs that death is imminent. They remain at bedside, tearful, but calm.

## 2019-12-07 NOTE — Progress Notes (Addendum)
PROGRESS NOTE [facility]   Shaun May  WPY:[id number]  DOB: July 02, 1951  DOA: 11/26/2019 PCP: Celene Squibb, MD   Brief Admission Hx: 69 year old male with COPD, chronic hypoxemia, paroxysmal atrial fibrillation not anticoagulated due to recurrent GI bleeding, chronic history of complete heart block with pacemaker, chronic venous insufficiency, stage IIIb CKD, heart failure preserved EF, alcoholic cirrhosis recently diagnosed, status post recent paracentesis and recent hospitalization for left leg cellulitis presented from SN[facility] with shortness of breath and worsening hypoxemia.  MDM/Assessment & Plan:   1. Acute on chronic hypoxemic respiratory failure secondary to acute diastolic heart failure-patient was initially placed on IV Lasix infusion but is discontinued now secondary to hypotension.  Monitoring intake and output.  His Covid testing and influenza testing has been negative.  2. Hypotension-likely secondary to chronic liver disease, he had a normal lactate on admission.  He continues to be hypotensive at this time.  We have placed a PICC line and started IV Levophed to maintain MAP greater than 65.  He is status post albumin infusion.  Temporarily holding Lasix infusion due to hypotension.  I have ordered a repeat lactic acid level. 3. Acute hepatic encephalopathy-ammonia level remains elevated, he remains encephalopathic and he has received lactulose enemas however has been discontinued due to hypotension.  GI has been consulted appreciate recommendations noted.  Patient appears critically ill and mortality remains very high.  We will continue to monitor closely.  I have asked for palliative medicine consultation for goals of care. 4. COPD with chronic hypoxemia-patient appears critically ill.  He is oxygenating well on nasal cannula at this time and we will continue to monitor closely for decompensation in the stepdown ICU. 5. Stage IIIb CKD-creatinine slightly up trending with IV  Lasix infusion which has been discontinued.  We will continue to monitor closely. 6. Paroxysmal atrial fibrillation/flutter with complete heart block and pacemaker-continue to monitor closely. 7. DNR-it was discussed with the patient's son regarding his overall prognosis and high mortality risk.  I have asked for a palliative medicine consult to consider transitioning to comfort care measures.   DVT prophylaxis: heparin  Code Status: DNR  Family Communication: updated son regarding change in condition, he is [place], will send patient's brother to see him at bedside, they are discussing starting full comfort care measures and will let us know. Disposition Plan: continue care in stepdown ICU  Consultants:  GI  Palliative medicine   Procedures:    Antimicrobials:     Subjective: Patient remains encephalopathic, unresponsive  Objective: Vitals:   December 10, 2019 0500 Dec 10, 2019 0600 10-Dec-2019 0730 2019-12-10 0754  BP: (!) 91/55 (!) 94/55    Pulse: (!) 59 (!) 59 (!) 59 (!) 59  Resp: 15 (!) 24 (!) 23 (!) 25  Temp:    (!) 97.3 F (36.3 C)  TempSrc:    Axillary  SpO2: 96% 94% 96% 95%  Weight: 84.5 kg     Height:        Intake/Output Summary (Last 24 hours) at 12/10/2019 1018 Last data filed at 12-10-2019 0200 Gross per 24 hour  Intake 9.39 ml  Output 800 ml  Net -790.61 ml   Filed Weights   12/03/2019 1408 12-10-2019 0500  Weight: 84.1 kg 84.5 kg   REVIEW OF SYSTEMS  UTO due to current condition   Exam:  General exam: awake, but encephalopathic and poorly responsive, appears terminal.  Respiratory system: shallow breathing. No increased work of breathing. Cardiovascular system: irregularly irregular, S1 &  S2 heard. Gastrointestinal system: Abdomen is nondistended, soft and nontender. Normal bowel sounds heard. Central nervous system: encephalopathic.  Extremities: no cyanosis, pretibial edema BLEs.  Data Reviewed: Basic Metabolic Panel: Recent Labs  Lab 11/17/2019 0421  12-07-19 0416  NA 133* 134*  K 4.9 4.7  CL 90* 91*  CO2 31 32  GLUCOSE 133* 94  BUN 65* 71*  CREATININE 2.00* 2.39*  CALCIUM 7.4* 7.4*  MG  --  1.5*   Liver Function Tests: Recent Labs  Lab 11/11/2019 0421 December 07, 2019 0416  AST 104* 103*  ALT 39 39  ALKPHOS 132* 121  BILITOT 2.1* 2.1*  PROT 6.3* 6.0*  ALBUMIN 2.5* 2.6*   No results for input(s): LIPASE, AMYLASE in the last 168 hours. Recent Labs  Lab 12/03/2019 0430 2019/12/07 0416  AMMONIA 61* 68*   CBC: Recent Labs  Lab 11/24/2019 0421 12/07/2019 0416  WBC 20.3* 22.2*  NEUTROABS 18.9*  --   HGB 9.4* 8.6*  HCT 31.8* 29.7*  MCV 100.0 101.0*  PLT 184 184   Cardiac Enzymes: No results for input(s): CKTOTAL, CKMB, CKMBINDEX, TROPONINI in the last 168 hours. CBG (last 3)  No results for input(s): GLUCAP in the last 72 hours. Recent Results (from the past 240 hour(s))  Respiratory Panel by RT PCR (Flu A&B, Covid) - Nasopharyngeal Swab     Status: None   Collection Time: 11/25/2019  4:30 AM   Specimen: Nasopharyngeal Swab  Result Value Ref Range Status   SARS Coronavirus 2 by RT PCR NEGATIVE NEGATIVE Final    Comment: (NOTE) SARS-CoV-2 target nucleic acids are NOT DETECTED. The SARS-CoV-2 RNA is generally detectable in upper respiratoy specimens during the acute phase of infection. The lowest concentration of SARS-CoV-2 viral copies this assay can detect is 131 copies/mL. A negative result does not preclude SARS-Cov-2 infection and should not be used as the sole basis for treatment or other patient management decisions. A negative result may occur with  improper specimen collection/handling, submission of specimen other than nasopharyngeal swab, presence of viral mutation(s) within the areas targeted by this assay, and inadequate number of viral copies (<131 copies/mL). A negative result must be combined with clinical observations, patient history, and epidemiological information. The expected result is Negative. Fact  Sheet for Patients:  PinkCheek.be Fact Sheet for Healthcare Providers:  GravelBags.it This test is not yet ap proved or cleared by the [place] FDA and  has been authorized for detection and/or diagnosis of SARS-CoV-2 by FDA under an Emergency Use Authorization (EUA). This EUA will remain  in effect (meaning this test can be used) for the duration of the COVID-19 declaration under Section 564(b)(1) of the Act, 21 U.S.C. section 360bbb-3(b)(1), unless the authorization is terminated or revoked sooner.    Influenza A by PCR NEGATIVE NEGATIVE Final   Influenza B by PCR NEGATIVE NEGATIVE Final    Comment: (NOTE) The Xpert Xpress SARS-CoV-2/FLU/RSV assay is intended as an aid in  the diagnosis of influenza from Nasopharyngeal swab specimens and  should not be used as a sole basis for treatment. Nasal washings and  aspirates are unacceptable for Xpert Xpress SARS-CoV-2/FLU/RSV  testing. Fact Sheet for Patients: PinkCheek.be Fact Sheet for Healthcare Providers: GravelBags.it This test is not yet approved or cleared by the [place] FDA and  has been authorized for detection and/or diagnosis of SARS-CoV-2 by  FDA under an Emergency Use Authorization (EUA). This EUA will remain  in effect (meaning this test can be used) for the duration of the  Covid-19 declaration under Section 564(b)(1) of the Act, 21  U.S.C. section 360bbb-3(b)(1), unless the authorization is  terminated or revoked. Performed at [facility], [address], [place], [place] [address]      Studies: Dg Chest [facility] 1 View  Result Date: 11/17/2019 CLINICAL DATA:  PICC placement. EXAM: PORTABLE CHEST 1 VIEW COMPARISON:  Same day. FINDINGS: Stable cardiomediastinal silhouette. Left-sided pacemaker is unchanged in position. Interval placement of right-sided PICC line with distal tip in expected position  of the SVC. Stable bilateral lung opacities are noted. Bony thorax is unremarkable. IMPRESSION: Interval placement of right-sided PICC line with distal tip in expected position of the SVC. Stable bilateral lung opacities are noted. Electronically Signed   By: Marijo Conception M.D.   On: 11/18/2019 16:13   Dg Chest Port 1 View  Result Date: 12/04/2019 CLINICAL DATA:  Shortness of breath. EXAM: PORTABLE CHEST 1 VIEW COMPARISON:  Radiograph 11/02/2019 FINDINGS: Left-sided pacemaker in place. Mild cardiomegaly with unchanged mediastinal contours. Aortic atherosclerosis. Vascular congestion without pulmonary edema. Left lung base opacity in left pleural effusion, not significantly changed. Patchy infrahilar opacities on the right. Fiducial markers in the right upper lobe spiculated lesion, unchanged. No pneumothorax. IMPRESSION: 1. No significant change since radiograph 11/02/2019. Left pleural effusion and associated atelectasis/airspace disease. 2. Mild cardiomegaly with stable vascular congestion. 3. Right upper lobe fiducial markers with underlying spiculated lesion. Aortic Atherosclerosis (ICD10-I70.0). Electronically Signed   By: Keith Rake M.D.   On: 11/19/2019 04:23   [place] Ekg Site Rite  Result Date: 12/03/2019 If Site Rite image not attached, placement could not be confirmed due to current cardiac rhythm.  Scheduled Meds:  budesonide (PULMICORT) nebulizer solution  0.25 mg Nebulization BID   Chlorhexidine Gluconate Cloth  6 each Topical Daily   heparin  5,000 Units Subcutaneous Q8H   lactulose  20 g Oral BID   lactulose  300 mL Rectal Daily   pantoprazole  40 mg Oral BID   sodium chloride flush  10-40 mL Intracatheter Q12H   sodium chloride flush  3 mL Intravenous Q12H   umeclidinium-vilanterol  1 puff Inhalation Daily   Continuous Infusions:  sodium chloride 250 mL (06-Dec-2019 0728)   furosemide (LASIX) infusion 4 mg/hr (11/25/2019 1813)   norepinephrine (LEVOPHED) Adult  infusion      Active Problems:   CHF exacerbation (HCC)   Pressure ulcer  Critical Care Procedure Note Authorized and Performed by: Murvin Natal MD  Total Critical Care time:  33 minutes  Due to a high probability of clinically significant, life threatening deterioration, the patient required my highest level of preparedness to intervene emergently and I personally spent this critical care time directly and personally managing the patient.  This critical care time included obtaining a history; examining the patient, pulse oximetry; ordering and review of studies; arranging urgent treatment with development of a management plan; evaluation of patient's response of treatment; frequent reassessment; and discussions with other providers.  This critical care time was performed to assess and manage the high probability of imminent and life threatening deterioration that could result in multi-organ failure.  It was exclusive of separately billable procedures and treating other patients and teaching time.    Irwin Brakeman, MD [facility] December 06, 2019, 10:18 AM    LOS: 1 day  How to contact the [facility] Attending or Consulting provider [place] or covering provider during after hours [place], for this patient?  1. Check the care team in [facility] and look for a)  attending/consulting [facility] provider listed and b) the [facility] team listed 2. Log into [email or web address] and use [place]'s universal password to access. If you do not have the password, please contact the hospital operator. 3. Locate the [facility] provider you are looking for under [facility] and page to a number that you can be directly reached. 4. If you still have difficulty reaching the provider, please page the [facility] (Director on Call) for the Hospitalists listed on amion for assistance.

## 2019-12-07 DEATH — deceased

## 2020-03-25 ENCOUNTER — Ambulatory Visit (INDEPENDENT_AMBULATORY_CARE_PROVIDER_SITE_OTHER): Payer: Medicare Other | Admitting: Internal Medicine

## 2020-09-09 IMAGING — US US EXTREM LOW VENOUS*L*
1 series · 13 of 24 positions shown · non-contrast
Comparison: None.

CLINICAL DATA: 68-year-old male with left lower extremity redness
and swelling for 1 week



[Series 1: us extrem low venous*left* · 0.08mm/px · 13 of 45 slices shown]
[im 1/45]
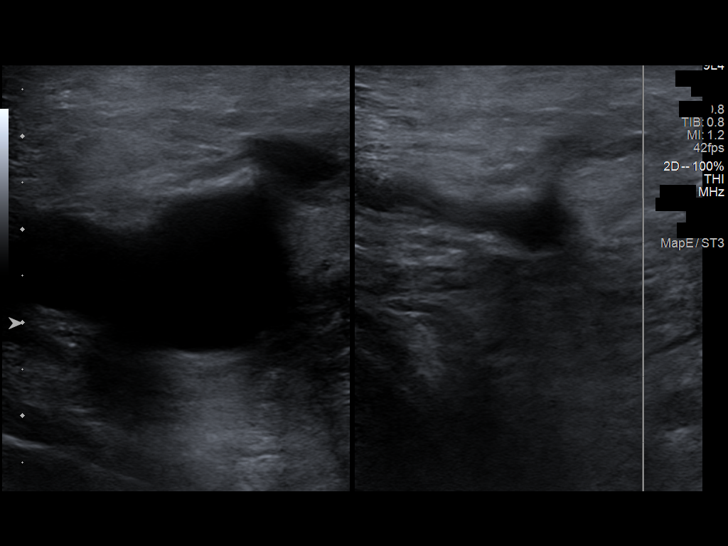
[im 4/45]
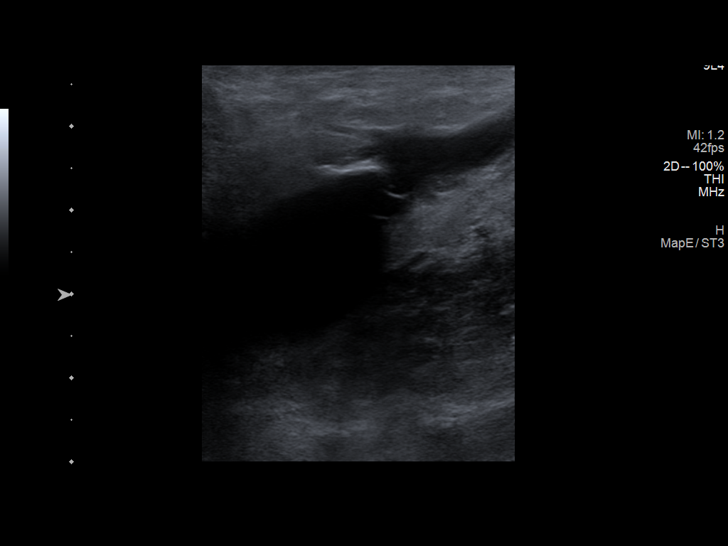
[im 8/45]
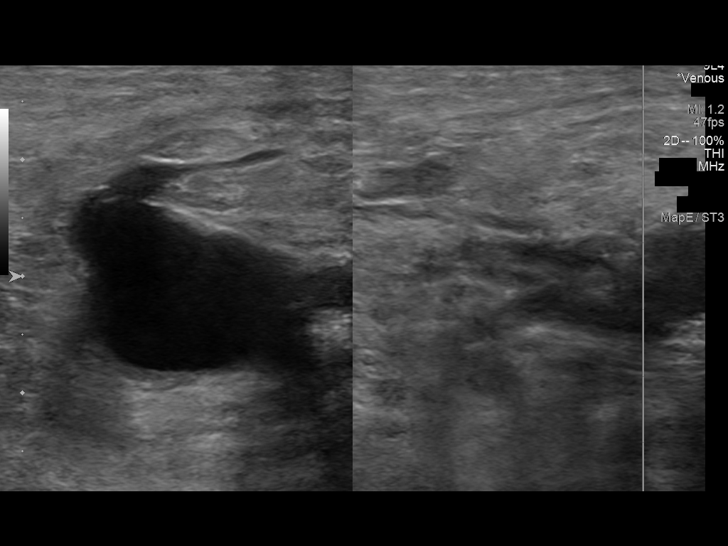
[im 12/45]
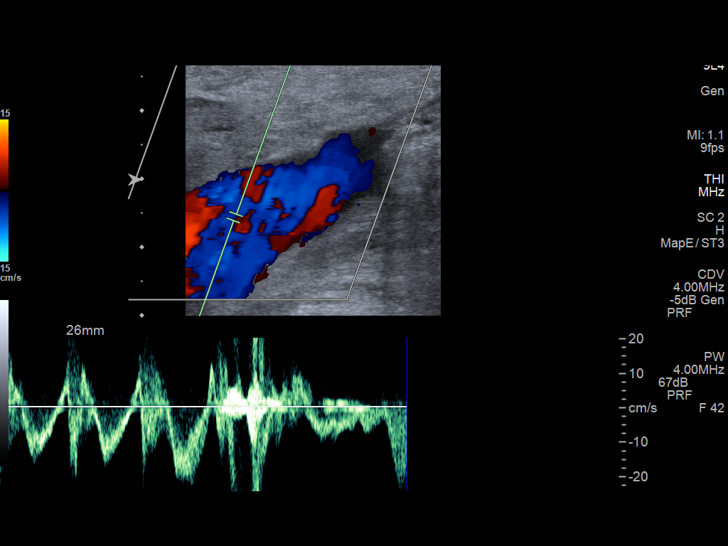
[im 16/45]
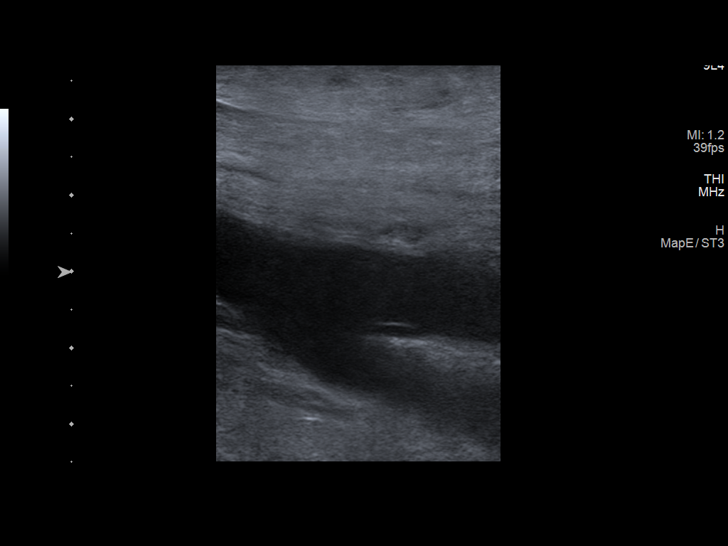
[im 20/45]
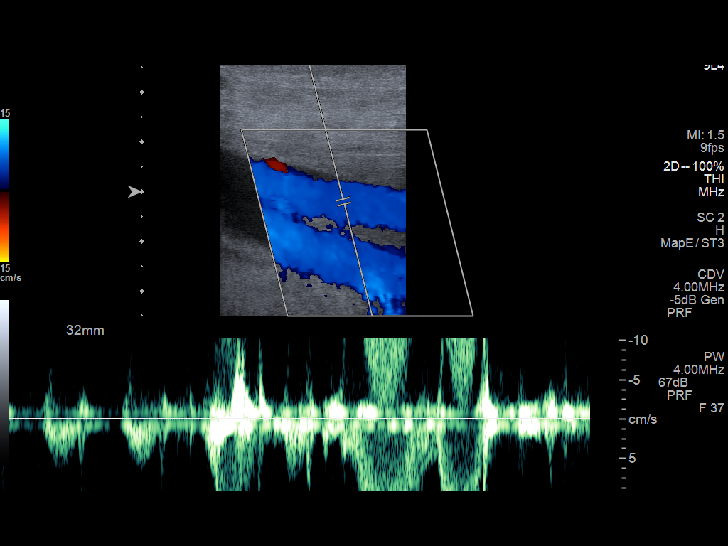
[im 23/45]
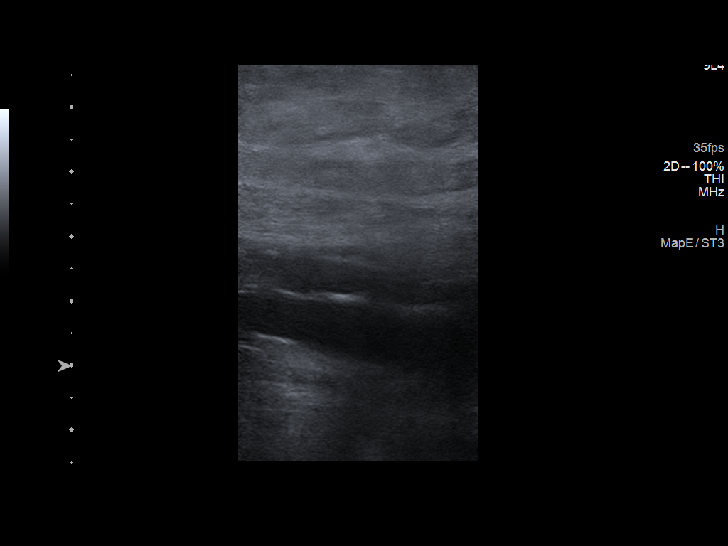
[im 25/45]
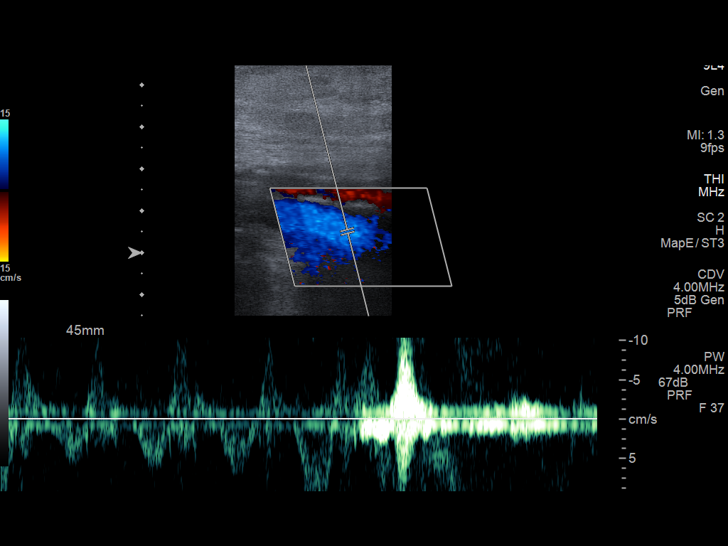
[im 29/45]
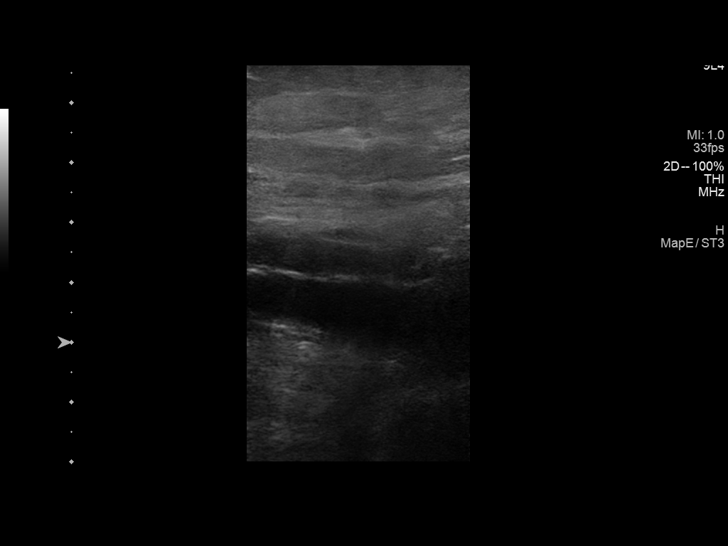
[im 33/45]
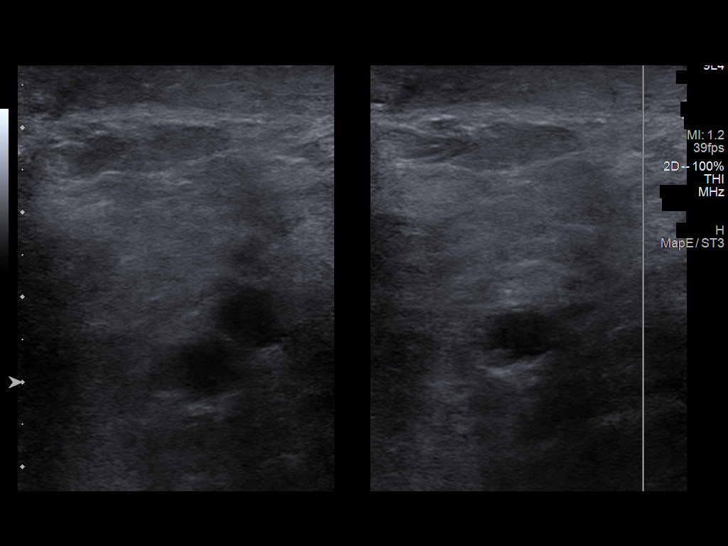
[im 37/45]
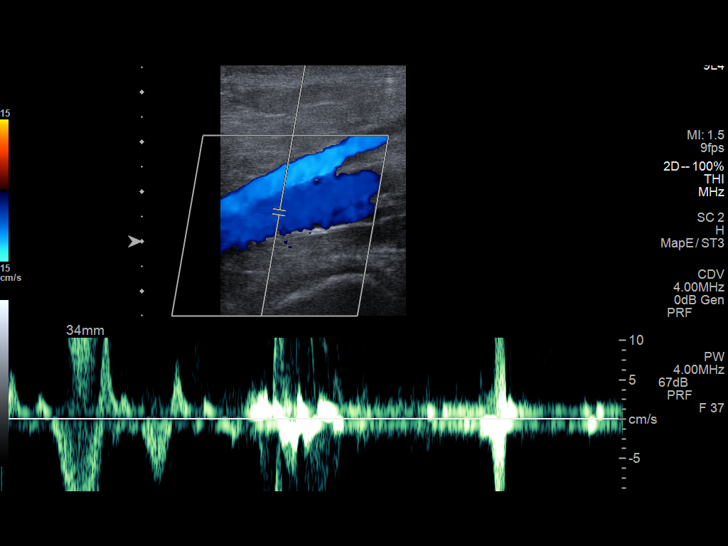
[im 41/45]
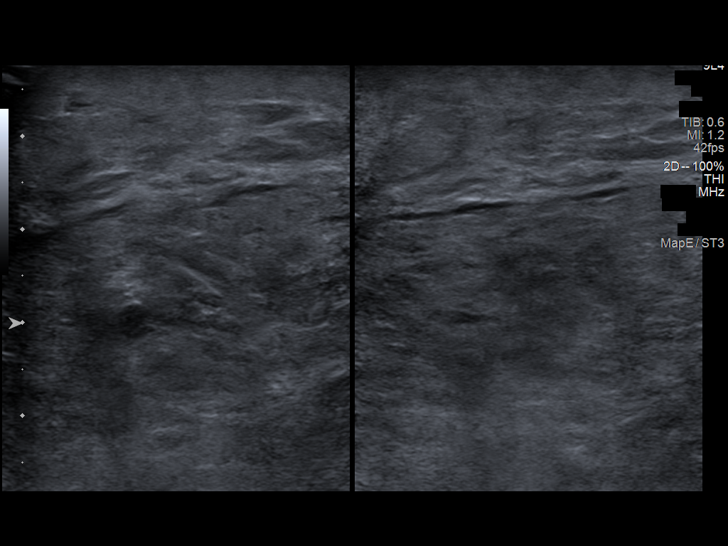
[im 45/45]
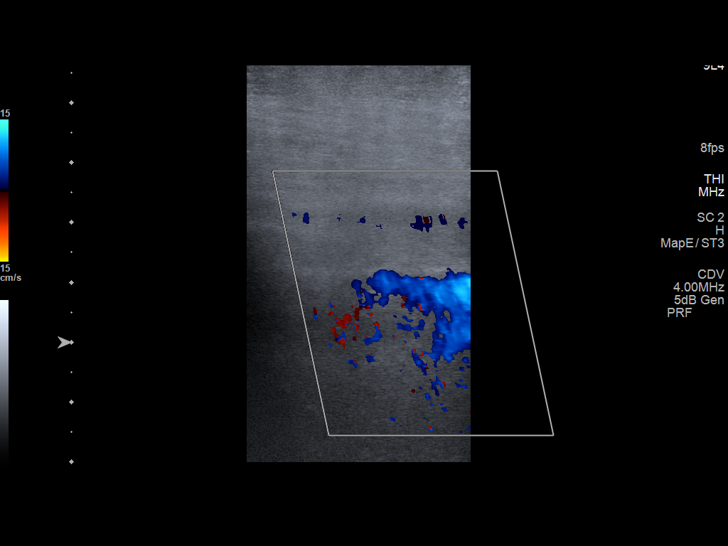

[13 of 24 positions shown; findings below may reference images not displayed]

FINDINGS: Contralateral Common Femoral Vein: Respiratory phasicity is normal
and symmetric with the symptomatic side. No evidence of thrombus.
Normal compressibility.

Common Femoral Vein: No evidence of thrombus. Normal
compressibility, respiratory phasicity and response to augmentation.

Saphenofemoral Junction: No evidence of thrombus. Normal
compressibility and flow on color Doppler imaging.

Profunda Femoral Vein: No evidence of thrombus. Normal
compressibility and flow on color Doppler imaging.

Femoral Vein: No evidence of thrombus. Normal compressibility,
respiratory phasicity and response to augmentation.

Popliteal Vein: No evidence of thrombus. Normal compressibility,
respiratory phasicity and response to augmentation.

Calf Veins: No evidence of thrombus. Normal compressibility and flow
on color Doppler imaging.

Superficial Great Saphenous Vein: No evidence of thrombus. Normal
compressibility.

Venous Reflux:  None.

Other Findings:  None.
IMPRESSION: No evidence of deep venous thrombosis.
